# Patient Record
Sex: Female | Born: 1935 | Race: White | Hispanic: No | Smoking: Never smoker
Health system: Southern US, Community
[De-identification: ages and names within clinical notes are randomized; demographics above are authoritative.]

## PROBLEM LIST (undated history)

## (undated) DIAGNOSIS — H269 Unspecified cataract: Secondary | ICD-10-CM

## (undated) DIAGNOSIS — K118 Other diseases of salivary glands: Secondary | ICD-10-CM

## (undated) DIAGNOSIS — F32A Depression, unspecified: Secondary | ICD-10-CM

## (undated) DIAGNOSIS — I82409 Acute embolism and thrombosis of unspecified deep veins of unspecified lower extremity: Secondary | ICD-10-CM

## (undated) DIAGNOSIS — M1990 Unspecified osteoarthritis, unspecified site: Secondary | ICD-10-CM

## (undated) DIAGNOSIS — G4700 Insomnia, unspecified: Secondary | ICD-10-CM

## (undated) DIAGNOSIS — I10 Essential (primary) hypertension: Secondary | ICD-10-CM

## (undated) DIAGNOSIS — K219 Gastro-esophageal reflux disease without esophagitis: Secondary | ICD-10-CM

## (undated) DIAGNOSIS — K259 Gastric ulcer, unspecified as acute or chronic, without hemorrhage or perforation: Secondary | ICD-10-CM

## (undated) DIAGNOSIS — K559 Vascular disorder of intestine, unspecified: Secondary | ICD-10-CM

## (undated) DIAGNOSIS — J449 Chronic obstructive pulmonary disease, unspecified: Secondary | ICD-10-CM

## (undated) DIAGNOSIS — C07 Malignant neoplasm of parotid gland: Secondary | ICD-10-CM

## (undated) DIAGNOSIS — F329 Major depressive disorder, single episode, unspecified: Secondary | ICD-10-CM

## (undated) HISTORY — PX: KNEE ARTHROPLASTY: SHX992

## (undated) HISTORY — DX: Other diseases of salivary glands: K11.8

## (undated) HISTORY — DX: Insomnia, unspecified: G47.00

## (undated) HISTORY — DX: Malignant neoplasm of parotid gland: C07

## (undated) HISTORY — PX: COLONOSCOPY: SHX174

## (undated) HISTORY — PX: TOTAL HIP ARTHROPLASTY: SHX124

## (undated) HISTORY — PX: ABDOMINAL HYSTERECTOMY: SHX81

## (undated) HISTORY — PX: BACK SURGERY: SHX140

## (undated) HISTORY — DX: Unspecified cataract: H26.9

## (undated) HISTORY — PX: OTHER SURGICAL HISTORY: SHX169

## (undated) HISTORY — PX: CHOLECYSTECTOMY: SHX55

---

## 1998-01-05 ENCOUNTER — Other Ambulatory Visit: Admission: RE | Admit: 1998-01-05 | Discharge: 1998-01-05 | Payer: Self-pay | Admitting: *Deleted

## 1998-02-09 ENCOUNTER — Other Ambulatory Visit: Admission: RE | Admit: 1998-02-09 | Discharge: 1998-02-09 | Payer: Self-pay | Admitting: *Deleted

## 1998-03-07 ENCOUNTER — Other Ambulatory Visit: Admission: RE | Admit: 1998-03-07 | Discharge: 1998-03-07 | Payer: Self-pay | Admitting: *Deleted

## 1998-05-03 ENCOUNTER — Ambulatory Visit (HOSPITAL_COMMUNITY): Admission: RE | Admit: 1998-05-03 | Discharge: 1998-05-03 | Payer: Self-pay | Admitting: *Deleted

## 1998-06-23 ENCOUNTER — Ambulatory Visit (HOSPITAL_COMMUNITY): Admission: RE | Admit: 1998-06-23 | Discharge: 1998-06-23 | Payer: Self-pay | Admitting: *Deleted

## 1998-07-19 ENCOUNTER — Emergency Department (HOSPITAL_COMMUNITY): Admission: EM | Admit: 1998-07-19 | Discharge: 1998-07-19 | Payer: Self-pay | Admitting: Emergency Medicine

## 1998-11-28 ENCOUNTER — Ambulatory Visit (HOSPITAL_COMMUNITY): Admission: RE | Admit: 1998-11-28 | Discharge: 1998-11-28 | Payer: Self-pay | Admitting: *Deleted

## 1999-02-28 ENCOUNTER — Encounter: Payer: Self-pay | Admitting: Emergency Medicine

## 1999-02-28 ENCOUNTER — Emergency Department (HOSPITAL_COMMUNITY): Admission: EM | Admit: 1999-02-28 | Discharge: 1999-03-01 | Payer: Self-pay | Admitting: Emergency Medicine

## 1999-03-15 ENCOUNTER — Encounter: Admission: RE | Admit: 1999-03-15 | Discharge: 1999-04-20 | Payer: Self-pay | Admitting: Family Medicine

## 1999-06-16 ENCOUNTER — Ambulatory Visit (HOSPITAL_COMMUNITY): Admission: RE | Admit: 1999-06-16 | Discharge: 1999-06-16 | Payer: Self-pay | Admitting: *Deleted

## 1999-08-10 ENCOUNTER — Other Ambulatory Visit: Admission: RE | Admit: 1999-08-10 | Discharge: 1999-08-10 | Payer: Self-pay | Admitting: *Deleted

## 1999-10-04 ENCOUNTER — Encounter: Payer: Self-pay | Admitting: Internal Medicine

## 1999-10-04 ENCOUNTER — Emergency Department (HOSPITAL_COMMUNITY): Admission: EM | Admit: 1999-10-04 | Discharge: 1999-10-04 | Payer: Self-pay | Admitting: Internal Medicine

## 1999-10-10 ENCOUNTER — Ambulatory Visit (HOSPITAL_COMMUNITY): Admission: RE | Admit: 1999-10-10 | Discharge: 1999-10-10 | Payer: Self-pay | Admitting: *Deleted

## 1999-10-25 ENCOUNTER — Ambulatory Visit (HOSPITAL_COMMUNITY): Admission: RE | Admit: 1999-10-25 | Discharge: 1999-10-25 | Payer: Self-pay | Admitting: Internal Medicine

## 1999-10-25 ENCOUNTER — Encounter: Payer: Self-pay | Admitting: Internal Medicine

## 2000-07-10 ENCOUNTER — Ambulatory Visit (HOSPITAL_COMMUNITY): Admission: RE | Admit: 2000-07-10 | Discharge: 2000-07-10 | Payer: Self-pay | Admitting: *Deleted

## 2000-08-29 ENCOUNTER — Emergency Department (HOSPITAL_COMMUNITY): Admission: EM | Admit: 2000-08-29 | Discharge: 2000-08-29 | Payer: Self-pay | Admitting: Internal Medicine

## 2000-08-29 ENCOUNTER — Encounter: Payer: Self-pay | Admitting: Emergency Medicine

## 2001-03-09 ENCOUNTER — Emergency Department (HOSPITAL_COMMUNITY): Admission: EM | Admit: 2001-03-09 | Discharge: 2001-03-09 | Payer: Self-pay | Admitting: Emergency Medicine

## 2001-07-17 ENCOUNTER — Encounter: Payer: Self-pay | Admitting: Internal Medicine

## 2001-07-17 ENCOUNTER — Ambulatory Visit (HOSPITAL_COMMUNITY): Admission: RE | Admit: 2001-07-17 | Discharge: 2001-07-17 | Payer: Self-pay | Admitting: Internal Medicine

## 2001-07-29 ENCOUNTER — Ambulatory Visit (HOSPITAL_COMMUNITY): Admission: RE | Admit: 2001-07-29 | Discharge: 2001-07-29 | Payer: Self-pay | Admitting: Internal Medicine

## 2001-07-29 ENCOUNTER — Encounter: Payer: Self-pay | Admitting: Internal Medicine

## 2001-08-03 ENCOUNTER — Emergency Department (HOSPITAL_COMMUNITY): Admission: EM | Admit: 2001-08-03 | Discharge: 2001-08-03 | Payer: Self-pay | Admitting: Emergency Medicine

## 2001-08-03 ENCOUNTER — Encounter: Payer: Self-pay | Admitting: Emergency Medicine

## 2001-08-07 ENCOUNTER — Encounter (INDEPENDENT_AMBULATORY_CARE_PROVIDER_SITE_OTHER): Payer: Self-pay | Admitting: Specialist

## 2001-08-07 ENCOUNTER — Ambulatory Visit (HOSPITAL_COMMUNITY): Admission: RE | Admit: 2001-08-07 | Discharge: 2001-08-07 | Payer: Self-pay | Admitting: Gastroenterology

## 2001-08-07 ENCOUNTER — Encounter (INDEPENDENT_AMBULATORY_CARE_PROVIDER_SITE_OTHER): Payer: Self-pay | Admitting: *Deleted

## 2001-12-12 ENCOUNTER — Inpatient Hospital Stay (HOSPITAL_COMMUNITY): Admission: EM | Admit: 2001-12-12 | Discharge: 2001-12-16 | Payer: Self-pay

## 2001-12-14 ENCOUNTER — Encounter: Payer: Self-pay | Admitting: *Deleted

## 2002-03-25 ENCOUNTER — Encounter: Payer: Self-pay | Admitting: Gastroenterology

## 2002-03-25 ENCOUNTER — Inpatient Hospital Stay (HOSPITAL_COMMUNITY): Admission: EM | Admit: 2002-03-25 | Discharge: 2002-03-28 | Payer: Self-pay | Admitting: Emergency Medicine

## 2002-07-06 ENCOUNTER — Encounter: Admission: RE | Admit: 2002-07-06 | Discharge: 2002-07-06 | Payer: Self-pay | Admitting: *Deleted

## 2002-07-06 ENCOUNTER — Encounter: Payer: Self-pay | Admitting: *Deleted

## 2002-08-03 ENCOUNTER — Ambulatory Visit (HOSPITAL_COMMUNITY): Admission: RE | Admit: 2002-08-03 | Discharge: 2002-08-03 | Payer: Self-pay | Admitting: *Deleted

## 2002-08-03 ENCOUNTER — Encounter: Admission: RE | Admit: 2002-08-03 | Discharge: 2002-08-03 | Payer: Self-pay | Admitting: *Deleted

## 2002-08-03 ENCOUNTER — Encounter: Payer: Self-pay | Admitting: *Deleted

## 2002-09-02 ENCOUNTER — Ambulatory Visit (HOSPITAL_COMMUNITY): Admission: RE | Admit: 2002-09-02 | Discharge: 2002-09-02 | Payer: Self-pay | Admitting: *Deleted

## 2002-09-07 ENCOUNTER — Ambulatory Visit (HOSPITAL_COMMUNITY): Admission: RE | Admit: 2002-09-07 | Discharge: 2002-09-07 | Payer: Self-pay | Admitting: *Deleted

## 2002-09-07 ENCOUNTER — Encounter: Admission: RE | Admit: 2002-09-07 | Discharge: 2002-09-07 | Payer: Self-pay | Admitting: *Deleted

## 2002-09-07 ENCOUNTER — Encounter: Payer: Self-pay | Admitting: *Deleted

## 2003-06-03 ENCOUNTER — Encounter: Admission: RE | Admit: 2003-06-03 | Discharge: 2003-06-03 | Payer: Self-pay | Admitting: Orthopaedic Surgery

## 2003-06-15 ENCOUNTER — Encounter: Admission: RE | Admit: 2003-06-15 | Discharge: 2003-08-10 | Payer: Self-pay | Admitting: Orthopaedic Surgery

## 2003-07-09 ENCOUNTER — Emergency Department (HOSPITAL_COMMUNITY): Admission: EM | Admit: 2003-07-09 | Discharge: 2003-07-09 | Payer: Self-pay | Admitting: Emergency Medicine

## 2003-07-20 ENCOUNTER — Ambulatory Visit (HOSPITAL_COMMUNITY): Admission: RE | Admit: 2003-07-20 | Discharge: 2003-07-20 | Payer: Self-pay | Admitting: Internal Medicine

## 2003-08-30 ENCOUNTER — Emergency Department (HOSPITAL_COMMUNITY): Admission: EM | Admit: 2003-08-30 | Discharge: 2003-08-31 | Payer: Self-pay | Admitting: Emergency Medicine

## 2004-05-07 ENCOUNTER — Emergency Department (HOSPITAL_COMMUNITY): Admission: EM | Admit: 2004-05-07 | Discharge: 2004-05-07 | Payer: Self-pay | Admitting: Emergency Medicine

## 2004-07-03 ENCOUNTER — Ambulatory Visit (HOSPITAL_COMMUNITY): Admission: RE | Admit: 2004-07-03 | Discharge: 2004-07-03 | Payer: Self-pay | Admitting: Internal Medicine

## 2004-11-29 ENCOUNTER — Emergency Department (HOSPITAL_COMMUNITY): Admission: EM | Admit: 2004-11-29 | Discharge: 2004-11-29 | Payer: Self-pay | Admitting: Emergency Medicine

## 2004-12-01 ENCOUNTER — Inpatient Hospital Stay (HOSPITAL_COMMUNITY): Admission: EM | Admit: 2004-12-01 | Discharge: 2004-12-06 | Payer: Self-pay | Admitting: Emergency Medicine

## 2004-12-26 ENCOUNTER — Encounter: Admission: RE | Admit: 2004-12-26 | Discharge: 2004-12-26 | Payer: Self-pay | Admitting: Gastroenterology

## 2005-04-22 ENCOUNTER — Emergency Department (HOSPITAL_COMMUNITY): Admission: EM | Admit: 2005-04-22 | Discharge: 2005-04-22 | Payer: Self-pay | Admitting: *Deleted

## 2005-04-26 ENCOUNTER — Emergency Department (HOSPITAL_COMMUNITY): Admission: EM | Admit: 2005-04-26 | Discharge: 2005-04-26 | Payer: Self-pay | Admitting: Emergency Medicine

## 2005-05-02 ENCOUNTER — Emergency Department (HOSPITAL_COMMUNITY): Admission: EM | Admit: 2005-05-02 | Discharge: 2005-05-02 | Payer: Self-pay | Admitting: Emergency Medicine

## 2005-09-17 ENCOUNTER — Emergency Department (HOSPITAL_COMMUNITY): Admission: EM | Admit: 2005-09-17 | Discharge: 2005-09-17 | Payer: Self-pay | Admitting: Emergency Medicine

## 2005-11-27 ENCOUNTER — Encounter: Payer: Self-pay | Admitting: Emergency Medicine

## 2005-11-28 ENCOUNTER — Ambulatory Visit: Payer: Self-pay | Admitting: Cardiology

## 2005-11-28 ENCOUNTER — Inpatient Hospital Stay (HOSPITAL_COMMUNITY): Admission: EM | Admit: 2005-11-28 | Discharge: 2005-12-04 | Payer: Self-pay

## 2005-11-29 ENCOUNTER — Encounter: Payer: Self-pay | Admitting: Cardiology

## 2006-06-04 ENCOUNTER — Ambulatory Visit (HOSPITAL_COMMUNITY): Admission: RE | Admit: 2006-06-04 | Discharge: 2006-06-04 | Payer: Self-pay | Admitting: Internal Medicine

## 2006-06-04 ENCOUNTER — Encounter: Payer: Self-pay | Admitting: Vascular Surgery

## 2006-11-18 ENCOUNTER — Emergency Department (HOSPITAL_COMMUNITY): Admission: EM | Admit: 2006-11-18 | Discharge: 2006-11-18 | Payer: Self-pay | Admitting: Emergency Medicine

## 2007-01-07 ENCOUNTER — Encounter: Admission: RE | Admit: 2007-01-07 | Discharge: 2007-02-03 | Payer: Self-pay | Admitting: Orthopaedic Surgery

## 2007-02-04 ENCOUNTER — Encounter: Admission: RE | Admit: 2007-02-04 | Discharge: 2007-03-10 | Payer: Self-pay | Admitting: Orthopaedic Surgery

## 2007-03-11 ENCOUNTER — Encounter: Admission: RE | Admit: 2007-03-11 | Discharge: 2007-04-17 | Payer: Self-pay | Admitting: Orthopaedic Surgery

## 2007-10-12 ENCOUNTER — Ambulatory Visit: Payer: Self-pay | Admitting: Cardiology

## 2007-10-13 ENCOUNTER — Ambulatory Visit: Payer: Self-pay | Admitting: Cardiology

## 2007-10-13 ENCOUNTER — Inpatient Hospital Stay (HOSPITAL_COMMUNITY): Admission: EM | Admit: 2007-10-13 | Discharge: 2007-10-17 | Payer: Self-pay | Admitting: *Deleted

## 2007-10-16 ENCOUNTER — Encounter (INDEPENDENT_AMBULATORY_CARE_PROVIDER_SITE_OTHER): Payer: Self-pay | Admitting: Internal Medicine

## 2007-10-17 ENCOUNTER — Encounter (INDEPENDENT_AMBULATORY_CARE_PROVIDER_SITE_OTHER): Payer: Self-pay | Admitting: *Deleted

## 2007-10-17 HISTORY — PX: ESOPHAGOGASTRODUODENOSCOPY: SHX1529

## 2007-10-27 ENCOUNTER — Ambulatory Visit: Payer: Self-pay

## 2007-11-06 ENCOUNTER — Ambulatory Visit: Payer: Self-pay | Admitting: Cardiology

## 2007-11-27 ENCOUNTER — Emergency Department (HOSPITAL_COMMUNITY): Admission: EM | Admit: 2007-11-27 | Discharge: 2007-11-28 | Payer: Self-pay | Admitting: Emergency Medicine

## 2008-03-23 ENCOUNTER — Ambulatory Visit: Payer: Self-pay | Admitting: Cardiology

## 2008-03-29 ENCOUNTER — Encounter: Admission: RE | Admit: 2008-03-29 | Discharge: 2008-06-27 | Payer: Self-pay | Admitting: Internal Medicine

## 2008-06-21 ENCOUNTER — Encounter (INDEPENDENT_AMBULATORY_CARE_PROVIDER_SITE_OTHER): Payer: Self-pay | Admitting: Internal Medicine

## 2008-06-21 ENCOUNTER — Observation Stay (HOSPITAL_COMMUNITY): Admission: EM | Admit: 2008-06-21 | Discharge: 2008-06-28 | Payer: Self-pay | Admitting: Emergency Medicine

## 2008-06-22 ENCOUNTER — Ambulatory Visit: Payer: Self-pay | Admitting: Vascular Surgery

## 2008-06-22 ENCOUNTER — Encounter (INDEPENDENT_AMBULATORY_CARE_PROVIDER_SITE_OTHER): Payer: Self-pay | Admitting: Internal Medicine

## 2008-07-14 ENCOUNTER — Emergency Department (HOSPITAL_COMMUNITY): Admission: EM | Admit: 2008-07-14 | Discharge: 2008-07-14 | Payer: Self-pay | Admitting: Emergency Medicine

## 2008-07-14 ENCOUNTER — Encounter (INDEPENDENT_AMBULATORY_CARE_PROVIDER_SITE_OTHER): Payer: Self-pay | Admitting: Emergency Medicine

## 2008-07-14 ENCOUNTER — Ambulatory Visit: Payer: Self-pay | Admitting: Vascular Surgery

## 2008-11-12 ENCOUNTER — Ambulatory Visit: Payer: Self-pay | Admitting: Cardiology

## 2008-11-12 LAB — CONVERTED CEMR LAB
BUN: 10 mg/dL (ref 6–23)
Calcium: 9 mg/dL (ref 8.4–10.5)
Creatinine, Ser: 0.7 mg/dL (ref 0.4–1.2)
GFR calc Af Amer: 103 mL/min

## 2009-01-06 DIAGNOSIS — F339 Major depressive disorder, recurrent, unspecified: Secondary | ICD-10-CM

## 2009-01-06 DIAGNOSIS — K589 Irritable bowel syndrome without diarrhea: Secondary | ICD-10-CM

## 2009-01-06 DIAGNOSIS — M1990 Unspecified osteoarthritis, unspecified site: Secondary | ICD-10-CM

## 2009-01-06 DIAGNOSIS — K219 Gastro-esophageal reflux disease without esophagitis: Secondary | ICD-10-CM | POA: Insufficient documentation

## 2009-01-06 DIAGNOSIS — J449 Chronic obstructive pulmonary disease, unspecified: Secondary | ICD-10-CM

## 2009-01-06 DIAGNOSIS — I10 Essential (primary) hypertension: Secondary | ICD-10-CM | POA: Insufficient documentation

## 2009-01-06 DIAGNOSIS — D649 Anemia, unspecified: Secondary | ICD-10-CM

## 2009-01-10 ENCOUNTER — Ambulatory Visit: Payer: Self-pay | Admitting: Cardiology

## 2009-01-10 ENCOUNTER — Encounter: Payer: Self-pay | Admitting: Cardiology

## 2009-05-31 ENCOUNTER — Encounter (INDEPENDENT_AMBULATORY_CARE_PROVIDER_SITE_OTHER): Payer: Self-pay | Admitting: *Deleted

## 2009-05-31 ENCOUNTER — Ambulatory Visit (HOSPITAL_COMMUNITY): Admission: RE | Admit: 2009-05-31 | Discharge: 2009-05-31 | Payer: Self-pay | Admitting: Otolaryngology

## 2009-07-19 ENCOUNTER — Ambulatory Visit: Payer: Self-pay | Admitting: Gastroenterology

## 2009-10-15 ENCOUNTER — Emergency Department (HOSPITAL_COMMUNITY): Admission: EM | Admit: 2009-10-15 | Discharge: 2009-10-15 | Payer: Self-pay | Admitting: Emergency Medicine

## 2009-10-19 ENCOUNTER — Ambulatory Visit (HOSPITAL_COMMUNITY): Admission: RE | Admit: 2009-10-19 | Discharge: 2009-10-19 | Payer: Self-pay | Admitting: Emergency Medicine

## 2010-04-03 ENCOUNTER — Ambulatory Visit: Payer: Self-pay | Admitting: Cardiology

## 2010-10-14 ENCOUNTER — Encounter: Payer: Self-pay | Admitting: Gastroenterology

## 2010-10-15 ENCOUNTER — Encounter: Payer: Self-pay | Admitting: Internal Medicine

## 2010-10-15 ENCOUNTER — Encounter: Payer: Self-pay | Admitting: Emergency Medicine

## 2010-11-29 ENCOUNTER — Other Ambulatory Visit (HOSPITAL_COMMUNITY): Payer: Self-pay | Admitting: Internal Medicine

## 2010-11-29 ENCOUNTER — Ambulatory Visit (HOSPITAL_COMMUNITY)
Admission: RE | Admit: 2010-11-29 | Discharge: 2010-11-29 | Disposition: A | Discharge status: Home / Self Care | Payer: Medicare Other | Source: Ambulatory Visit | Attending: Internal Medicine | Admitting: Internal Medicine

## 2010-11-29 DIAGNOSIS — R05 Cough: Secondary | ICD-10-CM

## 2010-11-29 DIAGNOSIS — W19XXXA Unspecified fall, initial encounter: Secondary | ICD-10-CM | POA: Insufficient documentation

## 2010-11-29 DIAGNOSIS — R079 Chest pain, unspecified: Secondary | ICD-10-CM | POA: Insufficient documentation

## 2010-11-29 DIAGNOSIS — R059 Cough, unspecified: Secondary | ICD-10-CM | POA: Insufficient documentation

## 2010-12-10 LAB — PROTIME-INR: Prothrombin Time: 20.2 seconds — ABNORMAL HIGH (ref 11.6–15.2)

## 2010-12-20 ENCOUNTER — Emergency Department (HOSPITAL_COMMUNITY): Payer: Medicare Other

## 2010-12-20 ENCOUNTER — Emergency Department (HOSPITAL_COMMUNITY)
Admission: EM | Admit: 2010-12-20 | Discharge: 2010-12-20 | Disposition: A | Discharge status: Home / Self Care | Payer: Medicare Other | Attending: Emergency Medicine | Admitting: Emergency Medicine

## 2010-12-20 DIAGNOSIS — Y92009 Unspecified place in unspecified non-institutional (private) residence as the place of occurrence of the external cause: Secondary | ICD-10-CM | POA: Insufficient documentation

## 2010-12-20 DIAGNOSIS — S0100XA Unspecified open wound of scalp, initial encounter: Secondary | ICD-10-CM | POA: Insufficient documentation

## 2010-12-20 DIAGNOSIS — W010XXA Fall on same level from slipping, tripping and stumbling without subsequent striking against object, initial encounter: Secondary | ICD-10-CM | POA: Insufficient documentation

## 2010-12-20 DIAGNOSIS — S0990XA Unspecified injury of head, initial encounter: Secondary | ICD-10-CM | POA: Insufficient documentation

## 2010-12-20 DIAGNOSIS — T45515A Adverse effect of anticoagulants, initial encounter: Secondary | ICD-10-CM | POA: Insufficient documentation

## 2010-12-20 DIAGNOSIS — M542 Cervicalgia: Secondary | ICD-10-CM | POA: Insufficient documentation

## 2010-12-20 DIAGNOSIS — M549 Dorsalgia, unspecified: Secondary | ICD-10-CM | POA: Insufficient documentation

## 2010-12-20 DIAGNOSIS — R791 Abnormal coagulation profile: Secondary | ICD-10-CM | POA: Insufficient documentation

## 2010-12-20 DIAGNOSIS — T1490XA Injury, unspecified, initial encounter: Secondary | ICD-10-CM | POA: Insufficient documentation

## 2010-12-20 DIAGNOSIS — Z86718 Personal history of other venous thrombosis and embolism: Secondary | ICD-10-CM | POA: Insufficient documentation

## 2010-12-20 DIAGNOSIS — Z7901 Long term (current) use of anticoagulants: Secondary | ICD-10-CM | POA: Insufficient documentation

## 2010-12-20 DIAGNOSIS — M47812 Spondylosis without myelopathy or radiculopathy, cervical region: Secondary | ICD-10-CM | POA: Insufficient documentation

## 2010-12-20 DIAGNOSIS — I10 Essential (primary) hypertension: Secondary | ICD-10-CM | POA: Insufficient documentation

## 2010-12-20 DIAGNOSIS — M79609 Pain in unspecified limb: Secondary | ICD-10-CM | POA: Insufficient documentation

## 2010-12-20 LAB — DIFFERENTIAL
Basophils Relative: 0 % (ref 0–1)
Eosinophils Absolute: 0.1 10*3/uL (ref 0.0–0.7)
Lymphs Abs: 0.9 10*3/uL (ref 0.7–4.0)
Neutro Abs: 4.9 10*3/uL (ref 1.7–7.7)
Neutrophils Relative %: 69 % (ref 43–77)

## 2010-12-20 LAB — CBC
Hemoglobin: 10.6 g/dL — ABNORMAL LOW (ref 12.0–15.0)
MCV: 89.2 fL (ref 78.0–100.0)
Platelets: 315 10*3/uL (ref 150–400)
RBC: 3.7 MIL/uL — ABNORMAL LOW (ref 3.87–5.11)
WBC: 7.1 10*3/uL (ref 4.0–10.5)

## 2010-12-20 LAB — BASIC METABOLIC PANEL
Calcium: 8.7 mg/dL (ref 8.4–10.5)
Creatinine, Ser: 0.93 mg/dL (ref 0.4–1.2)
GFR calc Af Amer: >60 mL/min (ref >60)
Sodium: 139 mEq/L (ref 135–145)

## 2010-12-20 LAB — APTT: aPTT: 96 seconds — ABNORMAL HIGH (ref 24–37)

## 2010-12-20 LAB — PROTIME-INR: Prothrombin Time: 44.8 seconds — ABNORMAL HIGH (ref 11.6–15.2)

## 2011-02-06 NOTE — Assessment & Plan Note (Signed)
[facility]                            CARDIOLOGY OFFICE NOTE   NAME:[name], [name] LAGACE                   MRN:          [id number]  DATE:03/23/2008                            DOB:          09/25/1924    [name] [name] is in for followup visit.  In general, she is stable.  The  patient underwent myocardial perfusion imaging back in February 2009.  This particular study was read as a low-risk nuclear study with a defect  in the anterior wall suggesting some breast attenuation.  Fortunately,  she has not had any recurrent chest pain.  She was originally admitted,  and had ischemic colitis.  She has not had any recurrent symptoms since  I last saw her.  She is not felt to be a Coumadin candidate.   Her current medications include benazepril 20 mg daily, multivitamin,  fish oil, Macular eyes, amitriptyline, omeprazole 20 mg daily,  ropinirole and meloxicam.   On physical; blood pressure is 144/67, pulse 81, lung fields are clear,  and the cardiac exam is, otherwise, unremarkable.   Importantly, the patient does have a history of ischemic colitis and  DVT.  She is on a nonsteroidal and we discussed this briefly.  We will  continue her on current medical regimen and I will see her back in  followup 6 months unless further problems arise.       Arturo Morton. Riley Kill, MD, [facility]  Electronically Signed    TDS/MedQ  DD: 04/09/2008  DT: 04/10/2008  Job #: [id number]

## 2011-02-06 NOTE — Consult Note (Signed)
NAMEADDISYNN, [name] [name] NO.:  [id number]   MEDICAL RECORD NO.:  [id number]          PATIENT TYPE:  INP   LOCATION:  5039                         FACILITY:  [facility]   PHYSICIAN:  Danise Edge, M.D.   DATE OF BIRTH:  06-07-1925   DATE OF CONSULTATION:  10/13/2007  DATE OF DISCHARGE:                                 CONSULTATION   ADMISSION DIAGNOSIS:  Hematochezia with left lower quadrant abdominal  pain suspicious for ischemic colitis.   HISTORY:  Ms. [name] [name] is an 77 year old female born July 24, 1925.  Ms. [name] [name] was admitted to [facility] October 12, 2007  following the acute onset of hematochezia with predominantly left lower  quadrant abdominal discomfort.  Prior to the episode, she had been  feeling well.  She denies severe abdominal pain.   Ms. [name] [name] has an allergy to LATEX and it would be hazardous to  undergo a CT scan of her abdomen and pelvis.   She does have a history of ischemic colitis which healed without  surgery.  She has undergone colonoscopic exams in 1999 and in 2002.   Ms. [name] [name] was placed on antibiotics for suspected ischemic colitis.  Her bleeding has stopped.  There has been no significant fall in her  hemoglobin which was approximately 11.5 grams on admission.   MEDICATION ALLERGIES:  1. PENICILLIN.  2. LATEX.  3. CONTRAST.   PAST MEDICAL/SURGICAL HISTORY:  1. 2007 placement of inferior vena cava filter due to deep venous      thrombosis.  2. Hypertension.  3. Normocytic anemia.  4. Osteoarthritis.  5. Chronic obstructive pulmonary disease.  6. Gastroesophageal reflux disease.  7. Depression.  8. Irritable bowel syndrome.  9. History of ischemic colitis.  10.History of stroke.  11.Cataract surgery.  12.Lumbar laminectomy.  13.Bilateral hip replacement surgeries.  14.Bilateral knee replacement surgeries.  15.Hysterectomy.  16.Cholecystectomy.   HABITS:  Ms. [name] [name] does not smoke cigarettes or  consume alcohol.   RECOMMENDATIONS:  I agree with Ms. [name] [name] current therapy which  includes stopping the aspirin and Plavix and placing her on  ciprofloxacin and Flagyl for suspected ischemic colitis.  I do not  recommend colonoscopy or diagnostic flexible proctosigmoidoscopy due to  the increased risk of perforation.           ______________________________  Danise Edge, M.D.     MJ/MEDQ  D:  10/13/2007  T:  10/13/2007  Job:  [id number]

## 2011-02-06 NOTE — H&P (Signed)
[name] [name], [name] NO.:  [id number]   MEDICAL RECORD NO.:  [id number]          PATIENT TYPE:  INP   LOCATION:  5039                         FACILITY:  [facility]   PHYSICIAN:  [name] [name], M.D.   DATE OF BIRTH:  Mar 18, 1925   DATE OF ADMISSION:  10/12/2007  DATE OF DISCHARGE:                              HISTORY & PHYSICAL   PRIMARY CARE PHYSICIAN:  [name] [name], D.O.  Gastroenterologist, Dr.  Randa [name].   CHIEF COMPLAINT:  Bloody stools.   HISTORY OF PRESENT ILLNESS:  The patient is an 77 year old female with  past medical history of ischemic colitis and multiple bouts of  intermittent hematochezia who presents to the emergency department today  with a 24-hour history of recurrent bloody stools.  The patient states  that she thinks that over the past 24 hours, she has lost two gallons of  blood in her stool.  She also complained of left lower quadrant  abdominal pain and abdominal cramping prior to evacuating her bowels.  She denies any fever or chills.  Appetite has been normal.  She is being  admitted for further evaluation and workup.   PAST MEDICAL HISTORY:  1. Deep venous thrombosis with history of IVC filter placement in      March 2007.  2. Hypertension.  3. Normocytic anemia.  4. Osteoarthritis.  5. Chronic obstructive pulmonary disease.  6. Gastroesophageal reflux disease.  7. Depression.  8. Irritable bowel syndrome.  9. Ischemic colitis.  10.History of stroke.   PAST SURGICAL HISTORY:  1. Cataract surgery.  2. Status post lumbar laminectomy.  3. Bilateral hip replacements.  4. Bilateral knee replacements.  5. Hysterectomy.  6. Cholecystectomy.   FAMILY HISTORY:  The patient's mother and father both died in their 63s  from old age.  Father had pneumonia at his time of death.  She has a  sister who died of colon cancer.  She has three healthy offspring.   SOCIAL HISTORY:  The patient is widowed and lives alone.  She does have  an  active Life Alert system.  She is a lifelong nonsmoker.  She denies  alcohol.  She was a homemaker.   ALLERGIES:  PENICILLIN AND LATEX.   MEDICATIONS:  1. Darvocet-N 100 1 tablet q.4 h. p.r.n.  2. Benazepril 20 mg daily.  3. Aspirin 325 mg daily.  4. Amitriptyline 225 mg nightly.  5. Mobic 7.5 mg b.i.d.  6. Hydroxyzine 25 mg q.6 h. p.r.n.  7. Cyclobenzaprine 10 mg q.6 h. p.r.n.  8. Temazepam 30 mg nightly p.r.n.   REVIEW OF SYSTEMS:  The patient reports occasional chest pain.  She  denies shortness of breath or cough.  She denies dysuria.  Otherwise, as  noted in the elements of the HPI above.  No nausea or vomiting.   PHYSICAL EXAMINATION:  VITAL SIGNS:  Temperature 99.1, pulse 90,  respirations 18, blood pressure 103/54, O2 saturation 95% on 2 L.  GENERAL:  Well-developed, well-nourished, obese female in no acute  distress.  HEENT:  Normocephalic, atraumatic.  PERRL.  EOMI.  Oropharynx is clear  with dry mucous membranes and  dentures to the upper palate.  NECK:  Supple, no thyromegaly, no lymphadenopathy, no jugular venous  distention.  CHEST:  Lungs clear to auscultation bilaterally with good air movement.  HEART:  Regular rate, rhythm.  There is a soft grade 2/6 systolic  ejection murmur at the left upper sternal border.  ABDOMEN:  Soft, tender in the left lower quadrant and flank.  No rebound  or guarding.  RECTAL:  Done by ED physician, reportedly heme-positive stool.  EXTREMITIES:  No clubbing, edema, cyanosis.  She does have TED hose on.  SKIN:  Warm and dry.  No rashes.  NEUROLOGIC:  The patient is alert and oriented x3.  Cranial nerves 2-12  grossly intact with exception of some mild hearing impairment.  Moves  all extremities x4 with equal strength.   LABORATORY DATA AND X-RAY FINDINGS:  Sodium is 136, potassium 4.3,  chloride 100, bicarb 29, BUN 16, creatinine 0.94, glucose 113.  Total  bilirubin 0.8, alkaline phosphatase 115, AST 25, ALT less than 8, total   protein 6, albumin 3.2.  Coagulation studies are normal.  White blood  cell count 14.1, hemoglobin 11.8, hematocrit 34.8, platelets 238 with an  MCV of 94 and absolute neutrophil count of 11.   ASSESSMENT/PLAN:  1. Hematochezia secondary to ischemic colitis flare.  The patient's      lower gastrointestinal bleed is likely from recurrent ischemic      colitis.  Will admit the patient and monitor her hemoglobin and      hematocrit q.8 h. x3.  Will type and cross and give packed red      blood cells as needed for any significant drop in her hemoglobin.      This time, I will hold her aspirin and Mobic.  Given her relative      hypotension, I will hold her ACE inhibitor as well.  Given her      leukocytosis, I will empirically cover her with Cipro and Flagyl.  2. History of deep venous thrombosis.  We will place the patient on      PAS hose.  3. Hypertension:  The patient is actually mildly hypotensive and      therefore, we will hold her antihypertensive medication.  4. Osteoarthritis.  Will administer pain medications as needed.  5. Prophylaxis.  Administer a proton pump inhibitor for      gastrointestinal prophylaxis and use PAS hose for deep venous      thrombosis prophylaxis.      [name] [name], M.D.  Electronically Signed     CR/MEDQ  D:  10/13/2007  T:  10/13/2007  Job:  [id number]   cc:   [name] [name]., M.D.  [name] [name], D.O.

## 2011-02-06 NOTE — H&P (Signed)
NAME:  [name] [name], [name] [name] NO.:  [id number]   MEDICAL RECORD NO.:  [id number]          PATIENT TYPE:  OBV   LOCATION:  1831                         FACILITY:  [facility]   PHYSICIAN:  Ladell Pier, M.D.   DATE OF BIRTH:  [birth year]   DATE OF ADMISSION:  06/21/2008  DATE OF DISCHARGE:                              HISTORY & PHYSICAL   CHIEF COMPLAINT:  She has severe pain in both hips, both knees, and her  calf, mostly on the right to the point that she is not able to walk.  She feels that she hurts all over, her shoulders hurt as well,  especially the right.   HISTORY OF PRESENT ILLNESS:  The patient is an 77 year old white female,  past medical history significant for arthritis, hypertension, normocytic  anemia, depression, GERD, recent admission with ischemic colitis.  The  patient was brought to the emergency room by her family secondary to  weakness.  She states that she has been having weakness in both legs.  She stated that both hips hurt and she can barely walk secondary to  severe pain in her hips and her knees.  She has had both hips and both  knees replaced.  She states she also has back pain but she has had the  back pain for 40 years and it is about the same.  She complains of no  chest pain, no shortness of breath, no headache, no neck pain.  She said  normally she had some pain, but over the years it has gotten  progressively worse to the point that she could barely walk now.  She  lives alone.   PAST MEDICAL HISTORY:  Significant for:  1. Hypertension.  2. Normocytic anemia.  3. Osteoarthritis.  4. Chronic obstructive pulmonary disease.  5. GERD.  6. Depression.  7. Irritable bowel syndrome.  8. Ischemic colitis in January 2009.  9. History of stroke.  10.History of DVT with history of IVC filter on the right in March      2007.   PAST SURGICAL HISTORY:  1. Cataract surgery.  2. Status post lumbar laminectomy.  3. Bilateral hip replacement.  4.  Bilateral knee replacement.  5. Hysterectomy.  6. Cholecystectomy.   FAMILY HISTORY:  Both parents died from old age.   SOCIAL HISTORY:  She is a widow.  She lives alone.  She does not smoke.  No alcohol use.  She was a homemaker.  She has three grown children, she  said, in their 35s.   MEDICATIONS:  The patient states that her family took her medications  home and she is not sure what medications they are.  Per the list from  the documentation of the ER:  1. Aspirin 325 mg daily.  2. Amitriptyline 225 mg at bedtime.  3. Hydroxyzine 25 mg four times daily as needed.  4. Cyclobenzaprine 10 mg every 6 hours as needed.  5. Temazepam 30 mg at bedtime as needed.  6. Requip 1 mg daily.  7. Omeprazole 20 mg daily.  8. Tramadol 50-100 mg as needed.  9. Meloxicam 7.5 mg twice daily.  10.Amlodipine 5 mg daily.   ALLERGIES:  PENICILLIN.   REVIEW OF SYSTEMS:  Negative, otherwise stated in the HPI.   PHYSICAL EXAMINATION:  VITAL SIGNS:  Temperature 97.0, blood pressure  128/65, pulse of 72, respirations 20, pulse ox 94% on room air.  HEENT: Normocephalic, atraumatic.  Pupils equal, round, and reactive to  light, throat without erythema.  CARDIOVASCULAR:  Regular rate and rhythm.  LUNGS:  Clear bilaterally.  ABDOMEN:  Abdomen soft, nontender, nondistended.  Positive bowel sounds.  EXTREMITIES: She has a 1+ to 2+ pitting edema bilaterally.  She is  tender in the right calf. Strength is symmetric 5/5 throughout.  NEURO: Cranial nerves II-XII seem to be intact.  The patient does wear  dentures and she does not have her bottom dentures in so her speech is a  little muffled secondary to that, but no facial asymmetry.  Her finger-  to-nose intact.   LABORATORY DATA:  WBC 9.8, hemoglobin 11.4, MCV 93.3, platelets of 192.  Sodium 134, potassium 4.2, chloride 100, CO2 26, glucose 114, BUN 14,  creatinine 0.79, calcium 9.4, albumin 3.7, AST 27.  UA large leukocyte  with 7-10 WBCs, few  bacteria.   X-ray of the pelvis shows no fracture, and x-ray of the right shoulder  no significant fracture, just arthritis.  Chest x-ray showed chronic  eventration of the right hemidiaphragm, no acute findings.   ASSESSMENT AND PLAN:  1. Generalized weakness.  2. Bilateral hip and knee pain.  3. Right calf pain.  4. Osteoarthritis.  5. Hypertension.  6. Normocytic anemia.  7. Depression.  8. Gastroesophageal reflux disease.  9. Pyuria.   We will admit the patient to the hospital.  We will get Doppler  ultrasound of bilateral lower extremity.  Will also get an ABI.  Will  get an x-ray of the LS spine and will control her pain with medication.  The patient was also consult social work.  She may need placement if  everything turns out to be fine.  Will consult physical  therapy/occupational therapy.  The patient lives alone and maybe the  reason for the pain is just an increase in the severity of her arthritis  with her age, but we will treat her for urinary tract infection with  Cipro.  Will check a urine culture and will get all the studies.  She  does not have any chest pain or shortness of breath, but will cycle her  enzymes and check an EKG secondary to her complaints of weakness.  She  recently saw her cardiologist back in June.      Ladell Pier, M.D.  Electronically Signed     NJ/MEDQ  D:  06/21/2008  T:  06/21/2008  Job:  [id number]   cc:   Lovenia Kim, D.O.

## 2011-02-06 NOTE — Letter (Signed)
November 06, 2007    [name] [name], D.O.  [address]  [place], [place] [address]   RE:  [name], [name]  MRN:  [id number]  /  DOB:  September 16, 1925   Dear [name] [name]:   I had the pleasure seeing [name] [name] in the office today in follow-  up visit.  She was hospitalized recently, as you know.  She was thought  to present with ischemic colitis.  She had had some intermittent  hematochezia, and I think this accounts for why her antiplatelet agents  were held.  She also had gastric erosions noted.  She was treated  medically.  Since discharge from the hospital she has been getting along  reasonably well and she does tell me there are laboratory studies were  checked in your office.  Currently, she is feeling better.  She is  getting around.  She did undergo myocardial perfusion imaging study that  was done with adenosine.  This was felt to be a relatively low risk  stress nuclear study with a mild reversible defect in the anterior wall  suggestive of shifting breast attenuation.  Mild ischemia could not be  excluded.  Ejection fraction was calculated 65%.   Her current medications include benazepril 20 mg daily, amitriptyline 25  mg q.h.s. Protonix 40 mg b.i.d., multivitamin daily and fish oil 1200 mg  a day.   EXAM:  She is well in appearance.  Blood pressure is 138/60 the pulse was 80.  The lung fields are clear.  The cardiac rhythm is regular.   The recent EKG at rest prior to her radionuclide imaging study was  completely within normal limits.   To summarize, she seems to be doing better.  Much of her chest  discomfort has completely resolved.  She denies any ongoing symptoms at  the present time.  Given her age, and low-risk ischemic study, my  inclination would be to continue to treat her medically.  I will defer  the reinstitution of Plavix to your discretion since her laboratory  studies have been obtained in your office and you have closer follow-up  with  her.  We will plan to see her back in follow-up in 3 months.  If  she should have increasing chest pain, please do not hesitate to let us  know.  At some point, consideration of cardiac catheterization might be  warranted, but in the absence of symptoms with a low risk nuclear study,  my inclination would be to leave this alone at the present time.  Finally, I am aware that she has an indwelling IVC filter.  At the time  of her hospitalization she was not deemed to be a Coumadin candidate so  I would certainly leave this to your discretion.    Sincerely,      [name] [name]. [name] Kill, MD, [facility]  Electronically Signed    TDS/MedQ  DD: 11/06/2007  DT: 11/07/2007  Job #: [id number]

## 2011-02-06 NOTE — Discharge Summary (Signed)
NAMEEARLENE, [name] [name] NO.:  [id number]   MEDICAL RECORD NO.:  [id number]          PATIENT TYPE:  INP   LOCATION:  5039                         FACILITY:  [facility]   PHYSICIAN:  Isidor Holts, M.D.  DATE OF BIRTH:  1925-01-13   DATE OF ADMISSION:  10/12/2007  DATE OF DISCHARGE:  10/17/2007                               DISCHARGE SUMMARY   PMD:  Dr. Marisue Brooklyn.   PRIMARY GASTROENTEROLOGIST:  Dr. Randa Evens.   DISCHARGE DIAGNOSES:  1. Ischemic colitis.  2. Atypical chest pain.  3. Gastric erosion/gastroesophageal reflux disease.  4. History of chronic obstructive pulmonary disease.  5. Hypertension.  6. History of depression.  7. Osteoarthritis.  8. History of previous deep venous thromboses status post IVC filter      March 2007.  9. History of irritable bowel syndrome.   DISCHARGE MEDICATIONS:  1. Darvocet-N 100 one p.o. p.r.n. q.4-6 h.  2. Benazepril 10 mg daily.  3. Amitriptyline 225 mg p.o. q.h.s.  4. Protonix 40 mg twice daily.  5. Cyclobenzaprine 10 mg p.o. p.r.n. q.6 h.  6. Temazepam 30 mg p.o. p.r.n. q.h.s.  7. Hydroxyzine 25 mg p.o. p.r.n. q.6 h.   Note:  Aspirin and Mobic have been discontinued, until reevaluated by  primary M.D.   PROCEDURE:  1. Abdominal/chest x-ray dated October 13, 2007.  This was low volume      chest film with vascular crowding and atelectasis.  Findings      suggest small bowel obstruction, no free air.  2. 2D echocardiogram dated October 16, 2007.  This showed overall      moderately decreased left ventricular systolic function.  EF was      50%, severe hypokinesis to akinesis of the inferoposterior wall .      There was mild fibrocalcific change of aortic root, mild mitral      annular calcification, mild mitral regurgitation.  Left atrium was      mildly dilated, estimated peak right ventricle systolic pressure      was slightly increased.  Inferior vena cava was dilated.  3. EGD done October 17, 2007 that showed  isolated erosion at      gastroesophageal junction (40 cm). Stomach normal.  Duodenum      normal.   CONSULTATIONS:  1. Dr. Riley Kill, cardiologist.  2. Dr. Laural Benes, gastroenterologist.   ADMISSION HISTORY:  As H&P notes on October 12, 2007 dictated by Dr.  Hillery Aldo.  However, in brief, this is an 77 year old female, with  known history of DVT status post IVC filter March 2007, hypertension,  normocytic anemia, osteoarthritis, COPD, gastroesophageal reflux  disease, depression, irritable bowel syndrome, prior history of ischemic  colitis, prior history of stroke, status post bilateral hip and knee  replacement, status post lumbar laminectomy, status post hysterectomy  and cholecystectomy as well as cataract surgery who presents with a 24-  hour history of recurrent bloody stools.  She was admitted for  evaluation and investigation and management.   CLINICAL COURSE:  1. Ischemic colitis.  The patient has a known history of ischemic      colitis presenting with  intermittent hematochezia in the past.  It      is felt that this was once again, the culprit on this occasion.      She was commenced on combination of Ciprofloxacin and Flagyl, and      her NSAID's were held.  GI consultation was kindly provided by Dr.      Laural Benes who was very helpful in the management of this case.  The      patient's symptoms ameliorated with above mentioned management      measures including bowel rest and intravenous fluids.  By October 15, 2007 she no longer had any bowel symptoms.  Flagyl and      Ciprofloxacin were discontinued on October 16, 2007, without any      deleterious effects.   1. Gastric erosion.  The patient on October 15, 2007 complained of      chest pain which she states had commenced about two days prior.  In      addition she also complained of heartburn.  It was felt that she      did indeed have risk factors for coronary artery disease.  However,      a GI cause could not  be excluded.  Cardiology consultation was      called.  For further details see below.  Patient was commenced on      twice daily proton pump inhibitor.  EGD was done on October 17, 2007, and this showed an erosion at gastroesophageal junction;      otherwise, negative.  She has been recommended continued proton      pump inhibitor treatment.  NSAID's will be on hold, until reviewed      by primary M.D.   1. Atypical chest pain.  The patient as mentioned above, complained of      chest pain.  A 12-lead EKG was unremarkable for acute ischemic      changes.  Cardiac enzymes remained unelevated.  However, in view of      the fact that she was felt to have risk factors for coronary artery      disease, given her prior history of cerebrovascular disease, age      and hypertension, a cardiology consult was called, and was kindly      provided by Dr. Riley Kill.  She underwent 2D echocardiogram on      October 16, 2007 which showed LVEF of 50% as well as inferior      hypokinesis/akinesis.  Cardiologist has recommended an outpatient      stress Myoview which will be scheduled, for further risk      stratification.   1. Hypertension.  The patient does have a known history of      hypertension.  However, at the time of her initial presentation BP      was borderline at 103/54.  Lotensin was held temporarily, but we      were subsequently were able to reinstate this, without any      deleterious affects.  She remained normotensive throughout the      course of her hospitalization, thereafter.   1. Normocytic anemia.  The patient has a known history of normocytic      anemia.  Her hemoglobin remained stable between 10.3 and 7.8 during      the course of this hospitalization.   1. History of depression.  The patient's mood was stable throughout  the course of her hospitalization, on pre-admission antidepressant      medication.   1. History of COPD.  There were no problems referable to  this, during      course of patient's hospitalization.   DISPOSITION:  The patient was on October 17, 2007, considered  sufficiently clinically recovered and stable for discharge.  She has  therefore been discharged accordingly.   DIET:  Heart healthy.   ACTIVITY:  As tolerated.   FOLLOWUP INSTRUCTIONS:  The patient is recommended to follow up with her  primary M.D., Dr. Marisue Brooklyn, within one week. She is to call for an  appointment.  She is also to follow up with Dr. Carman Ching,  gastroenterologist, in one month.  In addition she is to follow up with  Dr. Riley Kill of [facility] for outpatient stress test, at a date  to be determined.   SPECIAL INSTRUCTIONS:  The patient has been recommended to discontinue  Aspirin and Mobic, until reevaluated by primary M.D. which hopefully  should be within one week.      Isidor Holts, M.D.  Electronically Signed     CO/MEDQ  D:  10/17/2007  T:  10/17/2007  Job:  [id number]   cc:   Lovenia Kim, D.O.  Llana Aliment Malon Kindle., M.D.  Arturo Morton. Riley Kill, MD, [facility]

## 2011-02-06 NOTE — Consult Note (Signed)
[name] [name], [name] [name] NO.:  [id number]   MEDICAL RECORD NO.:  [id number]          PATIENT TYPE:  INP   LOCATION:  5039                         FACILITY:  [facility]   PHYSICIAN:  Arturo Morton. Riley Kill, MD, FACCDATE OF BIRTH:  October 02, 1924   DATE OF CONSULTATION:  DATE OF DISCHARGE:                                 CONSULTATION   CHIEF COMPLAINT:  Chest pain.   HISTORY OF PRESENT ILLNESS:  [name] [name] is an 77 year old who presents  to the emergency department with a 24 hour history of bloody stools.  She thought she lost about 2 gallons of blood.  She also complains of  left lower quadrant abdominal pain.  She apparently has a history of  ischemic colitis in the past, and this was thought to be due to that.  The patient is on aspirin and Plavix which has subsequently been  stopped.  She has a prior history of DVT with subsequent filter  placement.  She was on warfarin prior to that time.  This was stopped  about 3 years ago.  In reviewing her history, she was scheduled to see  Dr. Eden Emms.  She has had an increasing history of some recurrent  discomfort for the past year.  She says it comes on at any time, but  seems to be even worse after eating even to the point where she has to  some extent at times stopped eating.  It lasts less than a minute.  It  is not associated with diaphoresis or significant shortness of breath,  but is rated at about 5/10.  She also describes some burning tenderness  in the mid epigastrium.  She has taken Pepto-Bismol for this in the  past.  She says that she does feel like burping and belching at times  and it does seem to help.   ALLERGIES:  INCLUDE LATEX AND PENICILLIN.   MEDICATIONS:  Prior to admission, she was on Darvocet 100 mg every 4  hours, benazepril 20 mg daily, aspirin 325 mg daily, amitriptyline  nightly, Mobic 75 mg b.i.d., hydroxy 25 every 6 hours, cyclobenzaprine  10 every 6 hours, temazepam 30 mg every 8 hours, Plavix 75  mg.   PAST MEDICAL HISTORY:  Remarkable for a DVT with a history of inferior  vena cava filter placement in March of 2007.  She has hypertension,  history of normocytic anemia, and COPD.  She also has had depression,  IBS, ischemic colitis, history of stroke, rhabdomyolysis with a fall.   PAST SURGICAL HISTORY:  Include cataracts, status post lumbar  laminectomy, bilateral hip replacement, bilateral knee replacement,  hysterectomy, cholecystectomy.   SOCIAL HISTORY:  The patient lives in [place].  She is widowed.  She has  3 children.  She is a retired Futures trader.  She does not smoke.   FAMILY HISTORY:  Mother died in 30s of old age, father in 87s of old  age.  She has a sibling who has colon cancer.   REVIEW OF SYSTEMS:  Remarkable for some sweats at night.  She has had  this intermittent chest pain that radiates to the epigastrium  and rarely  to the jaws.  She has had bright red blood per rectum.  She has a little  bit of cold intolerance.  An 8 point review of systems is otherwise  negative.   PHYSICAL EXAMINATION:  GENERAL APPEARANCE:  She is alert and oriented.  She is in no acute distress and appears somewhat elderly.  She answers  questions easily.  VITAL SIGNS:  Blood pressure is 120/65, respiratory rate 18, pulse 75,  temperature 97.7, 93% on room air.  NECK:  There are no obvious carotid bruits.  There is no obvious jugular  venous distention.  CARDIOVASCULAR:  The PMI is nondisplaced.  There is normal first and  second heart sound and no significant murmurs, rubs, or gallops.  ABDOMEN:  Soft, but there is tenderness in the epigastric area.  There  are no obvious masses noted.  LUNGS:  Fields are clear to auscultation and percussion.  SKIN:  There are multiple seborrheic keratoses.  NEUROLOGIC:  Nonfocal.   LABORATORIES:  The electrocardiogram demonstrates a normal sinus rhythm.  There is nonspecific ST abnormality, but otherwise unremarkable.   BUN is 16, creatinine  0.94.  Liver functions are normal.  Albumin is  normal.  Hemoglobin is depressed to 10.7 with a hematocrit of 31.5,  platelet count is 189,000.   IMPRESSION:  1. History of some recurrent chest pain, worse recently.  2. History of gastroesophageal symptoms with pain with burping,      belching, and also difficulty eating with some dysphagia.  3. Multiple cardiac risk factors.  4. Other problems as outlined above.   RECOMMENDATIONS:  We did an EKG today which was nonischemic.  I would  get a 2-D echocardiogram.  I would also consider doing an upper  endoscopy and some consideration might be given to discontinue her  nonsteroidal anti-inflammatory drugs.  Finally, it would not be  unreasonable to do an adenosine Cardiolite, but currently she is not an  interventional candidate with bleeding.  Therefore, would recommend  doing the upper endoscopy first prior to any further cardiac workup.  We  will follow the patient with you.      Arturo Morton. Riley Kill, MD, [facility]  Electronically Signed     TDS/MEDQ  D:  10/15/2007  T:  10/16/2007  Job:  [id number]

## 2011-02-06 NOTE — Assessment & Plan Note (Signed)
[facility]                            CARDIOLOGY OFFICE NOTE   NAME:[name] [name], [name] [name]                   MRN:          [id number]  DATE:11/12/2008                            DOB:          06/20/1925    [name] [name] is in for followup.  She is out of a nursing home and  amazingly, is driving herself.  She has not had a lot of chest pain.  She just has an occasional discomfort in the midchest.  The patient has  a history of DVT.  She has a filter in and is thought not to be a  Coumadin candidate.  She has been followed by Dr. Marisue Brooklyn.  She  was admitted to the hospital in October.  The patient also has a number  of things including the scoliosis and advanced multilevel degenerative  disk disease.  Of note, when she was ready for discharge from the  hospital, her potassium was down to 3.0.  We did not see her during this  hospitalization.  It is not clear to me that if she has had any followup  on this.  I had last seen her in consultation in February 2009, and she  had a nuclear study done in the office, which was read as a low stress  study.  There was some breast attenuation, but looking at the images,  there was pretty good and uniform uptake in all myocardial segments.  The patient has also had a 2-D echocardiogram demonstrating an ejection  fraction in the range of 50% with some hypokinesis with akinesis of the  inferoposterior wall, but there is not a definite finding of this on the  nuclear scan.   CURRENT MEDICATIONS:  1. Multivitamin daily.  2. Fish oil 1200 mg daily.  3. Gabapentin 300 mg b.i.d.  4. Amlodipine 5 mg daily.  5. Citalopram 20 mEq a day.  6. Hydrochlorothiazide 12.5 daily.   On physical, the blood pressure is 150/70, the pulse is 71.  The lung  fields are clear and the cardiac rhythm is regular.   We will go ahead based on the previous laboratory studies and get a  basic metabolic profile on her.  She does not think  that her symptoms  have advanced much, and based on this and her low-risk nuclear study, we  will not recommend any type of catheterization study at the present  time.  Her current EKG is completely within normal limits.  Should there  be a change in pattern, she knows to contact us.  I have encouraged her  to continue to follow up with Dr. Marisue Brooklyn and we will get a basic  profile today.     Arturo Morton. Riley Kill, MD, [facility]  Electronically Signed    TDS/MedQ  DD: 11/12/2008  DT: 11/13/2008  Job #: [id number]

## 2011-02-06 NOTE — Op Note (Signed)
NAMEELANIE, HAMMITT NO.:  [id number]   MEDICAL RECORD NO.:  [id number]          PATIENT TYPE:  INP   LOCATION:  5039                         FACILITY:  [facility]   PHYSICIAN:  Danise Edge, M.D.   DATE OF BIRTH:  December 08, 1924   DATE OF PROCEDURE:  10/17/2007  DATE OF DISCHARGE:                               OPERATIVE REPORT   PROCEDURE INDICATION:  Ms. Kanani Mowbray is an 77 year old female  born 1925/02/11.  Ms. Quilter has chronic heartburn associated  with a normal esophagogastroduodenoscopy performed in 1998.   She has undergone two colonoscopic exams which documented ischemic  colitis involving her left colon.  Her last colonoscopy was August 07, 2001 performed by Dr. Carman Ching.   Ms. Morissette was actually admitted to the hospital with hematochezia and  left lower quadrant abdominal pain suspicious for ischemic colitis.  She  responded to antibiotic therapy and her bleeding stopped.   During her hospitalization she was experiencing bouts of chest pain and  heartburn.  She has been evaluated by cardiology.  An  esophagogastroduodenoscopy is scheduled to rule out erosive esophagitis.   ENDOSCOPIST:  Danise Edge, M.D.   PREMEDICATION:  Fentanyl 50 mcg, Versed 6 mg.   PROCEDURE:  After obtaining informed consent Ms. Adkison was placed in  the left lateral decubitus position.  I administered intravenous  fentanyl and intravenous Versed to achieve conscious sedation for the  procedure.  The patient's blood pressure, oxygen saturation and cardiac  rhythm were monitored throughout the procedure and documented in the  medical record.   The Pentax gastroscope was passed through the posterior hypopharynx into  the proximal esophagus without difficulty.  The hypopharynx, larynx and  vocal cords appeared normal.   Esophagoscopy:  The proximal and mid segments of the esophagus appeared  normal.  The squamocolumnar junction is noted at  approximately 40 cm  from the incisor teeth.  There is an isolated superficial erosion at the  esophagogastric junction.  Otherwise the esophagus appears completely  normal.  There is no endoscopic evidence for the presence of esophageal  tumors, esophageal scarring or Barrett's esophagus.   Gastroscopy:  Retroflex view of the gastric cardia and fundus was  normal.  I do not detect any significant hiatal hernia.  The gastric  body, antrum and pylorus appear normal.   Duodenoscopy:  The duodenal bulb and descending duodenum appeared  normal.   ASSESSMENT:  Chronic heartburn and atypical chest pain associated with  an essentially normal esophagogastroduodenoscopy today except for a  small superficial isolated erosion at the esophagogastric junction.   RECOMMENDATIONS:  I would place Ms. Dern on proton pump inhibitor  therapy before breakfast and supper daily.  I will ask her to make a  follow-up appointment with Dr. Vilinda Boehringer in approximately 1 month to  assess her response to double dose proton pump inhibitor therapy.           ______________________________  Danise Edge, M.D.     MJ/MEDQ  D:  10/17/2007  T:  10/17/2007  Job:  [id number]   cc:   Fayrene Fearing L. Malon Kindle.,  M.D. 

## 2011-02-06 NOTE — Discharge Summary (Signed)
NAMEJESSAMY, TOROSYAN NO.:  [id number]   MEDICAL RECORD NO.:  [id number]          PATIENT TYPE:  OBV   LOCATION:  3701                         FACILITY:  [facility]   PHYSICIAN:  Lonia Blood, M.D.DATE OF BIRTH:  11-25-1924   DATE OF ADMISSION:  06/21/2008  DATE OF DISCHARGE:  06/25/2008                               DISCHARGE SUMMARY   PRIMARY CARE PHYSICIAN:  Lovenia Kim, D.O.   DISCHARGE DIAGNOSES:  1. Severe multijoint osteoarthritis/degenerative joint disease with      generalized weakness and deconditioning - with placement in skilled      nursing facility with rehab as goal.  2. Hypertension - controlled.  3. Urinary tract infection - empiric Cipro therapy.  4. History of deep venous thrombosis with known right lower extremity      deep venous thrombosis at present time.      a.     Status post IVC filter in the past.      b.     Poor candidate for anticoagulation secondary to multiple       falls.  5. Delirium.      a.     Felt to be secondary to Elavil.      b.     Elavil adjustment ongoing.      c.     Outpatient psych appointment suggested for re-evaluation.      d.     Declared competent to make decisions by psychiatry.  6. Major depression.  7. Chronic normocytic anemia.  8. Chronic obstructive pulmonary disease.  9. Gastroesophageal reflux disease.  10.Irritable bowel syndrome.  11.Ischemic colitis January 2009.  12.History of cerebrovascular accident.  13.Status post cataract surgery.  14.Status post lumbar laminectomy.  15.Status post bilateral hip replacements.  16.Status post bilateral knee replacements.  17.Status post cholecystectomy.  18.Status post hysterectomy.   DISCHARGE MEDICATIONS:  1. Enteric coated aspirin 325 mg p.o. daily.  2. Protonix 40 mg p.o. daily.  3. Senokot 1 tablet p.o. q.h.s.  4. Elavil 50 mg p.o. q.h.s.  5. Requip 1 mg p.o. daily.  6. Norvasc 5 mg p.o. daily.  7. Neurontin 300 mg p.o. b.i.d.  8.  Tylenol 650 mg p.o. t.i.d. on schedule.  9. Oxycodone 5 mg p.o. q.4 hours p.r.n. severe pain.  10.Flexeril 10 mg p.o. q.6 hours p.r.n. severe spasms.  11.Ambien 5 mg p.o. q.h.s. p.r.n. insomnia.   FOLLOWUP:  1. The patient should be scheduled for outpatient psychiatry      evaluation for reevaluation of delirium.  Consideration should be      given to further pharmacologic therapy if the patient's major      depressive disorder recurs significantly with marked decrease in      elevated dose.  2. Ongoing medical care will be provided by the attending of record or      the patient's nursing home of choice.  The patient should simply be      followed to assure that her hypertension remains well-controlled      and that she receives intensive physical and occupational therapy.   CONSULTATIONS:  None.  PROCEDURES:  1. Bilateral lower extremity ABIs - preliminary report reveals greater      than 1.0 bilaterally.  2. Bilateral lower extremity venous Dopplers - DVT in the right to      include right common femoral, femoral profunda and popliteal veins.      Evidence also to suggest superficial thrombus greater saphenous      vein bilaterally.  3. Lumbar spine film - confirmation of IVC filter with no evidence of      acute skeletal findings.   HOSPITAL COURSE:  Ms. Jissell Trafton is an 77 year old female with a  medical history as detailed below.  She presented to the hospital on  June 21, 2008, with complaints of severe pain in both hips, knees  and her right calf which were limiting her ability to walk.  The patient  lived independently prior to the hospital stay.  She was admitted to the  acute unit essentially for failure to thrive and for evaluation of her  inability to walk.  This appeared clinically to be a musculoskeletal  issue and not a neurologic issue.  Multiple x-rays were accomplished to  rule out acute fracture and these were in fact negative.  Given the  patient's  history of DVT Doppler evaluations were carried out of the  lower extremities.  A subacute/acute DVT was appreciated in the right  lower extremity.  It is appreciated further that the patient has an IVC  filter.  This is due to a prior history of DVT formation and the fact  that the patient falls and is felt to be a poor candidate for Coumadin  therapy.  It is our opinion that she continues to be an exceedingly poor  candidate for anticoagulation.  We have not initiated this long-term  during her hospital stay as a result.  The patient also experienced  significant confusion during her hospital stay.  It was noted that she  was on an exceedingly high dose of Elavil at home.  Psychiatry was  consulted to evaluate the patient for competency.  Recommendation was  made to markedly decrease the patient's Elavil to her current dose.  She  tolerated this well and her delirium improved.  Despite her delirium the  patient was deemed to be competent to make her own decisions by our  psychiatry evaluation.  Ultimately, when the patient's acute medical  issues had stabilized, discussion was carried out as to the appropriate  placement of the patient.  Medical team as well as therapist felt that  the most appropriate venue for discharge would be skilled nursing  facility for rehab.  The patient initially was reluctant but with  encouragement from family and the medical staff she has agreed to do so.  At the present time she is afebrile.  Vital signs are otherwise stable.  She is cleared for discharge to the rehab facility of her choice.  Followup issues are as discussed above.      Lonia Blood, M.D.  Electronically Signed    JTM/MEDQ  D:  06/25/2008  T:  06/25/2008  Job:  [id number]   cc:   Lovenia Kim, D.O.

## 2011-02-06 NOTE — Consult Note (Signed)
[name] [name], LABA NO.:  [id number]   MEDICAL RECORD NO.:  [id number]          PATIENT TYPE:  OBV   LOCATION:  3701                         FACILITY:  [facility]   PHYSICIAN:  Antonietta Breach, M.D.  DATE OF BIRTH:  04-16-1925   DATE OF CONSULTATION:  06/23/2008  DATE OF DISCHARGE:                                 CONSULTATION   REASON FOR CONSULTATION:  Mental status changes, hallucinations and  assess capacity.   REQUESTING PHYSICIAN:  [facility].   HISTORY OF THE PRESENT ILLNESS:  The patient is an 77 year old female  admitted to the [facility] on June 21, 2008 for weakness  and knee pain.  The patient developed confusion as well as  hallucinations, and she was brought to the emergency room by her family.  She had been on a elevated to 125 mg at bedtime; this has now been  decreased to Elavil 50 mg at bedtime.  Her mood is within normal limits  although her energy is decreased.  She does have intact interests and  intact hope.  She has no thoughts of harming herself or others.  She has  no delusions or hallucinations.  Her memory function has returned to  most of her baseline function.  She also has intact orientation.  She is  cooperative with bedside care.   PAST PSYCHIATRIC HISTORY:  The patient acknowledges a history of  depression.  She developed severe depression several decades ago and was  treated with Elavil; and that is the only antidepressant that she has  never been on.  She acknowledges that at that time she had a number of  weeks of depressed mood, low energy, difficulty concentrating and  anhedonia.  She was never taken off the Elavil.  On review of the past  medical record in March 2003 the patient was on Elavil a 75 mg at  bedtime along with Restoril 15 mg at bedtime.  In July 2003 depression  was listed in her medical record and she was on Elavil at 225 mg at  bedtime.   FAMILY PSYCHIATRIC HISTORY:  On the family  psychiatric history there is  none known.   SOCIAL HISTORY:  The patient is widowed and she has been living alone  [place].  She does have three children.  She is a retired Futures trader.  She does not use alcohol or illegal drugs.   PAST MEDICAL HISTORY:  1. Hypertension.  2. Anemia.  3. Degenerative joint disease.  4. Chronic obstructive pulmonary disease.  5. Gastroesophageal reflux disease.  6. Irritable bowel syndrome.  7. History of ischemic colitis.  8. History of cerebrovascular accident.  9. History of DVT with an IVC filter implanted.   MEDICATIONS:  The MAR is reviewed;  1. Elavil 50 mg at bedtime.  2. Requip 1 every day.  3. Ambien 5 mg at bedtime.   ALLERGIES:  PENICILLIN, CONTRAST MEDIA and LATEX.   LABORATORY DATA:  Sodium 135, BUN 13, creatinine 0.73 and glucose 116.  WBC 8.7, hemoglobin 10.8 and platelet count 182,000.  SGOT 27 and SGPT  18.  TSH unremarkable.  REVIEW OF SYSTEMS:  CONSTITUTIONAL, HEAD, EYES, EARS, NOSE AND THROAT,  MOUTH, NEUROLOGIC, PSYCHIATRIC CARDIOVASCULAR, RESPIRATORY,  GASTROINTESTINAL, GENITOURINARY, SKIN, MUSCULOSKELETAL,  HEMATOLOGIC/LYMPHATIC, and ENDOCRINE/METABOLIC are all unremarkable.   PHYSICAL EXAMINATION:  VITAL SIGNS:  Temperature 98.9, pulse 69,  respiratory rate 18, blood pressure 133/63, and O2 saturation on room  air 95%.  GENERAL APPEARANCE:  The patient is an elderly female lying in a supine  position in her hospital bed with no abnormal involuntary movements.   MENTAL STATUS EXAMINATION:  The patient's eye contact is good.  Her  attention span is slightly decreased.  Concentration mildly decreased.  Affect is slightly anxious.  Mood is within normal limits.  She is  oriented to all spheres.  Memory is 3/3 and immediate 2/3 on recall.  Her fund of knowledge and intelligence are within normal limits.  Speech  involves normal rate and prosody without dysarthria.  Thought process is  logical, coherent and goal-directed.   No looseness of associations.  Thought content; no thoughts of harming herself, no thoughts of harming  others, no delusions, and no hallucinations.  Insight is intact.  Judgment is intact.   ASSESSMENT:  AXIS I  1. 293.00 Delirium, not otherwise specified, now resolved with the      reduction of the Elavil.  With the patient's age her brain is      naturally going to be relatively deficient in cholinergic tone.  A      medication such as Elavil with its strong anticholinergic      properties will lower her delirium threshold significantly.  2. 296.25 History of major depressive disorder, single episode, in      remission, except for decreased energy.  AXIS II  None.  AXIS III  See the past medical history.  AXIS IV  Primary support group; general medical.  AXIS V  Fifty-five.   DISCUSSION:  The patient is not at risk to harm herself or others.  She  agrees to call emergency services immediately for any thoughts of  harming herself, thoughts of harming others or return of depression.  The undersigned provided ego-supportive psychotherapy and education.  The patient agrees with the reduction of Elavil.  She also agrees with  psychiatric follow up for monitoring her mood and providing alternative  psychotropic medication if needed.   The patient does have the ability to make a consistent choice.  She can  differentiate between her options and their associated risks versus  benefits.  She can appreciate her risks of morbidity and mortality as  they directly apply to her.  She also can reason well.  The patient  explains, that although she would be at increased risk for morbidity and  mortality at home such as falling and potentially causing lethal damage,  she would rather be at home, than in a nursing home for the mental  benefit.   RECOMMENDATIONS:  1. Concur with the reduction of Elavil.  2. I would have the patient follow up with one of the outpatient      psychiatric clinics  at [facility]      or [facility] for monitoring her mood and psychotropic      medication management, including alternatives to Elavil if they      become necessary.   As discussed above the patient does have the capacity to choose her  environment after discharge.      Antonietta Breach, M.D.  Electronically Signed     JW/MEDQ  D:  06/24/2008  T:  06/25/2008  Job:  [id number]

## 2011-02-09 NOTE — Discharge Summary (Signed)
[place]  Patient:    [name] Walker, [name] Walker Visit Number: [id number] MRN: [id number]          Service Type: MED Location: [phone] Attending Physician:  Orland Mustard Dictated by:   Llana Aliment. Randa Evens, M.D. Admit Date:  03/25/2002 Discharge Date: 03/28/2002   CC:         Lovenia Kim, D.O.   Discharge Summary  REASON FOR ADMISSION:  Abdominal pain and rectal bleeding.  FINAL DIAGNOSES: 1. Presumed ischemic colitis. 2. History of previous admissions for ischemic colitis. 3. Status post knee replacement. 4. Chronic obstructive pulmonary disease. 5. Irritable bowel. 6. History of deep venous thrombosis, on chronic anticoagulation. 7. History of depression. 8. Status post appendectomy, hysterectomy, knee replacement, cholecystectomy,    lumbar laminectomy.  HISTORY OF PRESENT ILLNESS:  The patient is a 77 year old whom has had several previous episodes of ischemic colitis, some documented by colonoscopy with biopsy.  She is on chronic Coumadin due to a previous history of deep venous thrombosis.  She came into the hospital with abdominal pain and loose bloody stool.  ADMISSION MEDICATIONS: 1. Restoril. 2. Lotensin. 3. Doxazosin. 4. Coumadin. 5. Amitriptyline.  PHYSICAL EXAMINATION:  VITAL SIGNS:  She was afebrile, vital signs were normal.  HEART:  Normal.  LUNGS:  Normal.  ABDOMEN:  Soft with normal bowel sounds, tenderness across the whole lower abdomen with no rebound.  Bright red blood on examining finger.  For more details, please see dictated admission history and physical.  HOSPITAL COURSE:  The patient was admitted to a medical floor, placed on IV fluids, and given liquids.  Her initial labs were remarkable for a hemoglobin of 10.0, and actually had gone up some to 12, then subsequently waxed and waned down in the 10 to 11 range.  White blood cell count was normal. Prothrombin time was elevated at 26.  The  patients Coumadin was on hold, and her bleeding slowed down tremendously.  She was feeling much better by the following day.  She was started back on clear liquids and supportive therapy, pain improved.  She subsequently had liquid greenish-brown stool with no blood.  She was tolerating a diet.  It was felt that she had another mild episode of ischemic colitis.  DISPOSITION:  The patient is discharged home on her same medications, specifically Restoril, Lotensin, Doxazosin, Coumadin, and amitriptyline.  FOLLOWUP: 1. She is due to have her prothrombin time drawn in Dr. Rock Nephew office    next week, which she will go ahead and keep. 2. She will see Dr. Randa Evens back in the office in two to three weeks. Dictated by:   Llana Aliment. Randa Evens, M.D. Attending Physician:  Orland Mustard DD:  04/06/02 TD:  04/09/02 Job: [id number]

## 2011-02-09 NOTE — H&P (Signed)
[place]  Patient:    [name] Walker, [name] Walker Visit Number: [id number] MRN: [id number]          Service Type: MED Location: [phone] Attending Physician:  Orland Mustard Dictated by:   Roosvelt Harps, M.D. Admit Date:  12/12/2001   CC:         [name] Walker, D.O.  Llana Aliment Randa Evens, M.D.   History and Physical  HISTORY OF PRESENT ILLNESS:  [name] Walker is a 77 year old female who has been followed in our practice for recurrent episodes of ischemic colitis.  Today she began having diarrhea which was frankly bloody, associated with low abdominal pain.  She has had multiple episodes today and this was documented in the ER.  In addition, rectal exam was grossly bloody.  Her last episode of ischemic colitis was in November of 2002 and colonoscopy with biopsies were compatible with this.  She has been on Coumadin originally for a DVT and apparently continued because of her recurrent episode of ischemic colitis. She also has a prior history of reflux, which is not apparently bothering her much at present.  She had a negative endoscopy in 1998.  She denies dysphagia, epigastric pain, or weight loss.  PAST MEDICAL HISTORY:  Pertinent for COPD, hypertension, reflux, irritable bowel syndrome, osteoarthritis, anxiety, and depression.  CURRENT MEDICATIONS: 1. Restoril 15 mg q.h.s. 2. Lotensin 40 mg q.d. 3. Doxazosin 2 mg q.d. 4. Coumadin 5 mg q.d. 5. Premarin. 6. Amitriptyline 75 mg q.h.s.  ALLERGIES:  PENICILLIN.  PAST SURGICAL HISTORY: 1. Hysterectomy. 2. Appendectomy. 3. Right knee replacement. 4. Lumbar laminectomy. 5. Skin cancer removal. 6. Cholecystectomy. 7. Benign breast lesion removal.  FAMILY HISTORY:  Negative for ulcers, gallstones, inflammatory bowel disease, or colorectal neoplasia.  SOCIAL HISTORY:  Lives alone, but has family nearby.  A nonsmoker and nondrinker at present.  REVIEW OF SYSTEMS:  General:  No  weight loss or night sweats.  Endocrine:  No history of diabetes or thyroid problems.  Skin:  No rash or pruritus.  Eyes: No icterus or change in vision.  ENT:  No aphthous ulcers or chronic sore throat.  Respiratory:  Mild exertional dyspnea.  Cardiac:  No chest pain, palpitations, or history of valvular heart disease.  GI:  As above.  GU:  No dysuria or hematuria.  PHYSICAL EXAMINATION:  She is a well-developed, well-nourished, adult female in no acute distress.  VITAL SIGNS:  Afebrile with normal vital signs.  SKIN:  Normal.  HEENT:  Eyes anicteric.  Oropharynx unremarkable.  CHEST:  Distant, but clear breath sounds.  HEART:  Regular rate and rhythm.  ABDOMEN:  Obese and soft with positive bowel sounds.  There is diffuse guarding and lower abdominal tenderness.  There is no rebound.  RECTAL:  Bright red blood.  EXTREMITIES:  Trace edema.  LABORATORY DATA:  Laboratory tests are pending as is abdominal x-ray.  IMPRESSION:  A 77 year old female with multiple prior episodes of ischemic colitis, currently having low abdominal pain and rectal bleeding.  It is my feeling that she should be admitted for observation to make certain this does not progress into a surgical abdomen or that she does not have significant blood loss.  Coumadin must be held.  Serial hemoglobins will be followed. Please see the orders. Dictated by:   Roosvelt Harps, M.D. Attending Physician:  Orland Mustard DD:  12/12/01 TD:  12/13/01 Job: [id number]

## 2011-02-09 NOTE — H&P (Signed)
[place]. [facility]  Patient:    [name] Walker, [name] Walker Visit Number: [id number] MRN: [id number]          Service Type: MED Location: [phone] Attending Physician:  Orland Mustard Dictated by:   Roosvelt Harps, M.D. Admit Date:  03/25/2002 Discharge Date: 03/28/2002   CC:         Lovenia Kim, D.O.  Llana Aliment Randa Evens, M.D.   History and Physical  HISTORY OF PRESENT ILLNESS: [name] Walker is a 77 year old female who has been recurrently seen in our practice and admitted for episodes of probable ischemic colitis.  Her last episode and admission was in March 2003.  Today she began having low abdominal pain following eating grits for breakfast. This was followed by multiple episodes of grossly bloody stool, which were documented in the emergency room.  However, her hemoglobin is 12.  She says she felt light headed but she is not orthostatic at this time.  Her last colonoscopy was in November 2002 when she had a similar episode.  Biopsies confirmed ischemia.  She has been on Coumadin for a DVT, and apparently this was continued because of her current ischemic colitis.  Currently her prothrombin time is 26.4 with an INR of 3.  She has a history of reflux but she is not currently taking any medicines for this, and she had a negative upper endoscopy in 1998.  She does not have any weight loss or upper abdominal pain.  PAST MEDICAL HISTORY:  1. COPD.  2. Status post knee replacement.  3. Reflux.  4. Depression.  5. DVT.  6. Irritable bowel.  7. Hypertension.  8. Osteoarthritis.  CURRENT MEDICATIONS:  1. Restoril 30 mg q.h.s.  2. Lotensin 40 mg q.d.  3. Doxazosin 2 mg q.d.  4. Coumadin 5 mg q.d.  5. Amitriptyline 225 mg q.h.s.  ALLERGIES: PENICILLIN.  PAST SURGICAL HISTORY:  1. Hysterectomy.  2. Appendectomy.  3. Right knee replacement.  4. Lumbar laminectomy.  5. Skin cancer removal.  6. Cholecystectomy..  7. Benign breast  lesion.  FAMILY HISTORY: Negative for ulcers, gallstones, inflammatory bowel disease, or colorectal neoplasia.  SOCIAL HISTORY: Lives alone but has nearby family who help.  Currently nonsmoker, nondrinker.  REVIEW OF SYSTEMS: GENERAL: No weight loss or night sweats.  ENDOCRINE: No history of diabetes or thyroid problems.  SKIN: No rash or pruritus.  EYES: No icterus or change in vision.  ENT: No aphthous ulcers or chronic sore throat. RESPIRATORY: Positive for exertional dyspnea.  CARDIAC: No recent chest pain, palpitations, or history of valvular heart disease.  GI: As above.  GU: No dysuria or hematuria.  PHYSICAL EXAMINATION:  GENERAL: She is a well-developed, well-nourished, obese female in no acute distress.  VITAL SIGNS: Afebrile.  Blood pressure 159/65, pulse 82 and regular.  SKIN: Normal.  HEENT: Eyes anicteric.  Oropharynx unremarkable.  CHEST: Distant clear breath sounds.  CARDIAC: Heart sounds regular rate and rhythm, also distant.  ABDOMEN: Obese and soft, with normoactive bowel sounds.  There is tenderness across the entire lower abdomen but the upper abdomen is normal.  There is no rebound.  RECTAL: Examination per ER physician reveals bright red blood.  EXTREMITIES: Trace edema.  LABORATORY DATA: Hemoglobin 12.  Venous pH 7.4.  WBC 9.4 with a normal Differential.  Prothrombin time 26.4, INR 3.  IMPRESSION: [age]-year-old female with lower gastrointestinal bleeding, which is likely ischemic.  She is not worrisomely overanticoagulated; however, any bleeding lesion could be aggravated by  her Coumadin.  She currently appears very comfortable but does require admission for observation and serial hemoglobins.  I do not believe colonoscopy is necessarily indicated if her symptoms rapidly resolve.  Similarly, I do not believe intravenous antibiotics need to be started just yet since she has weathered so many of these episodes well in the past.  PLAN:   1. She will be hydrated.  2. Serial hemoglobins and vital signs will be checked.  3. Abdominal x-ray series is ordered.  4. Consideration will be given to ordering a CT scan of her abdomen     in the morning.  5. Please see orders. Dictated by:   Roosvelt Harps, M.D. Attending Physician:  Orland Mustard DD:  03/25/02 TD:  03/29/02 Job: [id number]

## 2011-02-09 NOTE — Procedures (Signed)
[place]. [facility]  Patient:    [name] Walker, [name] Walker Visit Number: [id number] MRN: [id number]          Service Type: END Location: ENDO Attending Physician:  Orland Mustard Dictated by:   Llana Aliment. Randa Evens, M.D. Proc. Date: 08/07/01 Admit Date:  08/07/2001   CC:         Marinus Maw, M.D.   Procedure Report  PROCEDURE:  Colonoscopy and biopsy.  MEDICATIONS:  Fentanyl 100 mcg, Versed 2 mg IV.  SCOPE:  Olympus pediatric video colonoscope.  INDICATION:  Abdominal pain and rectal bleeding in a woman who has had two previous bouts of ischemic colitis.  DESCRIPTION OF PROCEDURE:  The procedure had been explained to the patient and consent obtained.  With the patient in the left lateral decubitus position, the Olympus video colonoscope was inserted and advanced under direct visualization.  The colon had been prepped and was clean.  We were able to advance to the cecum.  The ileocecal valve and appendiceal orifice were seen. Scope withdrawn.  Cecum, ascending colon, hepatic flexure, transverse colon, splenic flexure, descending, and sigmoid colon seen well upon removal.  From 50 cm to 30 cm, the area was edematous and inflamed and friable but was not ulcerated.  This was documented photographically, and several biopsies were taken.  The mucosa endoscopically was normal throughout the remainder of the colon as in the rectum.  The scope was withdrawn.  The patient tolerated the procedure well.  ASSESSMENT:  Segmental colitis, probably ischemia.  PLAN:  Will have her remain on clear liquids with slow advancement of the diet.  Will check pathology.  Will restart Coumadin November 18 and will see back in the office on November 29 at 10:45. Dictated by:   Llana Aliment. Randa Evens, M.D. Attending Physician:  Orland Mustard DD:  08/07/01 TD:  08/07/01 Job: [id number]

## 2011-02-09 NOTE — Consult Note (Signed)
NAMEBANEEN, WIESELER NO.:  [id number]   MEDICAL RECORD NO.:  [id number]          PATIENT TYPE:  INP   LOCATION:  0374                         FACILITY:  [facility]   PHYSICIAN:  [name], M.D.   DATE OF BIRTH:  Jan 29, 1925   DATE OF CONSULTATION:  12/03/2004  DATE OF DISCHARGE:                                   CONSULTATION   December 03, 2004   REASON FOR CONSULTATION:  This patient is a 77 year old female with a  history of ischemic colitis.  We were asked to see the patient in regards to  rectal bleeding. The patient states that for years she has been having  passage of bright red blood per rectum on an intermittent basis.  She states  that she is passing this blood again. She states that she had some last week  and again some was noted today by the nurse. She is also having a little bit  of left lower quadrant abdominal discomfort. Her hemoglobin and hematocrit  are 9.9 and 28.8 respectively. These values have remained relatively stable  over the last couple of days. Her last colonoscopy that I can find on E  chart was done by Dr. Randa Evens in November of 2002. This showed a segmental  ischemic colitis between 30 and 50 cm.  She was again seen in the hospital  in 2003 by Dr. Luther Parody at which time she had some further gastrointestinal  bleeding.   PAST MEDICAL HISTORY:  1.  Allergies:  Penicillin.  2.  Medical problems:  3.  Hypertension.  4.  DVT right leg.  5.  Stroke.  6.  Ischemic colitis.  7.  COPD.  8.  GERD.  9.  Depression.  10. Irritable bowel syndrome.   PAST SURGICAL HISTORY:  Hysterectomy, knee surgery, hip replacement, back,  laminectomy.   MEDICATIONS:  Darvocet, Vicodin, doxazosin, cyclobenzaprine, Lotensin,  Requip, Coumadin, amitriptyline, Dilaudid, Restoril.   SOCIAL HISTORY:  Does not smoke or drink alcohol.   REVIEW OF SYMPTOMS:  No complaints of chest pain, shortness of breath, cough  or sputum production.   PHYSICAL  EXAMINATION:  VITAL SIGNS:  Stable. She does not appear in acute  distress. She is nonicteric.  HEART:  Regular rhythm, no murmurs are heard.  LUNGS:  Clear.  ABDOMEN:  Bowel sounds are normal. There is some mild tenderness in the left  lower quadrant but no rebound, guarding or hepatosplenomegaly.   IMPRESSION:  A 77 year old female with a history of chronic intermittent  rectal bleeding and history of ischemic colitis. She does not appear to be  having excessive bleeding at this time as her hemoglobin and hematocrit have  been relatively stable over the last few days. I would recommend  conservative treatment at this time with following of her H&H's.  I suspect  that the bleeding again is secondary to ischemic colitis. She is on Coumadin  which could aggravate bleeding; however, she is not bleeding bad. I would  follow her clinically. At this point, I am not certain that further  intervention with colonoscopy for diagnostic purposes would be warranted but  this can be reassessed on a day to day basis.      SFG/MEDQ  D:  12/03/2004  T:  12/04/2004  Job:  [id number]   cc:   Theone Stanley, MD   Llana Aliment Malon Kindle., M.D.  [address]  [place]  [place] [address]  Fax: [phone]   Lovenia Kim, D.O.  [address]  [place]  [place] [address]  Fax: [phone]

## 2011-02-09 NOTE — H&P (Signed)
NAME:  [name] Walker, [name] Walker            ACCOUNT NO.:  [id number]   MEDICAL RECORD NO.:  [id number]          PATIENT TYPE:  INP   LOCATION:  0374                         FACILITY:  [facility]   PHYSICIAN:  Melissa L. Ladona Ridgel, MD  DATE OF BIRTH:  1924/10/30   DATE OF ADMISSION:  12/01/2004  DATE OF DISCHARGE:                                HISTORY & PHYSICAL   Full H&P is on the chart.   CHIEF COMPLAINT:  Falling and back pain with bright red blood per rectum x1  day.   PRIMARY CARE PHYSICIAN:  Lovenia Kim, D.O.   HISTORY OF PRESENT ILLNESS:  Patient is a 77 year old white female who  states, I fell.  Patient states that two days prior to this admission, she  fell and could not get up.  She called her children, who brought her to the  emergency room for evaluation.  She was seen, and a full series of x-rays  were completed.  There were no obvious fractures or injuries.  She was  therefore sent home.  Patient states that she has been falling chronically  off and on over the year.  She states that sometimes she just loses her  balance.  On the day of this admission, the patient states, I'm sore all  over.  She states, I have chronic pain in my back and my legs, but it is  worse since falling two days ago.  She says that she has never had pain  like this, it is generally a 10/10.  The patient also relates passing bright  red blood per rectum x1 day, a number of episodes.  She states that in the  past, she has had trouble with a stroke to her bowels, and each time, she  said, bright red blood per rectum.  Patient also states that she has had  some diarrhea today.  She denies nausea or vomiting.  She states that she  has had no weight loss or weight gain recently.  She has had a poor appetite  for about a week but denied any symptoms of chest pain, shortness of breath,  or dysuria.   REVIEW OF SYSTEMS:  She states that she has had no headache, changes in  vision, or dizziness.  She knew she  was going to fall when she turned the  other night.  She did not lose consciousness, according to her.  All other  review of systems appear negative, including fever.  No chills, nausea or  vomiting.   PAST MEDICAL HISTORY:  Hypertension.  She has a history of blood clots,  particularly in the right leg.  She has had a stroke in the past.  She has  had locked bowels.  She has had COPD, GERD, depression, irritable bowel  syndrome, for which she is followed by Dr. Randa Evens.  She also has  osteoarthritis.   PAST SURGICAL HISTORY:  She has had right leg surgery.  She has had a  laminectomy in the past.  She has had two hip replacements, two knees  replaced, a hysterectomy, and a skin cancer removed.   ALLERGIES:  PENICILLIN.   MEDICATIONS:  Darvocet-N 100 and Vicodin, both of which have been stopped.  Doxazosin 4 mg q.h.s.  Cyclobenzaprine 10 mg q.h.s.  Lotensin 40 mg q.d.  Requip 1 mg 1/2 tab q.h.s. for the first three days and then has been  chronically on one tablet a day.  She takes Coumadin 5 mg q.h.s.  Amitriptyline is also used, but the dose is unknown.  She has been taking  Dilaudid 2 mg 2 tablets q.4h. and Restoril 30 mg q.h.s.; however, it appears  that she has two prescriptions for Restoril, one is 15 mg and one is a 30,  and it sounds as if she may be taking both.   SOCIAL HISTORY:  She lives alone.  She has Life Alert.  She does not smoke.  She does not drink.   FAMILY HISTORY:  Reviewed and is noncontributory.   PHYSICAL EXAMINATION:  VITAL SIGNS:  Temperature 98.5, blood pressure  105/55, pulse 58, respirations 20, saturations 98%.  GENERAL:  She is awake, alert and oriented x 3.  Well-developed, well-  nourished obese white female in moderate distress secondary to pain when she  moves.  HEENT:  Normocephalic and atraumatic.  Pupils are equal, round and reactive  to light.  Extraocular muscles appear intact.  She did have cataract  surgeries in both eyes.  Mucous  membranes are dry.  NECK:  Supple.  There is no JVD.  No carotid bruits.  LUNGS:  Clear to auscultation.  There are no rales, rhonchi or wheezes.  CARDIOVASCULAR:  Regular rate and rhythm.  Positive S1 and S2.  No S3 or S4.  No murmurs, rubs or gallops.  ABDOMEN:  Soft.  Obese.  Minimally tender in the left lower quadrant but no  guarding or rebound is present.  RECTAL:  No stool in the vault.  She has good tone.  EXTREMITIES:  Her left knee is unable to be bent secondary to pain.  There  is no swelling or erythema.  She does have a well-healed scar on both knees.  Deep tendon reflexes are not elicited in the knees, and the plantar reflexes  are unequivocal.  NEUROLOGIC:  She is awake, alert and oriented x3.  Cranial nerves II-XII are  intact.  Power is 4/5 secondary to pain.   LABS:  White count 10, hemoglobin 10.9, hematocrit 31.1, platelets 331.  Sodium 137, potassium 4.2, chloride 103, CO2 27, BUN 17, creatinine 1.1,  glucose 119.  AST is slightly elevated at 140.  ALT at 33.  Alk phos is 125.   Her EKG is pending.  Her PT/INR is pending.  We have also requested CK, MB,  and troponin, which are pending.   X-rays were reviewed from the visit from November 30, 2004.  Lumbar spine shows  multilevel degeneration with scoliosis.  Hips show bilateral hip replacement  with no fractures.   CT head shows no skull fracture or abnormalities.   ASSESSMENT/PLAN:  This is a 77 year old white female status post fall on  November 29, 2004, who was seen in the emergency room and found to have no acute  fractures.  She was released to home.  Patient returned this evening with  the inability to walk secondary to pain and one day of bright red blood per  rectum.   1.  Gastrointestinal:  She has no evidence of bright red blood per rectum on      her rectal exam; therefore, we will have to heme-check all of  her     stools.  We may want to consider a CT of the abdomen the morning if her      symptoms  recur.  The patient does have a history of an ischemic bowel,      which may be a recurring issue.  Her exam is not consistent at this time      with ischemia.  She has an increased AST and ALT.  I recommend checking      a left upper quadrant ultrasound when able to.  She will have heme      checks on all of her stools and will consider a GI consult if      appropriate.  Will check her H&H's q.6h. if she develops bright red      blood per rectum or we need to hydrate her.   1.  Genitourinary:  Will check a urinalysis and treat if she has an UTI that      is evident.   1.  Pulmonary:  This appears stable.  She has no complaints.   1.  Cardiovascular:  She has hypertension.  We will continue her Cardura,      Benazepril, and hold for decreased blood pressure.  I would like to      check orthostatics to see if this is contributing to some of her      falling.  Also check a CK, troponin, and MB to see if there is a      cardiovascular reason for her falling and also to follow up on muscle      pain, in terms of rhabdomyolysis.   1.  Deep venous thrombosis prophylaxis:  We will check a PT/INR and if      subtherapeutic, will cover her with Lovenox and continue her Coumadin.   1.  Orthopedics:  I would like to have a left knee x-ray because of her      inability to bend, even with passive assistance on the left knee.  Need      to consider an MRI of her hip and/or knee if she continues to have      discomfort.  I will discuss with radiology what the next appropriate      studies would be of her spine, in view of the fact there does not appear      to be any obvious fractures on her films.  We may wish to rule out      occult fracture with MRI; however, she has multiple levels of      degenerative disease, which makes it difficult to decide what level to      do her exam with MRI.      MLT/MEDQ  D:  12/04/2004  T:  12/04/2004  Job:  [id number]   cc:   Lovenia Kim, D.O.  [address]  [place]  [place] [address]  Fax: [phone]

## 2011-02-09 NOTE — Discharge Summary (Signed)
NAME:  [name] Walker, [name] Walker            ACCOUNT NO.:  [id number]   MEDICAL RECORD NO.:  [id number]          PATIENT TYPE:  INP   LOCATION:  0374                         FACILITY:  [facility]   PHYSICIAN:  Kela Millin, M.D.DATE OF BIRTH:  1924/11/02   DATE OF ADMISSION:  12/01/2004  DATE OF DISCHARGE:                                 DISCHARGE SUMMARY   DISCHARGE DIAGNOSES:  1.  Rhabdomyolysis, status post fall.  2.  Ischemic colitis.  3.  Gastroesophageal reflux disease.  4.  Hypertension.  5.  History of right lower extremity deep venous thrombosis - on chronic      Coumadin.  6.  History of cerebrovascular accident.  7.  History of chronic obstructive pulmonary disease.  8.  History of irritable bowel syndrome.  9.  History of depression.   CONSULTATIONS:  Gastroenterology - Dr. Evette Cristal.   STUDIES:  Abdominal ultrasound:  Status post cholecystectomy; spleen,  kidneys, and liver within normal limits. Vena cava and portions of  pancreatic tail might overlie.   HISTORY:  The patient is a 76 year old white female who presented to the ER  status post a fall with complaints of feeling sore all over. She called her  children who brought her to the ER for evaluation. She had been to the ER  previously when this happened initially and a full series of x-rays were  done at that time and these were negative and so the patient was sent home.  She came back at this time stating that she had been falling chronically off  and on over the year. She stated that sometimes she would just lose her  balance and on the day of admission she stated that she had chronic pain in  her back and legs but that this had been worse since she fell 2 days ago  when she first presented to the ER. She stated that she had never had a pain  like that and rated it at a 10/10. The patient also reported that she had  had bright red blood in her rectum x1 day - a number of episodes, and  admitted that she had had previous  problems with her bowels. The patient  also reported that she had some diarrhea on the day of admission. She denied  nausea or vomiting. She also denied weight loss or weight gain recently. The  patient reported that she had had a poor appetite for about a week but  denied any symptoms of chest pain, shortness of breath, or dysuria.   Her physical exam upon admission as per Dr. Derenda Mis revealed a  temperature of 98.5 with a blood pressure of 105/55, pulse of 58,  respiratory rate of 20, O2 saturation of 98%. Pertinent findings on exam  were on HEENT she did have cataract surgery in both eyes and mucous  membranes were dry. Her lungs were clear to auscultation with no crackles or  wheezes and on her abdominal exam it was soft, obese, minimally tender in  the left lower quadrant, but no guarding or rebound present. On her rectal  exam, there was no stool  in the rectal vault. She had good sphincter tone.  On her extremities, it was noted that the range was decreased in her left  knee secondary to pain. DTRs were not elicited in the knee. The rest of her  physical exam was within normal limits.   LABORATORY DATA:  White cell count 20, hemoglobin 10.9, hematocrit 31.1,  platelet count 331. Sodium 137, potassium 4.2, chloride 103, CO2 27, BUN of  17, creatinine 1.1, glucose of 119, and AST slightly elevated at 140, ALT  313, alkaline phosphatase 125.  X-rays were reviewed from the November 30, 2004  presentation and the lumbar spine films showed multilevel degeneration with  scoliosis, hips showed bilateral hip replacements with no fractures, and CT  scan of the head showed no skull fracture or abnormalities. Her total CPK  was 4854 and her CK-MB 27.6, with a troponin of 0.02.   #1 - RHABDOMYOLYSIS. The patient was started on IV fluids for hydration and  her renal function was monitored as well as her CPKs followed. The patient  was also started on analgesics for pain management. The  patient's CK  gradually decreased. Her last CK on March 13 was 958 and her myalgias/pain  resolved. The patient's renal function remained within normal limits  throughout her hospital stay. PT was consulted and evaluated the patient  while she was in the hospital. She was able to ambulate and she will be  discharged home with home health OT and an aide as recommended.   #2 - ABNORMAL LIVER FUNCTION TESTS. It was noted that the patient's AST and  alkaline phosphatase were elevated upon admission. The patient had an  abdominal ultrasound done and the results are as stated above - liver within  normal limits. The patient had subsequent LFTs on follow-up and her last AST  was 61 with an ALT of 30, alkaline phosphatase of 119, and a total bili of  0.6 - improved from admission. The impression was that the patient's LFT  elevation was likely secondary to increased Tylenol she was getting in the  pain medications she was receiving prior to admission.   #3 - ISCHEMIC COLITIS. As already discussed, the patient was complaining of  bright red blood per rectum upon admission and gastroenterology was  consulted. Dr. Evette Cristal saw the patient and his impression was that the patient  did not appear to have excessive bleeding as her H&H had remained relatively  stable over the few days in the hospital. He recommended conservative  treatment with monitoring her H&H and his impression was that the bleeding  was secondary to ischemic colitis. He noted that the patient being on  Coumadin could aggravate the bleeding but that since her H&H had remained  stable that the bleeding was not bad and that the anticoagulation could be  continued and the patient followed clinically. Dr. Evette Cristal stated that he was  not certain that further intervention with colonoscopy for diagnostic  purposes would be warranted but that this could be reassessed on a day-to-  day basis. GI followed the patient while she was in the hospital  and the patient did not have any further episodes of bright red blood per rectum.  The patient had her H&H monitored throughout her hospital stay. Her last  hemoglobin was 10.1 with a hematocrit of 28.7. The patient is to follow up  with her gastroenterologist as scheduled on December 21, 2004.   #4 - HISTORY OF DEEP VENOUS THROMBOSIS. The patient's INR was subtherapeutic  and so following GI evaluation she was restarted on Lovenox as well as  Coumadin and her PT/INR was monitored until the INR became therapeutic -  2.0, and her Lovenox was discontinued. The patient will be discharged on  Coumadin 5 mg p.o. daily and is to follow up with her primary care  physician, Dr. Marisue Brooklyn, for monitoring of her PT/INR and the Coumadin  dose adjusted as appropriate.   #5 - HYPERTENSION. The patient's blood pressure was controlled on her  outpatient medications during her hospital stay.   DISCHARGE MEDICATIONS:  1.  Coumadin 5 mg p.o. q.p.m.  2.  Iron sulfate 325 mg p.o. b.i.d.  3.  Senokot two p.o. q.h.s., hold for diarrhea.  4.  The patient to continue other preadmission medications except Vicodin,      Darvocet, or Tylenol products.   FOLLOW-UP CARE:  1.  Dr. Marisue Brooklyn in 3 days for PT/INR recheck and a CBC.  2.  Dr. Randa Evens on December 21, 2004 as scheduled.  3.  Home health team - R.N. and an aide.   DISCHARGE CONDITION:  Improved/stable.      ACV/MEDQ  D:  12/06/2004  T:  12/06/2004  Job:  [id number]   cc:   Lovenia Kim, D.O.  [address]  [place]  [place] [address]  Fax: [phone]   Llana Aliment. Malon Kindle., M.D.  [address]  [place]  [place] [address]  Fax: [phone]

## 2011-02-09 NOTE — H&P (Signed)
NAMERIONNA, FELTES NO.:  [id number]   MEDICAL RECORD NO.:  [id number]          PATIENT TYPE:  INP   LOCATION:  5735                         FACILITY:  [facility]   PHYSICIAN:  Lonia Blood, M.D.DATE OF BIRTH:  [birth year]   DATE OF ADMISSION:  11/28/2005  DATE OF DISCHARGE:                                HISTORY & PHYSICAL   PRIMARY CARE PHYSICIAN:  Lovenia Kim, D.O.   PHYSICIAN REQUESTING CONSULT:  Trauma Service M.D.   REASON FOR CONSULTATION:  Syncope.   HISTORY OF PRESENT ILLNESS:  Ms. [name] Walker is a very pleasant 77-year-  old female who lives alone.  She was admitted to the trauma service for 24-  hour observation after suffering a fall at home in the early morning hours  of November 27, 2005.  She has been observed overnight and cleared for discharge  from the trauma standpoint.  She did, however, report to the trauma service  on rounds this morning that she had a spell of syncope a few days prior to  her admission.  They have, therefore, called me to evaluate the patient and  decide if further inpatient workup is warranted or if the patient is cleared  medically for discharge home.   I met with Ms. [name] Walker in her hospital room and discussed her situation  with her at length.  She reports that approximately 5 to 6 days ago she was  sitting quietly at the bar in her home painting her fingernails.  She began  to feel funny, and the next thing she knew she was waking up on the floor.  She cannot further describe the sensation that she had.  She has not had  this sensation previously.  She does have a history of falling on many  occasions, but clearly states that previous falls have all been related to  simply catching her feet on things or her legs giving way because of her  severe osteoarthritis.  This episode was different in that the patient  definitely felt unusual, and then the next thing she knew she was waking  up on the floor.  She did  not remember the actual fall at all.  She was not  suffering from significant pain afterwards.  On further detail, she further  reports that she is not sure if she passed out or not on November 27, 2005,  prior to her admission to the hospital.  She reports that she did not get  the feeling before she fell this time, and she cannot specifically remember  why she fell on her most recent fall.  She specifically denies chest pain,  headache, fever, chills, shortness of breath, nausea or vomiting.  There has  been no diarrhea. There has been no upset stomach.  There has been no  unexpected weight loss.  There has been no acute visual change or acute  neurologic change.  The patient has been sleeping well and has been eating  regularly.   REVIEW OF SYSTEMS:  The patient does report significant bilateral lower  extremity edema which she says has worsened over the last  2 weeks. She has a  chronic history of swelling at least in the right lower extremity due to a  previous history of DVT.  She also reports generalized body aches that do  tend to focus on the low back and knees and hips, but they are felt all over  at times.  These she blames on her osteoarthritis.  Comprehensive Review of  Systems is otherwise unremarkable with the exception as noted in History of  Present Illness above.   PAST MEDICAL HISTORY:  1.  Hypertension.  2.  COPD per report but no history of tobacco abuse.  3.  Gastroesophageal reflux disease.  4.  Depression.  5.  Irritable bowel syndrome followed by Dr. Vilinda Boehringer.  6.  Osteoarthritis.  7.  Status post laminectomy in the lumbar region.  8.  Status post bilateral total hip replacement.  9.  Status post bilateral total knee replacement.  10. Status post hysterectomy.  11. History of ischemic colitis.  12. History of right lower extremity DVT on Coumadin.  Exact date of DVT not      clear.  13. Status post cholecystectomy.  14. Right forehead laceration secondary to  fall November 27, 2005.   OUTPATIENT MEDICATIONS:  The patient is not clear on these.  1.  Coumadin 5 mg daily.  2.  Elavil 37.5 mg nightly.  3.  Restoril 30 mg nightly.  4.  Iron sulfate twice daily.  5.  Aricept, unknown dose.  6.  Possibly Atarax p.r.n.   ALLERGIES:  PENICILLIN.   FAMILY HISTORY:  Noncontributory secondary to age.   SOCIAL HISTORY:  The patient lives alone but does have family near that  checks in on her.  She does not smoke  nor has she ever.  She does not drink  alcohol.  She does have a life alert system in her home.   DATA REVIEW:  INR is 1.2, and no further have been obtained during her  observation to date.   PHYSICAL EXAMINATION:  VITAL SIGNS:  Temperature 98.3, blood pressure  138/66, heart rate 72, respiratory rate 20, O2 saturation 90% on room air.  GENERAL: Obese female resting comfortably in hospital stretcher in no acute  respiratory distress.  HEENT:  Normocephalic with approximately 2 inch sutured laceration in the  right eyebrow region. Pupils equal, round, and reactive to light and  accommodation.  Extraocular movements intact bilaterally.  Oral canal and  oropharynx clear.  NECK: Obese, but I am not able to appreciate JVD.  LUNGS: Clear to auscultation bilaterally without wheezes or rhonchi.  CARDIOVASCULAR: Regular rate and rhythm with faint 2/6 holosystolic murmur,  though I am not able to appreciate if it radiates into the subclavian or  carotid region.  ABDOMEN: Obese, soft, bowel sounds positive.  No hepatosplenomegaly, no  rebound, no ascites.  EXTREMITIES: 1+ bilateral lower extremity edema to the knees without  erythema or cyanosis.  NEUROLOGIC: 5/5 strength bilateral upper and lower extremities.  Intact  sensation to touch throughout.  Alert and oriented x4.  Cranial nerves II-  XII intact bilaterally.  Nonfocal neurologic exam.   IMPRESSION AND RECOMMENDATIONS: 1.  Syncope. The patient does in fact describe true syncopal spell       approximately 5 days ago. The events surrounding her fall just last      night are also suspicious for an episode of true syncope.  There are      many potential causes for syncope, and this is not different  in this      particular patient.  She did have some elements of her history that are      concerning, however, with a known prior history of deep vein thrombosis      as well as possible perhaps increased bilateral lower extremity      swelling.  The patient also has a faint systolic murmur which is      concerning.  Given these factors, I feel that inpatient evaluation is      most appropriate. I will initiate workup with an echocardiogram to      evaluate left ventricular systolic function and valvular function.  I      will also obtain bilateral lower extremity Dopplers to rule out deep      vein thrombosis.  Laboratories will be obtained to rule out any      significant electrolyte abnormalities.  B12, folate, and TSH will also      be obtained for metabolic workup.  The patient will be moved to a      telemetry bed so that we can monitor her for the possibility of      arrhythmias, and we will check cardiac panel x1 and also evaluate the      patient with EKGs this afternoon and in the morning.  If this      information is unremarkable, and the patient remains stable on her feet      with physical therapy, she will then be cleared for discharge.  If acute      issues are encountered during this evaluation, they will be addressed as      appropriate.  2.  Hypertension: The patient has a history of hypertension but is not able      to name any true antihypertensive.  Blood pressure is reasonably      controlled in the hospital without medications. We will follow the      patient's blood pressure trend.  We will attempt in the morning to      clarify the patient's medical regimen with her primary care physician.  3.  History of deep vein thrombosis: The patient has a history of  right      lower extremity deep vein thrombosis.  The exact situation in which this      DVT developed are not clear.  The exact timing of this  DVT is not clear      either.  This patient is certainly at exceedingly high risk for ongoing      Coumadin therapy given her age, the fact that she lives alone, and the      fact that she has had multiple falls unrelated to syncope and related to      simple joint immobility.  If we can confirm that she does not have deep      vein thromboses at the present time, and if we can confirm that her deep      vein thromboses were greater than a year ago, it certainly would be      reasonable to consider discontinuation of Coumadin altogether. My      suspicion is that she likely suffered DVT surrounding one of her      multiple lower extremity orthopedic surgeries. If she continues to have      indication for ongoing Coumadin therapy, we will consider discussing the     possibility of at least independent living situation with her so that  she will have greater supervision to decrease the risk of life-      threatening bleeding while on Coumadin.  4.  Right forehead laceration: The patient's forehead laceration is sutured      and appears to be in good shape.  There is no significant erythema or      discharge. Sutures will need to be removed approximately 6 days from      this dictation.  5.  Relative hypoxia: O2 saturation since hospital have been reported at      90%.  This is not normal for a patient who otherwise does not require      any oxygen supplementation whatsoever.  Physical exam does not reveal      any evidence of significant crackles to suggest an infiltrate.  There is      a high possibility of simple atelectasis causing the patient's somewhat      decreased O2 saturation. We must also be concerned about other more      serious factors to include pneumonia or pulmonary embolism.  Laboratory      evaluations are underway now to  assess patient's respiratory function.      Chest x-ray is being obtained.  If the patient's hypoxia persists and      chest x-ray does not reveal any evidence of pneumonia, we will consider      the possibility of spiral CT to rule out pulmonary embolism.   Thank you very much for this consultation on this very pleasant lady.  Ms.  Blayney symptoms are in fact consistent with true syncope, and I do feel  that this warrants further workup.  In that the issues requiring her acute  admission to the trauma service have now be resolved, I am happy to transfer  the care to my service and to convert her to a full admission status.  We  will evaluate her syncope in the inpatient setting.      Lonia Blood, M.D.  Electronically Signed     JTM/MEDQ  D:  11/28/2005  T:  11/28/2005  Job:  [id number]   cc:   Lovenia Kim, D.O.  Fax: [phone]

## 2011-02-09 NOTE — Discharge Summary (Signed)
[place]  Patient:    [name] Walker, [name] Walker Visit Number: [id number] MRN: [id number]          Service Type: MED Location: [phone] Attending Physician:  Orland Mustard Dictated by:   Llana Aliment. Randa Evens, M.D. Admit Date:  12/12/2001 Discharge Date: 12/16/2001                             Discharge Summary  ADMISSION DIAGNOSES: Abdominal pain, rectal bleeding and diarrhea.  DISCHARGE DIAGNOSIS:  Ischemic colitis.  CLINICAL DIAGNOSIS: 1. Ischemic colitis 2. Status post hysterectomy, appendectomy, knee replacement, lumbar    laminectomy, cholecystectomy. 3. History of deep venous thrombosis. 4. Chronic obstructive pulmonary disease, hypertension, reflux, irritable    bowel, anxiety and depression.  PERTINENT HISTORY:  The patient is a 77 year old woman who has had recurrent episodes of ischemic colitis that have occurred despite the fact that she is on chronic Coumadin therapy for previous deep venous thrombosis.  Her last attack was in November 2002.  The patient had a colonoscopy at that time with biopsies confirming ischemic colitis.  Because of her continued episodes of ischemic colitis she has been maintained on Coumadin at low dose which was originally started for previous deep venous thrombosis.  On the day of admission the patient had diarrhea that became bloody with lower abdominal pain.  Physical examination was remarkable for normal vital signs, normal heart, lungs.  Abdomen was obese, soft with positive bowel sounds with guarding and tenderness in the lower abdomen.  Bright red blood on the examining finger.  For more details please see the dictated admission history and physical report.  HOSPITAL COURSE:  The patient was admitted to the hospital and the initial laboratories showed a normal amylase and lipase.  Her INR was 3.0.  White blood cell count was 15,000 and a presumed diagnosis of ischemic colitis was made.  The  patient was started on Tequin and Flagyl, intravenous fluids and slowly improved.  Since she just had a colonoscopy performed we elected not to repeat that at this time.  Abdominal films showed no evidence of free air or bowel obstruction.  She was started on clear liquids which she tolerated well. She was started empirically on Tequin and Flagyl.  Her vital signs remained normal.  Her white blood cell count returned to normal.  On the day prior to her discharge she was tolerating a full liquid diet, we stopped her antibiotics and observed her off antibiotics for 24 hours.  She had no further temperature spikes and her pain had resolved.  It was felt that should could be satisfactorily discharged.  DISPOSITION:  Patient is discharged home.  DISCHARGE MEDICATIONS: 1. Restoril 15 mg q.h.s. 2. Lotensin 40 mg q.d. 3. Doxazosin 2 mg q.d. 4. Coumadin 5 mg q.d. 5. Premarin. 7. Amitriptyline. 8. She will also be given  MiraLax to take one glass daily for two weeks.  FOLLOW UP:  Patient will call to make an appointment for follow up for Dr. Carman Ching in two weeks and will call for further problems. Dictated by:   Llana Aliment. Randa Evens, M.D. Attending Physician:  Orland Mustard DD:  12/16/01 TD:  12/16/01 Job: [id number]

## 2011-02-09 NOTE — Discharge Summary (Signed)
NAMESONITA, MICHIELS NO.:  [id number]   MEDICAL RECORD NO.:  [id number]          PATIENT TYPE:  INP   LOCATION:  4782                         FACILITY:  [facility]   PHYSICIAN:  Elliot Cousin, M.D.    DATE OF BIRTH:  1925-04-17   DATE OF ADMISSION:  11/28/2005  DATE OF DISCHARGE:  12/04/2005                                 DISCHARGE SUMMARY   PRIMARY CARE PHYSICIAN:  Dr. Marisue Brooklyn.   DISCHARGE DIAGNOSES:  1.  Status post fall at home, resulting in right forehead laceration and      hemorrhagic contusion.  2.  History of syncope.  3.  Hypotension, question anaphylactic shock secondary to intravenous      contrast, during the hospital course.  4.  Right lower deep vein thrombosis with a history of right lower extremity      deep vein thrombosis in the past. Status post placement of an inferior      vena cava filter. Coumadin discontinued secondary to fall risk.  5.  Hypertension.  6.  Normocytic anemia.   For secondary discharge diagnoses and past medical history, please see the  dictated history and physical.   DISCHARGE MEDICATIONS:  1.  Do not take amitriptyline and Cardura.  2.  Aspirin 325 milligrams daily.  3.  Restoril 30 milligrams q.h.s.  4.  Ferrous sulfate 325 milligrams b.i.d.  5.  Skelaxin 800 milligrams b.i.d. p.r.n.  6.  Protonix 40 milligrams daily.  7.  Centrum silver vitamin once daily.  8.  Aricept 5 milligrams daily.  9.  Lotensin 10 milligrams daily (new dose, previous dose was 40 milligrams      daily per patient history).   DISCHARGE DISPOSITION:  The patient was discharged to home in improved and  stable condition. She was advised to follow up with Dr. Elisabeth Most in 5-7  days.   CONSULTATIONS:  Interventional radiology.   PROCEDURE PERFORMED:  1.  MRI of the brain on March9,2007: The results revealed atrophy with small      vessel disease type changes. No evidence of acute infarction. Altered      signal intensity in the  medial superior right frontal lobe may represent      a mild hemorrhagic contusion versus the presence of minimal blood      breakdown products from prior ischemia.  2.  Bilateral lower extremity venous Doppler study on March8,2007: The      results revealed evidence of DVT in the right posterior tibial vein to      the popliteal vein. On the left,  no evidence of DVT.  3.  Bilateral carotid Doppler study on March8,2007: No evidence of      significant plaque or ICA stenosis bilaterally.  4.  Status post IVC filter on March10,2007 per interventional radiologist      Dr. Deanne Coffer.   HISTORY OF PRESENT ILLNESS:  The patient is a 77 year old lady, with a past  medical history significant for hypertension, degenerative joint disease,  and right lower extremity DVT, who was admitted by the trauma service after  the patient fell at home resulting in  a right forehead laceration and  hemorrhagic contusion. The patient was monitored by the trauma physicians  overnight. However, prior to her anticipated discharge, the patient gave a  history of a syncopal episode approximately four to five days prior. The  trauma physicians consulted the hospitalist service for further evaluation  and management.   For additional details, please see the excellent history and physical  dictated by Dr. Reather Littler.   HOSPITAL COURSE:  Problem 1: STATUS POST FALL RESULTING IN RIGHT FOREHEAD  LACERATION/HEMORRHAGIC CONTUSION: As stated above, the patient was treated  and monitored by the trauma service. The laceration was sutured. The sutures  were taken out on March12,2007. The scar is healing without evidence of  infection. The patient has no current complaints of headache.   Problem 2:  HISTORY OF SYNCOPE : The patient was alert and oriented at the  time of Dr. Vassie Moment evaluation. She was neurologically intact. However,  given the patient's history of syncope, further evaluation was warranted. A  number of  investigational studies were ordered including a MRI of the brain,  2-D echocardiogram, urinalysis, TSH, vitamin B12, and folate. The MRI of the  brain revealed no evidence of acute infarction; however, there was a small  area on the right frontal lobe which may have represented a mild hemorrhagic  contusion versus presence of minimal blood breakdown products from possible  prior ischemia. Given this finding, Coumadin was discontinued. The results  of the 2-D echocardiogram were significant for a well preserved left  ventricular systolic function with an ejection fraction, ranging from 60-  65%. The echocardiogram revealed no significant valvular abnormalities. The  patient's urinalysis and urine culture were essentially negative. The  vitamin B12 level was within normal limits at 916 and her RBC folate was  elevated at 1461.  The TSH was within normal limits at 2.29. Cardiac enzymes  were also ordered and were negative.   For further evaluation, a bilateral lower extremity venous Doppler study and  a carotid Doppler study were ordered. The carotid Doppler study was negative  for bilateral ICA stenosis. The venous Doppler study was positive for a  right lower extremity DVT. There was a concern regarding ongoing  anticoagulation with Coumadin and the patient's fall risk. Placement of an  IVC filter was recommended. Dr. Brien Few discussed discontinuing Coumadin therapy  with the patient's primary care physician Dr. Elisabeth Most who agreed to a  Coumadin holiday. The patient subsequently underwent  IVC filter placement  on March10,2007. She was also  started on aspirin therapy.   Occupational therapy and physical therapy were both ordered during the  hospital course. With the recommendations of the therapists, the patient  received a rolling walker for home at the time of hospital discharge. The  therapists also recommended home physical therapy and occupational therapy which were ordered. In  addition, a shower bench and a bedside commode were  ordered for home use as well. At the time of hospital discharge, the patient  was ambulating with her rolling walker without any evidence of dizziness,  syncope, or lightheadedness. The exact cause of the patient's syncope  several days prior to hospital admission is unknown at this time. However,  given the propensity of Cardura and amitriptyline to call dizziness, these  medications were discontinued at the time of hospital discharge.   Problem 3: HYPOTENSION- QUESTION ANAPHYLACTIC SHOCK SECONDARY TO IV  CONTRAST: A code blue was called immediately following the placement of the  IVC filter. The patient became  unresponsive and her oxygen saturations fell  into the 80s. Her blood pressure systolically fell into the 80s as well. The  patient  was treated with epinephrine, IV fluids, Solu-Medrol and dopamine.  Following these measures, the patient became much more alert and her blood  pressure responded appropriately. After 48 hours of steroid therapy and IV  fluids, the patient's blood pressures improved to within normal limits. The  patient was also given intravenous fentanyl and Versed during the placement  of the IVC filter. These medications also could have  been the potential  cause of the patient's symptomatology. This is uncertain; however, would  favor a probable anaphylactic reaction to the contrast. The patient was  advised to remember that she is potentially allergic to intravenous  contrast.   Problem 4: RIGHT LOWER EXTREMITY DVT: As stated above, Coumadin was  discontinued secondary to the patient being a high fall risk. The patient is  now status post IVC filter placement. Antiplatelet therapy was started with  aspirin daily. Prophylactic Protonix was prescribed as well. Further  management per Dr. Elisabeth Most.   Problem 5: HYPERTENSION: The patient's blood pressures became mildly  elevated prior to hospital discharge.  However, for the majority of the  hospitalization, the patient's blood pressures were well within normal  limits (with the exception of the hypotensive episode as previously stated).  The patient gives a history of being treated with Lotensin 40 milligrams  daily and Cardura either 2 or 4 milligrams daily. At the time of hospital  discharge, however, she was advised to discontinue the Cardura because of  its propensity to cause dizziness. The dose of Lotensin was decreased to 10  milligrams daily. The patient was given a new prescription . She was advised  to follow up with Dr. Elisabeth Most for further evaluation and management.   Problem 6: NORMOCYTIC ANEMIA: Over the course of the hospitalization, the  patient's hemoglobin fell approximately 1-1/2 to 2 grams. At the time of  hospital admission, her hemoglobin was 12.1, however, it decreased to 10.0  prior to hospital discharge. The patient was started on a multivitamin with iron as well as ferrous sulfate 325 milligrams b.i.d. The patient may need  further evaluation as an outpatient; will defer to Dr. Sarajane Marek, M.D.  Electronically Signed     DF/MEDQ  D:  12/04/2005  T:  12/05/2005  Job:  [id number]

## 2011-06-15 LAB — HEMOGLOBIN AND HEMATOCRIT, BLOOD
HCT: 33.3 — ABNORMAL LOW
HCT: 34.5 — ABNORMAL LOW
Hemoglobin: 11.2 — ABNORMAL LOW

## 2011-06-15 LAB — CBC
HCT: 31.5 — ABNORMAL LOW
Hemoglobin: 10.3 — ABNORMAL LOW
MCHC: 33.7
MCHC: 34.1
MCV: 94.7
MCV: 94.8
Platelets: 189
RBC: 3.7 — ABNORMAL LOW
RDW: 13.3
RDW: 13.5
WBC: 14.1 — ABNORMAL HIGH

## 2011-06-15 LAB — DIFFERENTIAL
Eosinophils Absolute: 0
Eosinophils Relative: 0
Lymphs Abs: 1.1

## 2011-06-15 LAB — CROSSMATCH: Antibody Screen: NEGATIVE

## 2011-06-15 LAB — LIPID PANEL
Cholesterol: 126
HDL: 73
Total CHOL/HDL Ratio: 1.7
VLDL: 10

## 2011-06-15 LAB — COMPREHENSIVE METABOLIC PANEL
ALT: <8
AST: 25
Alkaline Phosphatase: 115
CO2: 29
Chloride: 100
GFR calc Af Amer: >60
GFR calc non Af Amer: 57 — ABNORMAL LOW
Potassium: 4.3
Sodium: 136
Total Bilirubin: 0.8

## 2011-06-15 LAB — PROTIME-INR: Prothrombin Time: 15.7 — ABNORMAL HIGH

## 2011-06-15 LAB — BASIC METABOLIC PANEL
CO2: 29
Calcium: 8.3 — ABNORMAL LOW
Chloride: 107
Creatinine, Ser: 0.66
Glucose, Bld: 96
Sodium: 141

## 2011-06-15 LAB — CARDIAC PANEL(CRET KIN+CKTOT+MB+TROPI)
Relative Index: INVALID
Total CK: 57
Troponin I: 0.02

## 2011-06-15 LAB — ABO/RH: ABO/RH(D): A POS

## 2011-06-18 LAB — CBC
MCV: 93.2
Platelets: 267
RBC: 3.75 — ABNORMAL LOW
WBC: 13.7 — ABNORMAL HIGH

## 2011-06-18 LAB — POCT CARDIAC MARKERS
Myoglobin, poc: 55.4
Operator id: 295131
Troponin i, poc: <0.05

## 2011-06-18 LAB — DIFFERENTIAL
Eosinophils Absolute: 0
Lymphs Abs: 1.1
Monocytes Relative: 5
Neutro Abs: 11.9 — ABNORMAL HIGH
Neutrophils Relative %: 86 — ABNORMAL HIGH

## 2011-06-18 LAB — BASIC METABOLIC PANEL
BUN: 9
CO2: 26
Calcium: 8.9
Creatinine, Ser: 0.87
GFR calc non Af Amer: >60
Glucose, Bld: 153 — ABNORMAL HIGH
Sodium: 140

## 2011-06-18 LAB — URINALYSIS, ROUTINE W REFLEX MICROSCOPIC
Glucose, UA: NEGATIVE
Hgb urine dipstick: NEGATIVE
Protein, ur: NEGATIVE
Specific Gravity, Urine: 1.011
Urobilinogen, UA: 0.2

## 2011-06-18 LAB — HEPATIC FUNCTION PANEL
Bilirubin, Direct: <0.1
Total Bilirubin: 0.4

## 2011-06-18 LAB — LIPASE, BLOOD: Lipase: 19

## 2011-06-25 LAB — CBC
HCT: 33.4 — ABNORMAL LOW
Hemoglobin: 10.8 — ABNORMAL LOW
MCV: 93.3
Platelets: 182
Platelets: 192
RDW: 13.3
RDW: 13.8

## 2011-06-25 LAB — DIFFERENTIAL
Basophils Absolute: 0
Eosinophils Relative: 1
Lymphocytes Relative: 9 — ABNORMAL LOW
Lymphs Abs: 0.9
Monocytes Absolute: 1.2 — ABNORMAL HIGH
Monocytes Relative: 12
Neutro Abs: 7.5

## 2011-06-25 LAB — COMPREHENSIVE METABOLIC PANEL
AST: 27
Albumin: 3.7
BUN: 14
Creatinine, Ser: 0.79
GFR calc Af Amer: >60
Total Protein: 6.9

## 2011-06-25 LAB — URINALYSIS, ROUTINE W REFLEX MICROSCOPIC
Bilirubin Urine: NEGATIVE
Glucose, UA: NEGATIVE
Nitrite: NEGATIVE
Specific Gravity, Urine: 1.013
pH: 6

## 2011-06-25 LAB — BASIC METABOLIC PANEL
Chloride: 100
GFR calc non Af Amer: >60
Glucose, Bld: 116 — ABNORMAL HIGH
Potassium: 4
Sodium: 135

## 2011-06-25 LAB — CARDIAC PANEL(CRET KIN+CKTOT+MB+TROPI)
CK, MB: 2.3
CK, MB: 2.3
Relative Index: 2.3
Relative Index: 2.7 — ABNORMAL HIGH
Relative Index: INVALID
Total CK: 94
Troponin I: 0.01

## 2011-06-25 LAB — URINE MICROSCOPIC-ADD ON

## 2011-06-25 LAB — GLUCOSE, CAPILLARY
Glucose-Capillary: 101 — ABNORMAL HIGH
Glucose-Capillary: 122 — ABNORMAL HIGH
Glucose-Capillary: 129 — ABNORMAL HIGH
Glucose-Capillary: 44 — ABNORMAL LOW

## 2011-06-26 LAB — COMPREHENSIVE METABOLIC PANEL
ALT: 24
AST: 29
Calcium: 8.8
GFR calc Af Amer: >60
Glucose, Bld: 121 — ABNORMAL HIGH
Sodium: 134 — ABNORMAL LOW
Total Protein: 6.5

## 2011-06-26 LAB — CBC
HCT: 28 — ABNORMAL LOW
Hemoglobin: 9.6 — ABNORMAL LOW
MCHC: 33.2
MCV: 90.8
Platelets: 406 — ABNORMAL HIGH
RDW: 13.3
WBC: 5.8

## 2011-06-26 LAB — BASIC METABOLIC PANEL
BUN: 5 — ABNORMAL LOW
Chloride: 106
Potassium: 3 — ABNORMAL LOW

## 2011-06-26 LAB — SEDIMENTATION RATE: Sed Rate: 48 — ABNORMAL HIGH

## 2012-03-02 ENCOUNTER — Encounter (HOSPITAL_COMMUNITY): Payer: Self-pay | Admitting: Emergency Medicine

## 2012-03-02 ENCOUNTER — Inpatient Hospital Stay (HOSPITAL_COMMUNITY)
Admission: EM | Admit: 2012-03-02 | Discharge: 2012-03-06 | DRG: 603 | Disposition: A | Discharge status: Home Health | Payer: Medicare Other | Attending: Internal Medicine | Admitting: Internal Medicine

## 2012-03-02 DIAGNOSIS — R5381 Other malaise: Secondary | ICD-10-CM | POA: Diagnosis present

## 2012-03-02 DIAGNOSIS — L02619 Cutaneous abscess of unspecified foot: Secondary | ICD-10-CM

## 2012-03-02 DIAGNOSIS — M79605 Pain in left leg: Secondary | ICD-10-CM

## 2012-03-02 DIAGNOSIS — J449 Chronic obstructive pulmonary disease, unspecified: Secondary | ICD-10-CM | POA: Diagnosis present

## 2012-03-02 DIAGNOSIS — R748 Abnormal levels of other serum enzymes: Secondary | ICD-10-CM | POA: Diagnosis present

## 2012-03-02 DIAGNOSIS — J4489 Other specified chronic obstructive pulmonary disease: Secondary | ICD-10-CM | POA: Diagnosis present

## 2012-03-02 DIAGNOSIS — L03039 Cellulitis of unspecified toe: Secondary | ICD-10-CM

## 2012-03-02 DIAGNOSIS — L02419 Cutaneous abscess of limb, unspecified: Principal | ICD-10-CM | POA: Diagnosis present

## 2012-03-02 DIAGNOSIS — E669 Obesity, unspecified: Secondary | ICD-10-CM | POA: Diagnosis present

## 2012-03-02 DIAGNOSIS — Z6841 Body Mass Index (BMI) 40.0 and over, adult: Secondary | ICD-10-CM

## 2012-03-02 DIAGNOSIS — L03115 Cellulitis of right lower limb: Secondary | ICD-10-CM | POA: Diagnosis present

## 2012-03-02 DIAGNOSIS — I824Z9 Acute embolism and thrombosis of unspecified deep veins of unspecified distal lower extremity: Secondary | ICD-10-CM | POA: Diagnosis present

## 2012-03-02 DIAGNOSIS — I82403 Acute embolism and thrombosis of unspecified deep veins of lower extremity, bilateral: Secondary | ICD-10-CM | POA: Diagnosis present

## 2012-03-02 DIAGNOSIS — M7989 Other specified soft tissue disorders: Secondary | ICD-10-CM

## 2012-03-02 DIAGNOSIS — E440 Moderate protein-calorie malnutrition: Secondary | ICD-10-CM | POA: Diagnosis present

## 2012-03-02 HISTORY — DX: Gastric ulcer, unspecified as acute or chronic, without hemorrhage or perforation: K25.9

## 2012-03-02 HISTORY — DX: Depression, unspecified: F32.A

## 2012-03-02 HISTORY — DX: Acute embolism and thrombosis of unspecified deep veins of unspecified lower extremity: I82.409

## 2012-03-02 HISTORY — DX: Vascular disorder of intestine, unspecified: K55.9

## 2012-03-02 HISTORY — DX: Major depressive disorder, single episode, unspecified: F32.9

## 2012-03-02 HISTORY — DX: Chronic obstructive pulmonary disease, unspecified: J44.9

## 2012-03-02 HISTORY — DX: Unspecified osteoarthritis, unspecified site: M19.90

## 2012-03-02 HISTORY — DX: Gastro-esophageal reflux disease without esophagitis: K21.9

## 2012-03-02 HISTORY — DX: Essential (primary) hypertension: I10

## 2012-03-02 LAB — BASIC METABOLIC PANEL
BUN: 11 mg/dL (ref 6–23)
CO2: 27 mEq/L (ref 19–32)
Calcium: 8.6 mg/dL (ref 8.4–10.5)
Creatinine, Ser: 0.96 mg/dL (ref 0.50–1.10)
Glucose, Bld: 86 mg/dL (ref 70–99)

## 2012-03-02 LAB — DIFFERENTIAL
Basophils Absolute: 0 10*3/uL (ref 0.0–0.1)
Lymphocytes Relative: 9 % — ABNORMAL LOW (ref 12–46)
Lymphs Abs: 1 10*3/uL (ref 0.7–4.0)
Neutro Abs: 8 10*3/uL — ABNORMAL HIGH (ref 1.7–7.7)
Neutrophils Relative %: 70 % (ref 43–77)

## 2012-03-02 LAB — CBC
HCT: 32.1 % — ABNORMAL LOW (ref 36.0–46.0)
Hemoglobin: 10.9 g/dL — ABNORMAL LOW (ref 12.0–15.0)
MCH: 30.7 pg (ref 26.0–34.0)
MCV: 90.4 fL (ref 78.0–100.0)
Platelets: 205 10*3/uL (ref 150–400)
RBC: 3.55 MIL/uL — ABNORMAL LOW (ref 3.87–5.11)

## 2012-03-02 MED ORDER — ENOXAPARIN SODIUM 100 MG/ML ~~LOC~~ SOLN
1.0000 mg/kg | Freq: Two times a day (BID) | SUBCUTANEOUS | Status: DC
  Filled 2012-03-02 (×2): qty 1

## 2012-03-02 MED ORDER — VITAMIN D 1000 UNITS PO TABS
5000.0000 [IU] | ORAL_TABLET | Freq: Every day | ORAL | Status: DC
  Administered 2012-03-02 – 2012-03-06 (×5): 5000 [IU] via ORAL
  Filled 2012-03-02 (×5): qty 5

## 2012-03-02 MED ORDER — VANCOMYCIN HCL IN DEXTROSE 1-5 GM/200ML-% IV SOLN: 1000.0000 mg | Freq: Two times a day (BID) | INTRAVENOUS | Status: DC

## 2012-03-02 MED ORDER — WARFARIN - PHARMACIST DOSING INPATIENT: Freq: Every day | Status: DC

## 2012-03-02 MED ORDER — TRAMADOL HCL 50 MG PO TABS
50.0000 mg | ORAL_TABLET | Freq: Two times a day (BID) | ORAL | Status: DC | PRN
  Administered 2012-03-03: 50 mg via ORAL
  Filled 2012-03-02: qty 1

## 2012-03-02 MED ORDER — ROPINIROLE HCL 1 MG PO TABS
1.0000 mg | ORAL_TABLET | Freq: Every evening | ORAL | Status: DC | PRN
  Administered 2012-03-04: 2 mg via ORAL
  Filled 2012-03-02: qty 2

## 2012-03-02 MED ORDER — LEVOFLOXACIN IN D5W 500 MG/100ML IV SOLN
500.0000 mg | INTRAVENOUS | Status: DC
  Administered 2012-03-03 – 2012-03-05 (×4): 500 mg via INTRAVENOUS
  Filled 2012-03-02 (×4): qty 100

## 2012-03-02 MED ORDER — WARFARIN SODIUM 7.5 MG PO TABS
7.5000 mg | ORAL_TABLET | Freq: Once | ORAL | Status: AC
  Administered 2012-03-02: 7.5 mg via ORAL
  Filled 2012-03-02: qty 1

## 2012-03-02 MED ORDER — FUROSEMIDE 10 MG/ML IJ SOLN
20.0000 mg | Freq: Every day | INTRAMUSCULAR | Status: DC
  Administered 2012-03-03 – 2012-03-05 (×4): 20 mg via INTRAVENOUS
  Filled 2012-03-02 (×4): qty 2

## 2012-03-02 MED ORDER — POTASSIUM CHLORIDE CRYS ER 20 MEQ PO TBCR
40.0000 meq | EXTENDED_RELEASE_TABLET | Freq: Two times a day (BID) | ORAL | Status: DC
  Administered 2012-03-02: 40 meq via ORAL
  Filled 2012-03-02: qty 2

## 2012-03-02 MED ORDER — DEXTROSE 5 % IV SOLN
1.0000 g | Freq: Once | INTRAVENOUS | Status: AC
  Administered 2012-03-02: 1 g via INTRAVENOUS
  Filled 2012-03-02: qty 10

## 2012-03-02 MED ORDER — POTASSIUM CHLORIDE CRYS ER 20 MEQ PO TBCR
40.0000 meq | EXTENDED_RELEASE_TABLET | Freq: Two times a day (BID) | ORAL | Status: DC
  Administered 2012-03-03 – 2012-03-05 (×6): 40 meq via ORAL
  Filled 2012-03-02 (×6): qty 2

## 2012-03-02 MED ORDER — HYDROXYZINE HCL 25 MG PO TABS
25.0000 mg | ORAL_TABLET | Freq: Three times a day (TID) | ORAL | Status: DC
  Administered 2012-03-02 – 2012-03-06 (×10): 25 mg via ORAL
  Filled 2012-03-02 (×13): qty 1

## 2012-03-02 MED ORDER — ENOXAPARIN SODIUM 60 MG/0.6ML ~~LOC~~ SOLN
1.0000 mg/kg | Freq: Two times a day (BID) | SUBCUTANEOUS | Status: DC
  Administered 2012-03-02: 90 mg via SUBCUTANEOUS
  Filled 2012-03-02 (×4): qty 0.3

## 2012-03-02 MED ORDER — ACETAMINOPHEN 325 MG PO TABS
650.0000 mg | ORAL_TABLET | Freq: Four times a day (QID) | ORAL | Status: DC | PRN
  Administered 2012-03-03: 650 mg via ORAL
  Filled 2012-03-02: qty 2

## 2012-03-02 MED ORDER — AMITRIPTYLINE HCL 75 MG PO TABS
225.0000 mg | ORAL_TABLET | Freq: Every day | ORAL | Status: DC
  Administered 2012-03-02 – 2012-03-05 (×4): 225 mg via ORAL
  Filled 2012-03-02 (×5): qty 3

## 2012-03-02 MED ORDER — ACETAMINOPHEN 650 MG RE SUPP: 650.0000 mg | Freq: Four times a day (QID) | RECTAL | Status: DC | PRN

## 2012-03-02 MED ORDER — VANCOMYCIN HCL 1000 MG IV SOLR
750.0000 mg | Freq: Two times a day (BID) | INTRAVENOUS | Status: DC
  Administered 2012-03-02 – 2012-03-05 (×7): 750 mg via INTRAVENOUS
  Filled 2012-03-02 (×8): qty 750

## 2012-03-02 NOTE — ED Notes (Signed)
MD bedside

## 2012-03-02 NOTE — Progress Notes (Signed)
ANTIBIOTIC CONSULT NOTE - INITIAL  Pharmacy Consult for Vancomycin Indication: Cellulitis  Allergies  Allergen Reactions  . Iohexol      Desc: THROAT SWELLING-NOTED IN CHART AFTER IVC PLACEMENT-ARS 12-02-05   . Latex   . Penicillins    Patient Measurements: Weight: 200 lb (90.719 kg)  Vital Signs: BP: 119/52 mmHg (06/09 2030) Pulse Rate: 92  (06/09 2030) Intake/Output from previous day:   Intake/Output from this shift:    Labs:  Basename 03/02/12 1815  WBC 11.4*  HGB 10.9*  PLT 205  LABCREA --  CREATININE 0.96   The CrCl is unknown because both a height and weight (above a minimum accepted value) are required for this calculation. No results found for this basename: VANCOTROUGH:2,VANCOPEAK:2,VANCORANDOM:2,GENTTROUGH:2,GENTPEAK:2,GENTRANDOM:2,TOBRATROUGH:2,TOBRAPEAK:2,TOBRARND:2,AMIKACINPEAK:2,AMIKACINTROU:2,AMIKACIN:2, in the last 72 hours   Microbiology: No results found for this or any previous visit (from the past 720 hour(s)).  Medical History: Past Medical History  Diagnosis Date  . DVT (deep venous thrombosis)   . Hypertension   . COPD (chronic obstructive pulmonary disease)   . GERD (gastroesophageal reflux disease)   . Osteoarthritis   . Ischemic colitis   . Depression   . Gastric erosion    Medications:   (Not in a hospital admission) Anti-infectives     Start     Dose/Rate Route Frequency Ordered Stop   03/02/12 2100   vancomycin (VANCOCIN) IVPB 1000 mg/200 mL premix  Status:  Discontinued        1,000 mg 200 mL/hr over 60 Minutes Intravenous Every 12 hours 03/02/12 2049 03/02/12 2054   03/02/12 2100   levofloxacin (LEVAQUIN) IVPB 500 mg        500 mg 100 mL/hr over 60 Minutes Intravenous Every 24 hours 03/02/12 2049     03/02/12 1930   cefTRIAXone (ROCEPHIN) 1 g in dextrose 5 % 50 mL IVPB        1 g 100 mL/hr over 30 Minutes Intravenous  Once 03/02/12 1922 03/02/12 2012         Assessment:  78yo F with L leg swelling, pain, possible  cellulitis. On Levaquin PTA.  Continuing Levaquin IV.  Adding Vancomycin.  SCr 1, CrCl ~49N.  Goal of Therapy:  Vancomycin trough level 10-15 mcg/ml  Plan:   Vanc 750mg  IV q12h. Measure Vanc trough at steady state. Follow up renal fxn and culture results.  Charolotte Eke, PharmD, pager [phone]. 03/02/2012,8:59 PM.

## 2012-03-02 NOTE — Progress Notes (Addendum)
ANTICOAGULATION CONSULT NOTE - Initial Consult  Pharmacy Consult for Lovenox, Coumadin Indication: DVT  Allergies  Allergen Reactions  . Iohexol      Desc: THROAT SWELLING-NOTED IN CHART AFTER IVC PLACEMENT-ARS 12-02-05   . Latex   . Penicillins    Patient Measurements: Weight: 200 lb (90.719 kg)  Vital Signs: BP: 119/52 mmHg (06/09 2030) Pulse Rate: 92  (06/09 2030)  Labs:  Basename 03/02/12 1815  HGB 10.9*  HCT 32.1*  PLT 205  APTT --  LABPROT 18.6*  INR 1.52*  HEPARINUNFRC --  CREATININE 0.96  CKTOTAL --  CKMB --  TROPONINI --    The CrCl is unknown because both a height and weight (above a minimum accepted value) are required for this calculation.  Medical History: Past Medical History  Diagnosis Date  . DVT (deep venous thrombosis)   . Hypertension   . COPD (chronic obstructive pulmonary disease)   . GERD (gastroesophageal reflux disease)   . Osteoarthritis   . Ischemic colitis   . Depression   . Gastric erosion    Medications:   (Not in a hospital admission)  Assessment:  78yo F on chronic Coumadin for DVT, presents with worsening swelling and pain of the left leg.  Coumadin regimen PTA, 2.5mg  and 5mg  on alternating days. INR is subtherapeutic.  Starting Lovenox.   CrCl >30.  Goal of Therapy:  INR 2-3 Monitor platelets by anticoagulation protocol: Yes   Plan:   Lovenox 1mg /kg SQ q12h.  Coumadin 7.5mg  tonight.  F/u daily.  Charolotte Eke, PharmD, pager [phone]. 03/02/2012,8:49 PM.

## 2012-03-02 NOTE — ED Notes (Signed)
Per EMS: Pt w/ hx of bilateral DVTs.  Goes to a wound center (Dr. Tanda Rockers) for seeping edematous legs.  Both legs are purple and discolored.  Lt leg is more edematous, tight, and warm to the touch.

## 2012-03-02 NOTE — ED Notes (Signed)
Pt coming from home (lives by herself).  Hx of DVTs in both legs.  Was seeing Dr. Tanda Rockers for wound care due to the bilateral seeping/edema in her legs.  Had her lt leg wrapped last Wednesday and when she took the wrap off today, her lower leg was tight and edematous.  C/o pain 10/10 in her upper lt leg (between groin and knee)

## 2012-03-02 NOTE — ED Notes (Signed)
Pt complaining of pain in lt leg between groin and knee.  Swelling to lower leg.

## 2012-03-02 NOTE — H&P (Signed)
[name] Walker is an 78 y.o. female.   Chief Complaint: left leg pain HPI: 78 yo female with hx of copd, dvt, apparently c/o left leg pain with walking x 3-4 days.  Pt has edema.  Pt has cellulitis presently on levaquin for several days ?Marland Kitchen  Pt is not sure who prescribed it, and can't recall ,  She is a very poor historian.  ER requests that we admit her for cellulitis failing outpatient levaquin, and also edema, and leg pain.  Pt denies cp, palp, sob, n/v, diarrhea, brbpr, black stool.    Past Medical History  Diagnosis Date  . DVT (deep venous thrombosis)   . Hypertension   . COPD (chronic obstructive pulmonary disease)   . GERD (gastroesophageal reflux disease)   . Osteoarthritis   . Ischemic colitis   . Depression   . Gastric erosion     Past Surgical History  Procedure Date  . Total hip arthroplasty     bilateral  . Knee arthroplasty     bilateral  . Esophagogastroduodenoscopy 10/17/2007    erosion   . Colonoscopy   . Back surgery   . Abdominal hysterectomy   . Cholecystectomy   . Cataracts     Family History  Problem Relation Age of Onset  . Rheum arthritis Mother   . Ulcers Father    Social History:  reports that she has never smoked. She has never used smokeless tobacco. She reports that she does not drink alcohol or use illicit drugs.  Allergies:  Allergies  Allergen Reactions  . Iohexol      Desc: THROAT SWELLING-NOTED IN CHART AFTER IVC PLACEMENT-ARS 12-02-05   . Latex   . Penicillins      (Not in a hospital admission)  Results for orders placed during the hospital encounter of 03/02/12 (from the past 48 hour(s))  PROTIME-INR     Status: Abnormal   Collection Time   03/02/12  6:15 PM      Component Value Range Comment   Prothrombin Time 18.6 (*) 11.6 - 15.2 (seconds)    INR 1.52 (*) 0.00 - 1.49    CBC     Status: Abnormal   Collection Time   03/02/12  6:15 PM      Component Value Range Comment   WBC 11.4 (*) 4.0 - 10.5 (K/uL)    RBC 3.55 (*) 3.87  - 5.11 (MIL/uL)    Hemoglobin 10.9 (*) 12.0 - 15.0 (g/dL)    HCT 37.6 (*) 28.3 - 46.0 (%)    MCV 90.4  78.0 - 100.0 (fL)    MCH 30.7  26.0 - 34.0 (pg)    MCHC 34.0  30.0 - 36.0 (g/dL)    RDW 15.1 (*) 76.1 - 15.5 (%)    Platelets 205  150 - 400 (K/uL)   BASIC METABOLIC PANEL     Status: Abnormal   Collection Time   03/02/12  6:15 PM      Component Value Range Comment   Sodium 134 (*) 135 - 145 (mEq/L)    Potassium 3.1 (*) 3.5 - 5.1 (mEq/L)    Chloride 95 (*) 96 - 112 (mEq/L)    CO2 27  19 - 32 (mEq/L)    Glucose, Bld 86  70 - 99 (mg/dL)    BUN 11  6 - 23 (mg/dL)    Creatinine, Ser 6.07  0.50 - 1.10 (mg/dL)    Calcium 8.6  8.4 - 10.5 (mg/dL)    GFR calc non  Af Amer 53 (*) >90 (mL/min)    GFR calc Af Amer 61 (*) >90 (mL/min)    No results found.  Review of Systems  Constitutional: Negative for fever, chills, weight loss, malaise/fatigue and diaphoresis.  HENT: Negative for hearing loss, ear pain, nosebleeds, congestion, neck pain, tinnitus and ear discharge.   Eyes: Negative for blurred vision, double vision, photophobia, pain, discharge and redness.  Respiratory: Negative for cough, hemoptysis, sputum production, shortness of breath and wheezing.   Cardiovascular: Negative for chest pain, palpitations, orthopnea, claudication, leg swelling and PND.  Gastrointestinal: Negative for heartburn, nausea, vomiting, abdominal pain, diarrhea, constipation, blood in stool and melena.  Genitourinary: Negative for dysuria, urgency, frequency and hematuria.  Musculoskeletal: Positive for myalgias, joint pain and falls. Negative for back pain.  Skin: Negative for itching and rash.  Neurological: Negative for dizziness, tingling, tremors, sensory change, speech change, focal weakness, seizures, loss of consciousness, weakness and headaches.  Endo/Heme/Allergies: Negative for environmental allergies and polydipsia. Does not bruise/bleed easily.  Psychiatric/Behavioral: Negative for depression,  suicidal ideas, hallucinations and substance abuse.    Blood pressure 127/42, pulse 93, resp. rate 19, weight 90.719 kg (200 lb), SpO2 95.00%. Physical Exam  Constitutional: She is oriented to person, place, and time. She appears well-developed and well-nourished. No distress.  HENT:  Head: Normocephalic and atraumatic.  Mouth/Throat: No oropharyngeal exudate.  Eyes: Conjunctivae are normal. Pupils are equal, round, and reactive to light. Right eye exhibits no discharge. Left eye exhibits no discharge. No scleral icterus.  Neck: Normal range of motion. Neck supple. No JVD present. No tracheal deviation present. No thyromegaly present.  Cardiovascular: Normal rate, regular rhythm, normal heart sounds and intact distal pulses.  Exam reveals no gallop and no friction rub.   No murmur heard. Respiratory: Effort normal and breath sounds normal. No stridor. No respiratory distress. She has no wheezes. She has no rales. She exhibits no tenderness.  GI: Soft. Bowel sounds are normal. She exhibits no distension and no mass. There is no tenderness. There is no rebound and no guarding.  Musculoskeletal: Normal range of motion. She exhibits edema. She exhibits no tenderness.  Lymphadenopathy:    She has no cervical adenopathy.  Neurological: She is alert and oriented to person, place, and time. She displays normal reflexes. No cranial nerve deficit. She exhibits normal muscle tone. Coordination normal.  Skin: Skin is warm and dry. No rash noted. She is not diaphoretic. There is erythema. No pallor.  Psychiatric: She has a normal mood and affect. Her behavior is normal. Judgment and thought content normal.     Assessment/Plan L leg pain: u/s in am r/o DVT due to swelling left >right  Edema: Start on lasix 20mg  iv qday Ted hose Elevate legs  DVT: lovenox til coumadin therapeutic Pharmacy to dose  Cellulitis: failed outpatient levaquin Start on vanco, levaquin iv  Hypokalemia:  Potassium given  in ED cmp in am  Gerd: ppi  DVT prophylaxis: lovenox, coumadin Anemia: repeat cbc in am   Pearson Grippe 03/02/2012, 8:37 PM

## 2012-03-02 NOTE — ED Provider Notes (Signed)
History     CSN: [id number]  Arrival date & time 03/02/12  1637   First MD Initiated Contact with Patient 03/02/12 1718      Chief Complaint  Patient presents with  . Leg Pain  . Leg Swelling    (Consider location/radiation/quality/duration/timing/severity/associated sxs/prior treatment) HPI Complains of worsening swelling and pain at left lower extremity for the past 3-4 days pain is worse with weightbearing mproved with remaining still. No treatment prior to coming here. No fever no treatment prior to coming here. By EMS pain is mild at present Past Medical History  Diagnosis Date  . DVT (deep venous thrombosis)   . Hypertension     History reviewed. No pertinent past surgical history.  History reviewed. No pertinent family history.  History  Substance Use Topics  . Smoking status: Never Smoker   . Smokeless tobacco: Never Used  . Alcohol Use: No    OB History    Grav Para Term Preterm Abortions TAB SAB Ect Mult Living                  Review of Systems  Musculoskeletal: Positive for myalgias.  All other systems reviewed and are negative.    Allergies  Iohexol; Latex; and Penicillins  Home Medications   Current Outpatient Rx  Name Route Sig Dispense Refill  . AMITRIPTYLINE HCL 75 MG PO TABS Oral Take 225 mg by mouth at bedtime.    Marland Kitchen VITAMIN D 1000 UNITS PO CAPS Oral Take 5,000 Units by mouth daily.    Marland Kitchen GABAPENTIN 300 MG PO CAPS Oral Take 300 mg by mouth 2 (two) times daily.    Marland Kitchen HYDROXYZINE HCL 25 MG PO TABS Oral Take 25 mg by mouth 3 (three) times daily.    Marland Kitchen LEVOFLOXACIN 500 MG PO TABS Oral Take 500 mg by mouth daily. For 7 days.    Marland Kitchen POTASSIUM CHLORIDE CRYS ER 20 MEQ PO TBCR Oral Take 20 mEq by mouth 2 (two) times daily.    . TRAMADOL HCL 50 MG PO TABS Oral Take 50 mg by mouth 2 (two) times daily as needed. Pain.    . WARFARIN SODIUM 5 MG PO TABS Oral Take 2.5-5 mg by mouth See admin instructions. Take 2.5mg  every other day. Take 5mg  on alternate  days.    Marland Kitchen ROPINIROLE HCL 1 MG PO TABS Oral Take 1-2 mg by mouth See admin instructions. Take 1 to 2 tablets by mouth at bedtime for restless legs.      BP 145/72  Pulse 87  Resp 18  SpO2 97%  Physical Exam  Nursing note and vitals reviewed. Constitutional: She appears well-developed and well-nourished.  HENT:  Head: Normocephalic and atraumatic.  Eyes: Conjunctivae are normal. Pupils are equal, round, and reactive to light.  Neck: Neck supple. No tracheal deviation present. No thyromegaly present.  Cardiovascular: Normal rate and regular rhythm.   No murmur heard. Pulmonary/Chest: Effort normal and breath sounds normal.  Abdominal: Soft. Bowel sounds are normal. She exhibits no distension. There is no tenderness.       Obese  Musculoskeletal: Normal range of motion. She exhibits no edema and no tenderness.       Right lower extremity with brawny skin changes, chronic appearing below the knee. In stocking fashion at calfnd lower leg. DP pulse 2+ Left lower extremity with brawny skin changes at the calf and shin below the knee in stocking glove fashion with a red streak at posterior lateral thigh which is warm  and tender DP pulse 2+ Left lower extremity is markedly more swollen than right  Neurological: She is alert. Coordination normal.  Skin: Skin is warm and dry. No rash noted.  Psychiatric: She has a normal mood and affect.    ED Course  Procedures (including critical care time)  Labs Reviewed - No data to display No results found.   No diagnosis found. Declined pain medicine presently. Unable to obtain Doppler studies of legs due to time of day   MDM  Spoke with Dr. Selena Batten plan 23 observation, intravenous antibiotics, Lovenox therapy Diagnosis #1 left lower extremity pain Differential diagnosis includes phlebitis versus cellulitis with a ascending lymphangitis #2 subtherapeutic warfarin therapy #3 hypokalemia        Doug Sou, MD 03/02/12 1956

## 2012-03-03 DIAGNOSIS — I749 Embolism and thrombosis of unspecified artery: Secondary | ICD-10-CM

## 2012-03-03 DIAGNOSIS — I82403 Acute embolism and thrombosis of unspecified deep veins of lower extremity, bilateral: Secondary | ICD-10-CM | POA: Diagnosis present

## 2012-03-03 DIAGNOSIS — L03039 Cellulitis of unspecified toe: Secondary | ICD-10-CM

## 2012-03-03 DIAGNOSIS — M79609 Pain in unspecified limb: Secondary | ICD-10-CM

## 2012-03-03 DIAGNOSIS — L02619 Cutaneous abscess of unspecified foot: Secondary | ICD-10-CM

## 2012-03-03 DIAGNOSIS — M7989 Other specified soft tissue disorders: Secondary | ICD-10-CM

## 2012-03-03 LAB — COMPREHENSIVE METABOLIC PANEL
AST: 36 U/L (ref 0–37)
Albumin: 2.5 g/dL — ABNORMAL LOW (ref 3.5–5.2)
CO2: 28 mEq/L (ref 19–32)
Calcium: 8.7 mg/dL (ref 8.4–10.5)
Creatinine, Ser: 0.92 mg/dL (ref 0.50–1.10)
GFR calc non Af Amer: 56 mL/min — ABNORMAL LOW (ref >90)

## 2012-03-03 LAB — CBC
Hemoglobin: 10.6 g/dL — ABNORMAL LOW (ref 12.0–15.0)
MCH: 30.1 pg (ref 26.0–34.0)
RBC: 3.52 MIL/uL — ABNORMAL LOW (ref 3.87–5.11)

## 2012-03-03 LAB — GAMMA GT: GGT: 271 U/L — ABNORMAL HIGH (ref 7–51)

## 2012-03-03 LAB — PROTIME-INR: Prothrombin Time: 18.9 seconds — ABNORMAL HIGH (ref 11.6–15.2)

## 2012-03-03 MED ORDER — WARFARIN SODIUM 7.5 MG PO TABS
7.5000 mg | ORAL_TABLET | Freq: Once | ORAL | Status: AC
  Administered 2012-03-03: 7.5 mg via ORAL
  Filled 2012-03-03: qty 1

## 2012-03-03 MED ORDER — ENOXAPARIN SODIUM 100 MG/ML ~~LOC~~ SOLN
90.0000 mg | Freq: Two times a day (BID) | SUBCUTANEOUS | Status: DC
  Administered 2012-03-03 – 2012-03-06 (×7): 90 mg via SUBCUTANEOUS
  Filled 2012-03-03 (×9): qty 1

## 2012-03-03 NOTE — Progress Notes (Addendum)
Patient ID: [name] Walker  female  BJY:[id number]    DOB: 1924-10-02    DOA: 03/02/2012  PCP: Elisabeth Most, AMY, DO, DO  Subjective: Poor historian, bilateral leg pain and cellulitis  Objective: Weight change:   Intake/Output Summary (Last 24 hours) at 03/03/12 1514 Last data filed at 03/03/12 0051  Gross per 24 hour  Intake    250 ml  Output      0 ml  Net    250 ml   Blood pressure 109/44, pulse 84, temperature 98.2 F (36.8 C), temperature source Oral, resp. rate 18, height 4\' 11"  (1.499 m), weight 95.573 kg (210 lb 11.2 oz), SpO2 98.00%.  Physical Exam: General: Alert and awake, oriented x3, not in any acute distress. HEENT: anicteric sclera, pupils reactive to light and accommodation, EOMI CVS: S1-S2 clear, no murmur rubs or gallops Chest: clear to auscultation bilaterally, no wheezing, rales or rhonchi Abdomen: Obese, soft nontender, nondistended, normal bowel sounds, no organomegaly Extremities: Erythema bilaterally with chronic venous status changes, edema, Skin: Intertriginous erythema and moist with skin damage beneath the breasts and abdominal skin folds, stage I sacral decub  Lab Results: Basic Metabolic Panel:  Lab 03/03/12 0865 03/02/12 1815  NA 137 134*  K 3.3* 3.1*  CL 98 95*  CO2 28 27  GLUCOSE 91 86  BUN 10 11  CREATININE 0.92 0.96  CALCIUM 8.7 8.6  MG -- --  PHOS -- --   Liver Function Tests:  Lab 03/03/12 0406  AST 36  ALT 52*  ALKPHOS 280*  BILITOT 0.8  PROT 6.2  ALBUMIN 2.5*  CBC:  Lab 03/03/12 0406 03/02/12 1815  WBC 10.2 11.4*  NEUTROABS -- 8.0*  HGB 10.6* 10.9*  HCT 32.3* 32.1*  MCV 91.8 90.4  PLT 223 205    Studies/Results: No results found.  Medications: Scheduled Meds:   . amitriptyline  225 mg Oral QHS  . cefTRIAXone (ROCEPHIN)  IV  1 g Intravenous Once  . cholecalciferol  5,000 Units Oral Daily  . enoxaparin (LOVENOX) injection  90 mg Subcutaneous Q12H  . furosemide  20 mg Intravenous Daily  . hydrOXYzine  25 mg  Oral TID  . levofloxacin (LEVAQUIN) IV  500 mg Intravenous Q24H  . potassium chloride  40 mEq Oral BID  . vancomycin  750 mg Intravenous Q12H  . warfarin  7.5 mg Oral Once  . warfarin  7.5 mg Oral ONCE-1800  . Warfarin - Pharmacist Dosing Inpatient   Does not apply q1800  . DISCONTD: enoxaparin (LOVENOX) injection  1 mg/kg Subcutaneous Q12H  . DISCONTD: enoxaparin  1 mg/kg Subcutaneous Q12H  . DISCONTD: potassium chloride  40 mEq Oral BID  . DISCONTD: vancomycin  1,000 mg Intravenous Q12H   Continuous Infusions:    Assessment/Plan: Principal Problem:  *Cellulitis of lower extremities, intertriginous areas: -Continue IV vancomycin and Levaquin - Wound care consult  Active Problems:  DVT of lower extremity, bilateral - Placed on therapeutic Lovenox and Coumadin per pharmacy  Elevated alkaline phosphatase: - Obtain GGT, 5'NT, no acute abdominal issues - Abd [place] in AM  Moderate malnutrition with albumin of 2.5: - Obtain nutrition consult  COPD: Stable for now  Generalized debility: - Will obtain PT and OT eval, may need higher level of care, lives alone, appears to be somewhat resistant to skilled nursing facility  DVT Prophylaxis: On therapeutic Lovenox and Coumadin  Code Status:  Disposition: Not medically ready   LOS: 1 day   Satonya Lux M.D. [facility] 03/03/2012, 3:14 PM  Pager: [phone]

## 2012-03-03 NOTE — Consult Note (Signed)
WOC consult Note Reason for Consult: Intertriginous (moisture associated skin damage) ITD between and beneath breasts and between abdominal skin fold Wound type:MASD Pressure Ulcer POA: No Wound ZOX:WRUEA, pink, moist Drainage (amount, consistency, odor) none Periwound:intact Dressing procedure/placement/frequency:InterDry textile impregnated with silver to absorb, wick and treat the area of breakdown.  Instructions for use provided with order. I will not follow.  Please re-consult if needed. Thanks, Ladona Mow, MSN, RN, [facility], CWOCN [phone])

## 2012-03-03 NOTE — Progress Notes (Signed)
Pt has large reddened areas under bil breasts, rt>lt, folds of abdomen red with some weeping especially the rt side, coccyx area red, lle discolored and 4+ edema, pulses present, rle discolored with 1+ edema, bil heels red

## 2012-03-03 NOTE — Progress Notes (Signed)
ANTICOAGULATION CONSULT NOTE - Initial Consult  Pharmacy Consult for Lovenox, Coumadin Indication: DVT  Allergies  Allergen Reactions  . Iohexol      Desc: THROAT SWELLING-NOTED IN CHART AFTER IVC PLACEMENT-ARS 12-02-05   . Latex   . Penicillins    Patient Measurements: Height: 4\' 11"  (149.9 cm) Weight: 210 lb 11.2 oz (95.573 kg) IBW/kg (Calculated) : 43.2   Vital Signs: Temp: 98.2 F (36.8 C) (06/10 0650) Temp src: Oral (06/10 0650) BP: 109/44 mmHg (06/10 0650) Pulse Rate: 84  (06/10 0650)  Labs:  Basename 03/03/12 0406 03/02/12 1815  HGB 10.6* 10.9*  HCT 32.3* 32.1*  PLT 223 205  APTT -- --  LABPROT 18.9* 18.6*  INR 1.55* 1.52*  HEPARINUNFRC -- --  CREATININE 0.92 0.96  CKTOTAL -- --  CKMB -- --  TROPONINI -- --    Estimated Creatinine Clearance: 46.1 ml/min (by C-G formula based on Cr of 0.92).  Medical History: Past Medical History  Diagnosis Date  . DVT (deep venous thrombosis)   . Hypertension   . COPD (chronic obstructive pulmonary disease)   . GERD (gastroesophageal reflux disease)   . Osteoarthritis   . Ischemic colitis   . Depression   . Gastric erosion    Medications:  Prescriptions prior to admission  Medication Sig Dispense Refill  . amitriptyline (ELAVIL) 75 MG tablet Take 225 mg by mouth at bedtime.      . Cholecalciferol (VITAMIN D) 1000 UNITS capsule Take 5,000 Units by mouth daily.      Marland Kitchen gabapentin (NEURONTIN) 300 MG capsule Take 300 mg by mouth 2 (two) times daily.      . hydrOXYzine (ATARAX/VISTARIL) 25 MG tablet Take 25 mg by mouth 3 (three) times daily.      Marland Kitchen levofloxacin (LEVAQUIN) 500 MG tablet Take 500 mg by mouth daily. For 7 days.      . potassium chloride SA (K-DUR,KLOR-CON) 20 MEQ tablet Take 20 mEq by mouth 2 (two) times daily.      . traMADol (ULTRAM) 50 MG tablet Take 50 mg by mouth 2 (two) times daily as needed. Pain.      . warfarin (COUMADIN) 5 MG tablet Take 2.5-5 mg by mouth See admin instructions. Take 2.5mg  every  other day. Take 5mg  on alternate days.      Marland Kitchen rOPINIRole (REQUIP) 1 MG tablet Take 1-2 mg by mouth See admin instructions. Take 1 to 2 tablets by mouth at bedtime for restless legs.        Assessment:  78yo F on chronic Coumadin for DVT, admitted with worsening swelling and pain of the left leg.  Coumadin regimen PTA reported as 2.5mg  and 5mg  on alternating days. INR subtherapeutic on admission.  Full-dose Lovenox was started and warfarin was continued, pharmacy to assist with dosing.  INR subtherapeutic today.  Warfarin 7.5mg  booster dose was administered last night.  Concomitant levofloxacin noted - can increase INR response on warfarin.  No bleeding reported in chart notes.  Goal of Therapy:  INR 2-3 Monitor platelets by anticoagulation protocol: Yes   Plan:   Repeat warfarin 7.5mg  PO booster dose x1 tonight.  Continue full-dose Lovenox (1mg /kg SQ q12h) until INR therapeutic.  Hope Budds, PharmD, BCPS Pager: [phone] 03/03/2012  10:30 AM

## 2012-03-03 NOTE — Progress Notes (Signed)
Bilateral lower extremity venous duplex completed.  Preliminary report is positive for DVT in the right distal femoral vein and left common femoral vein.  Superficial thrombus noted in the left greater saphenous vein.  ABI not performed due to positive DVT.  Bilateral posterior tibial arteries demonstrate triphasic waveforms.

## 2012-03-04 ENCOUNTER — Inpatient Hospital Stay (HOSPITAL_COMMUNITY): Payer: Medicare Other

## 2012-03-04 DIAGNOSIS — I749 Embolism and thrombosis of unspecified artery: Secondary | ICD-10-CM

## 2012-03-04 DIAGNOSIS — L02619 Cutaneous abscess of unspecified foot: Secondary | ICD-10-CM

## 2012-03-04 DIAGNOSIS — M7989 Other specified soft tissue disorders: Secondary | ICD-10-CM

## 2012-03-04 DIAGNOSIS — L03039 Cellulitis of unspecified toe: Secondary | ICD-10-CM

## 2012-03-04 LAB — CBC
MCV: 91.6 fL (ref 78.0–100.0)
Platelets: 233 10*3/uL (ref 150–400)
RBC: 3.56 MIL/uL — ABNORMAL LOW (ref 3.87–5.11)
RDW: 15.7 % — ABNORMAL HIGH (ref 11.5–15.5)
WBC: 8.5 10*3/uL (ref 4.0–10.5)

## 2012-03-04 LAB — BASIC METABOLIC PANEL
CO2: 29 mEq/L (ref 19–32)
Chloride: 100 mEq/L (ref 96–112)
Creatinine, Ser: 0.78 mg/dL (ref 0.50–1.10)
GFR calc Af Amer: 87 mL/min — ABNORMAL LOW (ref >90)
Sodium: 138 mEq/L (ref 135–145)

## 2012-03-04 LAB — PROTIME-INR
INR: 2.13 — ABNORMAL HIGH (ref 0.00–1.49)
Prothrombin Time: 24.2 seconds — ABNORMAL HIGH (ref 11.6–15.2)

## 2012-03-04 LAB — VANCOMYCIN, TROUGH: Vancomycin Tr: 12.2 ug/mL (ref 10.0–20.0)

## 2012-03-04 MED ORDER — WARFARIN SODIUM 5 MG PO TABS
5.0000 mg | ORAL_TABLET | Freq: Once | ORAL | Status: AC
  Administered 2012-03-04: 5 mg via ORAL
  Filled 2012-03-04: qty 1

## 2012-03-04 MED ORDER — ENSURE COMPLETE PO LIQD
237.0000 mL | Freq: Two times a day (BID) | ORAL | Status: DC
  Administered 2012-03-05 – 2012-03-06 (×3): 237 mL via ORAL

## 2012-03-04 MED ORDER — COUMADIN BOOK
Freq: Once | Status: AC
  Administered 2012-03-04: 12:00:00
  Filled 2012-03-04: qty 1

## 2012-03-04 MED ORDER — WARFARIN VIDEO
Freq: Once | Status: AC
  Administered 2012-03-04: 12:00:00

## 2012-03-04 NOTE — Progress Notes (Signed)
Patient ID: [name] Walker  female  AVW:[id number]    DOB: [birth year]    DOA: 03/02/2012  PCP: Elisabeth Most, AMY, DO, DO  Subjective: Poor historian, no acute issues  Objective: Weight change:   Intake/Output Summary (Last 24 hours) at 03/04/12 1134 Last data filed at 03/04/12 0858  Gross per 24 hour  Intake    480 ml  Output      0 ml  Net    480 ml   Blood pressure 106/61, pulse 84, temperature 98.6 F (37 C), temperature source Oral, resp. rate 22, height 4\' 11"  (1.499 m), weight 95.573 kg (210 lb 11.2 oz), SpO2 96.00%.  Physical Exam: General: Alert and awake, oriented x3, not in any acute distress. HEENT: anicteric sclera, pupils reactive to light and accommodation, EOMI CVS: S1-S2 clear, no murmur rubs or gallops Chest: clear to auscultation bilaterally, no wheezing, rales or rhonchi Abdomen: Obese, soft nontender, nondistended, normal bowel sounds, no organomegaly Extremities: Erythema bilaterally with chronic venous status changes,  Skin: Intertriginous erythema and moist with skin damage beneath the breasts and abdominal skin folds, stage I sacral decub  Lab Results: Basic Metabolic Panel:  Lab 03/04/12 8295 03/03/12 0406  NA 138 137  K 3.9 3.3*  CL 100 98  CO2 29 28  GLUCOSE 112* 91  BUN 9 10  CREATININE 0.78 0.92  CALCIUM 8.7 8.7  MG -- --  PHOS -- --   Liver Function Tests:  Lab 03/03/12 0406  AST 36  ALT 52*  ALKPHOS 280*  BILITOT 0.8  PROT 6.2  ALBUMIN 2.5*  CBC:  Lab 03/04/12 0410 03/03/12 0406 03/02/12 1815  WBC 8.5 10.2 --  NEUTROABS -- -- 8.0*  HGB 10.7* 10.6* --  HCT 32.6* 32.3* --  MCV 91.6 91.8 --  PLT 233 223 --    Studies/Results: No results found.  Medications: Scheduled Meds:    . amitriptyline  225 mg Oral QHS  . cholecalciferol  5,000 Units Oral Daily  . coumadin book   Does not apply Once  . enoxaparin (LOVENOX) injection  90 mg Subcutaneous Q12H  . furosemide  20 mg Intravenous Daily  . hydrOXYzine  25 mg Oral TID    . levofloxacin (LEVAQUIN) IV  500 mg Intravenous Q24H  . potassium chloride  40 mEq Oral BID  . vancomycin  750 mg Intravenous Q12H  . warfarin  5 mg Oral ONCE-1800  . warfarin  7.5 mg Oral ONCE-1800  . warfarin   Does not apply Once  . Warfarin - Pharmacist Dosing Inpatient   Does not apply q1800   Continuous Infusions:    Assessment/Plan: Principal Problem:   *Cellulitis of lower extremities, intertriginous areas: -Continue IV vancomycin and Levaquin - Wound care recommendations appreciated  Active Problems:  DVT of lower extremity, bilateral - Placed on therapeutic Lovenox and Coumadin per pharmacy, INR 2.1  Elevated alkaline phosphatase: -  GGT elevated, 5'NT pending,  Abd [place] ordered  Moderate malnutrition with albumin of 2.5: - Obtain nutrition consult  COPD: Stable for now  Generalized debility: - obtain PT and OT eval, may need higher level of care, lives alone, appears to be somewhat resistant to skilled nursing facility  DVT Prophylaxis: On therapeutic Lovenox and Coumadin  Code Status:  Disposition: Not medically ready   LOS: 2 days   Cami Delawder M.D. [facility] 03/04/2012, 11:34 AM Pager: [phone]

## 2012-03-04 NOTE — Progress Notes (Signed)
INITIAL ADULT NUTRITION ASSESSMENT Date: 03/04/2012   Time: 1:39 PM Reason for Assessment: Consult  ASSESSMENT: Female 78 y.o.  Dx: Cellulitis  Food/Nutrition Related Hx: Pt reports 10 pound unintended weight gain in the past 2-3 weeks from cellulitis. Pt reports her intake has been poor since she started taking antibiotics because it makes her not as hungry. Pt reports typical intake includes mostly ice cream, and some oatmeal, potato, and yogurt. Pt reports she has to have soft foods otherwise she will choke. Pt stated she has to monitor her swallowing and eat slowly and states she has had this swallowing problem for years. Pt has dentures, however lost her lower set and thinks her top set needs to be fixed. Pt reports not wanting anything for breakfast because she is not hungry. Pt reports she has been on Ensure in the past and would like some - will order.   Hx:  Past Medical History  Diagnosis Date  . DVT (deep venous thrombosis)   . Hypertension   . COPD (chronic obstructive pulmonary disease)   . GERD (gastroesophageal reflux disease)   . Osteoarthritis   . Ischemic colitis   . Depression   . Gastric erosion    Related Meds:  Scheduled Meds:   . amitriptyline  225 mg Oral QHS  . cholecalciferol  5,000 Units Oral Daily  . coumadin book   Does not apply Once  . enoxaparin (LOVENOX) injection  90 mg Subcutaneous Q12H  . furosemide  20 mg Intravenous Daily  . hydrOXYzine  25 mg Oral TID  . levofloxacin (LEVAQUIN) IV  500 mg Intravenous Q24H  . potassium chloride  40 mEq Oral BID  . vancomycin  750 mg Intravenous Q12H  . warfarin  5 mg Oral ONCE-1800  . warfarin  7.5 mg Oral ONCE-1800  . warfarin   Does not apply Once  . Warfarin - Pharmacist Dosing Inpatient   Does not apply q1800   Continuous Infusions:  PRN Meds:.acetaminophen, acetaminophen, rOPINIRole, traMADol  Ht: 4\' 11"  (149.9 cm)  Wt: 210 lb 11.2 oz (95.573 kg) with non-pitting LLE edema  Ideal Wt: 98 lb %  Ideal Wt: 214  Usual Wt: 200 lb % Usual Wt: 105  Body mass index is 42.56 kg/(m^2). Extremity Obesity   Labs:  CMP     Component Value Date/Time   NA 138 03/04/2012 0410   K 3.9 03/04/2012 0410   CL 100 03/04/2012 0410   CO2 29 03/04/2012 0410   GLUCOSE 112* 03/04/2012 0410   BUN 9 03/04/2012 0410   CREATININE 0.78 03/04/2012 0410   CALCIUM 8.7 03/04/2012 0410   PROT 6.2 03/03/2012 0406   ALBUMIN 2.5* 03/03/2012 0406   AST 36 03/03/2012 0406   ALT 52* 03/03/2012 0406   ALKPHOS 280* 03/03/2012 0406   BILITOT 0.8 03/03/2012 0406   GFRNONAA 75* 03/04/2012 0410   GFRAA 87* 03/04/2012 0410    Intake/Output Summary (Last 24 hours) at 03/04/12 1353 Last data filed at 03/04/12 1610  Gross per 24 hour  Intake    480 ml  Output      0 ml  Net    480 ml   Last BM - 03/02/12  Diet Order: General   IVF:    Estimated Nutritional Needs:   Kcal:1450-1750 Protein:45-55g Fluid:1.4-1.7L  NUTRITION DIAGNOSIS: -Inadequate oral intake (NI-2.1).  Status: Ongoing -Pt meets criteria for moderate PCM of chronic illness AEB <75% energy intake for the past month in addition to recent 10 pound reported fluid  weight gain in the past 2-3 weeks   RELATED TO: poor appetite  AS EVIDENCE BY: <25% meal intake  MONITORING/EVALUATION(Goals): Pt to consume >75% of meals/supplements  EDUCATION NEEDS: -No education needs identified at this time  INTERVENTION: Ensure Complete BID. Encouraged increased intake. Recommend SLP evaluation for pt's reported dysphagia. Will monitor.   Dietitian #: [phone]  DOCUMENTATION CODES Per approved criteria  -Non-severe (moderate) malnutrition in the context of chronic illness -Morbid Obesity    Marshall Cork 03/04/2012, 1:39 PM

## 2012-03-04 NOTE — Evaluation (Signed)
Physical Therapy Evaluation Patient Details Name: [name] Walker MRN: [id number] DOB: Mar 23, 1925 Today's Date: 03/04/2012 Time: 4098-1191 PT Time Calculation (min): 23 min  PT Assessment / Plan / Recommendation Clinical Impression  pt admitted w/ worsening of Bil. LE cellulitis, Bil DVT's. pt reports Independent PTA and wishes to return home. pt will benefit from PT to improve strength and functional mobility to DC to home. rec. HHPT    PT Assessment  Patient needs continued PT services    Follow Up Recommendations  Home health PT;Supervision/Assistance - 24 hour    Barriers to Discharge        lEquipment Recommendations  None recommended by PT    Recommendations for Other Services OT consult   Frequency Min 3X/week    Precautions / Restrictions     Pertinent Vitals/Pain Tissue around skin folds tender.      Mobility  Bed Mobility Bed Mobility: Supine to Sit;Sit to Supine Supine to Sit: 3: Mod assist;HOB elevated;With rails Sit to Supine: 3: Mod assist;HOB flat Details for Bed Mobility Assistance: takes extra time to position self to move to edge, pt does not sleep ina bed. Transfers Transfers: Sit to Stand;Stand to Sit Sit to Stand: 1: +2 Total assist Sit to Stand: Patient Percentage: 80% Stand to Sit: 4: Min assist;To bed Details for Transfer Assistance: Pt is able to stand and remain standing for pericare  with min assistance. pt declined to get to recliner due to lasix Ambulation/Gait Ambulation/Gait Assistance: 1: +2 Total assist Ambulation Distance (Feet): 3 Feet (side steps toward [facility]) Assistive device: Rolling walker Gait Pattern: Step-to pattern    Exercises     PT Diagnosis: Difficulty walking  PT Problem List: Decreased strength;Decreased activity tolerance;Decreased mobility;Decreased knowledge of use of DME;Decreased skin integrity PT Treatment Interventions: DME instruction;Gait training;Functional mobility training;Therapeutic  activities;Patient/family education   PT Goals Acute Rehab PT Goals PT Goal Formulation: With patient Time For Goal Achievement: 03/18/12 Potential to Achieve Goals: Good Pt will go Sit to Stand: with supervision;from elevated surface PT Goal: Sit to Stand - Progress: Goal set today Pt will go Stand to Sit: without upper extremity assist;with supervision PT Goal: Stand to Sit - Progress: Goal set today Pt will Transfer Bed to Chair/Chair to Bed: with supervision PT Transfer Goal: Bed to Chair/Chair to Bed - Progress: Goal set today Pt will Ambulate: 16 - 50 feet;with supervision;with rolling walker PT Goal: Ambulate - Progress: Goal set today  Visit Information  Last PT Received On: 03/04/12 Assistance Needed: +2    Subjective Data  Subjective: oh I can't get up. they gave me lasix. they said you were coming Patient Stated Goal: to return home   Prior Functioning  Home Living Lives With: Alone Available Help at Discharge: Family;Available PRN/intermittently Type of Home: House Home Access: Ramped entrance Home Layout: One level Bathroom Shower/Tub: Walk-in shower Home Adaptive Equipment: Environmental consultant - four wheeled;Walker - rolling;Bedside commode/3-in-1;Electric Scooter (2 lift chairs- sleeps in recliner) Additional Comments: pt reports she is Independent. Prior Function Level of Independence: Independent Driving: Yes Communication Communication: No difficulties (speech is a little difficult w/ absence of dentures)    Cognition  Overall Cognitive Status: Appears within functional limits for tasks assessed/performed Arousal/Alertness: Awake/alert Orientation Level: Appears intact for tasks assessed Behavior During Session: [facility] for tasks performed    Extremity/Trunk Assessment Right Upper Extremity Assessment RUE ROM/Strength/Tone: [facility] for tasks assessed Left Upper Extremity Assessment LUE ROM/Strength/Tone: [facility] for tasks assessed Right Lower Extremity Assessment RLE  ROM/Strength/Tone: [facility] for  tasks assessed Left Lower Extremity Assessment LLE ROM/Strength/Tone: [facility] for tasks assessed Trunk Assessment Trunk Assessment: Normal   Balance Balance Balance Assessed:  (sits at EOB w no problems)  End of Session PT - End of Session Activity Tolerance: Patient tolerated treatment well Patient left: in bed;with call bell/phone within reach Nurse Communication: Mobility status   Rada Hay 03/04/2012, 3:35 PM  [phone]

## 2012-03-04 NOTE — Progress Notes (Signed)
ANTIBIOTIC CONSULT NOTE - Follow Up  Pharmacy Consult for Vancomycin Indication: Cellulitis  Allergies  Allergen Reactions  . Iohexol      Desc: THROAT SWELLING-NOTED IN CHART AFTER IVC PLACEMENT-ARS 12-02-05   . Latex   . Penicillins    Patient Measurements: Height: 4\' 11"  (149.9 cm) Weight: 210 lb 11.2 oz (95.573 kg) IBW/kg (Calculated) : 43.2   Vital Signs: Temp: 98.6 F (37 C) (06/11 0550) Temp src: Oral (06/11 0550) BP: 106/61 mmHg (06/11 0550) Pulse Rate: 84  (06/11 0550) Intake/Output from previous day: 06/10 0701 - 06/11 0700 In: 520 [P.O.:520] Out: -  Intake/Output from this shift: Total I/O In: 120 [P.O.:120] Out: -   Labs:  Basename 03/04/12 0410 03/03/12 0406 03/02/12 1815  WBC 8.5 10.2 11.4*  HGB 10.7* 10.6* 10.9*  PLT 233 223 205  LABCREA -- -- --  CREATININE 0.78 0.92 0.96   Estimated Creatinine Clearance: 53.1 ml/min (by C-G formula based on Cr of 0.78).  Basename 03/04/12 0857  VANCOTROUGH 12.2  VANCOPEAK --  VANCORANDOM --  GENTTROUGH --  GENTPEAK --  GENTRANDOM --  TOBRATROUGH --  TOBRAPEAK --  TOBRARND --  AMIKACINPEAK --  AMIKACINTROU --  AMIKACIN --     Microbiology: No results found for this or any previous visit (from the past 720 hour(s)).  Medical History: Past Medical History  Diagnosis Date  . DVT (deep venous thrombosis)   . Hypertension   . COPD (chronic obstructive pulmonary disease)   . GERD (gastroesophageal reflux disease)   . Osteoarthritis   . Ischemic colitis   . Depression   . Gastric erosion    Medications:  Prescriptions prior to admission  Medication Sig Dispense Refill  . amitriptyline (ELAVIL) 75 MG tablet Take 225 mg by mouth at bedtime.      . Cholecalciferol (VITAMIN D) 1000 UNITS capsule Take 5,000 Units by mouth daily.      Marland Kitchen gabapentin (NEURONTIN) 300 MG capsule Take 300 mg by mouth 2 (two) times daily.      . hydrOXYzine (ATARAX/VISTARIL) 25 MG tablet Take 25 mg by mouth 3 (three) times  daily.      Marland Kitchen levofloxacin (LEVAQUIN) 500 MG tablet Take 500 mg by mouth daily. For 7 days.      . potassium chloride SA (K-DUR,KLOR-CON) 20 MEQ tablet Take 20 mEq by mouth 2 (two) times daily.      . traMADol (ULTRAM) 50 MG tablet Take 50 mg by mouth 2 (two) times daily as needed. Pain.      . warfarin (COUMADIN) 5 MG tablet Take 2.5-5 mg by mouth See admin instructions. Take 2.5mg  every other day. Take 5mg  on alternate days.      Marland Kitchen rOPINIRole (REQUIP) 1 MG tablet Take 1-2 mg by mouth See admin instructions. Take 1 to 2 tablets by mouth at bedtime for restless legs.       Anti-infectives     Start     Dose/Rate Route Frequency Ordered Stop   03/02/12 2200   vancomycin (VANCOCIN) 750 mg in sodium chloride 0.9 % 150 mL IVPB        750 mg 150 mL/hr over 60 Minutes Intravenous Every 12 hours 03/02/12 2103     03/02/12 2100   vancomycin (VANCOCIN) IVPB 1000 mg/200 mL premix  Status:  Discontinued        1,000 mg 200 mL/hr over 60 Minutes Intravenous Every 12 hours 03/02/12 2049 03/02/12 2054   03/02/12 2100   levofloxacin (LEVAQUIN) IVPB  500 mg        500 mg 100 mL/hr over 60 Minutes Intravenous Every 24 hours 03/02/12 2049     03/02/12 1930   cefTRIAXone (ROCEPHIN) 1 g in dextrose 5 % 50 mL IVPB        1 g 100 mL/hr over 30 Minutes Intravenous  Once 03/02/12 1922 03/02/12 2012         Assessment:  78yo F with L leg swelling, pain, possible cellulitis. On Levaquin PTA.  D#3 Vancomycin 750mg  IV q12h, Levaquin 500mg  IV q24h.  Afebrile, WBC improved.  Vancomycin trough therapeutic.  Serum creatinine stable.  Goal of Therapy:  Vancomycin trough level 10-15 mcg/ml  Plan:   Continue Vancomycin 750mg  IV q12h   Continue Levaquin as ordered by MD Follow serum creatinine  Jeanett Antonopoulos, Ky Barban, PharmD, BCPS Pager: [phone] 03/04/2012,10:10 AM.

## 2012-03-04 NOTE — Progress Notes (Signed)
Clinical Social Work Department BRIEF PSYCHOSOCIAL ASSESSMENT 03/04/2012  Patient:  [name] Walker, [name] Walker     Account Number:  [id number]     Admit date:  03/02/2012  Clinical Social Worker:  Candie Chroman  Date/Time:  03/04/2012 04:28 PM  Referred by:  RN  Date Referred:  03/04/2012 Referred for  SNF Placement   Other Referral:   Interview type:  Patient Other interview type:    PSYCHOSOCIAL DATA Living Status:  ALONE Admitted from facility:   Level of care:   Primary support name:  [name] Walker Primary support relationship to patient:  Adult Child Degree of support available:  limited  CURRENT CONCERNS Current Concerns  Post-Acute Placement   Other Concerns:    SOCIAL WORK ASSESSMENT / PLAN Pt is an 78 yrold female living alone prior to hospitalization. Met with pt to assist with d/c planning. PT recommends 24/ 7 supervision at home. Pt stateshe has some support but not 24/7. She is also declining SNF placement at this time.    Assessment/plan status:  No Further Intervention Required Other assessment/ plan:   Information/referral to community resources:   Pt declined SNF list.    PATIENT'S/FAMILY'S RESPONSE TO PLAN OF CARE: Pt plans to return home with [facility]. She has declined SNF placement. CSW is available to assist with d/c planning if pt changes her mind and will reconsider placement.    Cori Razor LCSW [phone]

## 2012-03-04 NOTE — Progress Notes (Signed)
ANTICOAGULATION CONSULT NOTE - Follow-Up Consult  Pharmacy Consult for Lovenox, Coumadin Indication: DVT  Allergies  Allergen Reactions  . Iohexol      Desc: THROAT SWELLING-NOTED IN CHART AFTER IVC PLACEMENT-ARS 12-02-05   . Latex   . Penicillins    Patient Measurements: Height: 4\' 11"  (149.9 cm) Weight: 210 lb 11.2 oz (95.573 kg) IBW/kg (Calculated) : 43.2   Vital Signs: Temp: 98.6 F (37 C) (06/11 0550) Temp src: Oral (06/11 0550) BP: 106/61 mmHg (06/11 0550) Pulse Rate: 84  (06/11 0550)  Labs:  Basename 03/04/12 0410 03/03/12 0406 03/02/12 1815  HGB 10.7* 10.6* --  HCT 32.6* 32.3* 32.1*  PLT 233 223 205  APTT -- -- --  LABPROT 24.2* 18.9* 18.6*  INR 2.13* 1.55* 1.52*  HEPARINUNFRC -- -- --  CREATININE 0.78 0.92 0.96  CKTOTAL -- -- --  CKMB -- -- --  TROPONINI -- -- --    Estimated Creatinine Clearance: 53.1 ml/min (by C-G formula based on Cr of 0.78).  Medical History: Past Medical History  Diagnosis Date  . DVT (deep venous thrombosis)   . Hypertension   . COPD (chronic obstructive pulmonary disease)   . GERD (gastroesophageal reflux disease)   . Osteoarthritis   . Ischemic colitis   . Depression   . Gastric erosion    Medications:  Prescriptions prior to admission  Medication Sig Dispense Refill  . amitriptyline (ELAVIL) 75 MG tablet Take 225 mg by mouth at bedtime.      . Cholecalciferol (VITAMIN D) 1000 UNITS capsule Take 5,000 Units by mouth daily.      Marland Kitchen gabapentin (NEURONTIN) 300 MG capsule Take 300 mg by mouth 2 (two) times daily.      . hydrOXYzine (ATARAX/VISTARIL) 25 MG tablet Take 25 mg by mouth 3 (three) times daily.      Marland Kitchen levofloxacin (LEVAQUIN) 500 MG tablet Take 500 mg by mouth daily. For 7 days.      . potassium chloride SA (K-DUR,KLOR-CON) 20 MEQ tablet Take 20 mEq by mouth 2 (two) times daily.      . traMADol (ULTRAM) 50 MG tablet Take 50 mg by mouth 2 (two) times daily as needed. Pain.      . warfarin (COUMADIN) 5 MG tablet Take  2.5-5 mg by mouth See admin instructions. Take 2.5mg  every other day. Take 5mg  on alternate days.      Marland Kitchen rOPINIRole (REQUIP) 1 MG tablet Take 1-2 mg by mouth See admin instructions. Take 1 to 2 tablets by mouth at bedtime for restless legs.          Assessment:  78yo F on chronic warfarin for DVT, admitted with worsening swelling and pain of the left leg.Venous duplex 6/10 positive for bilateral LE DVT   Now beginning D#3/5 of full-dose Lovenox (90mg  SQ q12h).  Warfarin regimen PTA reported as 2.5mg  and 5mg  on alternating days. INR was subtherapeutic on admission.   Warfarin booster doses of 7.5mg  were given on 6/9, 6/10.  INR is therapeutic today.  Concomitant levofloxacin noted - can increase INR response to warfarin.  Goal of Therapy:  INR 2-3 Monitor platelets by anticoagulation protocol: Yes   Plan:   Warfarin 5mg  PO today x 1.  Continue full-dose Lovenox to complete 5 days of therapy.  Review warfarin teaching points with patient  Hope Budds, PharmD, BCPS Pager: [phone] 03/04/2012  10:22 AM

## 2012-03-05 DIAGNOSIS — L02619 Cutaneous abscess of unspecified foot: Secondary | ICD-10-CM

## 2012-03-05 DIAGNOSIS — I749 Embolism and thrombosis of unspecified artery: Secondary | ICD-10-CM

## 2012-03-05 DIAGNOSIS — L03039 Cellulitis of unspecified toe: Secondary | ICD-10-CM

## 2012-03-05 DIAGNOSIS — M7989 Other specified soft tissue disorders: Secondary | ICD-10-CM

## 2012-03-05 LAB — CBC
HCT: 32.1 % — ABNORMAL LOW (ref 36.0–46.0)
MCHC: 32.7 g/dL (ref 30.0–36.0)
MCV: 91.5 fL (ref 78.0–100.0)
Platelets: 252 10*3/uL (ref 150–400)
RDW: 15.8 % — ABNORMAL HIGH (ref 11.5–15.5)

## 2012-03-05 LAB — BASIC METABOLIC PANEL
BUN: 7 mg/dL (ref 6–23)
Creatinine, Ser: 0.82 mg/dL (ref 0.50–1.10)
GFR calc Af Amer: 74 mL/min — ABNORMAL LOW (ref >90)
GFR calc non Af Amer: 64 mL/min — ABNORMAL LOW (ref >90)
Potassium: 4.2 mEq/L (ref 3.5–5.1)

## 2012-03-05 LAB — PROTIME-INR: INR: 2.71 — ABNORMAL HIGH (ref 0.00–1.49)

## 2012-03-05 MED ORDER — POTASSIUM CHLORIDE CRYS ER 20 MEQ PO TBCR
40.0000 meq | EXTENDED_RELEASE_TABLET | Freq: Every day | ORAL | Status: DC
  Administered 2012-03-06: 40 meq via ORAL
  Filled 2012-03-05: qty 2

## 2012-03-05 MED ORDER — WARFARIN SODIUM 2.5 MG PO TABS
2.5000 mg | ORAL_TABLET | Freq: Once | ORAL | Status: AC
  Administered 2012-03-05: 2.5 mg via ORAL
  Filled 2012-03-05: qty 1

## 2012-03-05 MED ORDER — LEVOFLOXACIN 500 MG PO TABS
500.0000 mg | ORAL_TABLET | Freq: Every day | ORAL | Status: DC
  Administered 2012-03-06: 500 mg via ORAL
  Filled 2012-03-05: qty 1

## 2012-03-05 NOTE — Care Management Note (Signed)
    Page 1 of 2   03/05/2012     2:15:09 PM   CARE MANAGEMENT NOTE 03/05/2012  Patient:  [name] Walker, [name] Walker   Account Number:  [id number]  Date Initiated:  03/05/2012  Documentation initiated by:  Lorenda Ishihara  Subjective/Objective Assessment:   78 yo female admitted with LE cellulitis. PTA lived at home alone. PCP is Dr. Marisue Brooklyn.     Action/Plan:   Resume HH services arrange previously, continue wound care at [facility] wound clinic   Anticipated DC Date:  03/07/2012   Anticipated DC Plan:  HOME W HOME HEALTH SERVICES      DC Planning Services  CM consult      [facility] Choice  HOME HEALTH  Resumption Of Svcs/PTA Provider   Choice offered to / List presented to:  C-1 Patient        HH arranged  HH-2 PT  HH-1 RN  HH-10 DISEASE MANAGEMENT      HH agency  [facility]   Status of service:  In process, will continue to follow Medicare Important Message given?   (If response is "NO", the following Medicare IM given date fields will be blank) Date Medicare IM given:   Date Additional Medicare IM given:    Discharge Disposition:    Per UR Regulation:  Reviewed for med. necessity/level of care/duration of stay  If discussed at Long Length of Stay Meetings, dates discussed:    Comments:  03-05-12 Lorenda Ishihara RN CM 1000 Spoke with patient at bedside. Is adamant that she will be safe at home. She feels she will be able to manage with equipment she has in place, walker, lift chair, etc. She states she lives next door to her family and they assist with transportation when needed. She also states she was active with [facility] pta and being seen by Dr Tanda Rockers at [facility] weekly. Contacted [facility] for resumption of care at d/c.

## 2012-03-05 NOTE — Progress Notes (Addendum)
Patient ID: [name] Walker  female  NWG:[id number]    DOB: 1924-12-26    DOA: 03/02/2012  PCP: Elisabeth Most, AMY, DO  Subjective: Pt states leg swelling, pain and redness less. She is adamant that she does not want to go to snf. She is alert and lucid and states that she has all the equipment she needs at home   Objective: Weight change:   Intake/Output Summary (Last 24 hours) at 03/05/12 1924 Last data filed at 03/05/12 1800  Gross per 24 hour  Intake   1200 ml  Output    100 ml  Net   1100 ml   Blood pressure 113/43, pulse 77, temperature 97.5 F (36.4 C), temperature source Oral, resp. rate 18, height 4\' 11"  (1.499 m), weight 95.6 kg (210 lb 12.2 oz), SpO2 94.00%.  Physical Exam: General: Alert and awake, oriented x3, not in any acute distress. HEENT: anicteric sclera, pupils reactive to light and accommodation, EOMI CVS: S1-S2 clear, no murmur rubs or gallops Chest: clear to auscultation bilaterally, no wheezing, rales or rhonchi Abdomen: Obese, soft nontender, nondistended, normal bowel sounds, no organomegaly Extremities: Erythema bilaterally with chronic venous status changes, mild tenderness present. Skin: Intertriginous erythema and moist with skin damage beneath the breasts and abdominal skin folds, stage I sacral decub  Lab Results: Basic Metabolic Panel:  Lab 03/05/12 5784 03/04/12 0410  NA 135 138  K 4.2 3.9  CL 99 100  CO2 29 29  GLUCOSE 97 112*  BUN 7 9  CREATININE 0.82 0.78  CALCIUM 8.8 8.7  MG -- --  PHOS -- --   Liver Function Tests:  Lab 03/03/12 0406  AST 36  ALT 52*  ALKPHOS 280*  BILITOT 0.8  PROT 6.2  ALBUMIN 2.5*  CBC:  Lab 03/05/12 0435 03/04/12 0410 03/02/12 1815  WBC 8.3 8.5 --  NEUTROABS -- -- 8.0*  HGB 10.5* 10.7* --  HCT 32.1* 32.6* --  MCV 91.5 91.6 --  PLT 252 233 --    Studies/Results: No results found.  Medications: Scheduled Meds:    . amitriptyline  225 mg Oral QHS  . cholecalciferol  5,000 Units Oral Daily  .  enoxaparin (LOVENOX) injection  90 mg Subcutaneous Q12H  . feeding supplement  237 mL Oral BID BM  . furosemide  20 mg Intravenous Daily  . hydrOXYzine  25 mg Oral TID  . levofloxacin (LEVAQUIN) IV  500 mg Intravenous Q24H  . potassium chloride  40 mEq Oral BID  . vancomycin  750 mg Intravenous Q12H  . warfarin  2.5 mg Oral ONCE-1800  . warfarin  5 mg Oral ONCE-1800  . Warfarin - Pharmacist Dosing Inpatient   Does not apply q1800   Continuous Infusions:    Assessment/Plan: Principal Problem:   *Cellulitis of lower extremities, intertriginous areas: -No cultures/ no evidence of resistant organisms, will d/c vanc and monitor for fevers, change levaquin to PO - Wound care recommendations appreciated  Active Problems:  DVT of lower extremity, bilateral - Placed on therapeutic Lovenox and Coumadin per pharmacy, INR 2.7 today but today is just day 4/5 of full dose lovenox, will plan to d/c the lovenox when she completes tomorrow's dose    Elevated alkaline phosphatase: -[place] with hepatic steatosis, sp cholecystectomy -  GGT elevated, 5'NT still pending - follow  Moderate malnutrition with albumin of 2.5: - Obtain nutrition consult  COPD: Stable for now  Generalized debility: - PT  Recommends HH PT. She lives alone, and as above she declines  SNF, will resume HH sev\rvices when she is ready for d/c  DVT Prophylaxis: On therapeutic Lovenox and Coumadin  Disposition: if continues to improve plan d/c soon   LOS: 3 days   Kela Millin M.D. [facility] 03/05/2012, 7:24 PM Pager: [phone]

## 2012-03-05 NOTE — Progress Notes (Signed)
ANTICOAGULATION CONSULT NOTE - Follow-Up Consult  Pharmacy Consult for Lovenox, Coumadin Indication: DVT  Allergies  Allergen Reactions  . Iohexol      Desc: THROAT SWELLING-NOTED IN CHART AFTER IVC PLACEMENT-ARS 12-02-05   . Latex   . Penicillins    Patient Measurements: Height: 4\' 11"  (149.9 cm) Weight: 210 lb 12.2 oz (95.6 kg) IBW/kg (Calculated) : 43.2   Vital Signs: Temp: 98.1 F (36.7 C) (06/12 0625) Temp src: Oral (06/12 0625) BP: 125/62 mmHg (06/12 0625) Pulse Rate: 76  (06/12 0625)  Labs:  Basename 03/05/12 0435 03/04/12 0410 03/03/12 0406  HGB 10.5* 10.7* --  HCT 32.1* 32.6* 32.3*  PLT 252 233 223  APTT -- -- --  LABPROT 29.2* 24.2* 18.9*  INR 2.71* 2.13* 1.55*  HEPARINUNFRC -- -- --  CREATININE 0.82 0.78 0.92  CKTOTAL -- -- --  CKMB -- -- --  TROPONINI -- -- --    Estimated Creatinine Clearance: 51.8 ml/min (by C-G formula based on Cr of 0.82).  Medical History: Past Medical History  Diagnosis Date  . DVT (deep venous thrombosis)   . Hypertension   . COPD (chronic obstructive pulmonary disease)   . GERD (gastroesophageal reflux disease)   . Osteoarthritis   . Ischemic colitis   . Depression   . Gastric erosion    Medications:  Prescriptions prior to admission  Medication Sig Dispense Refill  . amitriptyline (ELAVIL) 75 MG tablet Take 225 mg by mouth at bedtime.      . Cholecalciferol (VITAMIN D) 1000 UNITS capsule Take 5,000 Units by mouth daily.      Marland Kitchen gabapentin (NEURONTIN) 300 MG capsule Take 300 mg by mouth 2 (two) times daily.      . hydrOXYzine (ATARAX/VISTARIL) 25 MG tablet Take 25 mg by mouth 3 (three) times daily.      Marland Kitchen levofloxacin (LEVAQUIN) 500 MG tablet Take 500 mg by mouth daily. For 7 days.      . potassium chloride SA (K-DUR,KLOR-CON) 20 MEQ tablet Take 20 mEq by mouth 2 (two) times daily.      . traMADol (ULTRAM) 50 MG tablet Take 50 mg by mouth 2 (two) times daily as needed. Pain.      . warfarin (COUMADIN) 5 MG tablet Take  2.5-5 mg by mouth See admin instructions. Take 2.5mg  every other day. Take 5mg  on alternate days.      Marland Kitchen rOPINIRole (REQUIP) 1 MG tablet Take 1-2 mg by mouth See admin instructions. Take 1 to 2 tablets by mouth at bedtime for restless legs.          Assessment:  78yo F on chronic warfarin for DVT, admitted with worsening swelling and pain of the left leg.Venous duplex 6/10 positive for bilateral LE DVT   Now beginning day #4/5 of full-dose Lovenox (90mg  SQ q12h).  Warfarin regimen PTA reported as 2.5mg  and 5mg  on alternating days. INR was subtherapeutic on admission.   Inpatient warfarin doses since admission (6/9 - 6/11): 7.5, 7.5, 5 mg.  INR therapeutic today.  Concomitant levofloxacin noted - can increase INR response to warfarin.  Goal of Therapy:  INR 2-3 Monitor platelets by anticoagulation protocol: Yes   Plan:   Warfarin 2.5 mg PO today x 1.  Continue full-dose Lovenox to complete 5 days of therapy.  Hope Budds, PharmD, BCPS Pager: [phone] 03/05/2012  7:15 AM

## 2012-03-06 DIAGNOSIS — M7989 Other specified soft tissue disorders: Secondary | ICD-10-CM

## 2012-03-06 DIAGNOSIS — L03039 Cellulitis of unspecified toe: Secondary | ICD-10-CM

## 2012-03-06 DIAGNOSIS — I749 Embolism and thrombosis of unspecified artery: Secondary | ICD-10-CM

## 2012-03-06 DIAGNOSIS — L02619 Cutaneous abscess of unspecified foot: Secondary | ICD-10-CM

## 2012-03-06 LAB — CBC
MCH: 29.8 pg (ref 26.0–34.0)
MCV: 90.3 fL (ref 78.0–100.0)
Platelets: 265 10*3/uL (ref 150–400)
RDW: 15.6 % — ABNORMAL HIGH (ref 11.5–15.5)

## 2012-03-06 LAB — PROTIME-INR: Prothrombin Time: 30.3 seconds — ABNORMAL HIGH (ref 11.6–15.2)

## 2012-03-06 LAB — BASIC METABOLIC PANEL
Calcium: 9.1 mg/dL (ref 8.4–10.5)
Creatinine, Ser: 0.8 mg/dL (ref 0.50–1.10)
GFR calc Af Amer: 76 mL/min — ABNORMAL LOW (ref >90)
GFR calc non Af Amer: 66 mL/min — ABNORMAL LOW (ref >90)

## 2012-03-06 MED ORDER — FUROSEMIDE 20 MG PO TABS
20.0000 mg | ORAL_TABLET | Freq: Every day | ORAL | Status: DC
  Administered 2012-03-06: 20 mg via ORAL
  Filled 2012-03-06: qty 1

## 2012-03-06 MED ORDER — POTASSIUM CHLORIDE CRYS ER 20 MEQ PO TBCR: 20.0000 meq | EXTENDED_RELEASE_TABLET | Freq: Every day | ORAL | Status: DC

## 2012-03-06 MED ORDER — FUROSEMIDE 20 MG PO TABS: 20.0000 mg | ORAL_TABLET | ORAL | Status: DC

## 2012-03-06 MED ORDER — WARFARIN SODIUM 2.5 MG PO TABS
2.5000 mg | ORAL_TABLET | Freq: Once | ORAL | Status: DC
  Filled 2012-03-06: qty 1

## 2012-03-06 MED ORDER — LEVOFLOXACIN 500 MG PO TABS: 500.0000 mg | ORAL_TABLET | Freq: Every day | ORAL | Status: DC

## 2012-03-06 MED ORDER — ENOXAPARIN SODIUM 40 MG/0.4ML ~~LOC~~ SOLN
40.0000 mg | Freq: Once | SUBCUTANEOUS | Status: DC
  Filled 2012-03-06: qty 0.4

## 2012-03-06 NOTE — Progress Notes (Signed)
PT Cancellation Note  Treatment cancelled today due to patient's refusal to participate.  Pt states she may be going home today and is unable to have PT at this time.  Donnetta Hail 03/06/2012, 2:42 PM

## 2012-03-06 NOTE — Discharge Summary (Signed)
Discharge Note  Name: [name] Walker MRN: [id number] DOB: 1925/04/20 78 y.o.  Date of Admission: 03/02/2012  5:00 PM Date of Discharge: 03/06/2012 Attending Physician: Kela Millin, MD  Discharge Diagnosis: Principal Problem:  *Cellulitis Active Problems:  DVT of lower extremity, bilateral  COPD Elevated alkaline phosphatase  Pending labs 5 'NT level Discharge Medications: Medication List  As of 03/06/2012  2:25 PM   TAKE these medications         amitriptyline 75 MG tablet   Commonly known as: ELAVIL   Take 225 mg by mouth at bedtime.      furosemide 20 MG tablet   Commonly known as: LASIX   Take 1 tablet (20 mg total) by mouth every other day.      gabapentin 300 MG capsule   Commonly known as: NEURONTIN   Take 300 mg by mouth 2 (two) times daily.      hydrOXYzine 25 MG tablet   Commonly known as: ATARAX/VISTARIL   Take 25 mg by mouth 3 (three) times daily.      levofloxacin 500 MG tablet   Commonly known as: LEVAQUIN   Take 1 tablet (500 mg total) by mouth daily. For 7 days.      potassium chloride SA 20 MEQ tablet   Commonly known as: K-DUR,KLOR-CON   Take 1 tablet (20 mEq total) by mouth daily.      rOPINIRole 1 MG tablet   Commonly known as: REQUIP   Take 1-2 mg by mouth See admin instructions. Take 1 to 2 tablets by mouth at bedtime for restless legs.      traMADol 50 MG tablet   Commonly known as: ULTRAM   Take 50 mg by mouth 2 (two) times daily as needed. Pain.      Vitamin D 1000 UNITS capsule   Take 5,000 Units by mouth daily.      warfarin 5 MG tablet   Commonly known as: COUMADIN   Take 2.5-5 mg by mouth See admin instructions. Take 2.5mg  every other day. Take 5mg  on alternate days.            Disposition and follow-up:   Ms.Meilah W Ditommaso was discharged from [facility] in improved/stable condition.    Follow-up Appointments: Discharge Orders    Future Orders Please Complete By Expires   Diet - low sodium  heart healthy      Increase activity slowly         Consultations:    Procedures Performed:  US Abdomen Complete  03/04/2012  *RADIOLOGY REPORT*  Clinical Data:  Elevated alkaline phosphatase.  COMPLETE ABDOMINAL ULTRASOUND  Comparison:  No priors.  Findings:  Gallbladder:  Status post cholecystectomy.  Common bile duct:  Measures 5.1 mm in diameter within the porta hepatis.  Liver:  Diffusely echogenic, suggestive of hepatic steatosis. Normal size without focal parenchymal abnormality.  Patent portal vein with hepatopetal flow.  IVC:  Patent throughout its visualized course in the abdomen.  Pancreas:  Cannot be visualized secondary to overlying bowel gas.  Spleen:  Poorly visualized secondary to bowel gas.  Measures approximately 7.5 cm in length.  Right Kidney:  No hydronephrosis.  Well-preserved cortex.  Normal size and parenchymal echotexture without focal abnormalities. 10.5 cm in length.  Left Kidney:  No hydronephrosis.  Well-preserved cortex.  Normal size and parenchymal echotexture without focal abnormalities. 11.2 cm in length.  Abdominal aorta:  Cannot be visualized secondary to overlying bowel gas.  IMPRESSION: 1.  Examination  was severely limited by patient's body habitus and large amount of bowel gas.  Several structures, including the pancreas, could not be adequately visualized.  No extrahepatic biliary ductal dilatation noted. 2.  Status post cholecystectomy. 3.  Hepatic steatosis.  Original Report Authenticated By: Florencia Reasons, M.D.     Summary:  - Findings consistent with non obstructive acute deep vein thrombosis involving the left common femoral vein with superficial thrombus in the left greater saphenous vein. - Findings consistent with chronic deep vein thrombosis involving the right distal femoral vein. - No evidence of Baker's cyst on the right or left. Other specific details can be found in the table(s) above. Prepared and Electronically Authenticated by  Gretta Began, MD     Admission HPI Pt is an 78 yo female with hx of copd, dvt, apparently c/o left leg pain with walking x 3-4 days. Pt has edema. Pt has cellulitis presently on levaquin for several days ?Marland Kitchen Pt is not sure who prescribed it, and can't recall , She is a very poor historian. ER requests that we admit her for cellulitis failing outpatient levaquin, and also edema, and leg pain. Pt denies cp, palp, sob, n/v, diarrhea, brbpr, black stool. She was admitted for further evaluation and management.   Physical Exam:  General: Alert and awake, oriented x3, not in any acute distress.  HEENT: anicteric sclera, pupils reactive to light and accommodation, EOMI  CVS: S1-S2 clear, no murmur rubs or gallops  Chest: clear to auscultation bilaterally, no wheezing, rales or rhonchi  Abdomen: Obese, soft nontender, nondistended, normal bowel sounds, no organomegaly  Extremities: Decreased Erythema bilaterally with chronic venous status changes, decreased tenderness, no warmth.  Skin: Intertriginous erythema decreased and the skin damage beneath her breasts healing well, right groin area still a little moist but much improved.   Hospital Course by problem list: Principal Problem:  *Cellulitis Active Problems:  DVT of lower extremity, bilateral  *Cellulitis of lower extremities, and intertriginous areas:  As discussed above, upon admission the patient was started on empiric antibiotics with vancomycin and IV Levaquin. Wound Care nurse was consulted for local wound care the cellulitis in her intertriginous areas, and she received local wound care while in the hospital. She responded well to the antibiotics and local on care and improved clinically-less erythema less edema and she has remained afebrile and hemodynamically stable her last white cell count 8.0. She was diureses with lasix and the edema in her legs felt to be likely due to a component of venous insufficiency this resolved. She continued to  improve her antibiotics were changed to by mouth and no cultures have been done on admission and there was no evidence of resistant of organisms and so the vancomycin was DC'd and she remained afebrile with no leukocytosis. She'll be discharged on Levaquin to complete the treatment course and is to followup with her primary care physician. Active Problems:  DVT of lower extremity, bilateral  The patient had Doppler ultrasounds of her lower extremities and was positive for bilateral DVTs. Her INR was subtherapeutic on admission and her Coumadin was continued and she was placed on  bridging Lovenox. Her INR is therapeutic at this time and she is to continue the Coumadin upon discharge. It was noted that she is on Levaquin and so a INR will need to be closely monitored and her Coumadin dosing adjusted as appropriate. Her INR today prior to discharge is 2.84 and home health RN is to check PT/INR in a.m.  and call results to PCP for further dose adjustment as appropriate. Elevated alkaline phosphatase:  -Patient's alkaline phosphatase was noted to be elevated and an ultrasound was done and showed hepatic steatosis, s/p cholecystectomy.GGT was also done on elevated at 271, 5'NT still pending at the time of discharge - patient is to follow for the results with her PCP and further management as appropriate. Moderate malnutrition with albumin of 2.5:  Nutrition was consulted the a.m. patient was placed ensure in the hospital COPD: Stable for now -Remained stable in the hospital and she is to followup with her prior care physician the  Generalized debility:  - PT was consulted and Recommended HH PT. Pt lives alone, it was initially thought that she might benefit from a skilled nursing but she declined, will resume HH services upon discharge.  Discharge Vitals:  BP 136/73  Pulse 83  Temp 97.9 F (36.6 C) (Oral)  Resp 18  Ht 4\' 11"  (1.499 m)  Wt 95.6 kg (210 lb 12.2 oz)  BMI 42.57 kg/m2  SpO2  93%  Discharge Labs:  Results for orders placed during the hospital encounter of 03/02/12 (from the past 24 hour(s))  PROTIME-INR     Status: Abnormal   Collection Time   03/06/12  4:00 AM      Component Value Range   Prothrombin Time 30.3 (*) 11.6 - 15.2 seconds   INR 2.84 (*) 0.00 - 1.49  CBC     Status: Abnormal   Collection Time   03/06/12  4:00 AM      Component Value Range   WBC 8.0  4.0 - 10.5 K/uL   RBC 3.72 (*) 3.87 - 5.11 MIL/uL   Hemoglobin 11.1 (*) 12.0 - 15.0 g/dL   HCT 40.9 (*) 81.1 - 91.4 %   MCV 90.3  78.0 - 100.0 fL   MCH 29.8  26.0 - 34.0 pg   MCHC 33.0  30.0 - 36.0 g/dL   RDW 78.2 (*) 95.6 - 21.3 %   Platelets 265  150 - 400 K/uL  BASIC METABOLIC PANEL     Status: Abnormal   Collection Time   03/06/12  4:00 AM      Component Value Range   Sodium 134 (*) 135 - 145 mEq/L   Potassium 4.4  3.5 - 5.1 mEq/L   Chloride 97  96 - 112 mEq/L   CO2 29  19 - 32 mEq/L   Glucose, Bld 103 (*) 70 - 99 mg/dL   BUN 7  6 - 23 mg/dL   Creatinine, Ser 0.86  0.50 - 1.10 mg/dL   Calcium 9.1  8.4 - 57.8 mg/dL   GFR calc non Af Amer 66 (*) >90 mL/min   GFR calc Af Amer 76 (*) >90 mL/min   Time spent coordinating discharge:72mins Signed: Kela Millin 03/06/2012, 2:25 PM

## 2012-03-06 NOTE — Progress Notes (Signed)
Pharmacy Brief Note - Lovenox  Dr. Suanne Marker called and asked for our help with adjusting the timing of the final Lovenox dose to allow discharging patient at a reasonable hour today rather than waiting until 9pm.  After discussion we decided to change the final Lovenox dose from 90mg  to 40mg  but administer at 4pm.    This approach is similar to using a 1.5mg /kg SQ single dose, which is an FDA-approved dosage option for DVT treatment.  Elie Goody, PharmD, BCPS Pager: [phone] 03/06/2012  2:13 PM

## 2012-03-06 NOTE — Progress Notes (Signed)
ANTICOAGULATION CONSULT NOTE - Follow-Up Consult  Pharmacy Consult for Lovenox, Coumadin Indication: DVT  Allergies  Allergen Reactions  . Iohexol      Desc: THROAT SWELLING-NOTED IN CHART AFTER IVC PLACEMENT-ARS 12-02-05   . Latex   . Penicillins    Patient Measurements: Height: 4\' 11"  (149.9 cm) Weight: 210 lb 12.2 oz (95.6 kg) IBW/kg (Calculated) : 43.2   Vital Signs: Temp: 97.9 F (36.6 C) (06/13 0611) Temp src: Oral (06/13 0611) BP: 136/73 mmHg (06/13 0611) Pulse Rate: 83  (06/13 0611)  Labs:  Basename 03/06/12 0400 03/05/12 0435 03/04/12 0410  HGB 11.1* 10.5* --  HCT 33.6* 32.1* 32.6*  PLT 265 252 233  APTT -- -- --  LABPROT 30.3* 29.2* 24.2*  INR 2.84* 2.71* 2.13*  HEPARINUNFRC -- -- --  CREATININE 0.80 0.82 0.78  CKTOTAL -- -- --  CKMB -- -- --  TROPONINI -- -- --    Estimated Creatinine Clearance: 53.1 ml/min (by C-G formula based on Cr of 0.8).  Medical History: Past Medical History  Diagnosis Date  . DVT (deep venous thrombosis)   . Hypertension   . COPD (chronic obstructive pulmonary disease)   . GERD (gastroesophageal reflux disease)   . Osteoarthritis   . Ischemic colitis   . Depression   . Gastric erosion    Medications:  Prescriptions prior to admission  Medication Sig Dispense Refill  . amitriptyline (ELAVIL) 75 MG tablet Take 225 mg by mouth at bedtime.      . Cholecalciferol (VITAMIN D) 1000 UNITS capsule Take 5,000 Units by mouth daily.      Marland Kitchen gabapentin (NEURONTIN) 300 MG capsule Take 300 mg by mouth 2 (two) times daily.      . hydrOXYzine (ATARAX/VISTARIL) 25 MG tablet Take 25 mg by mouth 3 (three) times daily.      Marland Kitchen levofloxacin (LEVAQUIN) 500 MG tablet Take 500 mg by mouth daily. For 7 days.      . potassium chloride SA (K-DUR,KLOR-CON) 20 MEQ tablet Take 20 mEq by mouth 2 (two) times daily.      . traMADol (ULTRAM) 50 MG tablet Take 50 mg by mouth 2 (two) times daily as needed. Pain.      . warfarin (COUMADIN) 5 MG tablet Take  2.5-5 mg by mouth See admin instructions. Take 2.5mg  every other day. Take 5mg  on alternate days.      Marland Kitchen rOPINIRole (REQUIP) 1 MG tablet Take 1-2 mg by mouth See admin instructions. Take 1 to 2 tablets by mouth at bedtime for restless legs.       Inpatient warfarin doses (6/9 - 6/12): 7.5, 7.5, 5, 2.5mg    Assessment:  78yo F on chronic warfarin for DVT, admitted with worsening swelling and pain of the left leg.Venous duplex 6/10 positive for bilateral LE DVT.   Day #5/5 of full-dose Lovenox (90mg  SQ q12h).  Warfarin regimen PTA reported as 2.5mg  and 5mg  on alternating days. INR was subtherapeutic on admission. INR now therapeutic.  Concomitant levofloxacin noted - can increase INR response to warfarin.  Goal of Therapy:  INR 2-3 Monitor platelets by anticoagulation protocol: Yes   Plan:   Repeat warfarin 2.5 mg PO today x 1.  Continue full-dose Lovenox to complete 5 days of therapy.  At discharge recommend resuming patient's previous warfarin regimen, but she will need close INR F/U for further dosage adjustments.  Hope Budds, PharmD, BCPS Pager: [phone] 03/06/2012  8:54 AM

## 2012-03-07 LAB — NUCLEOTIDASE, 5', BLOOD: 5-Nucleotidase: 14 U/L — ABNORMAL HIGH (ref <11)

## 2012-05-07 ENCOUNTER — Emergency Department (HOSPITAL_COMMUNITY): Payer: Medicare Other

## 2012-05-07 ENCOUNTER — Encounter (HOSPITAL_COMMUNITY): Payer: Self-pay | Admitting: *Deleted

## 2012-05-07 ENCOUNTER — Emergency Department (HOSPITAL_COMMUNITY)
Admission: EM | Admit: 2012-05-07 | Discharge: 2012-05-07 | Disposition: A | Discharge status: Home / Self Care | Payer: Medicare Other | Attending: Emergency Medicine | Admitting: Emergency Medicine

## 2012-05-07 DIAGNOSIS — Z79899 Other long term (current) drug therapy: Secondary | ICD-10-CM | POA: Insufficient documentation

## 2012-05-07 DIAGNOSIS — F3289 Other specified depressive episodes: Secondary | ICD-10-CM | POA: Insufficient documentation

## 2012-05-07 DIAGNOSIS — Z86718 Personal history of other venous thrombosis and embolism: Secondary | ICD-10-CM | POA: Insufficient documentation

## 2012-05-07 DIAGNOSIS — J449 Chronic obstructive pulmonary disease, unspecified: Secondary | ICD-10-CM | POA: Insufficient documentation

## 2012-05-07 DIAGNOSIS — Z96659 Presence of unspecified artificial knee joint: Secondary | ICD-10-CM | POA: Insufficient documentation

## 2012-05-07 DIAGNOSIS — S8001XA Contusion of right knee, initial encounter: Secondary | ICD-10-CM

## 2012-05-07 DIAGNOSIS — F329 Major depressive disorder, single episode, unspecified: Secondary | ICD-10-CM | POA: Insufficient documentation

## 2012-05-07 DIAGNOSIS — I10 Essential (primary) hypertension: Secondary | ICD-10-CM | POA: Insufficient documentation

## 2012-05-07 DIAGNOSIS — S8002XA Contusion of left knee, initial encounter: Secondary | ICD-10-CM

## 2012-05-07 DIAGNOSIS — S81011A Laceration without foreign body, right knee, initial encounter: Secondary | ICD-10-CM

## 2012-05-07 DIAGNOSIS — W19XXXA Unspecified fall, initial encounter: Secondary | ICD-10-CM | POA: Insufficient documentation

## 2012-05-07 DIAGNOSIS — S81009A Unspecified open wound, unspecified knee, initial encounter: Secondary | ICD-10-CM | POA: Insufficient documentation

## 2012-05-07 DIAGNOSIS — G8929 Other chronic pain: Secondary | ICD-10-CM | POA: Insufficient documentation

## 2012-05-07 DIAGNOSIS — J4489 Other specified chronic obstructive pulmonary disease: Secondary | ICD-10-CM | POA: Insufficient documentation

## 2012-05-07 DIAGNOSIS — Z202 Contact with and (suspected) exposure to infections with a predominantly sexual mode of transmission: Secondary | ICD-10-CM | POA: Insufficient documentation

## 2012-05-07 DIAGNOSIS — S91009A Unspecified open wound, unspecified ankle, initial encounter: Secondary | ICD-10-CM | POA: Insufficient documentation

## 2012-05-07 DIAGNOSIS — Z7901 Long term (current) use of anticoagulants: Secondary | ICD-10-CM | POA: Insufficient documentation

## 2012-05-07 DIAGNOSIS — Z9089 Acquired absence of other organs: Secondary | ICD-10-CM | POA: Insufficient documentation

## 2012-05-07 LAB — CBC WITH DIFFERENTIAL/PLATELET
Basophils Relative: 0 % (ref 0–1)
Eosinophils Absolute: 0.4 10*3/uL (ref 0.0–0.7)
Eosinophils Relative: 5 % (ref 0–5)
Lymphs Abs: 1.4 10*3/uL (ref 0.7–4.0)
MCH: 30.1 pg (ref 26.0–34.0)
MCHC: 33.1 g/dL (ref 30.0–36.0)
MCV: 90.9 fL (ref 78.0–100.0)
Neutrophils Relative %: 66 % (ref 43–77)
Platelets: 271 10*3/uL (ref 150–400)
RDW: 15.5 % (ref 11.5–15.5)

## 2012-05-07 LAB — BASIC METABOLIC PANEL
BUN: 13 mg/dL (ref 6–23)
CO2: 32 mEq/L (ref 19–32)
Calcium: 9.2 mg/dL (ref 8.4–10.5)
Creatinine, Ser: 0.7 mg/dL (ref 0.50–1.10)
GFR calc non Af Amer: 77 mL/min — ABNORMAL LOW (ref >90)
Glucose, Bld: 101 mg/dL — ABNORMAL HIGH (ref 70–99)
Sodium: 144 mEq/L (ref 135–145)

## 2012-05-07 MED ORDER — OXYCODONE-ACETAMINOPHEN 5-325 MG PO TABS
1.0000 | ORAL_TABLET | Freq: Once | ORAL | Status: AC
  Administered 2012-05-07: 1 via ORAL
  Filled 2012-05-07: qty 1

## 2012-05-07 MED ORDER — TETANUS-DIPHTH-ACELL PERTUSSIS 5-2.5-18.5 LF-MCG/0.5 IM SUSP
0.5000 mL | Freq: Once | INTRAMUSCULAR | Status: AC
  Administered 2012-05-07: 0.5 mL via INTRAMUSCULAR
  Filled 2012-05-07: qty 0.5

## 2012-05-07 MED ORDER — LIDOCAINE HCL 2 % IJ SOLN
20.0000 mL | Freq: Once | INTRAMUSCULAR | Status: AC
  Administered 2012-05-07: 400 mg

## 2012-05-07 NOTE — ED Notes (Signed)
Patient discharged home with daughter.  Patient is to follow up with family doctor and wound care center as instructed

## 2012-05-07 NOTE — ED Notes (Signed)
EMS-pt with fall this am. Pt with laceration to right knee. Pt with significant amount of bilateral lower extremity. Weeping noted from laceration. Pt is on blood thinners. Pt did not hit head or pass out.

## 2012-05-07 NOTE — ED Provider Notes (Signed)
History     CSN: [id number]  Arrival date & time 05/07/12  4098   First MD Initiated Contact with Patient 05/07/12 [phone]      Chief Complaint  Patient presents with  . Fall  . Extremity Laceration    (Consider location/radiation/quality/duration/timing/severity/associated sxs/prior treatment) Patient is a 78 y.o. female presenting with fall. The history is provided by the patient.  Fall  She says her legs gave out on her and she fell appears complaining of pain in both knees and suffered a laceration to her right knee. She denies other injury. Denies hitting her head denies neck and back pain. She has chronic abdominal pain and back pain which is unchanged.  Past Medical History  Diagnosis Date  . DVT (deep venous thrombosis)   . Hypertension   . COPD (chronic obstructive pulmonary disease)   . GERD (gastroesophageal reflux disease)   . Osteoarthritis   . Ischemic colitis   . Depression   . Gastric erosion     Past Surgical History  Procedure Date  . Total hip arthroplasty     bilateral  . Knee arthroplasty     bilateral  . Esophagogastroduodenoscopy 10/17/2007    erosion   . Colonoscopy   . Back surgery   . Abdominal hysterectomy   . Cholecystectomy   . Cataracts     Family History  Problem Relation Age of Onset  . Rheum arthritis Mother   . Ulcers Father     History  Substance Use Topics  . Smoking status: Never Smoker   . Smokeless tobacco: Never Used  . Alcohol Use: No    OB History    Grav Para Term Preterm Abortions TAB SAB Ect Mult Living                  Review of Systems  All other systems reviewed and are negative.    Allergies  Iohexol; Latex; and Penicillins  Home Medications   Current Outpatient Rx  Name Route Sig Dispense Refill  . AMITRIPTYLINE HCL 75 MG PO TABS Oral Take 225 mg by mouth at bedtime.    Marland Kitchen AMLODIPINE BESYLATE 2.5 MG PO TABS Oral Take 2.5 mg by mouth daily.    Marland Kitchen VITAMIN D 1000 UNITS PO CAPS Oral Take 5,000  Units by mouth daily.    . FUROSEMIDE 20 MG PO TABS Oral Take 1 tablet (20 mg total) by mouth every other day. 30 tablet 0  . GABAPENTIN 300 MG PO CAPS Oral Take 300 mg by mouth 2 (two) times daily as needed. For pain    . HYDROCODONE-ACETAMINOPHEN 10-325 MG PO TABS Oral Take 1 tablet by mouth every 4 (four) hours as needed. For pain    . OMEPRAZOLE 20 MG PO CPDR Oral Take 20 mg by mouth daily as needed. Acid reflux    . POTASSIUM CHLORIDE CRYS ER 20 MEQ PO TBCR Oral Take 1 tablet (20 mEq total) by mouth daily.    Marland Kitchen ROPINIROLE HCL 1 MG PO TABS Oral Take 1-2 mg by mouth See admin instructions. Take 1 to 2 tablets by mouth at bedtime for restless legs.    . WARFARIN SODIUM 5 MG PO TABS Oral Take 2.5-5 mg by mouth daily. Mon, wed, fri 2.5 mg tues, thur, sat, sun 5 mg      BP 141/60  Pulse 73  Temp 97.9 F (36.6 C)  Resp 20  SpO2 95%  Physical Exam  Nursing note and vitals reviewed. 78year old female,  resting comfortably and in no acute distress. Vital signs are significant for borderline hypertension with blood pressure 141/60. Oxygen saturation is 95%, which is normal. Head is normocephalic and atraumatic. PERRLA, EOMI. Oropharynx is clear. Neck is nontender and supple without adenopathy or JVD. Back is nontender and there is no CVA tenderness. Lungs are clear without rales, wheezes, or rhonchi. Chest is nontender. Heart has regular rate and rhythm without murmur. Abdomen is soft, flat, nontender without masses or hepatosplenomegaly and peristalsis is normoactive. Extremities have 3+ edema. UNA boot is present bilaterally and not removed. Both knees have moderate tenderness without point tenderness. There are 2 separate lacerations across the anterior aspect of the right knee. . Skin is warm and dry without rash. Neurologic: Mental status is normal, cranial nerves are intact, there are no motor or sensory deficits.   ED Course  Procedures (including critical care time)  Results for  orders placed during the hospital encounter of 05/07/12  PROTIME-INR      Component Value Range   Prothrombin Time 28.1 (*) 11.6 - 15.2 seconds   INR 2.58 (*) 0.00 - 1.49  BASIC METABOLIC PANEL      Component Value Range   Sodium 144  135 - 145 mEq/L   Potassium 4.0  3.5 - 5.1 mEq/L   Chloride 106  96 - 112 mEq/L   CO2 32  19 - 32 mEq/L   Glucose, Bld 101 (*) 70 - 99 mg/dL   BUN 13  6 - 23 mg/dL   Creatinine, Ser 0.45  0.50 - 1.10 mg/dL   Calcium 9.2  8.4 - 40.9 mg/dL   GFR calc non Af Amer 77 (*) >90 mL/min   GFR calc Af Amer 90 (*) >90 mL/min  CBC WITH DIFFERENTIAL      Component Value Range   WBC 9.1  4.0 - 10.5 K/uL   RBC 3.62 (*) 3.87 - 5.11 MIL/uL   Hemoglobin 10.9 (*) 12.0 - 15.0 g/dL   HCT 81.1 (*) 91.4 - 78.2 %   MCV 90.9  78.0 - 100.0 fL   MCH 30.1  26.0 - 34.0 pg   MCHC 33.1  30.0 - 36.0 g/dL   RDW 95.6  21.3 - 08.6 %   Platelets 271  150 - 400 K/uL   Neutrophils Relative 66  43 - 77 %   Neutro Abs 6.0  1.7 - 7.7 K/uL   Lymphocytes Relative 15  12 - 46 %   Lymphs Abs 1.4  0.7 - 4.0 K/uL   Monocytes Relative 13 (*) 3 - 12 %   Monocytes Absolute 1.2 (*) 0.1 - 1.0 K/uL   Eosinophils Relative 5  0 - 5 %   Eosinophils Absolute 0.4  0.0 - 0.7 K/uL   Basophils Relative 0  0 - 1 %   Basophils Absolute 0.0  0.0 - 0.1 K/uL   Dg Knee Complete 4 Views Left  05/07/2012  *RADIOLOGY REPORT*  Clinical Data: Fall, pain, laceration.  LEFT KNEE - COMPLETE 4+ VIEW  Comparison: 02/05/2012  Findings: Prior left knee replacement.  No complicating feature. No acute bony abnormality.  No joint effusion.  IMPRESSION: Left knee replacement.  No acute findings.  Original Report Authenticated By: Cyndie Chime, M.D.      1. Fall   2. Laceration of knee, right   3. Contusion of knee, left   4. Contusion of knee, right       MDM  Fall with laceration of right knee. Skin  appears strong enough to accept suturing. X-rays are ordered.  X-rays are unremarkable. Laceration repair is  done by Pixie Casino PA-C.        Dione Booze, MD 05/09/12 [phone]

## 2012-05-07 NOTE — ED Notes (Signed)
Family requesting to speak with Social Worker for possible placement for a few days.  Patient lives alone.

## 2012-05-07 NOTE — ED Notes (Signed)
Ortho to apply una boots and patient is to be discharged.

## 2012-05-07 NOTE — Progress Notes (Signed)
Orthopedic Tech Progress Note Patient Details:  [name] Walker 11/29/1924 [id number]  Ortho Devices Type of Ortho Device: Roland Rack boot Ortho Device/Splint Location: bilateral Ortho Device/Splint Interventions: Application   Kaidance Pantoja T 05/07/2012, 5:06 PM

## 2012-05-07 NOTE — ED Provider Notes (Signed)
LACERATION REPAIR Performed by: Pixie Casino Authorized by: Pixie Casino Consent: Verbal consent obtained. Risks and benefits: risks, benefits and alternatives were discussed Consent given by: patient Patient identity confirmed: provided demographic data Prepped and Draped in normal sterile fashion Wound explored  Laceration Location: right knee  Laceration Length: laceration # 1: 1.5cm laceration # 2 6cm  No Foreign Bodies seen or palpated  Anesthesia: local infiltration  Local anesthetic:  Lidocaine 1%  Anesthetic total: 10 ml  Irrigation method: syringe Amount of cleaning: standard  Skin closure: well approximated  Number of sutures: 23  Technique: simple interrupted  Patient tolerance: Patient tolerated the procedure well with no immediate complications.   Pixie Casino, PA-C 05/07/12 1621

## 2012-05-07 NOTE — ED Notes (Signed)
Spoke with [facility] concerning wound care for legs.  Patient is to be placed in una boots until she can follow up with Home Health or Wound Care.

## 2012-05-07 NOTE — ED Notes (Signed)
Patient remains in radiology

## 2012-05-08 NOTE — ED Provider Notes (Signed)
Medical screening examination/treatment/procedure(s) were conducted as a shared visit with non-physician practitioner(s) and myself.  I personally evaluated the patient during the encounter   Dione Booze, MD 05/08/12 [phone]

## 2012-05-21 ENCOUNTER — Emergency Department (HOSPITAL_COMMUNITY)
Admission: EM | Admit: 2012-05-21 | Discharge: 2012-05-21 | Disposition: A | Discharge status: Home / Self Care | Payer: Medicare Other | Attending: Emergency Medicine | Admitting: Emergency Medicine

## 2012-05-21 ENCOUNTER — Emergency Department (HOSPITAL_COMMUNITY): Payer: Medicare Other

## 2012-05-21 ENCOUNTER — Encounter (HOSPITAL_COMMUNITY): Payer: Self-pay | Admitting: Emergency Medicine

## 2012-05-21 DIAGNOSIS — M79609 Pain in unspecified limb: Secondary | ICD-10-CM | POA: Insufficient documentation

## 2012-05-21 DIAGNOSIS — Z4802 Encounter for removal of sutures: Secondary | ICD-10-CM | POA: Insufficient documentation

## 2012-05-21 DIAGNOSIS — Z86718 Personal history of other venous thrombosis and embolism: Secondary | ICD-10-CM | POA: Insufficient documentation

## 2012-05-21 DIAGNOSIS — R5383 Other fatigue: Secondary | ICD-10-CM | POA: Insufficient documentation

## 2012-05-21 DIAGNOSIS — Z7901 Long term (current) use of anticoagulants: Secondary | ICD-10-CM | POA: Insufficient documentation

## 2012-05-21 DIAGNOSIS — L0390 Cellulitis, unspecified: Secondary | ICD-10-CM

## 2012-05-21 DIAGNOSIS — R609 Edema, unspecified: Secondary | ICD-10-CM | POA: Insufficient documentation

## 2012-05-21 DIAGNOSIS — R0602 Shortness of breath: Secondary | ICD-10-CM | POA: Insufficient documentation

## 2012-05-21 DIAGNOSIS — R5381 Other malaise: Secondary | ICD-10-CM | POA: Insufficient documentation

## 2012-05-21 DIAGNOSIS — L0291 Cutaneous abscess, unspecified: Secondary | ICD-10-CM | POA: Insufficient documentation

## 2012-05-21 DIAGNOSIS — I10 Essential (primary) hypertension: Secondary | ICD-10-CM | POA: Insufficient documentation

## 2012-05-21 LAB — COMPREHENSIVE METABOLIC PANEL
ALT: 23 U/L (ref 0–35)
Albumin: 3.2 g/dL — ABNORMAL LOW (ref 3.5–5.2)
Alkaline Phosphatase: 160 U/L — ABNORMAL HIGH (ref 39–117)
Calcium: 9 mg/dL (ref 8.4–10.5)
GFR calc Af Amer: 89 mL/min — ABNORMAL LOW (ref >90)
Glucose, Bld: 112 mg/dL — ABNORMAL HIGH (ref 70–99)
Potassium: 3.6 mEq/L (ref 3.5–5.1)
Sodium: 140 mEq/L (ref 135–145)
Total Protein: 6.6 g/dL (ref 6.0–8.3)

## 2012-05-21 LAB — URINALYSIS, ROUTINE W REFLEX MICROSCOPIC
Bilirubin Urine: NEGATIVE
Hgb urine dipstick: NEGATIVE
Ketones, ur: NEGATIVE mg/dL
Protein, ur: NEGATIVE mg/dL
Urobilinogen, UA: 1 mg/dL (ref 0.0–1.0)

## 2012-05-21 LAB — CBC WITH DIFFERENTIAL/PLATELET
Basophils Absolute: 0 10*3/uL (ref 0.0–0.1)
Basophils Relative: 0 % (ref 0–1)
Eosinophils Absolute: 0.3 10*3/uL (ref 0.0–0.7)
Lymphs Abs: 1.2 10*3/uL (ref 0.7–4.0)
MCH: 30.2 pg (ref 26.0–34.0)
MCHC: 33.2 g/dL (ref 30.0–36.0)
Neutrophils Relative %: 67 % (ref 43–77)
Platelets: 352 10*3/uL (ref 150–400)
RBC: 3.25 MIL/uL — ABNORMAL LOW (ref 3.87–5.11)

## 2012-05-21 LAB — PROTIME-INR
INR: 3.42 — ABNORMAL HIGH (ref 0.00–1.49)
Prothrombin Time: 35 seconds — ABNORMAL HIGH (ref 11.6–15.2)

## 2012-05-21 MED ORDER — FENTANYL CITRATE 0.05 MG/ML IJ SOLN
50.0000 ug | INTRAMUSCULAR | Status: AC
  Administered 2012-05-21: 50 ug via INTRAVENOUS
  Filled 2012-05-21: qty 2

## 2012-05-21 MED ORDER — CLINDAMYCIN HCL 300 MG PO CAPS: 300.0000 mg | ORAL_CAPSULE | Freq: Four times a day (QID) | ORAL | Status: DC

## 2012-05-21 MED ORDER — CLINDAMYCIN PHOSPHATE 600 MG/50ML IV SOLN
600.0000 mg | Freq: Once | INTRAVENOUS | Status: AC
  Administered 2012-05-21: 600 mg via INTRAVENOUS
  Filled 2012-05-21: qty 50

## 2012-05-21 NOTE — ED Notes (Signed)
RN spoke with pt dtr Bonita Quin who states she is on her way to get pt. Dtr lives in [place] and states it will take a while.

## 2012-05-21 NOTE — ED Provider Notes (Signed)
History     CSN: [id number]  Arrival date & time 05/21/12  1454   First MD Initiated Contact with Patient 05/21/12 1524      Chief Complaint  Patient presents with  . Leg Pain    (Consider location/radiation/quality/duration/timing/severity/associated sxs/prior treatment) The history is provided by the patient.  [name] Walker is a 78 y.o. female hx of DVT, HTN, arthritis here with R knee pain and redness. She was here 2 weeks ago after falling onto the knee. She had stiches placed and xrays showed no fracture. She saw her wound specialist a week ago, who didn't remove the stitches because the wound was not healing well. For the last several days, she noticed more redness around R knee. No falls. She also had increasing pain in that knee. No fevers at home but she has difficulty ambulating at baseline.    Past Medical History  Diagnosis Date  . DVT (deep venous thrombosis)   . Hypertension   . COPD (chronic obstructive pulmonary disease)   . GERD (gastroesophageal reflux disease)   . Osteoarthritis   . Ischemic colitis   . Depression   . Gastric erosion     Past Surgical History  Procedure Date  . Total hip arthroplasty     bilateral  . Knee arthroplasty     bilateral  . Esophagogastroduodenoscopy 10/17/2007    erosion   . Colonoscopy   . Back surgery   . Abdominal hysterectomy   . Cholecystectomy   . Cataracts     Family History  Problem Relation Age of Onset  . Rheum arthritis Mother   . Ulcers Father     History  Substance Use Topics  . Smoking status: Never Smoker   . Smokeless tobacco: Never Used  . Alcohol Use: No    OB History    Grav Para Term Preterm Abortions TAB SAB Ect Mult Living                  Review of Systems  Musculoskeletal: Positive for joint swelling.  Skin: Positive for color change.  All other systems reviewed and are negative.    Allergies  Iohexol; Latex; and Penicillins  Home Medications   Current Outpatient Rx   Name Route Sig Dispense Refill  . AMITRIPTYLINE HCL 75 MG PO TABS Oral Take 225 mg by mouth at bedtime.    Marland Kitchen AMLODIPINE BESYLATE 2.5 MG PO TABS Oral Take 2.5 mg by mouth daily.    Marland Kitchen VITAMIN D 1000 UNITS PO CAPS Oral Take 2,000 Units by mouth daily.     . FUROSEMIDE 20 MG PO TABS Oral Take 1 tablet (20 mg total) by mouth every other day. 30 tablet 0  . GABAPENTIN 300 MG PO CAPS Oral Take 300 mg by mouth 2 (two) times daily as needed. For pain    . HYDROCODONE-ACETAMINOPHEN 10-325 MG PO TABS Oral Take 1 tablet by mouth every 4 (four) hours as needed. For pain    . OMEPRAZOLE 20 MG PO CPDR Oral Take 20 mg by mouth daily as needed. Acid reflux    . POTASSIUM CHLORIDE CRYS ER 20 MEQ PO TBCR Oral Take 1 tablet (20 mEq total) by mouth daily.    Marland Kitchen ROPINIROLE HCL 1 MG PO TABS Oral Take 1-2 mg by mouth at bedtime. Take 1 to 2 tablets by mouth at bedtime for restless legs.    . WARFARIN SODIUM 5 MG PO TABS Oral Take 5-7.5 mg by mouth daily. MWF 1  tab, 1.5 tab on all other days.      BP 146/64  Pulse 79  Temp 98.7 F (37.1 C) (Oral)  Resp 15  SpO2 98%  Physical Exam  Nursing note and vitals reviewed. Constitutional: She is oriented to person, place, and time.       Uncomfortable, chronically ill  HENT:  Head: Normocephalic.  Mouth/Throat: Oropharynx is clear and moist.  Eyes: Conjunctivae are normal. Pupils are equal, round, and reactive to light.  Neck: Normal range of motion. Neck supple.  Pulmonary/Chest: Effort normal and breath sounds normal.  Abdominal: Soft. Bowel sounds are normal.  Musculoskeletal:       Able to range R knee. At the proximal tib/fib there is a 3 inch laceration with sutures in place. The wound is healing with no purulent discharge Also there is minimal surrounding erythema that encompass part of the knee.The knee ROM is nl.   There is pitting edema bil legs. Pulses palpable both legs. Mild bilateral calf tenderness  Neurological: She is alert and oriented to person,  place, and time.  Skin: There is erythema.  Psychiatric: She has a normal mood and affect. Her behavior is normal. Judgment and thought content normal.    ED Course  SUTURE REMOVAL Date/Time: 05/21/2012 5:39 PM Performed by: Richardean Canal Authorized by: Richardean Canal Consent: Verbal consent obtained. Risks and benefits: risks, benefits and alternatives were discussed Consent given by: patient Patient understanding: patient states understanding of the procedure being performed Patient consent: the patient's understanding of the procedure matches consent given Procedure consent: procedure consent matches procedure scheduled Relevant documents: relevant documents present and verified Test results: test results available and properly labeled Site marked: the operative site was marked Patient identity confirmed: verbally with patient and arm band Body area: lower extremity Location details: right knee Wound Appearance: clean and red Sutures Removed: 15 Post-removal: dressing applied Patient tolerance: Patient tolerated the procedure well with no immediate complications.   (including critical care time)   Labs Reviewed  COMPREHENSIVE METABOLIC PANEL - Abnormal; Notable for the following:    CO2 33 (*)     Glucose, Bld 112 (*)     Albumin 3.2 (*)     Alkaline Phosphatase 160 (*)     Total Bilirubin 0.2 (*)     GFR calc non Af Amer 77 (*)     GFR calc Af Amer 89 (*)     All other components within normal limits  CBC WITH DIFFERENTIAL - Abnormal; Notable for the following:    RBC 3.25 (*)     Hemoglobin 9.8 (*)     HCT 29.5 (*)     Monocytes Relative 16 (*)     Monocytes Absolute 1.5 (*)     All other components within normal limits  PROTIME-INR - Abnormal; Notable for the following:    Prothrombin Time 35.0 (*)     INR 3.42 (*)     All other components within normal limits  D-DIMER, QUANTITATIVE - Abnormal; Notable for the following:    D-Dimer, Quant 1.03 (*)     All other  components within normal limits  URINALYSIS, ROUTINE W REFLEX MICROSCOPIC - Abnormal; Notable for the following:    APPearance CLOUDY (*)     All other components within normal limits  CULTURE, BLOOD (ROUTINE X 2)  CULTURE, BLOOD (ROUTINE X 2)   Dg Chest 1 View  05/21/2012  *RADIOLOGY REPORT*  Clinical Data: Weakness and shortness of breath  CHEST -  1 VIEW  Comparison: 02/10/2012  Findings: Cardiac leads overlie the chest.  Heart size is normal. Lung volumes are low but clear.  No pleural effusion.  No acute osseous finding.  Mild cardiomegaly noted with central vascular congestion but no overt edema.  No pleural effusion.  Bilateral AC joint degenerative change noted.  IMPRESSION: Cardiomegaly but no focal finding or acute cardiopulmonary process otherwise.   Original Report Authenticated By: Harrel Lemon, M.D.    Dg Knee Complete 4 Views Right  05/21/2012  *RADIOLOGY REPORT*  Clinical Data: Weakness.  Right knee pain.  Erythema.  RIGHT KNEE - COMPLETE 4+ VIEW  Comparison: 05/07/2012  Findings: Stable appearance of knee prosthesis noted, without specific complicating feature.  Chronic proximal tibiofibular fusion is present.  Chronic deformity of the proximal tibia likely related to prior fractures.  Spurring along the medial margin of the medial femoral condyle.  IMPRESSION:  1.  Stable appearance of the prosthesis and proximal tibiofibular deformity.  No acute findings.   Original Report Authenticated By: Dellia Cloud, M.D.      1. Cellulitis       MDM  [name] Walker is a 78 y.o. female hx of DVT on coumadin, recent knee injury here with knee pain and swelling and redness. Given that her laceration is at proximal tibia and her knee ROM is normal she is unlikely to have septic knee. She has mild cellulitis so will try outpatient abx first. Will check INR, if elevated, will need to do bilateral dopplers to r/o DVT.   5:44 PM Patient reassessed. CBC showed nl WBC, CMP at  baseline. INR supratherapuetic at 3.4. D-dimer ordered by the nurse and is elevated (she will have an elevated D-dimer from recent fall and hx of DVT). Given INR high, will not need dopplers. Knee xray showed no fracture. She felt better after IV clinda. Will d/c home on clinda. Dressing applied to bilateral legs.          Richardean Canal, MD 05/21/12 [phone]

## 2012-05-21 NOTE — ED Notes (Signed)
[id number]<FA> Expected date:<BR> Expected time:<BR> Means of arrival:<BR> Comments:<BR> RCC- 78y/o upper thigh pain, ?dvt

## 2012-05-21 NOTE — ED Notes (Signed)
Pt. Returned from xray 

## 2012-05-21 NOTE — ED Notes (Signed)
Pt states pain to leg is 10/10 today. States she fell 2 weeks ago and had stitches to R knee. States RLE has increased redness, heat and pain past couple days. Pt states she has hx of DVT to RLE 2012.

## 2012-05-21 NOTE — ED Notes (Signed)
Pt to xray

## 2012-05-26 DIAGNOSIS — R609 Edema, unspecified: Secondary | ICD-10-CM

## 2012-05-27 LAB — CULTURE, BLOOD (ROUTINE X 2): Culture: NO GROWTH

## 2012-05-29 ENCOUNTER — Emergency Department (HOSPITAL_COMMUNITY)
Admission: EM | Admit: 2012-05-29 | Discharge: 2012-05-29 | Disposition: A | Discharge status: Home / Self Care | Payer: Medicare Other | Attending: Emergency Medicine | Admitting: Emergency Medicine

## 2012-05-29 ENCOUNTER — Encounter (HOSPITAL_COMMUNITY): Payer: Self-pay | Admitting: *Deleted

## 2012-05-29 DIAGNOSIS — Z8261 Family history of arthritis: Secondary | ICD-10-CM | POA: Insufficient documentation

## 2012-05-29 DIAGNOSIS — R791 Abnormal coagulation profile: Secondary | ICD-10-CM | POA: Insufficient documentation

## 2012-05-29 DIAGNOSIS — Z8489 Family history of other specified conditions: Secondary | ICD-10-CM | POA: Insufficient documentation

## 2012-05-29 DIAGNOSIS — Z91040 Latex allergy status: Secondary | ICD-10-CM | POA: Insufficient documentation

## 2012-05-29 DIAGNOSIS — I10 Essential (primary) hypertension: Secondary | ICD-10-CM | POA: Insufficient documentation

## 2012-05-29 DIAGNOSIS — Z880 Allergy status to penicillin: Secondary | ICD-10-CM | POA: Insufficient documentation

## 2012-05-29 DIAGNOSIS — R609 Edema, unspecified: Secondary | ICD-10-CM

## 2012-05-29 DIAGNOSIS — F329 Major depressive disorder, single episode, unspecified: Secondary | ICD-10-CM | POA: Insufficient documentation

## 2012-05-29 DIAGNOSIS — Z888 Allergy status to other drugs, medicaments and biological substances status: Secondary | ICD-10-CM | POA: Insufficient documentation

## 2012-05-29 DIAGNOSIS — F3289 Other specified depressive episodes: Secondary | ICD-10-CM | POA: Insufficient documentation

## 2012-05-29 LAB — BASIC METABOLIC PANEL
BUN: 9 mg/dL (ref 6–23)
Calcium: 9.1 mg/dL (ref 8.4–10.5)
GFR calc Af Amer: >90 mL/min (ref >90)
GFR calc non Af Amer: 79 mL/min — ABNORMAL LOW (ref >90)
Potassium: 3.5 mEq/L (ref 3.5–5.1)

## 2012-05-29 LAB — CBC WITH DIFFERENTIAL/PLATELET
Basophils Relative: 0 % (ref 0–1)
Eosinophils Absolute: 0.4 10*3/uL (ref 0.0–0.7)
Eosinophils Relative: 5 % (ref 0–5)
MCH: 30.5 pg (ref 26.0–34.0)
MCHC: 33.7 g/dL (ref 30.0–36.0)
MCV: 90.5 fL (ref 78.0–100.0)
Monocytes Relative: 16 % — ABNORMAL HIGH (ref 3–12)
Neutrophils Relative %: 60 % (ref 43–77)
Platelets: 337 10*3/uL (ref 150–400)

## 2012-05-29 MED ORDER — HYDROCODONE-ACETAMINOPHEN 5-325 MG PO TABS
2.0000 | ORAL_TABLET | Freq: Once | ORAL | Status: AC
  Administered 2012-05-29: 2 via ORAL
  Filled 2012-05-29: qty 2

## 2012-05-29 NOTE — ED Notes (Signed)
[id number]<VH> Expected date:<BR> Expected time:<BR> Means of arrival:<BR> Comments:<BR> Edema and pain in BLE

## 2012-05-29 NOTE — ED Provider Notes (Signed)
History     CSN: [id number]  Arrival date & time 05/29/12  1436   First MD Initiated Contact with Patient 05/29/12 1539      Chief Complaint  Patient presents with  . Leg Swelling    (Consider location/radiation/quality/duration/timing/severity/associated sxs/prior treatment) HPI Pt with history of DVT, peripheral edema and chronic leg wounds reports increased swelling for the last several days. She has chronic pain which is unchanged. She was seen by her wound care doctor in [place] earlier this week, started on Clinda for a wound to anterior R knee, and had Doppler Tuesday which reportedly showed chronic clot. She is taking Coumadin and has a known IVC filter in place. Denies any CP or SOB.   Past Medical History  Diagnosis Date  . DVT (deep venous thrombosis)   . Hypertension   . COPD (chronic obstructive pulmonary disease)   . GERD (gastroesophageal reflux disease)   . Osteoarthritis   . Ischemic colitis   . Depression   . Gastric erosion     Past Surgical History  Procedure Date  . Total hip arthroplasty     bilateral  . Knee arthroplasty     bilateral  . Esophagogastroduodenoscopy 10/17/2007    erosion   . Colonoscopy   . Back surgery   . Abdominal hysterectomy   . Cholecystectomy   . Cataracts     Family History  Problem Relation Age of Onset  . Rheum arthritis Mother   . Ulcers Father     History  Substance Use Topics  . Smoking status: Never Smoker   . Smokeless tobacco: Never Used  . Alcohol Use: No    OB History    Grav Para Term Preterm Abortions TAB SAB Ect Mult Living                  Review of Systems All other systems reviewed and are negative except as noted in HPI.   Allergies  Iohexol; Latex; and Penicillins  Home Medications   Current Outpatient Rx  Name Route Sig Dispense Refill  . AMITRIPTYLINE HCL 75 MG PO TABS Oral Take 225 mg by mouth at bedtime.    Marland Kitchen AMLODIPINE BESYLATE 2.5 MG PO TABS Oral Take 2.5 mg by mouth daily.      Marland Kitchen VITAMIN D 1000 UNITS PO CAPS Oral Take 2,000 Units by mouth daily.     Marland Kitchen CLINDAMYCIN HCL 300 MG PO CAPS Oral Take 300 mg by mouth 4 (four) times daily. X 7 days    . FUROSEMIDE 20 MG PO TABS Oral Take 1 tablet (20 mg total) by mouth every other day. 30 tablet 0  . GABAPENTIN 300 MG PO CAPS Oral Take 300 mg by mouth 2 (two) times daily as needed. For pain    . HYDROCODONE-ACETAMINOPHEN 10-325 MG PO TABS Oral Take 2 tablets by mouth every 4 (four) hours as needed. For pain    . OMEPRAZOLE 20 MG PO CPDR Oral Take 20 mg by mouth daily as needed. Acid reflux    . POTASSIUM CHLORIDE CRYS ER 20 MEQ PO TBCR Oral Take 1 tablet (20 mEq total) by mouth daily.    Marland Kitchen ROPINIROLE HCL 1 MG PO TABS Oral Take 1-2 mg by mouth at bedtime. Take 1 to 2 tablets by mouth at bedtime for restless legs.    . WARFARIN SODIUM 5 MG PO TABS Oral Take 5-7.5 mg by mouth daily. MWF 1 tab, 1.5 tab on all other days.  BP 145/65  Pulse 74  Temp 97.9 F (36.6 C) (Oral)  Resp 17  SpO2 96%  Physical Exam  Nursing note and vitals reviewed. Constitutional: She is oriented to person, place, and time. She appears well-developed and well-nourished.  HENT:  Head: Normocephalic and atraumatic.  Eyes: EOM are normal. Pupils are equal, round, and reactive to light.  Neck: Normal range of motion. Neck supple.  Cardiovascular: Normal rate, normal heart sounds and intact distal pulses.   Pulmonary/Chest: Effort normal and breath sounds normal.  Abdominal: Bowel sounds are normal. She exhibits no distension. There is no tenderness.  Musculoskeletal:       3+ pitting edema of bilateral LE with chronic skin changes. There is a shallow abrasion/laceration to R anterior knee with granulation tissue, no drainage; diffusely tender  Neurological: She is alert and oriented to person, place, and time. She has normal strength. No cranial nerve deficit or sensory deficit.  Skin: Skin is warm and dry. No rash noted.  Psychiatric: She has a  normal mood and affect.    ED Course  Procedures (including critical care time)  Labs Reviewed  CBC WITH DIFFERENTIAL - Abnormal; Notable for the following:    RBC 3.38 (*)     Hemoglobin 10.3 (*)     HCT 30.6 (*)     Monocytes Relative 16 (*)     Monocytes Absolute 1.2 (*)     All other components within normal limits  PROTIME-INR - Abnormal; Notable for the following:    Prothrombin Time 41.5 (*)     INR 4.25 (*)     All other components within normal limits  BASIC METABOLIC PANEL   No results found.   No diagnosis found.    MDM  Pt appropriately treated for both DVT and cellulitis by her doctors in [place]. Will check for therapeutic INR although given history of IVC filter doubt she would need admission for anticoagulation if she is subtherapeutic. Will obtain Doppler report from [facility] to confirm those findings.    4:58 PM Doppler from [facility] confirms non-obstructive chronic DVT INR here is supratherapeutic. She was actually started on Clinda in the ED a week ago for her leg wound infection (not at her wound doctor). As above it appears to be healing without current signs of infection.     Perri Lamagna B. Bernette Mayers, MD 05/29/12 1715

## 2012-05-29 NOTE — ED Notes (Signed)
Pt in from home, c/o BLE edema and pain x1 wk. Hx DVT. Hx of being seen at wound center, denies current wounds. No sob, resp distress noted.

## 2012-05-29 NOTE — ED Notes (Signed)
Patient given discharge instructions, information, prescriptions, and diet order. Patient states that they adequately understand discharge information given and to return to ED if symptoms return or worsen.     

## 2012-11-28 ENCOUNTER — Encounter (HOSPITAL_COMMUNITY): Payer: Self-pay | Admitting: Emergency Medicine

## 2012-11-28 ENCOUNTER — Emergency Department (HOSPITAL_COMMUNITY): Payer: Medicare Other

## 2012-11-28 ENCOUNTER — Emergency Department (HOSPITAL_COMMUNITY)
Admission: EM | Admit: 2012-11-28 | Discharge: 2012-11-29 | Disposition: A | Discharge status: Home / Self Care | Payer: Medicare Other | Attending: Emergency Medicine | Admitting: Emergency Medicine

## 2012-11-28 DIAGNOSIS — Z96659 Presence of unspecified artificial knee joint: Secondary | ICD-10-CM | POA: Insufficient documentation

## 2012-11-28 DIAGNOSIS — Z86718 Personal history of other venous thrombosis and embolism: Secondary | ICD-10-CM | POA: Insufficient documentation

## 2012-11-28 DIAGNOSIS — M7989 Other specified soft tissue disorders: Secondary | ICD-10-CM | POA: Insufficient documentation

## 2012-11-28 DIAGNOSIS — L02419 Cutaneous abscess of limb, unspecified: Secondary | ICD-10-CM | POA: Insufficient documentation

## 2012-11-28 DIAGNOSIS — Z8719 Personal history of other diseases of the digestive system: Secondary | ICD-10-CM | POA: Insufficient documentation

## 2012-11-28 DIAGNOSIS — Z79899 Other long term (current) drug therapy: Secondary | ICD-10-CM | POA: Insufficient documentation

## 2012-11-28 DIAGNOSIS — Z8739 Personal history of other diseases of the musculoskeletal system and connective tissue: Secondary | ICD-10-CM | POA: Insufficient documentation

## 2012-11-28 DIAGNOSIS — F329 Major depressive disorder, single episode, unspecified: Secondary | ICD-10-CM | POA: Insufficient documentation

## 2012-11-28 DIAGNOSIS — I10 Essential (primary) hypertension: Secondary | ICD-10-CM | POA: Insufficient documentation

## 2012-11-28 DIAGNOSIS — L0390 Cellulitis, unspecified: Secondary | ICD-10-CM

## 2012-11-28 DIAGNOSIS — K219 Gastro-esophageal reflux disease without esophagitis: Secondary | ICD-10-CM | POA: Insufficient documentation

## 2012-11-28 DIAGNOSIS — J449 Chronic obstructive pulmonary disease, unspecified: Secondary | ICD-10-CM | POA: Insufficient documentation

## 2012-11-28 DIAGNOSIS — F3289 Other specified depressive episodes: Secondary | ICD-10-CM | POA: Insufficient documentation

## 2012-11-28 DIAGNOSIS — Z96649 Presence of unspecified artificial hip joint: Secondary | ICD-10-CM | POA: Insufficient documentation

## 2012-11-28 DIAGNOSIS — Z7901 Long term (current) use of anticoagulants: Secondary | ICD-10-CM | POA: Insufficient documentation

## 2012-11-28 DIAGNOSIS — J4489 Other specified chronic obstructive pulmonary disease: Secondary | ICD-10-CM | POA: Insufficient documentation

## 2012-11-28 LAB — CBC WITH DIFFERENTIAL/PLATELET
Basophils Absolute: 0 10*3/uL (ref 0.0–0.1)
Basophils Relative: 0 % (ref 0–1)
Eosinophils Absolute: 0.3 10*3/uL (ref 0.0–0.7)
Eosinophils Relative: 4 % (ref 0–5)
HCT: 35.3 % — ABNORMAL LOW (ref 36.0–46.0)
Lymphocytes Relative: 22 % (ref 12–46)
MCH: 30.3 pg (ref 26.0–34.0)
MCHC: 33.4 g/dL (ref 30.0–36.0)
MCV: 90.5 fL (ref 78.0–100.0)
Monocytes Absolute: 1.1 10*3/uL — ABNORMAL HIGH (ref 0.1–1.0)
Platelets: 267 10*3/uL (ref 150–400)
RDW: 14.6 % (ref 11.5–15.5)
WBC: 6.9 10*3/uL (ref 4.0–10.5)

## 2012-11-28 LAB — COMPREHENSIVE METABOLIC PANEL
ALT: 15 U/L (ref 0–35)
AST: 19 U/L (ref 0–37)
Albumin: 3.6 g/dL (ref 3.5–5.2)
Calcium: 9 mg/dL (ref 8.4–10.5)
Creatinine, Ser: 1.14 mg/dL — ABNORMAL HIGH (ref 0.50–1.10)
GFR calc non Af Amer: 43 mL/min — ABNORMAL LOW (ref >90)
Sodium: 136 mEq/L (ref 135–145)
Total Protein: 7.4 g/dL (ref 6.0–8.3)

## 2012-11-28 LAB — PROTIME-INR: INR: 2.94 — ABNORMAL HIGH (ref 0.00–1.49)

## 2012-11-28 MED ORDER — CLINDAMYCIN HCL 300 MG PO CAPS: 300.0000 mg | ORAL_CAPSULE | Freq: Four times a day (QID) | ORAL | Status: DC

## 2012-11-28 MED ORDER — CLINDAMYCIN PHOSPHATE 600 MG/50ML IV SOLN
600.0000 mg | Freq: Once | INTRAVENOUS | Status: AC
  Administered 2012-11-28: 600 mg via INTRAVENOUS
  Filled 2012-11-28: qty 50

## 2012-11-28 MED ORDER — SODIUM CHLORIDE 0.9 % IV SOLN
Freq: Once | INTRAVENOUS | Status: AC
  Administered 2012-11-28: 22:00:00 via INTRAVENOUS

## 2012-11-28 MED ORDER — FUROSEMIDE 40 MG PO TABS
40.0000 mg | ORAL_TABLET | Freq: Once | ORAL | Status: AC
  Administered 2012-11-28: 40 mg via ORAL
  Filled 2012-11-28: qty 1

## 2012-11-28 NOTE — ED Notes (Signed)
AVW:[id number]<XB> Expected date:<BR> Expected time:<BR> Means of arrival:<BR> Comments:<BR> CLOSED

## 2012-11-28 NOTE — ED Provider Notes (Signed)
History     CSN: [id number]  Arrival date & time 11/28/12  2021   First MD Initiated Contact with Patient 11/28/12 2113      Chief Complaint  Patient presents with  . Leg Swelling    (Consider location/radiation/quality/duration/timing/severity/associated sxs/prior treatment) The history is provided by the patient.  [name] Walker is a 79 y.o. female history of DVT on Coumadin, hypertension, COPD here presenting with bilateral leg swelling. Was at wound clinic and she says she has worse swelling for both legs for the last 2 days. Her leg was wrapped 2 days ago and now she realizes very swollen. The leg is also more red than usual. She has a history of DVT and she is on Coumadin. Denies any shortness of breath. She also has an IVC filter. No fevers or chills. She was seen several months ago and was sent home on antibiotics. No hx of CHF and last echo was several years ago and was normal.     Past Medical History  Diagnosis Date  . DVT (deep venous thrombosis)   . Hypertension   . COPD (chronic obstructive pulmonary disease)   . GERD (gastroesophageal reflux disease)   . Osteoarthritis   . Ischemic colitis   . Depression   . Gastric erosion     Past Surgical History  Procedure Laterality Date  . Total hip arthroplasty      bilateral  . Knee arthroplasty      bilateral  . Esophagogastroduodenoscopy  10/17/2007    erosion   . Colonoscopy    . Back surgery    . Abdominal hysterectomy    . Cholecystectomy    . Cataracts      Family History  Problem Relation Age of Onset  . Rheum arthritis Mother   . Ulcers Father     History  Substance Use Topics  . Smoking status: Never Smoker   . Smokeless tobacco: Never Used  . Alcohol Use: No    OB History   Grav Para Term Preterm Abortions TAB SAB Ect Mult Living                  Review of Systems  Musculoskeletal:       Leg swelling   All other systems reviewed and are negative.    Allergies  Iohexol; Latex;  Penicillins; and Shellfish allergy  Home Medications   Current Outpatient Rx  Name  Route  Sig  Dispense  Refill  . amitriptyline (ELAVIL) 75 MG tablet   Oral   Take 225 mg by mouth at bedtime.         Marland Kitchen amLODipine (NORVASC) 2.5 MG tablet   Oral   Take 2.5 mg by mouth daily.         . Cholecalciferol (VITAMIN D) 1000 UNITS capsule   Oral   Take 2,000 Units by mouth daily.          . furosemide (LASIX) 20 MG tablet   Oral   Take 1 tablet (20 mg total) by mouth every other day.   30 tablet   0   . gabapentin (NEURONTIN) 300 MG capsule   Oral   Take 300 mg by mouth 2 (two) times daily as needed. For pain         . HYDROcodone-acetaminophen (NORCO) 10-325 MG per tablet   Oral   Take 2 tablets by mouth every 4 (four) hours as needed. For pain         .  omeprazole (PRILOSEC) 20 MG capsule   Oral   Take 20 mg by mouth daily as needed. Acid reflux         . potassium chloride SA (K-DUR,KLOR-CON) 20 MEQ tablet   Oral   Take 1 tablet (20 mEq total) by mouth daily.         Marland Kitchen rOPINIRole (REQUIP) 1 MG tablet   Oral   Take 1-2 mg by mouth at bedtime. Take 1 to 2 tablets by mouth at bedtime for restless legs.         . warfarin (COUMADIN) 5 MG tablet   Oral   Take 5-7.5 mg by mouth daily. MWF 1 tab, 1.5 tab on all other days.           BP 143/113  Pulse 82  Temp(Src) 98.4 F (36.9 C) (Oral)  Resp 16  SpO2 96%  Physical Exam  Nursing note and vitals reviewed. Constitutional: She is oriented to person, place, and time. She appears well-developed and well-nourished.  HENT:  Head: Normocephalic.  Mouth/Throat: Oropharynx is clear and moist.  Eyes: Conjunctivae are normal. Pupils are equal, round, and reactive to light.  Neck: Normal range of motion. Neck supple.  Cardiovascular: Normal rate, regular rhythm and normal heart sounds.   Pulmonary/Chest: Effort normal and breath sounds normal. No respiratory distress. She has no wheezes. She has no rales.   Abdominal: Soft. She exhibits no distension. There is no tenderness. There is no rebound.  Musculoskeletal:  2+ pitting edema. + diffuse redness and warm. ? Bilateral calf tenderness.   Neurological: She is alert and oriented to person, place, and time.  Skin: Skin is warm and dry.  Psychiatric: She has a normal mood and affect. Her behavior is normal. Judgment and thought content normal.    ED Course  Procedures (including critical care time)  Labs Reviewed  CBC WITH DIFFERENTIAL - Abnormal; Notable for the following:    Hemoglobin 11.8 (*)    HCT 35.3 (*)    Monocytes Relative 16 (*)    Monocytes Absolute 1.1 (*)    All other components within normal limits  COMPREHENSIVE METABOLIC PANEL - Abnormal; Notable for the following:    Creatinine, Ser 1.14 (*)    Alkaline Phosphatase 143 (*)    GFR calc non Af Amer 43 (*)    GFR calc Af Amer 49 (*)    All other components within normal limits  PROTIME-INR - Abnormal; Notable for the following:    Prothrombin Time 29.1 (*)    INR 2.94 (*)    All other components within normal limits  PRO B NATRIURETIC PEPTIDE  TROPONIN I   Dg Chest 2 View  11/28/2012  *RADIOLOGY REPORT*  Clinical Data: Leg swelling.  CHEST - 2 VIEW  Comparison: Chest radiograph performed 11/10/2012  Findings: The lungs are hypoexpanded; mild bibasilar opacities may reflect atelectasis or possibly mild pneumonia.  There is no evidence of pleural effusion or pneumothorax.  The heart is borderline normal in size.  No acute osseous abnormalities are seen.  IMPRESSION: Lungs hypoexpanded; mild bibasilar opacities may reflect atelectasis or possibly mild pneumonia.   Original Report Authenticated By: Tonia Ghent, M.D.      No diagnosis found.   Date: 11/28/2012  Rate: 78  Rhythm: normal sinus rhythm  QRS Axis: normal  Intervals: normal  ST/T Wave abnormalities: normal  Conduction Disutrbances:nonspecific intraventricular conduction delay  Narrative Interpretation:    Old EKG Reviewed: unchanged    MDM  Felipa Eth  Zech is a 79 y.o. female here with bilateral leg swelling and erythema, likely cellulitis. Will check INR, if therapeutic, will likely not need imaging. She doesn't have hx of chronic DVT and she has a filter already. Will give IV clinda for cellulitis.   10:54 PM CXR showed possible atelectasis. I added on BNP since she has some crackles bilateral lung bases and bilateral swollen legs to r/o CHF. BNP normal so I don't think she has CHF. INR therapeutic. She told me she hasn't been taking her lasix 80mg  as prescribed because she urinates too much. I prescribed clinda for her. I also told her to take lasix as prescribed. Return precautions given.         Richardean Canal, MD 11/28/12 2257

## 2012-11-28 NOTE — ED Notes (Signed)
Per EMS: Pt is from home. Pt c/o bilateral leg pitting edema. Pt is a patient at a Wound Clinic and was given constrictive bands to reduce swelling. Pt was instructed to remove bands and go to an ED if she had any leg swelling that made wearing the constriction bands uncomfortable.

## 2012-12-20 ENCOUNTER — Other Ambulatory Visit: Payer: Self-pay | Admitting: *Deleted

## 2012-12-20 MED ORDER — ROPINIROLE HCL 1 MG PO TABS: 1.0000 mg | ORAL_TABLET | Freq: Every day | ORAL | Status: DC

## 2012-12-22 ENCOUNTER — Other Ambulatory Visit: Payer: Self-pay | Admitting: *Deleted

## 2012-12-22 MED ORDER — HYDROXYZINE HCL 25 MG PO TABS: 25.0000 mg | ORAL_TABLET | Freq: Three times a day (TID) | ORAL | Status: DC | PRN

## 2012-12-31 ENCOUNTER — Other Ambulatory Visit (INDEPENDENT_AMBULATORY_CARE_PROVIDER_SITE_OTHER): Payer: Medicare Other

## 2012-12-31 DIAGNOSIS — I4891 Unspecified atrial fibrillation: Secondary | ICD-10-CM

## 2012-12-31 LAB — PROTIME-INR
INR: 3.73 — ABNORMAL HIGH (ref <1.50)
Prothrombin Time: 35 seconds — ABNORMAL HIGH (ref 11.6–15.2)

## 2012-12-31 LAB — POCT INR
INR: 3.73
INR: 3.73

## 2012-12-31 NOTE — Progress Notes (Signed)
Home health nurse dropped off the blood work for send out only

## 2013-01-01 ENCOUNTER — Telehealth: Payer: Self-pay | Admitting: Pharmacist

## 2013-01-01 ENCOUNTER — Ambulatory Visit (INDEPENDENT_AMBULATORY_CARE_PROVIDER_SITE_OTHER): Payer: Medicare Other | Admitting: Pharmacist

## 2013-01-01 NOTE — Telephone Encounter (Signed)
Dickey Gave- nurse with [facility]- who drew INR.  [employer] results and dose change for Mrs. [name] Walker.   Hold warfarin for 1 day, then decrease to 5mg  qd. Recheck protime in 7 to 10 days.  Pt notifies as well

## 2013-01-06 LAB — POCT INR: INR: 2.3

## 2013-01-07 ENCOUNTER — Ambulatory Visit (INDEPENDENT_AMBULATORY_CARE_PROVIDER_SITE_OTHER): Payer: Medicare Other | Admitting: Pharmacist

## 2013-01-07 DIAGNOSIS — Z7901 Long term (current) use of anticoagulants: Secondary | ICD-10-CM

## 2013-01-14 ENCOUNTER — Ambulatory Visit (INDEPENDENT_AMBULATORY_CARE_PROVIDER_SITE_OTHER): Payer: Medicare Other | Admitting: Pharmacist

## 2013-01-14 DIAGNOSIS — I4891 Unspecified atrial fibrillation: Secondary | ICD-10-CM

## 2013-01-16 ENCOUNTER — Other Ambulatory Visit: Payer: Self-pay | Admitting: Nurse Practitioner

## 2013-01-16 ENCOUNTER — Encounter: Payer: Self-pay | Admitting: Nurse Practitioner

## 2013-01-16 ENCOUNTER — Ambulatory Visit (INDEPENDENT_AMBULATORY_CARE_PROVIDER_SITE_OTHER): Payer: Medicare Other | Admitting: Nurse Practitioner

## 2013-01-16 VITALS — BP 130/65 | HR 77 | Temp 96.9°F | Ht <= 58 in | Wt 210.0 lb

## 2013-01-16 DIAGNOSIS — I872 Venous insufficiency (chronic) (peripheral): Secondary | ICD-10-CM

## 2013-01-16 DIAGNOSIS — I8310 Varicose veins of unspecified lower extremity with inflammation: Secondary | ICD-10-CM

## 2013-01-16 MED ORDER — FUROSEMIDE 40 MG PO TABS: 40.0000 mg | ORAL_TABLET | Freq: Every morning | ORAL | Status: DC

## 2013-01-16 MED ORDER — FUROSEMIDE 40 MG PO TABS: 40.0000 mg | ORAL_TABLET | Freq: Two times a day (BID) | ORAL | Status: DC

## 2013-01-16 MED ORDER — CIPROFLOXACIN HCL 500 MG PO TABS: 500.0000 mg | ORAL_TABLET | Freq: Two times a day (BID) | ORAL | Status: DC

## 2013-01-16 NOTE — Progress Notes (Signed)
  Subjective:    Patient ID: [name] Walker DOBIS, female    DOB: Sep 10, 1925, 79 y.o.   MRN: [id number]  HPI  Patient in with peripheral edema and reddness. Home health came out 2 days ago and wrapped her legs with unna boots. Now swollen above where unna boots were.    Review of Systems  Constitutional: Negative.   HENT: Negative.   Eyes: Negative.   Respiratory: Negative.   Cardiovascular: Positive for leg swelling.       Objective:   Physical Exam  Constitutional: She appears well-developed and well-nourished.  Cardiovascular: Normal rate, regular rhythm and normal heart sounds.   Pulses:      Posterior tibial pulses are 2+ on the right side, and 2+ on the left side.  Pulmonary/Chest: Effort normal and breath sounds normal.  Musculoskeletal: She exhibits edema (bil lower extremities but mainly above where unna boots werw. Erythemaous and tender totouch).  Skin: Skin is warm. There is erythema (bil lower ext).   BP 130/65  Pulse 77  Temp(Src) 96.9 F (36.1 C) (Oral)  Ht 4\' 9"  (1.448 m)  Wt 210 lb (95.255 kg)  BMI 45.43 kg/m2      Assessment & Plan:  1. Venous stasis dermatitis, unspecified laterality *Elevate legs when sitting - furosemide (LASIX) 40 MG tablet; Take 1 tablet (40 mg total) by mouth BID.  Dispense: 60 tablet; Refill: 3 - ciprofloxacin (CIPRO) 500 MG tablet; Take 1 tablet (500 mg total) by mouth 2 (two) times daily.  Dispense: 20 tablet; Refill: 0 - COMPLETE METABOLIC PANEL WITH GFR  Mary-Margaret Daphine Deutscher, FNP

## 2013-01-16 NOTE — Patient Instructions (Signed)
Venous Stasis and Chronic Venous Insufficiency As people age, the veins located in their legs may weaken and stretch. When veins weaken and lose the ability to pump blood effectively, the condition is called chronic venous insufficiency (CVI) or venous stasis. Almost all veins return blood back to the heart. This happens by:  The force of the heart pumping fresh blood pushes blood back to the heart.  Blood flowing to the heart from the force of gravity. In the deep veins of the legs, blood has to fight gravity and flow upstream back to the heart. Here, the leg muscles contract to pump blood back toward the heart. Vein walls are elastic, and many veins have small valves that only allow blood to flow in one direction. When leg muscles contract, they push inward against the elastic vein walls. This squeezes blood upward, opens the valves, and moves blood toward the heart. When leg muscles relax, the vein wall also relaxes and the valves inside the vein close to prevent blood from flowing backward. This method of pumping blood out of the legs is called the venous pump. CAUSES  The venous pump works best while walking and leg muscles are contracting. But when a person sits or stands, blood pressure in leg veins can build. Deep veins are usually able to withstand short periods of inactivity, but long periods of inactivity (and increased pressure) can stretch, weaken, and damage vein walls. High blood pressure can also stretch and damage vein walls. The veins may no longer be able to pump blood back to the heart. Venous hypertension (high blood pressure inside veins) that lasts over time is a primary cause of CVI. CVI can also be caused by:   Deep vein thrombosis, a condition where a thrombus (blood clot) blocks blood flow in a vein.  Phlebitis, an inflammation of a superficial vein that causes a blood clot to form. Other risk factors for CVI may include:   Heredity.  Obesity.  Pregnancy.  Sedentary  lifestyle.  Smoking.  Jobs requiring long periods of standing or sitting in one place.  Age and gender:  Women in their 40's and 50's and men in their 70's are more prone to developing CVI. SYMPTOMS  Symptoms of CVI may include:   Varicose veins.  Ulceration or skin breakdown.  Lipodermatosclerosis, a condition that affects the skin just above the ankle, usually on the inside surface. Over time the skin becomes brown, smooth, tight and often painful. Those with this condition have a high risk of developing skin ulcers.  Reddened or discolored skin on the leg.  Swelling. DIAGNOSIS  Your caregiver can diagnose CVI after performing a careful medical history and physical examination. To confirm the diagnosis, the following tests may also be ordered:   Duplex ultrasound.  Plethysmography (tests blood flow).  Venograms (x-ray using a special dye). TREATMENT The goals of treatment for CVI are to restore a person to an active life and to minimize pain or disability. Typically, CVI does not pose a serious threat to life or limb, and with proper treatment most people with this condition can continue to lead active lives. In most cases, mild CVI can be treated on an outpatient basis with simple procedures. Treatment methods include:   Elastic compression socks.  Sclerotherapy, a procedure involving an injection of a material that "dissolves" the damaged veins. Other veins in the network of blood vessels take over the function of the damaged veins.  Vein stripping (an older procedure less commonly used).    Laser Ablation surgery.  Valve repair. HOME CARE INSTRUCTIONS   Elastic compression socks must be worn every day. They can help with symptoms and lower the chances of the problem getting worse, but they do not cure the problem.  Only take over-the-counter or prescription medicines for pain, discomfort, or fever as directed by your caregiver.  Your caregiver will review your other  medications with you. SEEK MEDICAL CARE IF:   You are confused about how to take your medications.  There is redness, swelling, or increasing pain in the affected area.  There is a red streak or line that extends up or down from the affected area.  There is a breakdown or loss of skin in the affected area, even if the breakdown is small.  You develop an unexplained oral temperature above 102 F (38.9 C).  There is an injury to the affected area. SEEK IMMEDIATE MEDICAL CARE IF:   There is an injury and open wound to the affected area.  Pain is not adequately relieved with pain medication prescribed or becomes severe.  An oral temperature above 102 F (38.9 C) develops.  The foot/ankle below the affected area becomes suddenly numb or the area feels weak and hard to move. MAKE SURE YOU:   Understand these instructions.  Will watch your condition.  Will get help right away if you are not doing well or get worse. Document Released: 01/14/2007 Document Revised: 12/03/2011 Document Reviewed: 03/24/2007 [employer] Patient Information 2013 [employer].  

## 2013-01-17 LAB — COMPLETE METABOLIC PANEL WITH GFR
AST: 22 U/L (ref 0–37)
Albumin: 4 g/dL (ref 3.5–5.2)
Alkaline Phosphatase: 129 U/L — ABNORMAL HIGH (ref 39–117)
BUN: 11 mg/dL (ref 6–23)
GFR, Est Non African American: 58 mL/min — ABNORMAL LOW
Glucose, Bld: 82 mg/dL (ref 70–99)
Potassium: 4.3 mEq/L (ref 3.5–5.3)
Total Bilirubin: 0.4 mg/dL (ref 0.3–1.2)

## 2013-01-18 ENCOUNTER — Emergency Department (HOSPITAL_COMMUNITY)
Admission: EM | Admit: 2013-01-18 | Discharge: 2013-01-19 | Disposition: A | Discharge status: Home / Self Care | Payer: Medicare Other | Attending: Emergency Medicine | Admitting: Emergency Medicine

## 2013-01-18 ENCOUNTER — Encounter (HOSPITAL_COMMUNITY): Payer: Self-pay | Admitting: *Deleted

## 2013-01-18 DIAGNOSIS — F3289 Other specified depressive episodes: Secondary | ICD-10-CM | POA: Insufficient documentation

## 2013-01-18 DIAGNOSIS — Z86718 Personal history of other venous thrombosis and embolism: Secondary | ICD-10-CM | POA: Insufficient documentation

## 2013-01-18 DIAGNOSIS — Z8719 Personal history of other diseases of the digestive system: Secondary | ICD-10-CM | POA: Insufficient documentation

## 2013-01-18 DIAGNOSIS — M1990 Unspecified osteoarthritis, unspecified site: Secondary | ICD-10-CM | POA: Insufficient documentation

## 2013-01-18 DIAGNOSIS — J449 Chronic obstructive pulmonary disease, unspecified: Secondary | ICD-10-CM | POA: Insufficient documentation

## 2013-01-18 DIAGNOSIS — Z7901 Long term (current) use of anticoagulants: Secondary | ICD-10-CM | POA: Insufficient documentation

## 2013-01-18 DIAGNOSIS — K219 Gastro-esophageal reflux disease without esophagitis: Secondary | ICD-10-CM | POA: Insufficient documentation

## 2013-01-18 DIAGNOSIS — Z79899 Other long term (current) drug therapy: Secondary | ICD-10-CM | POA: Insufficient documentation

## 2013-01-18 DIAGNOSIS — R600 Localized edema: Secondary | ICD-10-CM

## 2013-01-18 DIAGNOSIS — J4489 Other specified chronic obstructive pulmonary disease: Secondary | ICD-10-CM | POA: Insufficient documentation

## 2013-01-18 DIAGNOSIS — F329 Major depressive disorder, single episode, unspecified: Secondary | ICD-10-CM | POA: Insufficient documentation

## 2013-01-18 DIAGNOSIS — R609 Edema, unspecified: Secondary | ICD-10-CM | POA: Insufficient documentation

## 2013-01-18 LAB — BASIC METABOLIC PANEL
BUN: 11 mg/dL (ref 6–23)
CO2: 29 mEq/L (ref 19–32)
Calcium: 8.8 mg/dL (ref 8.4–10.5)
Chloride: 101 mEq/L (ref 96–112)
Creatinine, Ser: 1.06 mg/dL (ref 0.50–1.10)
Glucose, Bld: 99 mg/dL (ref 70–99)

## 2013-01-18 LAB — CBC WITH DIFFERENTIAL/PLATELET
Basophils Absolute: 0 10*3/uL (ref 0.0–0.1)
Eosinophils Relative: 4 % (ref 0–5)
HCT: 33.2 % — ABNORMAL LOW (ref 36.0–46.0)
Hemoglobin: 11.2 g/dL — ABNORMAL LOW (ref 12.0–15.0)
Lymphocytes Relative: 19 % (ref 12–46)
Lymphs Abs: 1.5 10*3/uL (ref 0.7–4.0)
MCV: 89.7 fL (ref 78.0–100.0)
Monocytes Absolute: 1.3 10*3/uL — ABNORMAL HIGH (ref 0.1–1.0)
Monocytes Relative: 17 % — ABNORMAL HIGH (ref 3–12)
Neutro Abs: 4.8 10*3/uL (ref 1.7–7.7)
RBC: 3.7 MIL/uL — ABNORMAL LOW (ref 3.87–5.11)
WBC: 7.9 10*3/uL (ref 4.0–10.5)

## 2013-01-18 LAB — PROTIME-INR: Prothrombin Time: 27.8 seconds — ABNORMAL HIGH (ref 11.6–15.2)

## 2013-01-18 MED ORDER — POTASSIUM CHLORIDE CRYS ER 20 MEQ PO TBCR
40.0000 meq | EXTENDED_RELEASE_TABLET | Freq: Once | ORAL | Status: AC
  Administered 2013-01-18: 40 meq via ORAL
  Filled 2013-01-18: qty 2

## 2013-01-18 MED ORDER — OXYCODONE-ACETAMINOPHEN 5-325 MG PO TABS
2.0000 | ORAL_TABLET | Freq: Once | ORAL | Status: AC
  Administered 2013-01-18: 2 via ORAL
  Filled 2013-01-18: qty 2

## 2013-01-18 NOTE — ED Notes (Signed)
Pt able to bear weight on feet.

## 2013-01-18 NOTE — ED Notes (Signed)
ZOX:[id number]<AV> Expected date:01/18/13<BR> Expected time: 7:44 PM<BR> Means of arrival:Ambulance<BR> Comments:<BR> 79 yo F  Lower extremity swelling

## 2013-01-18 NOTE — ED Notes (Signed)
Per EMS report: pt from home: Pt c/o of pain and bilateral lew swelling x 1 week.  Pt hx of clots in her legs.  Pt rating pain a 9/10.  Pt is hard to understand d/t no teeth. Legs dusky color.  Pt has no complaint.  Pt reports currently taking lasix. EMS VS: BP: 136/80, HR: 78, RR: 20, 94%RA.  Pt a/o x 4.

## 2013-01-18 NOTE — ED Provider Notes (Signed)
History     CSN: [id number]  Arrival date & time 01/18/13  1958   First MD Initiated Contact with Patient 01/18/13 2041      Chief Complaint  Patient presents with  . Leg Swelling   HPI  History provided by the patient and previous medical charts. Patient is 79 year old female with history of hypertension, COPD, previous DVTs on Coumadin who presents with complaints of increased bilateral lower extremity swelling and pain. Patient has history of lower extremity swelling and pain. She is followed by this by her PCP as well as a wound clinic. Patient states swelling has increased gradually over the past one week. She was seen 2 weeks ago and had her Lasix increased to 80 mg a day. She reports taking this as prescribed. At that time patient was also given a prescription for Cipro for concerns of possible infection. Patient states she was unable to get this at the pharmacy yet. Denies any other changes in her medications. Patient also denies any changes in her diet. She denies having any shortness of breath or chest discomforts. Patient also states color of skin is at baseline which is normally dark red and sometimes black in appearance. She denies having any weakness or numbness to the foot or toes. Denies having any fever, chills or sweats. No other aggravating or alleviating factors. No other associated symptoms.    Past Medical History  Diagnosis Date  . DVT (deep venous thrombosis)   . Hypertension   . COPD (chronic obstructive pulmonary disease)   . GERD (gastroesophageal reflux disease)   . Osteoarthritis   . Ischemic colitis   . Depression   . Gastric erosion     Past Surgical History  Procedure Laterality Date  . Total hip arthroplasty      bilateral  . Knee arthroplasty      bilateral  . Esophagogastroduodenoscopy  10/17/2007    erosion   . Colonoscopy    . Back surgery    . Abdominal hysterectomy    . Cholecystectomy    . Cataracts      Family History  Problem  Relation Age of Onset  . Rheum arthritis Mother   . Ulcers Father     History  Substance Use Topics  . Smoking status: Never Smoker   . Smokeless tobacco: Never Used  . Alcohol Use: No    OB History   Grav Para Term Preterm Abortions TAB SAB Ect Mult Living                  Review of Systems  Constitutional: Negative for fever, chills and diaphoresis.  Respiratory: Negative for cough and shortness of breath.   Cardiovascular: Positive for leg swelling. Negative for chest pain.  All other systems reviewed and are negative.    Allergies  Iohexol; Latex; Penicillins; and Shellfish allergy  Home Medications   Current Outpatient Rx  Name  Route  Sig  Dispense  Refill  . amitriptyline (ELAVIL) 75 MG tablet   Oral   Take 225 mg by mouth at bedtime.         Marland Kitchen amLODipine (NORVASC) 2.5 MG tablet   Oral   Take 2.5 mg by mouth every morning.          . Cholecalciferol (VITAMIN D3) 5000 UNITS CHEW   Oral   Chew 2 tablets by mouth every morning.         . ciprofloxacin (CIPRO) 500 MG tablet   Oral   Take  1 tablet (500 mg total) by mouth 2 (two) times daily.   20 tablet   0   . citalopram (CELEXA) 20 MG tablet   Oral   Take 20 mg by mouth every morning.         . donepezil (ARICEPT) 10 MG tablet   Oral   Take 10 mg by mouth at bedtime.         . furosemide (LASIX) 40 MG tablet   Oral   Take 1 tablet (40 mg total) by mouth 2 (two) times daily.   60 tablet   3   . gabapentin (NEURONTIN) 300 MG capsule   Oral   Take 300 mg by mouth 2 (two) times daily. For pain         . HYDROcodone-acetaminophen (NORCO) 10-325 MG per tablet   Oral   Take 2 tablets by mouth every 4 (four) hours as needed. For pain         . hydrOXYzine (ATARAX/VISTARIL) 25 MG tablet   Oral   Take 1 tablet (25 mg total) by mouth 3 (three) times daily as needed for itching or anxiety.   30 tablet   0     NTBS   . omeprazole (PRILOSEC) 20 MG capsule   Oral   Take 20 mg by  mouth daily as needed (for indigestion and acid reflux). Acid reflux         . rOPINIRole (REQUIP) 1 MG tablet   Oral   Take 1-2 tablets (1-2 mg total) by mouth at bedtime. Take 1 to 2 tablets by mouth at bedtime for restless legs.Take 1-2 mg by mouth at bedtime as needed (for restless legs   60 tablet   0     ntbs before next refill   . warfarin (COUMADIN) 5 MG tablet   Oral   Take 5 mg by mouth daily.           BP 138/59  Pulse 83  Temp(Src) 98.2 F (36.8 C) (Oral)  Resp 20  Ht 4\' 11"  (1.499 m)  Wt 205 lb 8 oz (93.214 kg)  BMI 41.48 kg/m2  SpO2 93%  Physical Exam  Nursing note and vitals reviewed. Constitutional: She is oriented to person, place, and time. She appears well-developed and well-nourished. No distress.  HENT:  Head: Normocephalic.  Neck: Normal range of motion. No JVD present.  Cardiovascular: Normal rate and regular rhythm.   Pulmonary/Chest: Effort normal and breath sounds normal. No respiratory distress. She has no wheezes. She has no rales.  Abdominal: Soft.  Musculoskeletal: She exhibits edema.  Significant pitting edema to bilateral lower extremities. There is hyperpigmentation with erythema this and bronze appearance beginning at the ankles up to the lower legs. Temperature is cool and equal bilaterally. They're normal dorsal pedal pulses bilaterally. Normal and equal sensations and normal cap refill less than 2 seconds.  Neurological: She is alert and oriented to person, place, and time.  Skin: Skin is warm and dry. No rash noted.  Psychiatric: She has a normal mood and affect. Her behavior is normal.    ED Course  Procedures   Results for orders placed during the hospital encounter of 01/18/13  CBC WITH DIFFERENTIAL      Result Value Range   WBC 7.9  4.0 - 10.5 K/uL   RBC 3.70 (*) 3.87 - 5.11 MIL/uL   Hemoglobin 11.2 (*) 12.0 - 15.0 g/dL   HCT 16.1 (*) 09.6 - 04.5 %   MCV 89.7  78.0 - 100.0 fL   MCH 30.3  26.0 - 34.0 pg   MCHC 33.7   30.0 - 36.0 g/dL   RDW 16.1  09.6 - 04.5 %   Platelets 239  150 - 400 K/uL   Neutrophils Relative 61  43 - 77 %   Neutro Abs 4.8  1.7 - 7.7 K/uL   Lymphocytes Relative 19  12 - 46 %   Lymphs Abs 1.5  0.7 - 4.0 K/uL   Monocytes Relative 17 (*) 3 - 12 %   Monocytes Absolute 1.3 (*) 0.1 - 1.0 K/uL   Eosinophils Relative 4  0 - 5 %   Eosinophils Absolute 0.3  0.0 - 0.7 K/uL   Basophils Relative 0  0 - 1 %   Basophils Absolute 0.0  0.0 - 0.1 K/uL  BASIC METABOLIC PANEL      Result Value Range   Sodium 139  135 - 145 mEq/L   Potassium 3.1 (*) 3.5 - 5.1 mEq/L   Chloride 101  96 - 112 mEq/L   CO2 29  19 - 32 mEq/L   Glucose, Bld 99  70 - 99 mg/dL   BUN 11  6 - 23 mg/dL   Creatinine, Ser 4.09  0.50 - 1.10 mg/dL   Calcium 8.8  8.4 - 81.1 mg/dL   GFR calc non Af Amer 47 (*) >90 mL/min   GFR calc Af Amer 54 (*) >90 mL/min  PROTIME-INR      Result Value Range   Prothrombin Time 27.8 (*) 11.6 - 15.2 seconds   INR 2.76 (*) 0.00 - 1.49  URINALYSIS, ROUTINE W REFLEX MICROSCOPIC      Result Value Range   Color, Urine YELLOW  YELLOW   APPearance CLEAR  CLEAR   Specific Gravity, Urine 1.012  1.005 - 1.030   pH 6.5  5.0 - 8.0   Glucose, UA NEGATIVE  NEGATIVE mg/dL   Hgb urine dipstick SMALL (*) NEGATIVE   Bilirubin Urine NEGATIVE  NEGATIVE   Ketones, ur NEGATIVE  NEGATIVE mg/dL   Protein, ur NEGATIVE  NEGATIVE mg/dL   Urobilinogen, UA 0.2  0.0 - 1.0 mg/dL   Nitrite NEGATIVE  NEGATIVE   Leukocytes, UA TRACE (*) NEGATIVE  URINE MICROSCOPIC-ADD ON      Result Value Range   Squamous Epithelial / LPF RARE  RARE   WBC, UA 0-2  <3 WBC/hpf   RBC / HPF 0-2  <3 RBC/hpf   Bacteria, UA FEW (*) RARE   Casts HYALINE CASTS (*) NEGATIVE       1. Bilateral lower extremity edema       MDM  Patient seen and evaluated. Patient resting comfortably in no acute distress. Patient does have significant bilateral  Labs unremarkable. Patient was also seen and evaluated with attending physician. At  this time it is felt patient may return home and followup with her specialists and primary care provider.      Angus Seller, PA-C 01/19/13 [phone]

## 2013-01-19 LAB — URINE MICROSCOPIC-ADD ON

## 2013-01-19 LAB — URINALYSIS, ROUTINE W REFLEX MICROSCOPIC
Specific Gravity, Urine: 1.012 (ref 1.005–1.030)
Urobilinogen, UA: 0.2 mg/dL (ref 0.0–1.0)
pH: 6.5 (ref 5.0–8.0)

## 2013-01-19 NOTE — Telephone Encounter (Signed)
LAST OV 11/13 WITH DFS

## 2013-01-19 NOTE — ED Provider Notes (Signed)
[name] Walker is a 79 y.o. female who has ongoing lower extremity edema and redness. She is here tonight to get it checked out. She was prescribed Cipro but did not start taking it.  She sees wound care weekly for wraps. Currently, she does not have any wraps on the leg.  Exam: Alert, calm, cooperative. Her surgeries are edematous with mild erythema, but no significant fluctuance, discharge or petechiae   Assessment: Chronic lower extremity edema with secondary inflammation. Doubt DVT, cellulitis, assess disease. She is stable for discharge    Medical screening examination/treatment/procedure(s) were conducted as a shared visit with non-physician practitioner(s) and myself.  I personally evaluated the patient during the encounter  Flint Melter, MD 01/21/13 [phone]

## 2013-01-19 NOTE — Telephone Encounter (Signed)
LAST OV 11/13. 

## 2013-01-20 ENCOUNTER — Telehealth: Payer: Self-pay | Admitting: *Deleted

## 2013-01-20 NOTE — Telephone Encounter (Signed)
Called Patient and notified her that INR was normal at 2.5 and to continue taking 5mg  qd, she has not had any changes in her medications or problems with bleeding.  Re-check INR with home health in 1 week.  [name] Walker

## 2013-01-20 NOTE — Telephone Encounter (Signed)
PT is 29.5 and INR is 2.5. Patient takes 5mg  qd.

## 2013-01-26 ENCOUNTER — Other Ambulatory Visit: Payer: Self-pay

## 2013-01-26 MED ORDER — TRAMADOL HCL 50 MG PO TABS: 50.0000 mg | ORAL_TABLET | Freq: Two times a day (BID) | ORAL | Status: DC

## 2013-01-26 MED ORDER — VITAMIN D3 125 MCG (5000 UT) PO CHEW: 2.0000 | CHEWABLE_TABLET | Freq: Every morning | ORAL | Status: DC

## 2013-01-26 MED ORDER — GABAPENTIN 300 MG PO CAPS: 300.0000 mg | ORAL_CAPSULE | Freq: Two times a day (BID) | ORAL | Status: DC

## 2013-01-26 NOTE — Telephone Encounter (Signed)
Last seen 01/16/13

## 2013-01-28 ENCOUNTER — Other Ambulatory Visit (INDEPENDENT_AMBULATORY_CARE_PROVIDER_SITE_OTHER): Payer: Medicare Other

## 2013-01-28 ENCOUNTER — Telehealth: Payer: Self-pay | Admitting: *Deleted

## 2013-01-28 ENCOUNTER — Ambulatory Visit (INDEPENDENT_AMBULATORY_CARE_PROVIDER_SITE_OTHER): Payer: Medicare Other | Admitting: Pharmacist

## 2013-01-28 DIAGNOSIS — I10 Essential (primary) hypertension: Secondary | ICD-10-CM

## 2013-01-28 LAB — BASIC METABOLIC PANEL WITH GFR
BUN: 15 mg/dL (ref 6–23)
Creat: 0.85 mg/dL (ref 0.50–1.10)
GFR, Est African American: 72 mL/min
GFR, Est Non African American: 63 mL/min
Glucose, Bld: 103 mg/dL — ABNORMAL HIGH (ref 70–99)

## 2013-01-28 NOTE — Telephone Encounter (Signed)
PT 37.1, INR 3.1 today, on 5mg  daily

## 2013-01-28 NOTE — Telephone Encounter (Signed)
Hold warfarin for 1 day, then restart warfarin 5mg  daily Recheck protime in 2 weeks.  Tina with [facility] notified as well.

## 2013-01-29 ENCOUNTER — Telehealth: Payer: Self-pay | Admitting: *Deleted

## 2013-01-29 NOTE — Telephone Encounter (Signed)
[facility], called and said pt needs her lasix, she lost her bottle and has edema.  As I was looking at her chart on epic, I saw where the patient was in ER on 01/18/13, and received a new order for potassium, cause it was low. Pt says she never got potassium filled. I dont know what to do, I guess she needs lasix and potassium filled?

## 2013-01-30 ENCOUNTER — Other Ambulatory Visit: Payer: Self-pay | Admitting: Nurse Practitioner

## 2013-02-02 NOTE — Telephone Encounter (Signed)
For what?

## 2013-02-02 NOTE — Telephone Encounter (Signed)
Patient requesting refill for cipro. Please advise

## 2013-02-02 NOTE — Telephone Encounter (Signed)
For what - do not normally do refills on antibiotics

## 2013-02-03 NOTE — Telephone Encounter (Signed)
Found lasix, still need potassium called in?

## 2013-02-04 MED ORDER — POTASSIUM CHLORIDE ER 10 MEQ PO TBCR: 10.0000 meq | EXTENDED_RELEASE_TABLET | Freq: Two times a day (BID) | ORAL | Status: DC

## 2013-02-04 NOTE — Telephone Encounter (Signed)
3.6 k+ was just drawn on 01/28/13 By home health- tina

## 2013-02-04 NOTE — Telephone Encounter (Signed)
TINA AWARE THAT RX k+ WAS SENT TO [facility]

## 2013-02-04 NOTE — Telephone Encounter (Signed)
Needs labs done first

## 2013-02-04 NOTE — Telephone Encounter (Signed)
Potassium not on his list-has patient taken before

## 2013-02-04 NOTE — Telephone Encounter (Signed)
K rx sent to hospital

## 2013-02-04 NOTE — Telephone Encounter (Signed)
Never been on potassium in past, last checked was 3.6 , time before 3.1. And now on lasix 80 qd. Please address

## 2013-02-09 ENCOUNTER — Other Ambulatory Visit: Payer: Self-pay | Admitting: Nurse Practitioner

## 2013-02-10 NOTE — Telephone Encounter (Signed)
For what we do not ususally do refills on cipro

## 2013-02-10 NOTE — Telephone Encounter (Signed)
Last seen 01/16/13

## 2013-02-12 ENCOUNTER — Telehealth: Payer: Self-pay | Admitting: *Deleted

## 2013-02-12 NOTE — Telephone Encounter (Signed)
Hold coumadin for 1 days then Half of tablet tomorrow then  continue current dose. Recheck in 1 week

## 2013-02-12 NOTE — Telephone Encounter (Signed)
PT 49.2 INR 4.0 TAKING 5MG  OF COUMADIN DAILY

## 2013-02-12 NOTE — Telephone Encounter (Signed)
[facility] at [facility] aware

## 2013-02-17 ENCOUNTER — Telehealth: Payer: Self-pay | Admitting: *Deleted

## 2013-02-17 ENCOUNTER — Ambulatory Visit: Payer: Self-pay | Admitting: Pharmacist

## 2013-02-17 NOTE — Telephone Encounter (Signed)
PT 25.1, INR 2.1  Takes 5 mg qd

## 2013-02-17 NOTE — Progress Notes (Signed)
Patient instructed to continue current warfarin dose of 5mg  daily.  Today was [facility] last visit to patient's home.  Appt set up for 03/05/13 for recheck protime in our office.  Patient aware of appt.

## 2013-02-17 NOTE — Telephone Encounter (Signed)
INR reviewed and Anticoag encounter created - see encounters Patient and [facility] notified.

## 2013-02-20 ENCOUNTER — Other Ambulatory Visit: Payer: Self-pay

## 2013-02-20 MED ORDER — AMITRIPTYLINE HCL 75 MG PO TABS: 225.0000 mg | ORAL_TABLET | Freq: Every day | ORAL | Status: DC

## 2013-02-21 ENCOUNTER — Other Ambulatory Visit: Payer: Self-pay | Admitting: Nurse Practitioner

## 2013-03-04 ENCOUNTER — Ambulatory Visit: Payer: Self-pay | Admitting: Nurse Practitioner

## 2013-03-18 ENCOUNTER — Other Ambulatory Visit: Payer: Self-pay | Admitting: Nurse Practitioner

## 2013-03-23 ENCOUNTER — Telehealth: Payer: Self-pay | Admitting: Nurse Practitioner

## 2013-03-23 NOTE — Telephone Encounter (Signed)
Need to know what kind of wheel chair needed

## 2013-03-23 NOTE — Telephone Encounter (Signed)
Advise

## 2013-03-24 ENCOUNTER — Other Ambulatory Visit: Payer: Self-pay | Admitting: Nurse Practitioner

## 2013-03-24 NOTE — Telephone Encounter (Signed)
Last seen 01/16/13    MMM

## 2013-03-26 ENCOUNTER — Other Ambulatory Visit: Payer: Self-pay | Admitting: *Deleted

## 2013-03-26 MED ORDER — WARFARIN SODIUM 5 MG PO TABS: 5.0000 mg | ORAL_TABLET | Freq: Every day | ORAL | Status: DC

## 2013-04-02 NOTE — Telephone Encounter (Signed)
Patient states she was contacted by [facility] and they are wanting to get her a new motorized chair. She has one that is 79 years old from the scooter store but says they went out of business. Can you please call [employer] at [phone] and see what exactly they need

## 2013-04-03 NOTE — Telephone Encounter (Signed)
Called pt, she said she had appt here next tues for [employer] forms, she does not have appt, I called [employer], they said the appt was with [facility] and not this office. They will contact her.

## 2013-04-08 ENCOUNTER — Other Ambulatory Visit: Payer: Self-pay | Admitting: Family Medicine

## 2013-04-08 ENCOUNTER — Other Ambulatory Visit: Payer: Self-pay | Admitting: Nurse Practitioner

## 2013-04-16 ENCOUNTER — Other Ambulatory Visit: Payer: Self-pay | Admitting: Nurse Practitioner

## 2013-04-24 ENCOUNTER — Other Ambulatory Visit: Payer: Self-pay | Admitting: Family Medicine

## 2013-04-24 NOTE — Telephone Encounter (Signed)
Seen 01/16/13  MMM

## 2013-04-27 ENCOUNTER — Other Ambulatory Visit: Payer: Self-pay | Admitting: Family Medicine

## 2013-04-30 ENCOUNTER — Other Ambulatory Visit: Payer: Self-pay | Admitting: Nurse Practitioner

## 2013-05-01 NOTE — Telephone Encounter (Signed)
Last filled 01/26/13, last seen 01/16/13, call into [facility]

## 2013-05-06 ENCOUNTER — Other Ambulatory Visit: Payer: Self-pay | Admitting: Nurse Practitioner

## 2013-05-07 ENCOUNTER — Other Ambulatory Visit: Payer: Self-pay | Admitting: Nurse Practitioner

## 2013-05-07 NOTE — Telephone Encounter (Signed)
Rx called into [facility] 

## 2013-05-27 ENCOUNTER — Telehealth: Payer: Self-pay | Admitting: Nurse Practitioner

## 2013-05-27 ENCOUNTER — Ambulatory Visit (INDEPENDENT_AMBULATORY_CARE_PROVIDER_SITE_OTHER): Payer: Medicare Other | Admitting: General Practice

## 2013-05-27 VITALS — BP 100/48 | HR 75 | Temp 98.0°F | Ht 59.0 in | Wt 198.0 lb

## 2013-05-27 DIAGNOSIS — K1379 Other lesions of oral mucosa: Secondary | ICD-10-CM

## 2013-05-27 DIAGNOSIS — J029 Acute pharyngitis, unspecified: Secondary | ICD-10-CM

## 2013-05-27 DIAGNOSIS — K1370 Unspecified lesions of oral mucosa: Secondary | ICD-10-CM

## 2013-05-27 LAB — POCT RAPID STREP A (OFFICE): Rapid Strep A Screen: NEGATIVE

## 2013-05-27 MED ORDER — AZITHROMYCIN 250 MG PO TABS: ORAL_TABLET | ORAL | Status: DC

## 2013-05-27 MED ORDER — FIRST-DUKES MOUTHWASH MT SUSP: 5.0000 mL | Freq: Three times a day (TID) | OROMUCOSAL | Status: DC

## 2013-05-27 NOTE — Telephone Encounter (Signed)
Appt given for today 

## 2013-05-27 NOTE — Patient Instructions (Addendum)
Sore Throat A sore throat is pain, burning, irritation, or scratchiness of the throat. There is often pain or tenderness when swallowing or talking. A sore throat may be accompanied by other symptoms, such as coughing, sneezing, fever, and swollen neck glands. A sore throat is often the first sign of another sickness, such as a cold, flu, strep throat, or mononucleosis (commonly known as mono). Most sore throats go away without medical treatment. CAUSES  The most common causes of a sore throat include:  A viral infection, such as a cold, flu, or mono.  A bacterial infection, such as strep throat, tonsillitis, or whooping cough.  Seasonal allergies.  Dryness in the air.  Irritants, such as smoke or pollution.  Gastroesophageal reflux disease (GERD). HOME CARE INSTRUCTIONS   Only take over-the-counter medicines as directed by your caregiver.  Drink enough fluids to keep your urine clear or pale yellow.  Rest as needed.  Try using throat sprays, lozenges, or sucking on hard candy to ease any pain (if older than 4 years or as directed).  Sip warm liquids, such as broth, herbal tea, or warm water with honey to relieve pain temporarily. You may also eat or drink cold or frozen liquids such as frozen ice pops.  Gargle with salt water (mix 1 tsp salt with 8 oz of water).  Do not smoke and avoid secondhand smoke.  Put a cool-mist humidifier in your bedroom at night to moisten the air. You can also turn on a hot shower and sit in the bathroom with the door closed for 5 10 minutes. SEEK IMMEDIATE MEDICAL CARE IF:  You have difficulty breathing.  You are unable to swallow fluids, soft foods, or your saliva.  You have increased swelling in the throat.  Your sore throat does not get better in 7 days.  You have nausea and vomiting.  You have a fever or persistent symptoms for more than 2 3 days.  You have a fever and your symptoms suddenly get worse. MAKE SURE YOU:   Understand  these instructions.  Will watch your condition.  Will get help right away if you are not doing well or get worse. Document Released: 10/18/2004 Document Revised: 08/27/2012 Document Reviewed: 05/18/2012 [employer] Patient Information 2014 [employer].  

## 2013-05-27 NOTE — Progress Notes (Signed)
  Subjective:    Patient ID: [name] Walker, female    DOB: Jan 18, 1925, 79 y.o.   MRN: [id number]  Sore Throat  This is a new problem. The current episode started in the past 7 days. The problem has been gradually worsening. Neither side of throat is experiencing more pain than the other. There has been no fever. The pain is at a severity of 7/10. Pertinent negatives include no congestion, coughing, headaches, plugged ear sensation, neck pain or shortness of breath. She has had no exposure to strep. She has tried nothing for the symptoms.  Oral Pain  This is a new problem. The current episode started in the past 7 days. The problem occurs constantly. The problem has been gradually worsening. The pain is at a severity of 7/10. The pain is moderate. Pertinent negatives include no facial pain, fever, oral bleeding or sinus pressure. She has tried nothing for the symptoms.      Review of Systems  Constitutional: Negative for fever and chills.  HENT: Positive for sore throat and sneezing. Negative for congestion, rhinorrhea, neck pain, neck stiffness and sinus pressure.   Respiratory: Negative for cough, chest tightness and shortness of breath.   Cardiovascular: Negative for chest pain.  Neurological: Negative for dizziness, weakness and headaches.       Objective:   Physical Exam  Constitutional: She is oriented to person, place, and time. She appears well-developed and well-nourished.  HENT:  Head: Normocephalic and atraumatic.  Right Ear: External ear normal.  Left Ear: External ear normal.  Mouth/Throat: Posterior oropharyngeal erythema present.  Erythema noted to bilateral inner cheeks  Cardiovascular: Normal rate, regular rhythm and normal heart sounds.   Pulmonary/Chest: Effort normal. No respiratory distress. She has decreased breath sounds in the right lower field and the left lower field. She has no wheezes. She exhibits no tenderness.  Neurological: She is alert and oriented  to person, place, and time.  Skin: Skin is warm and dry. No rash noted.  Psychiatric: She has a normal mood and affect.          Assessment & Plan:  1. Sore throat - POCT rapid strep A - azithromycin (ZITHROMAX) 250 MG tablet; Take as directed  Dispense: 6 tablet; Refill: 0  2. Oral pain - Diphenhyd-Hydrocort-Nystatin (FIRST-DUKES MOUTHWASH) SUSP; Use as directed 5 mLs in the mouth or throat 3 (three) times daily.  Dispense: 1 Bottle; Refill: 0 -RTO if symptoms worsen or unresolved and on Monday Sept 8 for INR -Patient verbalized understanding -Coralie Keens, FNP-C

## 2013-05-29 ENCOUNTER — Encounter (HOSPITAL_COMMUNITY): Payer: Self-pay | Admitting: *Deleted

## 2013-05-29 ENCOUNTER — Emergency Department (HOSPITAL_COMMUNITY)
Admission: EM | Admit: 2013-05-29 | Discharge: 2013-05-29 | Disposition: A | Discharge status: Home / Self Care | Payer: Medicare Other | Attending: Emergency Medicine | Admitting: Emergency Medicine

## 2013-05-29 DIAGNOSIS — S139XXA Sprain of joints and ligaments of unspecified parts of neck, initial encounter: Secondary | ICD-10-CM

## 2013-05-29 DIAGNOSIS — Y939 Activity, unspecified: Secondary | ICD-10-CM | POA: Insufficient documentation

## 2013-05-29 DIAGNOSIS — M1990 Unspecified osteoarthritis, unspecified site: Secondary | ICD-10-CM | POA: Insufficient documentation

## 2013-05-29 DIAGNOSIS — Z7901 Long term (current) use of anticoagulants: Secondary | ICD-10-CM | POA: Insufficient documentation

## 2013-05-29 DIAGNOSIS — Z91040 Latex allergy status: Secondary | ICD-10-CM | POA: Insufficient documentation

## 2013-05-29 DIAGNOSIS — I10 Essential (primary) hypertension: Secondary | ICD-10-CM | POA: Insufficient documentation

## 2013-05-29 DIAGNOSIS — F329 Major depressive disorder, single episode, unspecified: Secondary | ICD-10-CM | POA: Insufficient documentation

## 2013-05-29 DIAGNOSIS — Z79899 Other long term (current) drug therapy: Secondary | ICD-10-CM | POA: Insufficient documentation

## 2013-05-29 DIAGNOSIS — J449 Chronic obstructive pulmonary disease, unspecified: Secondary | ICD-10-CM | POA: Insufficient documentation

## 2013-05-29 DIAGNOSIS — Y92009 Unspecified place in unspecified non-institutional (private) residence as the place of occurrence of the external cause: Secondary | ICD-10-CM | POA: Insufficient documentation

## 2013-05-29 DIAGNOSIS — Z86718 Personal history of other venous thrombosis and embolism: Secondary | ICD-10-CM | POA: Insufficient documentation

## 2013-05-29 DIAGNOSIS — Z880 Allergy status to penicillin: Secondary | ICD-10-CM | POA: Insufficient documentation

## 2013-05-29 DIAGNOSIS — K219 Gastro-esophageal reflux disease without esophagitis: Secondary | ICD-10-CM | POA: Insufficient documentation

## 2013-05-29 DIAGNOSIS — J4489 Other specified chronic obstructive pulmonary disease: Secondary | ICD-10-CM | POA: Insufficient documentation

## 2013-05-29 DIAGNOSIS — R296 Repeated falls: Secondary | ICD-10-CM | POA: Insufficient documentation

## 2013-05-29 DIAGNOSIS — Z792 Long term (current) use of antibiotics: Secondary | ICD-10-CM | POA: Insufficient documentation

## 2013-05-29 DIAGNOSIS — F3289 Other specified depressive episodes: Secondary | ICD-10-CM | POA: Insufficient documentation

## 2013-05-29 MED ORDER — DIAZEPAM 5 MG PO TABS
5.0000 mg | ORAL_TABLET | Freq: Once | ORAL | Status: AC
  Administered 2013-05-29: 5 mg via ORAL
  Filled 2013-05-29: qty 1

## 2013-05-29 MED ORDER — OXYCODONE-ACETAMINOPHEN 5-325 MG PO TABS: 1.0000 | ORAL_TABLET | ORAL | Status: DC | PRN

## 2013-05-29 MED ORDER — MORPHINE SULFATE 4 MG/ML IJ SOLN
4.0000 mg | Freq: Once | INTRAMUSCULAR | Status: AC
  Administered 2013-05-29: 4 mg via INTRAMUSCULAR
  Filled 2013-05-29: qty 1

## 2013-05-29 MED ORDER — OXYCODONE-ACETAMINOPHEN 5-325 MG PO TABS
1.0000 | ORAL_TABLET | Freq: Once | ORAL | Status: AC
  Administered 2013-05-29: 1 via ORAL
  Filled 2013-05-29: qty 1

## 2013-05-29 NOTE — ED Notes (Signed)
Bed: RESA Expected date:  Expected time:  Means of arrival:  Comments: 79yo-fall-neck/shoulder bruising

## 2013-05-29 NOTE — ED Notes (Addendum)
Per ems pt is from home. Pt fell last night, backing up to her chair to sit down in it. Got help to get up into the chair. Denies LOC. Stayed in recliner all night.neice gave in and changed her. C/o neck pain, slept with her chin to chest, that is how pt wants to stay in that position. Reports pain with neck movement. Pt has hx of arthritis.   Upon rn assessment pt has no bruising noted to back or neck. Reports pain 10/10. Alert and oriented. Denies hitting head during fall or LOC.

## 2013-05-29 NOTE — ED Provider Notes (Signed)
CSN: [id number]     Arrival date & time 05/29/13  4098 History   First MD Initiated Contact with Patient 05/29/13 1030     Chief Complaint  Patient presents with  . Fall  . Neck Pain   (Consider location/radiation/quality/duration/timing/severity/associated sxs/prior Treatment) Patient is a 79 y.o. female presenting with fall and neck pain. The history is provided by the patient.  Fall  Neck Pain  patient here complaining of bilateral neck pain that began this morning after she struck the chair all night. Patient states that she slid out of bed yesterday onto her back. Denies any current back pain. No lower extremity numbness or tingling. States that she then slept in a chair all night when she awoke she had sharp bilateral neck pain worse with movement. Took her home meds without relief. Denies any upper extremity numbness or tingling. Called EMS and was transported here  Past Medical History  Diagnosis Date  . DVT (deep venous thrombosis)   . Hypertension   . COPD (chronic obstructive pulmonary disease)   . GERD (gastroesophageal reflux disease)   . Osteoarthritis   . Ischemic colitis   . Depression   . Gastric erosion    Past Surgical History  Procedure Laterality Date  . Total hip arthroplasty      bilateral  . Knee arthroplasty      bilateral  . Esophagogastroduodenoscopy  10/17/2007    erosion   . Colonoscopy    . Back surgery    . Abdominal hysterectomy    . Cholecystectomy    . Cataracts     Family History  Problem Relation Age of Onset  . Rheum arthritis Mother   . Ulcers Father    History  Substance Use Topics  . Smoking status: Never Smoker   . Smokeless tobacco: Never Used  . Alcohol Use: No   OB History   Grav Para Term Preterm Abortions TAB SAB Ect Mult Living                 Review of Systems  HENT: Positive for neck pain.   All other systems reviewed and are negative.    Allergies  Iohexol; Latex; Penicillins; and Shellfish allergy  Home  Medications   Current Outpatient Rx  Name  Route  Sig  Dispense  Refill  . amitriptyline (ELAVIL) 75 MG tablet      TAKE 3 TABLETS AT BEDTIME   90 tablet   0   . amLODipine (NORVASC) 2.5 MG tablet   Oral   Take 2.5 mg by mouth every morning.          Marland Kitchen azithromycin (ZITHROMAX) 250 MG tablet   Oral   Take 250 mg by mouth daily. Take 2 tablets on 05-27-2013 then 1 tablet daily for 4 days         . Cholecalciferol (VITAMIN D3) 5000 UNITS CHEW   Oral   Chew 2 tablets by mouth every morning.   60 tablet   3   . citalopram (CELEXA) 20 MG tablet      TAKE 1 TABLET DAILY   30 tablet   0   . Diphenhyd-Hydrocort-Nystatin (FIRST-DUKES MOUTHWASH) SUSP   Mouth/Throat   Use as directed 5 mLs in the mouth or throat 3 (three) times daily.   1 Bottle   0   . donepezil (ARICEPT) 10 MG tablet   Oral   Take 10 mg by mouth at bedtime.         Marland Kitchen  furosemide (LASIX) 40 MG tablet   Oral   Take 1 tablet (40 mg total) by mouth 2 (two) times daily.   60 tablet   3   . gabapentin (NEURONTIN) 300 MG capsule   Oral   Take 1 capsule (300 mg total) by mouth 2 (two) times daily. For pain   60 capsule   3   . HYDROcodone-acetaminophen (NORCO) 10-325 MG per tablet   Oral   Take 2 tablets by mouth every 4 (four) hours as needed. For pain         . hydrOXYzine (ATARAX/VISTARIL) 25 MG tablet      TAKE 1 TABLET THREE TIMES DAILY AS NEEDED FOR ITCHING OR ANXIETY   30 tablet   0   . omeprazole (PRILOSEC) 20 MG capsule   Oral   Take 20 mg by mouth daily as needed (for indigestion and acid reflux). Acid reflux         . potassium chloride (KLOR-CON 10) 10 MEQ tablet   Oral   Take 1 tablet (10 mEq total) by mouth 2 (two) times daily.   30 tablet   3   . rOPINIRole (REQUIP) 1 MG tablet      TAKE 1 OR 2 TABLETS AT BEDTIME   60 tablet   3   . traMADol (ULTRAM) 50 MG tablet      TAKE  (1)  TABLET TWICE A DAY.   60 tablet   0   . warfarin (COUMADIN) 5 MG tablet   Oral    Take 5 mg by mouth at bedtime.          BP 176/75  Pulse 73  Temp(Src) 98.2 F (36.8 C) (Oral)  Resp 20  SpO2 90% Physical Exam  Nursing note and vitals reviewed. Constitutional: She is oriented to person, place, and time. She appears well-developed and well-nourished.  Non-toxic appearance. No distress.  HENT:  Head: Normocephalic and atraumatic.  Eyes: Conjunctivae, EOM and lids are normal. Pupils are equal, round, and reactive to light.  Neck: Normal range of motion. Neck supple. No tracheal deviation present. No mass present.    Cardiovascular: Normal rate, regular rhythm and normal heart sounds.  Exam reveals no gallop.   No murmur heard. Pulmonary/Chest: Effort normal and breath sounds normal. No stridor. No respiratory distress. She has no decreased breath sounds. She has no wheezes. She has no rhonchi. She has no rales.  Abdominal: Soft. Normal appearance and bowel sounds are normal. She exhibits no distension. There is no tenderness. There is no rebound and no CVA tenderness.  Musculoskeletal: Normal range of motion. She exhibits no edema and no tenderness.  Neurological: She is alert and oriented to person, place, and time. She has normal strength. No cranial nerve deficit or sensory deficit. GCS eye subscore is 4. GCS verbal subscore is 5. GCS motor subscore is 6.  Skin: Skin is warm and dry. No abrasion and no rash noted.  Psychiatric: She has a normal mood and affect. Her speech is normal and behavior is normal.    ED Course  Procedures (including critical care time) Labs Review Labs Reviewed - No data to display Imaging Review No results found.  MDM  No diagnosis found. Patient given medication for her suspected muscle strain. Will be given a soft collar. Neurological exam stable. Return precautions given    Toy Baker, MD 05/29/13 1329

## 2013-06-04 ENCOUNTER — Other Ambulatory Visit: Payer: Self-pay | Admitting: *Deleted

## 2013-06-04 MED ORDER — HYDROCODONE-ACETAMINOPHEN 10-325 MG PO TABS: ORAL_TABLET | ORAL | Status: DC

## 2013-06-04 MED ORDER — TRAMADOL HCL 50 MG PO TABS: ORAL_TABLET | ORAL | Status: DC

## 2013-06-05 ENCOUNTER — Ambulatory Visit: Payer: Self-pay | Admitting: Nurse Practitioner

## 2013-06-08 ENCOUNTER — Non-Acute Institutional Stay (SKILLED_NURSING_FACILITY): Payer: Medicare Other | Admitting: Internal Medicine

## 2013-06-08 DIAGNOSIS — R404 Transient alteration of awareness: Secondary | ICD-10-CM

## 2013-06-08 DIAGNOSIS — R269 Unspecified abnormalities of gait and mobility: Secondary | ICD-10-CM

## 2013-06-08 DIAGNOSIS — N10 Acute tubulo-interstitial nephritis: Secondary | ICD-10-CM

## 2013-06-08 NOTE — Progress Notes (Signed)
Patient ID: [name] Walker, female   DOB: March 04, 1925, 79 y.o.   MRN: [id number]  Facility; [facility] Chief complaint; admission to SNF post admit to [facility] from September 6 to September 10  History;  this is an 79 year old woman who lives in her own home and [place] apparently with extensive support from her family. She had recently been in to ER after a fall and was sent home. She returned to [facility] ER with increasing weakness and confusion. He was found to be moderately dehydrated and had evidence of UTI with urine culture ultimately grown Escherichia coli. 2 blood cultures showed no growth. CT scan of the head showed atrophy and small vessel ischemic change with an old right basal ganglion and questionable right thalamic lacunar infarct. She was given IV fluid and antibiotics. When the culture came back she was put on Rocephin for 7 days. She was also felt to have some dysphagia which was chronic care. A barium swallow did not show any evidence of aspiration however raise the possibility of esophageal problems. His was felt to require an outpatient workup.  Past Medical History  Diagnosis Date  . DVT (deep venous thrombosis)   . Hypertension   . COPD (chronic obstructive pulmonary disease)   . GERD (gastroesophageal reflux disease)   . Osteoarthritis   . Ischemic colitis   . Depression   . Gastric erosion    Past Surgical History  Procedure Laterality Date  . Total hip arthroplasty      bilateral  . Knee arthroplasty      bilateral  . Esophagogastroduodenoscopy  10/17/2007    erosion   . Colonoscopy    . Back surgery    . Abdominal hysterectomy    . Cholecystectomy    . Cataracts     Medications Lasix 40 mg by mouth daily, vitamin D 2000 and by mouth daily, Rocephin 1 g daily finished on 911, Celexa 20 mg by mouth daily, amlodipine 2.5 by mouth daily, and Metrazol 20 mg twice a day with meals, gabapentin 300 mg by mouth twice a day, ropinirole 1 mg by mouth  each bedtime, hydroxyzine 10 mg by mouth daily, Aricept 10 mg by mouth daily, Coumadin 5 mg by mouth each bedtime KCl 20 mEq by mouth daily  Social history; patient lives in [place] in her own home. She uses a walker apparently fairly reliably. She has a history of a multitude of falls for which she presses a life alert as she cannot get up on her own. With regards to IADLs it sounds as though family is in and out constantly  Review of systems Respiratory no cough no shortness of breath Cardiac no chest pain Abdomen no abdominal pain Extremities; she has chronic venous stasis and history of fever stasis-related wounds has been seen by the wound care center in [place] GU complains of urinary frequency which she thinks is due to the Lasix she takes in the morning  Physical examination Gen.; patient is in no distress. Pulse 78 respirations 20 HEENT no oral lesions essentially nodulous Respiratory clear entry bilaterally Cardiac heart sounds are normal there is no murmurs no carotid bruits Abdomen no masses no tenderness are noted no liver no spleen GU no evidence of urinary retention Extremities bilateral total knee replacement; she has significant venous stasis but currently with no edema or wounds. Neurologic; cranial nerves no overt abnormalities. Her strength is intact in the upper extremities. 4+ out of 5 hip flexor otherwise in the legs  this is normal. Her reflexes are diffusely absent toes are downgoing Gait; her gait is wide-based and very unsteady. Balance is very poor Mental status; there is significant pressure of speech. Certainly no depression. I wonder about the severity of her cognition decline although I did not really delve into this.  Impression/plan #1 pyelonephritis secondary to Escherichia coli with secondary delirium this appears to of result #2 severe gait ataxia with a history of multiple falls for. This has the appearance of a peripheral neuropathy. At this point I really  don't have a good sense of the exact history here. She denies syncope or presyncope. 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 There is obviously a question that should be considered about the risks and benefits of continued anticoagulation in the setting of an elderly person who falls. Right now I don't have a great sense of the risk-benefit issue here. I would like to have this discussion before she is discharged #4 cognitive impairment on Aricept I think this is probably mild although I be interested in the [place] #5 question restless leg syndrome #6 history of depression on Celexa I would wonder about an unrecognized bipolar is an interesting thought  The major issue here is whether this lady's fall list can be reduced by physical therapy. Have spoken to her granddaughter about clot or in the home and this could be a good chance to try and make the home a little safer in terms of her ambulate. Further I wonder whether her Lasix dose can be reduced. She does not have any edema peripherally.

## 2013-06-20 ENCOUNTER — Other Ambulatory Visit: Payer: Self-pay | Admitting: Nurse Practitioner

## 2013-06-29 ENCOUNTER — Non-Acute Institutional Stay (SKILLED_NURSING_FACILITY): Payer: Medicare Other | Admitting: Internal Medicine

## 2013-06-29 DIAGNOSIS — G609 Hereditary and idiopathic neuropathy, unspecified: Secondary | ICD-10-CM

## 2013-06-29 DIAGNOSIS — R269 Unspecified abnormalities of gait and mobility: Secondary | ICD-10-CM

## 2013-07-13 NOTE — Progress Notes (Signed)
Patient ID: [name] Walker, female   DOB: 03/05/1925, 79 y.o.   MRN: [id number]           PROGRESS NOTE  DATE:  06/29/2013  FACILITY: [facility]   LEVEL OF CARE:   SNF   Acute Visit   CHIEF COMPLAINT:  Follow-up of admission two weeks ago.    HISTORY OF PRESENT ILLNESS:  This is a 79 year-old woman who was admitted to [facility], having an E.coli UTI and falls.    She was also dehydrated.    She is on chronic Coumadin for a history of recurrent DVTs.    The patient is asking for consideration of discharge, although I have not really had any information from therapy or social work.    Her lab work from September 18th showed a normal CBC and differential and an essentially normal BMP.     REVIEW OF SYSTEMS:   CHEST/RESPIRATORY:  The patient is not complaining of shortness of breath.   CARDIAC:   No chest pain.   GI:  No nausea, vomiting, or diarrhea.    PHYSICAL EXAMINATION:   GENERAL APPEARANCE:  The patient is not in any distress.   CHEST/RESPIRATORY:  Clear air entry bilaterally.   CARDIOVASCULAR:  CARDIAC:   Heart sounds are normal.  There are no murmurs.   GASTROINTESTINAL:  ABDOMEN:   Obese, but no tenderness.   NEUROLOGICAL:    BALANCE/GAIT:  She is able to get herself out of a chair.  She has a wide-based, unsteady gait.  Nevertheless, her gait is functional and her balance really did not seem that bad.  She has a [employer] fast streamer.    ASSESSMENT/PLAN:  Gait ataxia with a suggestion of peripheral neuropathy.    She seems to be ambulating fairly well.  It is not abundantly clear to me right now that she could not go home.    History of recurrent DVTs.  On chronic Coumadin.  I feel better after seeing her walk today than I did on admission.    I will try to speak to the Social Services about discharge.    The patient has made quite an improvement in the last few weeks here.    CPT CODE: 40981

## 2013-07-15 ENCOUNTER — Non-Acute Institutional Stay (SKILLED_NURSING_FACILITY): Payer: Medicare Other | Admitting: Internal Medicine

## 2013-07-15 DIAGNOSIS — N390 Urinary tract infection, site not specified: Secondary | ICD-10-CM

## 2013-07-15 DIAGNOSIS — R531 Weakness: Secondary | ICD-10-CM

## 2013-07-15 DIAGNOSIS — I82403 Acute embolism and thrombosis of unspecified deep veins of lower extremity, bilateral: Secondary | ICD-10-CM

## 2013-07-15 DIAGNOSIS — M1990 Unspecified osteoarthritis, unspecified site: Secondary | ICD-10-CM

## 2013-07-15 DIAGNOSIS — I10 Essential (primary) hypertension: Secondary | ICD-10-CM

## 2013-07-15 DIAGNOSIS — R609 Edema, unspecified: Secondary | ICD-10-CM

## 2013-07-15 DIAGNOSIS — R5381 Other malaise: Secondary | ICD-10-CM

## 2013-07-15 DIAGNOSIS — D649 Anemia, unspecified: Secondary | ICD-10-CM

## 2013-07-15 DIAGNOSIS — I82409 Acute embolism and thrombosis of unspecified deep veins of unspecified lower extremity: Secondary | ICD-10-CM

## 2013-07-15 DIAGNOSIS — F0390 Unspecified dementia without behavioral disturbance: Secondary | ICD-10-CM

## 2013-07-15 DIAGNOSIS — G2581 Restless legs syndrome: Secondary | ICD-10-CM

## 2013-07-15 DIAGNOSIS — F329 Major depressive disorder, single episode, unspecified: Secondary | ICD-10-CM

## 2013-07-15 NOTE — Progress Notes (Signed)
Patient ID: [name] Walker, female   DOB: 02-08-1925, 79 y.o.   MRN: [id number] Facility; [facility] This is a discharge note.  Chief complaint;--discharge note   History;  this is an 79 year old woman who lives in her own home and apparently with extensive support from her family. She had recently been in to ER after a fall and was sent home. She returned to [facility] ER with increasing weakness and confusion. He was found to be moderately dehydrated and had evidence of UTI with urine culture ultimately grown Escherichia coli. 2 blood cultures showed no growth. CT scan of the head showed atrophy and small vessel ischemic change with an old right basal ganglion and questionable right thalamic lacunar infarct. She was given IV fluid and antibiotics. When the culture came back she was put on Rocephin for 7 days. She was also felt to have some dysphagia which was chronic care. A barium swallow did not show any evidence of aspiration however raise the possibility of esophageal problems. His was felt to require an outpatient workup Her stay here as been relatively unremarkable.  She has gained significant strength and very motivated to go home tomorrow.  She is ambulating with a walker.  In regards to her other issues he does not complaining of any dysuria she has been afebrile her weight has been stable.  She does have a history of DVT she is on chronic Coumadin INR on the 20th was 2.0 another INR scheduled for tomorrow this will bear followup certainly as an outpatient by her primary care provider home health She does not complaining of any dysuria shortness of breath or chest pain as she states she somewhat irritated she had to come here  in the first place.--Although it appears she did have significant weakness and this has improved with therapy here She does live by herself but she has numerous family members nearby and apparently  receives fairly extensive support from them    Past  Medical History   Diagnosis  Date   .  DVT (deep venous thrombosis)     .  Hypertension     .  COPD (chronic obstructive pulmonary disease)     .  GERD (gastroesophageal reflux disease)     .  Osteoarthritis     .  Ischemic colitis     .  Depression     .  Gastric erosion         Past Surgical History   Procedure  Laterality  Date   .  Total hip arthroplasty           bilateral   .  Knee arthroplasty           bilateral   .  Esophagogastroduodenoscopy    10/17/2007       erosion    .  Colonoscopy       .  Back surgery       .  Abdominal hysterectomy       .  Cholecystectomy       .  Cataracts          Medications--reviewed per [facility]     Social history; patient lives in [place] in her own home. She uses a walker apparently fairly reliably. She has a history of a multitude of falls for which she presses a life alert as she cannot get up on her own. With regards to IADLs it sounds as though family is in and out constantly   Review  of systems Gen. does not complaining of fever chills.  Skin-does have a history of venous stasis followed by the wound care center in [place] Respiratory no cough no shortness of breath Cardiac no chest pain Abdomen no abdominal pain nausea or vomiting Extremities; she has chronic venous stasis and history of fever stasis-related wounds has been seen by the wound care center in [place] GU does not complain of urinary frequency or dysuria this evening Muscle skeletal-does not complaining of joint pain however appears to take Vicodin one-3 times a day Neurologic history of neuropathy but does not complaining of numbness this evening Psych-some history of depression as well as dementia although dementia appears to be quite mild    Physical examination  Temperature 97.3 pulse 81 respirations 18 blood per stool and 32/80 her weight is stable at 192 3  In general this is a pleasant elderly female in no distress.  Her skin is warm and dry she does have  venous stasis changes lower legs bilaterally she also has numerous solar induced changes upper extremities as well as her upper chest and back HEENT no oral lesions essentially edentulous Respiratory clear entry bilaterally-- Cardiac heart sounds are normal there is no murmurs no carotid bruits Abdomen no masses no tenderness are noted no liver no spleen--positive bowel sounds-- Extremities bilateral total knee replacement; she has significant venous stasis but currently with no edema or wounds. Neurologic; cranial nerves no overt abnormalities. Her strength is intact in the upper extremities--she is able to ambulate it appears relatively well with her rolling walker  Mental status; there is significant pressure of speech. Her mental status appears grossly intact and she is oriented to self place date day of week month-she appears to give an accurate history  Labs.  06/30/2013.  WBC 5.7 hemoglobin 11.9 platelets 253.  Sodium 139 potassium 4.1 BUN 13 creatinine 0.72.  06/26/2013.  DVB C4 0.9 hemoglobin 10.5 platelets 91,000 although apparently this was a somewhat clumped sample.  Sodium 140 potassium 4.8 BUN 12 creatinine 0.76.  06/04/2013.  Liver function tests within normal limits except albumin of 3.4 alkaline phosphatase 222   Impression/plan #1 severe gait ataxia--weakness- with a history of multiple falls--she appears to be doing significantly better -- is very motivated to go home where apparently she has strong family support-will need followup by primary care provider-per Dr. 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.--At this point continues on Coumadin-challenging situation with a history of falls-this will bear followup by her primary care provider risk versus benefit  She will need INR drawn tomorrow -- also follow up  by home health-- primary care provider for subsequent Coumadin management as long as PCP decides to continue the Coumadin  #3 cognitive impairment on Aricept I think this is probably mild  #4 question restless leg syndrome--she is on Requip #5-history of edema she is on Lasix with potassium-Will update metabolic panel before discharge and notify primary care provider of results #6-UTI-this appears to be resolved asymptomatic completed antibiotic therapy several weeks ago-  #7 history of depression on Celexa -she does not appear overtly depressed this evening.  #8s question history of esophageal issues--esophageal spasms--per discharge summary from hospital they recommended an esophago gram-will write order for nursing staff to see if this is been ordered or for primary care followup of this  #9-anemia --  this appears to be relatively mild we'll order a CBC before discharge.  #1o- elevated alkaline phosphatase-Will update metabolic panel before discharge-notify primary care provider of results  #11-hypertension-this appears to be stable on low dose Norvasc.  #12-osteoarthritis-this appears to be stable she does take Vicodin and Ultram when necessary  Also  have written order for PCP followup arranged before discharge [facility] [place]  [phone] note greater than 30 minutes spent preparing this discharge summary   The major issue here is whether this lady's fall list can be reduced by physical therapy. Have spoken to her granddaughter about clot or in the home and this could be a good chance to try and make the home a little safer in terms of her ambulate. Further I wonder whether her Lasix dose can be reduced. She does not have any edema peripherally.

## 2013-07-20 ENCOUNTER — Other Ambulatory Visit: Payer: Self-pay

## 2013-07-20 DIAGNOSIS — S60919A Unspecified superficial injury of unspecified wrist, initial encounter: Secondary | ICD-10-CM

## 2013-07-20 DIAGNOSIS — R1310 Dysphagia, unspecified: Secondary | ICD-10-CM

## 2013-07-20 DIAGNOSIS — S50919A Unspecified superficial injury of unspecified forearm, initial encounter: Secondary | ICD-10-CM

## 2013-07-20 DIAGNOSIS — I10 Essential (primary) hypertension: Secondary | ICD-10-CM

## 2013-07-20 DIAGNOSIS — G3184 Mild cognitive impairment, so stated: Secondary | ICD-10-CM

## 2013-07-20 DIAGNOSIS — S50909A Unspecified superficial injury of unspecified elbow, initial encounter: Secondary | ICD-10-CM

## 2013-07-22 ENCOUNTER — Other Ambulatory Visit: Payer: Self-pay | Admitting: Nurse Practitioner

## 2013-07-22 MED ORDER — WARFARIN SODIUM 6 MG PO TABS: 6.0000 mg | ORAL_TABLET | Freq: Every day | ORAL | Status: DC

## 2013-07-23 ENCOUNTER — Telehealth: Payer: Self-pay

## 2013-07-23 NOTE — Telephone Encounter (Signed)
FYI: Had a fall yesterday went to to the ER  Nothing broken  Some bruising

## 2013-07-28 ENCOUNTER — Other Ambulatory Visit: Payer: Self-pay | Admitting: Nurse Practitioner

## 2013-07-28 ENCOUNTER — Encounter: Payer: Self-pay | Admitting: General Practice

## 2013-07-28 ENCOUNTER — Ambulatory Visit (INDEPENDENT_AMBULATORY_CARE_PROVIDER_SITE_OTHER): Payer: Medicare Other | Admitting: General Practice

## 2013-07-28 VITALS — BP 144/64 | HR 79 | Temp 97.3°F | Ht 59.0 in | Wt 192.0 lb

## 2013-07-28 DIAGNOSIS — Z86718 Personal history of other venous thrombosis and embolism: Secondary | ICD-10-CM

## 2013-07-28 DIAGNOSIS — L0291 Cutaneous abscess, unspecified: Secondary | ICD-10-CM

## 2013-07-28 DIAGNOSIS — L0390 Cellulitis, unspecified: Secondary | ICD-10-CM

## 2013-07-28 LAB — POCT INR: INR: 2.7

## 2013-07-28 MED ORDER — DOXYCYCLINE HYCLATE 100 MG PO TABS: 100.0000 mg | ORAL_TABLET | Freq: Two times a day (BID) | ORAL | Status: DC

## 2013-07-28 NOTE — Telephone Encounter (Signed)
Last seen 05/27/13  [name] Walker  This med not on [employer]

## 2013-07-28 NOTE — Patient Instructions (Signed)
Cellulitis Cellulitis is an infection of the skin and the tissue beneath it. The infected area is usually red and tender. Cellulitis occurs most often in the arms and lower legs.  CAUSES  Cellulitis is caused by bacteria that enter the skin through cracks or cuts in the skin. The most common types of bacteria that cause cellulitis are Staphylococcus and Streptococcus. SYMPTOMS   Redness and warmth.  Swelling.  Tenderness or pain.  Fever. DIAGNOSIS  Your caregiver can usually determine what is wrong based on a physical exam. Blood tests may also be done. TREATMENT  Treatment usually involves taking an antibiotic medicine. HOME CARE INSTRUCTIONS   Take your antibiotics as directed. Finish them even if you start to feel better.  Keep the infected arm or leg elevated to reduce swelling.  Apply a warm cloth to the affected area up to 4 times per day to relieve pain.  Only take over-the-counter or prescription medicines for pain, discomfort, or fever as directed by your caregiver.  Keep all follow-up appointments as directed by your caregiver. SEEK MEDICAL CARE IF:   You notice red streaks coming from the infected area.  Your red area gets larger or turns dark in color.  Your bone or joint underneath the infected area becomes painful after the skin has healed.  Your infection returns in the same area or another area.  You notice a swollen bump in the infected area.  You develop new symptoms. SEEK IMMEDIATE MEDICAL CARE IF:   You have a fever.  You feel very sleepy.  You develop vomiting or diarrhea.  You have a general ill feeling (malaise) with muscle aches and pains. MAKE SURE YOU:   Understand these instructions.  Will watch your condition.  Will get help right away if you are not doing well or get worse. Document Released: 06/20/2005 Document Revised: 03/11/2012 Document Reviewed: 11/26/2011 [employer] Patient Information 2014 [employer].  

## 2013-07-29 ENCOUNTER — Other Ambulatory Visit: Payer: Self-pay

## 2013-07-29 NOTE — Progress Notes (Signed)
  Subjective:    Patient ID: [name] Walker, female    DOB: [birth year], 79 y.o.   MRN: [id number]  HPI Patient presents today for follow up of fall, one week ago. Patient reports falling in her bathroom, upon getting out of the shower. Reports her foot "got caught on something." [name] Walker report breaking the skin on several toes of both feet and her elbows. Reports her elbows have healed, but not her toes.  Also reports needing an INR drawn today, she was being managed by a rehabilitation facility for past few months.    Review of Systems  Constitutional: Negative for fever and chills.  Respiratory: Positive for shortness of breath. Negative for chest tightness and wheezing.        History of shortness of breath on exertion  Cardiovascular: Negative for chest pain, palpitations and leg swelling.  Genitourinary: Negative for dysuria and difficulty urinating.  Skin:       Redness to toes and scabs  Neurological: Negative for dizziness, weakness and headaches.       Objective:   Physical Exam  Constitutional: She is oriented to person, place, and time. She appears well-developed and well-nourished.  Cardiovascular: Normal rate, regular rhythm and normal heart sounds.   Pulmonary/Chest: Effort normal and breath sounds normal. No respiratory distress. She exhibits no tenderness.  Neurological: She is alert and oriented to person, place, and time.  Skin: Skin is warm and dry. There is erythema.  Erythema, 1+ non-pitting edema noted to bilateral feet. abrasions and healing scabbed areas noted to toes on both feet. Negative drainage noted.   Psychiatric: She has a normal mood and affect.     Results for orders placed in visit on 07/28/13  POCT INR      Result Value Range   INR 2.7          Assessment & Plan:  1. Cellulitis  - doxycycline (VIBRA-TABS) 100 MG tablet; Take 1 tablet (100 mg total) by mouth 2 (two) times daily.  Dispense: 20 tablet; Refill: 0 -keep affected area clean  and dry -elevate lower extremities while sitting -RTO if symptoms worsen or unresolved  2. History of DVT (deep vein thrombosis)  - POCT INR -continue current coumadin regimne -RTO in one week for recheck -Patient verbalized understanding Coralie Keens, FNP-C

## 2013-07-29 NOTE — Telephone Encounter (Signed)
i could have approved these meds except the Kenolog, i dont see that in [employer]

## 2013-07-29 NOTE — Telephone Encounter (Signed)
Last seen 07/28/13  Pharmacy said this was increased while in facility

## 2013-07-30 MED ORDER — TRIAMCINOLONE ACETONIDE 0.1 % EX CREA: 1.0000 "application " | TOPICAL_CREAM | Freq: Every day | CUTANEOUS | Status: DC

## 2013-07-30 MED ORDER — OMEPRAZOLE 20 MG PO CPDR: DELAYED_RELEASE_CAPSULE | ORAL | Status: DC

## 2013-08-03 ENCOUNTER — Other Ambulatory Visit: Payer: Medicare Other

## 2013-08-05 ENCOUNTER — Other Ambulatory Visit: Payer: Self-pay | Admitting: General Practice

## 2013-08-05 ENCOUNTER — Other Ambulatory Visit (INDEPENDENT_AMBULATORY_CARE_PROVIDER_SITE_OTHER): Payer: Medicare Other | Admitting: General Practice

## 2013-08-05 DIAGNOSIS — I2699 Other pulmonary embolism without acute cor pulmonale: Secondary | ICD-10-CM

## 2013-08-05 DIAGNOSIS — Z5181 Encounter for therapeutic drug level monitoring: Secondary | ICD-10-CM

## 2013-08-05 LAB — POCT INR: INR: 1.9

## 2013-08-05 NOTE — Progress Notes (Signed)
Patient came in for labs only.

## 2013-08-05 NOTE — Addendum Note (Signed)
Addended by: Philomena Doheny E on: 08/05/2013 05:21 PM   Modules accepted: Level of Service

## 2013-08-05 NOTE — Progress Notes (Signed)
Patient ID: [name] Walker, female   DOB: [birth year], 79 y.o.   MRN: [id number]  Patient presents today for INR check. Result is 1.9 and goal is 2-3. She denies changes in diet. Patient to continue taking 6mg  daily and follow up with Tammy in one week. Patient verbalized understanding and in agreement.

## 2013-08-06 ENCOUNTER — Telehealth: Payer: Self-pay | Admitting: *Deleted

## 2013-08-06 ENCOUNTER — Ambulatory Visit (INDEPENDENT_AMBULATORY_CARE_PROVIDER_SITE_OTHER): Payer: Medicare Other | Admitting: Pharmacist

## 2013-08-06 DIAGNOSIS — I82403 Acute embolism and thrombosis of unspecified deep veins of lower extremity, bilateral: Secondary | ICD-10-CM

## 2013-08-06 DIAGNOSIS — Z7901 Long term (current) use of anticoagulants: Secondary | ICD-10-CM | POA: Insufficient documentation

## 2013-08-06 DIAGNOSIS — Z5181 Encounter for therapeutic drug level monitoring: Secondary | ICD-10-CM

## 2013-08-06 DIAGNOSIS — I82409 Acute embolism and thrombosis of unspecified deep veins of unspecified lower extremity: Secondary | ICD-10-CM

## 2013-08-06 NOTE — Telephone Encounter (Signed)
Please call [name] Walker with coumadin dose

## 2013-08-06 NOTE — Telephone Encounter (Signed)
Tina with [facility] called with warfarin instructions and appt for follow up.  See anticoagulation note for more information.

## 2013-08-22 ENCOUNTER — Other Ambulatory Visit: Payer: Self-pay | Admitting: Nurse Practitioner

## 2013-08-25 NOTE — Telephone Encounter (Signed)
Last filled 06/04/13, last seen 07/28/13, route to pool B, call into [facility]

## 2013-08-26 ENCOUNTER — Other Ambulatory Visit: Payer: Self-pay | Admitting: General Practice

## 2013-08-27 ENCOUNTER — Other Ambulatory Visit: Payer: Self-pay | Admitting: Nurse Practitioner

## 2013-08-27 ENCOUNTER — Other Ambulatory Visit: Payer: Self-pay | Admitting: *Deleted

## 2013-08-27 ENCOUNTER — Ambulatory Visit (INDEPENDENT_AMBULATORY_CARE_PROVIDER_SITE_OTHER): Payer: Medicare Other | Admitting: Pharmacist

## 2013-08-27 DIAGNOSIS — I82403 Acute embolism and thrombosis of unspecified deep veins of lower extremity, bilateral: Secondary | ICD-10-CM

## 2013-08-27 DIAGNOSIS — Z7901 Long term (current) use of anticoagulants: Secondary | ICD-10-CM

## 2013-08-27 DIAGNOSIS — I82409 Acute embolism and thrombosis of unspecified deep veins of unspecified lower extremity: Secondary | ICD-10-CM

## 2013-08-27 DIAGNOSIS — Z5181 Encounter for therapeutic drug level monitoring: Secondary | ICD-10-CM

## 2013-08-27 LAB — POCT INR: INR: 2.2

## 2013-08-27 MED ORDER — TRAMADOL HCL 50 MG PO TABS: ORAL_TABLET | ORAL | Status: DC

## 2013-08-27 NOTE — Telephone Encounter (Signed)
rx ready for pickup 

## 2013-08-27 NOTE — Patient Instructions (Signed)
Anticoagulation Dose Instructions as of 08/27/2013     Glynis Smiles Tue Wed Thu Fri Sat   New Dose 6 mg 6 mg 6 mg 6 mg 6 mg 6 mg 6 mg    Description       Continue 6mg  or 1 tablet daily       INR was 2.2 today

## 2013-08-27 NOTE — Telephone Encounter (Signed)
Patient last seen in office on 07-28-13 by [name]. Rx last dispensed on 05-07-13. Patient was at [facility] since that time and has been taking TID. Please advise. If approved please print and route to Pool B so nurse can call patient to pick up

## 2013-08-28 ENCOUNTER — Other Ambulatory Visit: Payer: Self-pay | Admitting: General Practice

## 2013-08-28 NOTE — Telephone Encounter (Signed)
Patient picked up rx.

## 2013-08-31 ENCOUNTER — Other Ambulatory Visit: Payer: Self-pay | Admitting: *Deleted

## 2013-08-31 MED ORDER — AMLODIPINE BESYLATE 2.5 MG PO TABS: 2.5000 mg | ORAL_TABLET | Freq: Every morning | ORAL | Status: DC

## 2013-08-31 NOTE — Telephone Encounter (Signed)
No potassium level since 05/14

## 2013-09-23 ENCOUNTER — Other Ambulatory Visit: Payer: Self-pay | Admitting: General Practice

## 2013-09-23 ENCOUNTER — Other Ambulatory Visit: Payer: Self-pay | Admitting: Nurse Practitioner

## 2013-09-23 ENCOUNTER — Other Ambulatory Visit: Payer: Self-pay | Admitting: *Deleted

## 2013-09-23 DIAGNOSIS — G8929 Other chronic pain: Secondary | ICD-10-CM

## 2013-09-23 MED ORDER — DONEPEZIL HCL 10 MG PO TABS: 10.0000 mg | ORAL_TABLET | Freq: Every day | ORAL | Status: DC

## 2013-09-23 MED ORDER — TRAMADOL HCL 50 MG PO TABS: ORAL_TABLET | ORAL | Status: DC

## 2013-09-23 NOTE — Telephone Encounter (Signed)
[facility] fills her pill box for her and they need this rx today. Call in

## 2013-10-08 ENCOUNTER — Ambulatory Visit (INDEPENDENT_AMBULATORY_CARE_PROVIDER_SITE_OTHER): Payer: Medicare Other | Admitting: Pharmacist

## 2013-10-08 DIAGNOSIS — I82409 Acute embolism and thrombosis of unspecified deep veins of unspecified lower extremity: Secondary | ICD-10-CM

## 2013-10-08 DIAGNOSIS — Z5181 Encounter for therapeutic drug level monitoring: Secondary | ICD-10-CM

## 2013-10-08 DIAGNOSIS — I82403 Acute embolism and thrombosis of unspecified deep veins of lower extremity, bilateral: Secondary | ICD-10-CM

## 2013-10-08 DIAGNOSIS — Z7901 Long term (current) use of anticoagulants: Secondary | ICD-10-CM

## 2013-10-08 LAB — POCT INR: INR: 2.3

## 2013-10-08 NOTE — Patient Instructions (Signed)
Anticoagulation Dose Instructions as of 10/08/2013     [name] Walker Tue Wed Thu Fri Sat   New Dose 6 mg 6 mg 6 mg 6 mg 6 mg 6 mg 6 mg    Description       Continue 6mg  or 1 tablet daily       INR was 2.3 today

## 2013-10-21 ENCOUNTER — Other Ambulatory Visit: Payer: Self-pay | Admitting: Nurse Practitioner

## 2013-10-21 ENCOUNTER — Other Ambulatory Visit: Payer: Self-pay | Admitting: General Practice

## 2013-10-22 ENCOUNTER — Other Ambulatory Visit: Payer: Self-pay | Admitting: *Deleted

## 2013-10-22 MED ORDER — GABAPENTIN 300 MG PO CAPS: 300.0000 mg | ORAL_CAPSULE | Freq: Two times a day (BID) | ORAL | Status: DC

## 2013-10-22 MED ORDER — CITALOPRAM HYDROBROMIDE 20 MG PO TABS: 20.0000 mg | ORAL_TABLET | Freq: Every day | ORAL | Status: DC

## 2013-10-22 NOTE — Telephone Encounter (Signed)
rx has to be picked up because it is controlled

## 2013-10-22 NOTE — Telephone Encounter (Signed)
Please call in today to [facility], they deliver her pill box tonight

## 2013-10-23 ENCOUNTER — Ambulatory Visit (INDEPENDENT_AMBULATORY_CARE_PROVIDER_SITE_OTHER): Payer: Medicare Other | Admitting: General Practice

## 2013-10-23 ENCOUNTER — Encounter: Payer: Self-pay | Admitting: General Practice

## 2013-10-23 VITALS — BP 122/68 | HR 84 | Temp 96.8°F | Ht 59.0 in | Wt 193.5 lb

## 2013-10-23 DIAGNOSIS — Z0289 Encounter for other administrative examinations: Secondary | ICD-10-CM

## 2013-10-23 DIAGNOSIS — Z024 Encounter for examination for driving license: Secondary | ICD-10-CM

## 2013-10-23 NOTE — Telephone Encounter (Signed)
Called into [facility] talked to tammy the

## 2013-10-27 ENCOUNTER — Other Ambulatory Visit: Payer: Self-pay | Admitting: Nurse Practitioner

## 2013-10-29 NOTE — Progress Notes (Signed)
   Subjective:    Patient ID: [name] Walker, female    DOB: 1925/01/28, 80 y.o.   MRN: [id number]  HPI Patient presents to have DMV forms completed.     Review of Systems  Constitutional: Negative for fever.  Eyes: Negative for photophobia, pain and visual disturbance.  Respiratory: Negative for cough, chest tightness and shortness of breath.   Cardiovascular: Negative for chest pain and palpitations.  Gastrointestinal: Negative for nausea, vomiting, abdominal pain and blood in stool.  Genitourinary: Negative for dysuria and difficulty urinating.  Neurological: Negative for dizziness, weakness and headaches.       Objective:   Physical Exam  Constitutional: She is oriented to person, place, and time. She appears well-developed and well-nourished.  HENT:  Head: Normocephalic and atraumatic.  Right Ear: External ear normal.  Left Ear: External ear normal.  Eyes: EOM are normal.  Neck: Normal range of motion. Neck supple. No thyromegaly present.  Cardiovascular: Normal rate, regular rhythm and normal heart sounds.   Pulmonary/Chest: Effort normal. No respiratory distress. She has decreased breath sounds in the right lower field and the left lower field. She exhibits no tenderness.  Lymphadenopathy:    She has no cervical adenopathy.  Neurological: She is alert and oriented to person, place, and time.  Skin: Skin is warm and dry.  Psychiatric: She has a normal mood and affect.          Assessment & Plan:  1. Driver's permit PE (physical examination) -DMV forms completed and faxed. -Patient verbalized understanding Erby Pian, FNP-C

## 2013-10-29 NOTE — Progress Notes (Signed)
Patient ID: [name] Walker, female   DOB: 10-12-1924, 80 y.o.   MRN: [id number]  Patient presents today to have DMV forms completed.

## 2013-11-09 ENCOUNTER — Other Ambulatory Visit: Payer: Self-pay | Admitting: Nurse Practitioner

## 2013-11-09 ENCOUNTER — Other Ambulatory Visit: Payer: Self-pay | Admitting: General Practice

## 2013-11-11 ENCOUNTER — Telehealth: Payer: Self-pay | Admitting: Nurse Practitioner

## 2013-11-12 ENCOUNTER — Other Ambulatory Visit: Payer: Self-pay | Admitting: Nurse Practitioner

## 2013-11-12 MED ORDER — TRAMADOL HCL 50 MG PO TABS: ORAL_TABLET | ORAL | Status: DC

## 2013-11-12 NOTE — Telephone Encounter (Signed)
Was filled on 10/21/13- to early

## 2013-11-12 NOTE — Telephone Encounter (Signed)
rx ready for pickup 

## 2013-11-12 NOTE — Telephone Encounter (Signed)
Stew  Called and said he knows that it looks like Marzano is early on her tramadol but she is not its the way they fix her box please refill and if you have any questions you can call him at the pharmacy

## 2013-11-12 NOTE — Telephone Encounter (Signed)
Patient daughter aware to pick up

## 2013-11-12 NOTE — Telephone Encounter (Signed)
Patient aware.

## 2013-11-24 ENCOUNTER — Other Ambulatory Visit: Payer: Self-pay | Admitting: Nurse Practitioner

## 2013-12-08 ENCOUNTER — Other Ambulatory Visit: Payer: Self-pay | Admitting: Nurse Practitioner

## 2013-12-09 ENCOUNTER — Other Ambulatory Visit: Payer: Self-pay | Admitting: General Practice

## 2013-12-10 NOTE — Telephone Encounter (Signed)
Patient aware, rx for tramadol ready.

## 2013-12-10 NOTE — Telephone Encounter (Signed)
Last refill 11/17/13. Last ov 10/23/13 with [name]. If approved please print.

## 2013-12-11 ENCOUNTER — Telehealth: Payer: Self-pay | Admitting: Family Medicine

## 2013-12-11 NOTE — Telephone Encounter (Signed)
Stew called please call in all Rx's so they can fix her bubble pack it needs to go out

## 2013-12-15 ENCOUNTER — Encounter: Payer: Self-pay | Admitting: General Practice

## 2013-12-15 ENCOUNTER — Ambulatory Visit: Payer: Medicare Other | Admitting: General Practice

## 2013-12-15 ENCOUNTER — Ambulatory Visit (INDEPENDENT_AMBULATORY_CARE_PROVIDER_SITE_OTHER): Payer: Medicare Other | Admitting: General Practice

## 2013-12-15 VITALS — BP 132/63 | HR 79 | Temp 97.2°F | Ht 59.0 in | Wt 200.4 lb

## 2013-12-15 DIAGNOSIS — Z5181 Encounter for therapeutic drug level monitoring: Secondary | ICD-10-CM

## 2013-12-15 DIAGNOSIS — I82403 Acute embolism and thrombosis of unspecified deep veins of lower extremity, bilateral: Secondary | ICD-10-CM

## 2013-12-15 DIAGNOSIS — I82409 Acute embolism and thrombosis of unspecified deep veins of unspecified lower extremity: Secondary | ICD-10-CM

## 2013-12-15 DIAGNOSIS — Z7901 Long term (current) use of anticoagulants: Secondary | ICD-10-CM

## 2013-12-15 DIAGNOSIS — Z872 Personal history of diseases of the skin and subcutaneous tissue: Secondary | ICD-10-CM

## 2013-12-15 LAB — POCT INR: INR: 2.4

## 2013-12-17 NOTE — Progress Notes (Signed)
   Subjective:    Patient ID: [name] Walker, female    DOB: [birth year], 80 y.o.   MRN: [id number]  HPI Patient presents today with complaints of bumps and moles over generalized body. Noticing some changes in moles and would like a dermatologist to evaluate. History of skin cancer.     Review of Systems  Constitutional: Negative for fever and chills.  Respiratory: Negative for chest tightness and shortness of breath.   Cardiovascular: Negative for chest pain.  Skin:       Moles over generalized body       Objective:   Physical Exam  Constitutional: She is oriented to person, place, and time. She appears well-developed and well-nourished.  Cardiovascular: Normal rate, regular rhythm and normal heart sounds.   Pulmonary/Chest: Effort normal and breath sounds normal. No respiratory distress. She exhibits no tenderness.  Neurological: She is alert and oriented to person, place, and time.  Skin: Skin is warm and dry.  Moles noted to generalized body.           Assessment & Plan:  1. Hx of skin disorder  - Ambulatory referral to Dermatology  2. DVT of lower extremity, bilateral, 3. Anticoagulation goal of INR 2 to 3  - POCT INR -RTO prn Patient verbalized understanding Erby Pian, FNP-C

## 2013-12-24 ENCOUNTER — Telehealth: Payer: Self-pay | Admitting: Pharmacist

## 2013-12-24 NOTE — Telephone Encounter (Signed)
appt to recheck INR in 1 month made.

## 2014-01-06 ENCOUNTER — Other Ambulatory Visit: Payer: Self-pay | Admitting: Nurse Practitioner

## 2014-01-07 NOTE — Telephone Encounter (Signed)
Patient last seen in office on 12-15-13. Rx last filled on 12-12-13. Please advise. If approved please print and route to Pool B so nurse can call patient to pick up

## 2014-01-08 ENCOUNTER — Other Ambulatory Visit: Payer: Self-pay | Admitting: General Practice

## 2014-01-12 ENCOUNTER — Telehealth: Payer: Self-pay | Admitting: General Practice

## 2014-01-12 NOTE — Telephone Encounter (Signed)
appt given for thurs 

## 2014-01-13 ENCOUNTER — Other Ambulatory Visit: Payer: Self-pay | Admitting: General Practice

## 2014-01-13 DIAGNOSIS — G8929 Other chronic pain: Secondary | ICD-10-CM

## 2014-01-13 DIAGNOSIS — M549 Dorsalgia, unspecified: Principal | ICD-10-CM

## 2014-01-13 MED ORDER — TRAMADOL HCL 50 MG PO TABS: 50.0000 mg | ORAL_TABLET | Freq: Every day | ORAL | Status: DC | PRN

## 2014-01-13 NOTE — Telephone Encounter (Signed)
Script ready for pick up 

## 2014-01-14 ENCOUNTER — Ambulatory Visit: Payer: Medicare Other | Admitting: Pharmacist

## 2014-01-14 ENCOUNTER — Ambulatory Visit (INDEPENDENT_AMBULATORY_CARE_PROVIDER_SITE_OTHER): Payer: Medicare Other | Admitting: General Practice

## 2014-01-14 VITALS — BP 128/67 | HR 82 | Temp 97.5°F

## 2014-01-14 DIAGNOSIS — Z5181 Encounter for therapeutic drug level monitoring: Secondary | ICD-10-CM

## 2014-01-14 DIAGNOSIS — Z79899 Other long term (current) drug therapy: Secondary | ICD-10-CM

## 2014-01-14 DIAGNOSIS — R252 Cramp and spasm: Secondary | ICD-10-CM

## 2014-01-14 DIAGNOSIS — I82409 Acute embolism and thrombosis of unspecified deep veins of unspecified lower extremity: Secondary | ICD-10-CM

## 2014-01-14 DIAGNOSIS — I82403 Acute embolism and thrombosis of unspecified deep veins of lower extremity, bilateral: Secondary | ICD-10-CM

## 2014-01-14 DIAGNOSIS — Z7901 Long term (current) use of anticoagulants: Secondary | ICD-10-CM

## 2014-01-14 LAB — POCT CBC
Granulocyte percent: 66.2 %G (ref 37–80)
HCT, POC: 40.6 % (ref 37.7–47.9)
Hemoglobin: 13 g/dL (ref 12.2–16.2)
LYMPH, POC: 1.8 (ref 0.6–3.4)
MCH, POC: 30.8 pg (ref 27–31.2)
MCHC: 32 g/dL (ref 31.8–35.4)
MCV: 96.3 fL (ref 80–97)
MPV: 6.4 fL (ref 0–99.8)
POC Granulocyte: 4.8 (ref 2–6.9)
POC LYMPH %: 25 % (ref 10–50)
Platelet Count, POC: 318 10*3/uL (ref 142–424)
RBC: 4.2 M/uL (ref 4.04–5.48)
RDW, POC: 14.2 %
WBC: 7.3 10*3/uL (ref 4.6–10.2)

## 2014-01-14 LAB — POCT INR: INR: 1.7

## 2014-01-14 NOTE — Patient Instructions (Signed)
Anticoagulation Dose Instructions as of 01/14/2014     Dorene Grebe Tue Wed Thu Fri Sat   New Dose 6 mg 6 mg 6 mg 6 mg 9 mg 6 mg 6 mg    Description       Take 2 tablets today, then increase to 1 tablet daily except 1 and 1/2 tablets on thursdays.        INR was 1.7 today (too low)

## 2014-01-14 NOTE — Progress Notes (Signed)
Patient ID: [name] Walker, female   DOB: 1925-08-09, 80 y.o.   MRN: [id number]  Patient presents today for medication review. She is currently receiving medications from [place]. Patient had concern about tramadol and amitriptyline frequency being decreased. Patient's medications were adjusted prior to discharge from physical rehab. She has a history of falls with regimen prior to physical rehab and no falls since decrease in medication. This was explained to patient. She verbalized understanding.

## 2014-01-15 LAB — BMP8+EGFR
BUN / CREAT RATIO: 22 (ref 11–26)
BUN: 20 mg/dL (ref 8–27)
CO2: 28 mmol/L (ref 18–29)
CREATININE: 0.89 mg/dL (ref 0.57–1.00)
Calcium: 9.2 mg/dL (ref 8.7–10.3)
Chloride: 98 mmol/L (ref 97–108)
GFR, EST AFRICAN AMERICAN: 68 mL/min/{1.73_m2} (ref >59)
GFR, EST NON AFRICAN AMERICAN: 59 mL/min/{1.73_m2} — AB (ref >59)
Glucose: 83 mg/dL (ref 65–99)
Potassium: 4.5 mmol/L (ref 3.5–5.2)
SODIUM: 142 mmol/L (ref 134–144)

## 2014-01-15 LAB — MAGNESIUM: Magnesium: 2.1 mg/dL (ref 1.6–2.6)

## 2014-01-17 ENCOUNTER — Encounter: Payer: Self-pay | Admitting: General Practice

## 2014-01-19 ENCOUNTER — Other Ambulatory Visit: Payer: Self-pay | Admitting: General Practice

## 2014-01-20 ENCOUNTER — Other Ambulatory Visit: Payer: Self-pay | Admitting: General Practice

## 2014-01-20 NOTE — Telephone Encounter (Signed)
Patient notified of lab results from last week and rx sent [facility] for refill omeprazole

## 2014-01-22 ENCOUNTER — Telehealth: Payer: Self-pay | Admitting: General Practice

## 2014-01-22 NOTE — Telephone Encounter (Signed)
[name] - pt thinks

## 2014-01-22 NOTE — Telephone Encounter (Signed)
Pt thinks that her tramadol should be tid- and this was to [facility]- it looks like #30 to me- please address pt is concerned

## 2014-01-26 ENCOUNTER — Telehealth: Payer: Self-pay | Admitting: General Practice

## 2014-01-27 NOTE — Telephone Encounter (Signed)
I think it would be best to change to zytrec or allegra- the atrax may make her fall and I think it would be safer to be on something else. Talk with family and let me know what they say.

## 2014-01-27 NOTE — Telephone Encounter (Signed)
There was a misunderstanding. They still carry Atarax. She hasn't been prescribed this since 02/2013 and has 1 remaining refill. Pharmacist is concerned though because it is written for TID and patient has had some recent falls. He isn't certain her family wants her taking this that often. She hasn't been taking it since she was admitted to a nursing home. She has since been discharged and noticed that it wasn't listed on her med list.  She would like to restart it for allergy symptoms. Do you want her to restart this and if so what directions should she have?

## 2014-01-28 NOTE — Telephone Encounter (Signed)
Insurance will pay for generic zyrtec

## 2014-01-28 NOTE — Telephone Encounter (Signed)
Patient doesn't want to pay for OTC antihistamine. She wants her insurance to pay for it. She argued that she doesn't get dizzy or lightheaded. I had a difficult time explaining the potential problem to her.

## 2014-02-01 ENCOUNTER — Ambulatory Visit (INDEPENDENT_AMBULATORY_CARE_PROVIDER_SITE_OTHER): Payer: Medicare Other | Admitting: Pharmacist

## 2014-02-01 DIAGNOSIS — Z7901 Long term (current) use of anticoagulants: Secondary | ICD-10-CM

## 2014-02-01 DIAGNOSIS — I82409 Acute embolism and thrombosis of unspecified deep veins of unspecified lower extremity: Secondary | ICD-10-CM

## 2014-02-01 DIAGNOSIS — I82403 Acute embolism and thrombosis of unspecified deep veins of lower extremity, bilateral: Secondary | ICD-10-CM

## 2014-02-01 DIAGNOSIS — Z5181 Encounter for therapeutic drug level monitoring: Secondary | ICD-10-CM

## 2014-02-01 LAB — POCT INR: INR: 5.3

## 2014-02-01 NOTE — Patient Instructions (Addendum)
  Anticoagulation Dose Instructions as of 02/01/2014     [name] Walker Tue Wed Thu Fri Sat   New Dose 6 mg 6 mg 6 mg 6 mg 6 mg 6 mg 6 mg    Description       NO warfarin tomorrow or Wednesdays, then decrease to 1 tablet daily       INR was 5.3 today (too thin)

## 2014-02-03 ENCOUNTER — Other Ambulatory Visit: Payer: Self-pay | Admitting: Family Medicine

## 2014-02-03 NOTE — Telephone Encounter (Signed)
Look at last office note please advise

## 2014-02-04 MED ORDER — AMITRIPTYLINE HCL 75 MG PO TABS: ORAL_TABLET | ORAL | Status: DC

## 2014-02-05 ENCOUNTER — Other Ambulatory Visit: Payer: Self-pay | Admitting: General Practice

## 2014-02-08 ENCOUNTER — Ambulatory Visit (INDEPENDENT_AMBULATORY_CARE_PROVIDER_SITE_OTHER): Payer: Medicare Other | Admitting: Pharmacist

## 2014-02-08 DIAGNOSIS — Z5181 Encounter for therapeutic drug level monitoring: Secondary | ICD-10-CM

## 2014-02-08 DIAGNOSIS — Z7901 Long term (current) use of anticoagulants: Secondary | ICD-10-CM

## 2014-02-08 DIAGNOSIS — I82409 Acute embolism and thrombosis of unspecified deep veins of unspecified lower extremity: Secondary | ICD-10-CM

## 2014-02-08 DIAGNOSIS — I82403 Acute embolism and thrombosis of unspecified deep veins of lower extremity, bilateral: Secondary | ICD-10-CM

## 2014-02-08 LAB — POCT INR: INR: 3.6

## 2014-02-08 NOTE — Telephone Encounter (Signed)
Last filled 01/13/14, will print

## 2014-02-08 NOTE — Patient Instructions (Signed)
Anticoagulation Dose Instructions as of 02/08/2014     Dorene Grebe Tue Wed Thu Fri Sat   New Dose 6 mg 6 mg 6 mg 6 mg 6 mg 3 mg 6 mg    Description       No warfarin tomorrow, then decrease to 1/2 tablet on fridays and 1 tablet all other day.       INR was 3.6 today

## 2014-02-11 ENCOUNTER — Telehealth: Payer: Self-pay | Admitting: General Practice

## 2014-02-17 ENCOUNTER — Other Ambulatory Visit: Payer: Self-pay | Admitting: General Practice

## 2014-02-17 ENCOUNTER — Other Ambulatory Visit: Payer: Self-pay | Admitting: Family Medicine

## 2014-02-17 ENCOUNTER — Ambulatory Visit (INDEPENDENT_AMBULATORY_CARE_PROVIDER_SITE_OTHER): Payer: Medicare Other | Admitting: Pharmacist

## 2014-02-17 DIAGNOSIS — Z5181 Encounter for therapeutic drug level monitoring: Secondary | ICD-10-CM

## 2014-02-17 DIAGNOSIS — I82409 Acute embolism and thrombosis of unspecified deep veins of unspecified lower extremity: Secondary | ICD-10-CM

## 2014-02-17 DIAGNOSIS — Z7901 Long term (current) use of anticoagulants: Secondary | ICD-10-CM

## 2014-02-17 DIAGNOSIS — I82403 Acute embolism and thrombosis of unspecified deep veins of lower extremity, bilateral: Secondary | ICD-10-CM

## 2014-02-17 LAB — POCT INR: INR: 2.8

## 2014-02-17 MED ORDER — LEVOCETIRIZINE DIHYDROCHLORIDE 5 MG PO TABS: 5.0000 mg | ORAL_TABLET | Freq: Every evening | ORAL | Status: DC

## 2014-02-17 NOTE — Patient Instructions (Signed)
Anticoagulation Dose Instructions as of 02/17/2014     [name] Walker Tue Wed Thu Fri Sat   New Dose 6 mg 6 mg 6 mg 6 mg 6 mg 3 mg 6 mg    Description       Continue warfarin 1/2 tablet on fridays and 1 tablet all other day.       INR was 2.8 today

## 2014-02-17 NOTE — Progress Notes (Signed)
Patient requested refill on atarax / hydroxyzine.  I am concern about her taking tid and increased risk of dry mouth and falls.  Changed to XYzal 5mg  1 tablet daily for allergies.  Change called to her pharmacy to put into medication containers. Cherre Robins, PharmD, CPP

## 2014-02-18 NOTE — Telephone Encounter (Signed)
Several attempts have been made to contact patient.  

## 2014-03-05 ENCOUNTER — Ambulatory Visit: Payer: Self-pay

## 2014-03-08 ENCOUNTER — Ambulatory Visit: Payer: Self-pay

## 2014-03-09 ENCOUNTER — Other Ambulatory Visit: Payer: Self-pay | Admitting: General Practice

## 2014-03-12 NOTE — Telephone Encounter (Signed)
Patient NTBS for follow up and lab work No diagnosis on chart to warrant pain meds

## 2014-03-12 NOTE — Telephone Encounter (Signed)
[name] needs to fill her box, could this be called in instead of picking up

## 2014-03-13 NOTE — Telephone Encounter (Signed)
Has an appointment in july

## 2014-03-13 NOTE — Telephone Encounter (Signed)
Will s\have to wait on appointment for pain meds

## 2014-03-15 NOTE — Telephone Encounter (Signed)
Tried calling daughter with no success. So called pharmacy and told them why the /Rx was refused

## 2014-03-15 NOTE — Telephone Encounter (Signed)
Tried to call patient no answer. 

## 2014-03-16 ENCOUNTER — Telehealth: Payer: Self-pay | Admitting: General Practice

## 2014-03-16 MED ORDER — TRAMADOL HCL 50 MG PO TABS: ORAL_TABLET | ORAL | Status: DC

## 2014-03-16 NOTE — Telephone Encounter (Signed)
rx ready for pickup 

## 2014-03-17 NOTE — Telephone Encounter (Signed)
Called in.

## 2014-03-26 ENCOUNTER — Other Ambulatory Visit: Payer: Self-pay | Admitting: General Practice

## 2014-03-29 ENCOUNTER — Telehealth: Payer: Self-pay | Admitting: Family Medicine

## 2014-03-29 ENCOUNTER — Other Ambulatory Visit: Payer: Self-pay | Admitting: Nurse Practitioner

## 2014-04-07 ENCOUNTER — Other Ambulatory Visit: Payer: Self-pay | Admitting: Nurse Practitioner

## 2014-04-07 NOTE — Telephone Encounter (Signed)
Patient last seen in office on 01-14-14. Patient of [name]. Rx last filled on 03-17-14 for #30. Please advise. If approved please print and route to pool B so nurse can call pt to pick up

## 2014-04-07 NOTE — Telephone Encounter (Signed)
rx ready for pickup 

## 2014-04-07 NOTE — Telephone Encounter (Signed)
Patient notified

## 2014-04-09 ENCOUNTER — Ambulatory Visit: Payer: Self-pay

## 2014-04-12 ENCOUNTER — Other Ambulatory Visit: Payer: Self-pay | Admitting: General Practice

## 2014-04-12 ENCOUNTER — Telehealth: Payer: Self-pay | Admitting: Pharmacist

## 2014-04-12 ENCOUNTER — Other Ambulatory Visit: Payer: Self-pay | Admitting: Pharmacist

## 2014-04-12 NOTE — Telephone Encounter (Signed)
Patient has missed several appts and need Protime checked prior to appt set up for August.  [facility] called and appt made for 3pm 04/15/14.

## 2014-04-15 ENCOUNTER — Ambulatory Visit (INDEPENDENT_AMBULATORY_CARE_PROVIDER_SITE_OTHER): Payer: Medicare Other | Admitting: Pharmacist

## 2014-04-15 DIAGNOSIS — I82409 Acute embolism and thrombosis of unspecified deep veins of unspecified lower extremity: Secondary | ICD-10-CM

## 2014-04-15 DIAGNOSIS — Z5181 Encounter for therapeutic drug level monitoring: Secondary | ICD-10-CM

## 2014-04-15 DIAGNOSIS — Z7901 Long term (current) use of anticoagulants: Secondary | ICD-10-CM

## 2014-04-15 DIAGNOSIS — I82403 Acute embolism and thrombosis of unspecified deep veins of lower extremity, bilateral: Secondary | ICD-10-CM

## 2014-04-15 LAB — POCT INR: INR: 3.6

## 2014-04-15 NOTE — Patient Instructions (Addendum)
Anticoagulation Dose Instructions as of 04/15/2014     Dorene Grebe Tue Wed Thu Fri Sat   New Dose 6 mg 3 mg 6 mg 6 mg 6 mg 6 mg 6 mg    Description       No warfarin tomorrow, then decrease to warfarin 1/2 tablet on Mondays and 1 tablet all other day.      INR was 3.6 today

## 2014-04-22 ENCOUNTER — Other Ambulatory Visit: Payer: Self-pay | Admitting: General Practice

## 2014-05-07 ENCOUNTER — Ambulatory Visit (INDEPENDENT_AMBULATORY_CARE_PROVIDER_SITE_OTHER): Payer: Medicare Other | Admitting: Pharmacist

## 2014-05-07 ENCOUNTER — Encounter: Payer: Self-pay | Admitting: Pharmacist

## 2014-05-07 VITALS — BP 120/64 | HR 67 | Ht 59.0 in | Wt 200.0 lb

## 2014-05-07 DIAGNOSIS — Z23 Encounter for immunization: Secondary | ICD-10-CM

## 2014-05-07 DIAGNOSIS — Z5181 Encounter for therapeutic drug level monitoring: Secondary | ICD-10-CM

## 2014-05-07 DIAGNOSIS — I82403 Acute embolism and thrombosis of unspecified deep veins of lower extremity, bilateral: Secondary | ICD-10-CM

## 2014-05-07 DIAGNOSIS — I82409 Acute embolism and thrombosis of unspecified deep veins of unspecified lower extremity: Secondary | ICD-10-CM

## 2014-05-07 DIAGNOSIS — Z13820 Encounter for screening for osteoporosis: Secondary | ICD-10-CM

## 2014-05-07 DIAGNOSIS — Z0000 Encounter for general adult medical examination without abnormal findings: Secondary | ICD-10-CM

## 2014-05-07 DIAGNOSIS — Z7901 Long term (current) use of anticoagulants: Secondary | ICD-10-CM

## 2014-05-07 LAB — POCT INR: INR: 4.1

## 2014-05-07 MED ORDER — TRAMADOL HCL 50 MG PO TABS: 50.0000 mg | ORAL_TABLET | Freq: Every day | ORAL | Status: DC

## 2014-05-07 NOTE — Progress Notes (Signed)
Subjective:    [name] Walker is a 80 y.o. female who presents for Medicare Initial Wellness Visit and recheck Protime  Preventive Screening-Counseling & Management  Tobacco History  Smoking status  . Never Smoker   Smokeless tobacco  . Never Used    Current Problems (verified) Patient Active Problem List   Diagnosis Date Noted  . Anticoagulation goal of INR 2 to 3 08/06/2013  . Weakness 07/15/2013  . DVT of lower extremity, bilateral 03/03/2012  . Cellulitis 03/02/2012  . ANEMIA, NORMOCYTIC 01/06/2009  . DEPRESSION 01/06/2009  . HYPERTENSION, UNSPECIFIED 01/06/2009  . COPD 01/06/2009  . GERD 01/06/2009  . IRRITABLE BOWEL SYNDROME 01/06/2009  . OSTEOARTHRITIS 01/06/2009    Medications Prior to Visit Current Outpatient Prescriptions on File Prior to Visit  Medication Sig Dispense Refill  . amitriptyline (ELAVIL) 75 MG tablet TAKE 1 TABLETS AT BEDTIME  30 tablet  1  . amLODipine (NORVASC) 2.5 MG tablet TAKE 1 TABLET IN MORNING  30 tablet  5  . citalopram (CELEXA) 20 MG tablet TAKE 1 TABLET DAILY  30 tablet  0  . donepezil (ARICEPT) 10 MG tablet TAKE ONE TABLET AT BEDTIME  30 tablet  0  . furosemide (LASIX) 40 MG tablet Take 1 tablet (40 mg total) by mouth 2 (two) times daily.  60 tablet  2  . levocetirizine (XYZAL) 5 MG tablet TAKE 1 TABLET DAILY AT BEDTIME  30 tablet  2  . omeprazole (PRILOSEC) 20 MG capsule Take 2 capsules daily forAcid reflux  60 capsule  2  . rOPINIRole (REQUIP) 1 MG tablet TAKE 1 OR 2 TABLETS AT BEDTIME  60 tablet  3  . triamcinolone cream (KENALOG) 0.1 % Apply 1 application topically daily.  30 g  1   No current facility-administered medications on file prior to visit.    Current Medications (verified) Current Outpatient Prescriptions  Medication Sig Dispense Refill  . amitriptyline (ELAVIL) 75 MG tablet TAKE 1 TABLETS AT BEDTIME  30 tablet  1  . amLODipine (NORVASC) 2.5 MG tablet TAKE 1 TABLET IN MORNING  30 tablet  5  . Cholecalciferol  (VITAMIN D3) 2000 UNITS CHEW Chew 1 tablet by mouth daily.      . citalopram (CELEXA) 20 MG tablet TAKE 1 TABLET DAILY  30 tablet  0  . donepezil (ARICEPT) 10 MG tablet TAKE ONE TABLET AT BEDTIME  30 tablet  0  . furosemide (LASIX) 40 MG tablet Take 1 tablet (40 mg total) by mouth 2 (two) times daily.  60 tablet  2  . levocetirizine (XYZAL) 5 MG tablet TAKE 1 TABLET DAILY AT BEDTIME  30 tablet  2  . omeprazole (PRILOSEC) 20 MG capsule Take 2 capsules daily forAcid reflux  60 capsule  2  . potassium chloride (K-DUR) 10 MEQ tablet Take 20 mEq by mouth daily.      Marland Kitchen rOPINIRole (REQUIP) 1 MG tablet TAKE 1 OR 2 TABLETS AT BEDTIME  60 tablet  3  . traMADol (ULTRAM) 50 MG tablet Take 1 tablet (50 mg total) by mouth daily.  30 tablet  0  . triamcinolone cream (KENALOG) 0.1 % Apply 1 application topically daily.  30 g  1  . warfarin (COUMADIN) 6 MG tablet Take 6 mg by mouth daily. Or as directed by coumadin clinic       No current facility-administered medications for this visit.     Allergies (verified) Iohexol; Latex; Penicillins; and Shellfish allergy   PAST HISTORY  Family History Family History  Problem Relation Age of Onset  . Rheum arthritis Mother   . Ulcers Mother     on foot  . Ulcers Father     Social History History  Substance Use Topics  . Smoking status: Never Smoker   . Smokeless tobacco: Never Used  . Alcohol Use: No     Are there smokers in your home (other than you)? No  Risk Factors Current exercise habits: The patient does not participate in regular exercise at present.  Dietary issues discussed: increasing lean proteins, fruits and low starchy vegetables  Cardiac risk factors: advanced age (older than 66 for men, 72 for women), hypertension, obesity (BMI >= 30 kg/m2) and sedentary lifestyle.  Depression Screen (Note: if answer to either of the following is "Yes", a more complete depression screening is indicated)   Over the past 2 weeks, have you felt down,  depressed or hopeless? No  Over the past 2 weeks, have you felt little interest or pleasure in doing things? No  Have you lost interest or pleasure in daily life? No  Do you often feel hopeless? No  Do you cry easily over simple problems? No  Activities of Daily Living In your present state of health, do you have any difficulty performing the following activities?:  Driving? No Managing money?  No Feeding yourself? No Getting from bed to chair? No  Climbing a flight of stairs? Yes Preparing food and eating?: No Bathing or showering? No Getting dressed: No Getting to the toilet? No Using the toilet:No Moving around from place to place: Yes In the past year have you fallen or had a near fall?:Yes   Are you sexually active?  No  Do you have more than one partner?  No  Hearing Difficulties: No Do you often ask people to speak up or repeat themselves? No Do you experience ringing or noises in your ears? No Do you have difficulty understanding soft or whispered voices? No   Do you feel that you have a problem with memory? No  Do you often misplace items? No  Do you feel safe at home?  Yes  Cognitive Testing  Alert? Yes  Normal Appearance?Yes  Oriented to person? Yes    Place? Yes   Time? Yes  Recall of three objects?  Yes  Can perform simple calculations? Yes  Displays appropriate judgment?Yes  Can read the correct time from a watch face?Yes   Advanced Directives have been discussed with the patient? Yes  List the Names of Other Physician/Practitioners you currently use: 1.  GI - Dr Laurence Spates 2.  opthalmologist - Kern Reap   Indicate any recent Medical Services you may have received from other than [facility] providers in the past year (date may be approximate).  Immunization History  Administered Date(s) Administered  . Tdap 05/07/2012  . Zoster 05/07/2014    Screening Tests Health Maintenance  Topic Date Due  . Pneumococcal Polysaccharide Vaccine Age 16  And Over  07/23/1992  . Influenza Vaccine  04/24/2014  . Tetanus/tdap  05/07/2022  . Colonoscopy  01/18/2024  . Zostavax  Completed    All answers were reviewed with the patient and necessary referrals were made:  Cherre Robins, [facility]   05/07/2014   History reviewed: allergies, current medications, past family history, past medical history, past social history, past surgical history and problem list  Objective:  Body mass index is 40.37 kg/(m^2). BP 120/64  Pulse 67  Ht 4\' 11"  (1.499 m)  Wt 200 lb (90.719  kg)  BMI 40.37 kg/m2  INR = 4.1 today   Assessment:     Initial Medicare Wellness Visit Supratherapeutic anticoagulation     Plan:     During the course of the visit the patient was educated and counseled about appropriate screening and preventive services including:    Pneumococcal vaccine - play to get at next appt  Influenza vaccine  Hepatitis B vaccine - declined  Td vaccine - UTD  Zostavax given today in office  Screening electrocardiogram  Bone densitometry screening - ordered today  Colorectal cancer screening - will verify last colonoscopy and need for future with GI  Glaucoma screening - requesting last visit from early 2015  Nutrition counseling - declined  Advanced directives: caring connections packet given  Anticoagulation Dose Instructions as of 05/07/2014     Dorene Grebe Tue Wed Thu Fri Sat   New Dose 6 mg 3 mg 6 mg 3 mg 6 mg 3 mg 6 mg    Description       No warfarin for 2 days then decrease to warfarin 1/2 tablet on Mondays, Wednesdays and Fridays and 1 tablet all other day. Changes called to [facility]     RTC in 2 weeks to check INR and is new PCP - Sherre Scarlet  Patient Instructions (the written plan) was given to the patient.  Medicare Attestation I have personally reviewed: The patient's medical and social history Their use of alcohol, tobacco or illicit drugs Their current medications and supplements The patient's  functional ability including ADLs,fall risks, home safety risks, cognitive, and hearing and visual impairment Diet and physical activities Evidence for depression or mood disorders  The patient's weight, height, BMI, and BP/HR have been recorded in the chart.  I have made referrals, counseling, and provided education to the patient based on review of the above and I have provided the patient with a written personalized care plan for preventive services.     Cherre Robins, [facility]   05/07/2014

## 2014-05-07 NOTE — Patient Instructions (Signed)
Health Maintenance Summary    ZOSTAVAX completed  Done 05/07/2014    PNEUMOCOCCAL POLYSACCHARIDE VACCINE AGE 78 AND OVER Overdue 07/23/1992  Will get at next appointment    INFLUENZA VACCINE Next Due 04/24/2014  Last 2014    TETANUS/TDAP Next Due 05/07/2022 Last 05/07/2014   Bone Denisty Referral Made Today     Eye Exam Next Due 2016     COLONOSCOPY Next Due 01/18/2024 Last 01/17/2014       Preventive Care for Adults A healthy lifestyle and preventive care can promote health and wellness. Preventive health guidelines for women include the following key practices.  A routine yearly physical is a good way to check with your health care provider about your health and preventive screening. It is a chance to share any concerns and updates on your health and to receive a thorough exam.  Visit your dentist for a routine exam and preventive care every 6 months. Brush your teeth twice a day and floss once a day. Good oral hygiene prevents tooth decay and gum disease.  The frequency of eye exams is based on your age, health, family medical history, use of contact lenses, and other factors. Follow your health care provider's recommendations for frequency of eye exams.  Eat a healthy diet. Foods like vegetables, fruits, whole grains, low-fat dairy products, and lean protein foods contain the nutrients you need without too many calories. Decrease your intake of foods high in solid fats, added sugars, and salt. Eat the right amount of calories for you.Get information about a proper diet from your health care provider, if necessary.  Regular physical exercise is one of the most important things you can do for your health. Most adults should get at least 150 minutes of moderate-intensity exercise (any activity that increases your heart rate and causes you to sweat) each week. In addition, most adults need muscle-strengthening exercises on 2 or more days a week.  Maintain a healthy weight. The body mass index  (BMI) is a screening tool to identify possible weight problems. It provides an estimate of body fat based on height and weight. Your health care provider can find your BMI and can help you achieve or maintain a healthy weight.For adults 20 years and older:  A BMI below 18.5 is considered underweight.  A BMI of 18.5 to 24.9 is normal.  A BMI of 25 to 29.9 is considered overweight.  A BMI of 30 and above is considered obese.  Maintain normal blood lipids and cholesterol levels by exercising and minimizing your intake of saturated fat. Eat a balanced diet with plenty of fruit and vegetables. Blood tests for lipids and cholesterol should begin at age 42 and be repeated every 5 years. If your lipid or cholesterol levels are high, you are over 50, or you are at high risk for heart disease, you may need your cholesterol levels checked more frequently.Ongoing high lipid and cholesterol levels should be treated with medicines if diet and exercise are not working.  If you smoke, find out from your health care provider how to quit. If you do not use tobacco, do not start.  Lung cancer screening is recommended for adults aged 55-80 years who are at high risk for developing lung cancer because of a history of smoking. A yearly low-dose CT scan of the lungs is recommended for people who have at least a 30-pack-year history of smoking and are a current smoker or have quit within the past 15 years. A pack year of  smoking is smoking an average of 1 pack of cigarettes a day for 1 year (for example: 1 pack a day for 30 years or 2 packs a day for 15 years). Yearly screening should continue until the smoker has stopped smoking for at least 15 years. Yearly screening should be stopped for people who develop a health problem that would prevent them from having lung cancer treatment.  If you are pregnant, do not drink alcohol. If you are breastfeeding, be very cautious about drinking alcohol. If you are not pregnant and  choose to drink alcohol, do not have more than 1 drink per day. One drink is considered to be 12 ounces (355 mL) of beer, 5 ounces (148 mL) of wine, or 1.5 ounces (44 mL) of liquor.  Avoid use of street drugs. Do not share needles with anyone. Ask for help if you need support or instructions about stopping the use of drugs.  High blood pressure causes heart disease and increases the risk of stroke. Your blood pressure should be checked at least every 1 to 2 years. Ongoing high blood pressure should be treated with medicines if weight loss and exercise do not work.  If you are 74-61 years old, ask your health care provider if you should take aspirin to prevent strokes.  Diabetes screening involves taking a blood sample to check your fasting blood sugar level. This should be done once every 3 years, after age 28, if you are within normal weight and without risk factors for diabetes. Testing should be considered at a younger age or be carried out more frequently if you are overweight and have at least 1 risk factor for diabetes.  Breast cancer screening is essential preventive care for women. You should practice "breast self-awareness." This means understanding the normal appearance and feel of your breasts and may include breast self-examination. Any changes detected, no matter how small, should be reported to a health care provider. Women in their 90s and 30s should have a clinical breast exam (CBE) by a health care provider as part of a regular health exam every 1 to 3 years. After age 67, women should have a CBE every year. Starting at age 62, women should consider having a mammogram (breast X-ray test) every year. Women who have a family history of breast cancer should talk to their health care provider about genetic screening. Women at a high risk of breast cancer should talk to their health care providers about having an MRI and a mammogram every year.  Breast cancer gene (BRCA)-related cancer risk  assessment is recommended for women who have family members with BRCA-related cancers. BRCA-related cancers include breast, ovarian, tubal, and peritoneal cancers. Having family members with these cancers may be associated with an increased risk for harmful changes (mutations) in the breast cancer genes BRCA1 and BRCA2. Results of the assessment will determine the need for genetic counseling and BRCA1 and BRCA2 testing.  Routine pelvic exams to screen for cancer are no longer recommended for nonpregnant women who are considered low risk for cancer of the pelvic organs (ovaries, uterus, and vagina) and who do not have symptoms. Ask your health care provider if a screening pelvic exam is right for you.  If you have had past treatment for cervical cancer or a condition that could lead to cancer, you need Pap tests and screening for cancer for at least 20 years after your treatment. If Pap tests have been discontinued, your risk factors (such as having a new sexual  partner) need to be reassessed to determine if screening should be resumed. Some women have medical problems that increase the chance of getting cervical cancer. In these cases, your health care provider may recommend more frequent screening and Pap tests.  The HPV test is an additional test that may be used for cervical cancer screening. The HPV test looks for the virus that can cause the cell changes on the cervix. The cells collected during the Pap test can be tested for HPV. The HPV test could be used to screen women aged 43 years and older, and should be used in women of any age who have unclear Pap test results. After the age of 56, women should have HPV testing at the same frequency as a Pap test.  Colorectal cancer can be detected and often prevented. Most routine colorectal cancer screening begins at the age of 32 years and continues through age 98 years. However, your health care provider may recommend screening at an earlier age if you have  risk factors for colon cancer. On a yearly basis, your health care provider may provide home test kits to check for hidden blood in the stool. Use of a small camera at the end of a tube, to directly examine the colon (sigmoidoscopy or colonoscopy), can detect the earliest forms of colorectal cancer. Talk to your health care provider about this at age 64, when routine screening begins. Direct exam of the colon should be repeated every 5-10 years through age 74 years, unless early forms of pre-cancerous polyps or small growths are found.  People who are at an increased risk for hepatitis B should be screened for this virus. You are considered at high risk for hepatitis B if:  You were born in a country where hepatitis B occurs often. Talk with your health care provider about which countries are considered high risk.  Your parents were born in a high-risk country and you have not received a shot to protect against hepatitis B (hepatitis B vaccine).  You have HIV or AIDS.  You use needles to inject street drugs.  You live with, or have sex with, someone who has hepatitis B.  You get hemodialysis treatment.  You take certain medicines for conditions like cancer, organ transplantation, and autoimmune conditions.  Hepatitis C blood testing is recommended for all people born from [birth year] through 1965 and any individual with known risks for hepatitis C.  Practice safe sex. Use condoms and avoid high-risk sexual practices to reduce the spread of sexually transmitted infections (STIs). STIs include gonorrhea, chlamydia, syphilis, trichomonas, herpes, HPV, and human immunodeficiency virus (HIV). Herpes, HIV, and HPV are viral illnesses that have no cure. They can result in disability, cancer, and death.  You should be screened for sexually transmitted illnesses (STIs) including gonorrhea and chlamydia if:  You are sexually active and are younger than 24 years.  You are older than 24 years and your health  care provider tells you that you are at risk for this type of infection.  Your sexual activity has changed since you were last screened and you are at an increased risk for chlamydia or gonorrhea. Ask your health care provider if you are at risk.  If you are at risk of being infected with HIV, it is recommended that you take a prescription medicine daily to prevent HIV infection. This is called preexposure prophylaxis (PrEP). You are considered at risk if:  You are a heterosexual woman, are sexually active, and are at increased risk  for HIV infection.  You take drugs by injection.  You are sexually active with a partner who has HIV.  Talk with your health care provider about whether you are at high risk of being infected with HIV. If you choose to begin PrEP, you should first be tested for HIV. You should then be tested every 3 months for as long as you are taking PrEP.  Osteoporosis is a disease in which the bones lose minerals and strength with aging. This can result in serious bone fractures or breaks. The risk of osteoporosis can be identified using a bone density scan. Women ages 87 years and over and women at risk for fractures or osteoporosis should discuss screening with their health care providers. Ask your health care provider whether you should take a calcium supplement or vitamin D to reduce the rate of osteoporosis.  Menopause can be associated with physical symptoms and risks. Hormone replacement therapy is available to decrease symptoms and risks. You should talk to your health care provider about whether hormone replacement therapy is right for you.  Use sunscreen. Apply sunscreen liberally and repeatedly throughout the day. You should seek shade when your shadow is shorter than you. Protect yourself by wearing long sleeves, pants, a wide-brimmed hat, and sunglasses year round, whenever you are outdoors.  Once a month, do a whole body skin exam, using a mirror to look at the skin  on your back. Tell your health care provider of new moles, moles that have irregular borders, moles that are larger than a pencil eraser, or moles that have changed in shape or color.  Stay current with required vaccines (immunizations).  Influenza vaccine. All adults should be immunized every year.  Tetanus, diphtheria, and acellular pertussis (Td, Tdap) vaccine. Pregnant women should receive 1 dose of Tdap vaccine during each pregnancy. The dose should be obtained regardless of the length of time since the last dose. Immunization is preferred during the 27th-36th week of gestation. An adult who has not previously received Tdap or who does not know her vaccine status should receive 1 dose of Tdap. This initial dose should be followed by tetanus and diphtheria toxoids (Td) booster doses every 10 years. Adults with an unknown or incomplete history of completing a 3-dose immunization series with Td-containing vaccines should begin or complete a primary immunization series including a Tdap dose. Adults should receive a Td booster every 10 years.  Varicella vaccine. An adult without evidence of immunity to varicella should receive 2 doses or a second dose if she has previously received 1 dose. Pregnant females who do not have evidence of immunity should receive the first dose after pregnancy. This first dose should be obtained before leaving the health care facility. The second dose should be obtained 4-8 weeks after the first dose.  Human papillomavirus (HPV) vaccine. Females aged 13-26 years who have not received the vaccine previously should obtain the 3-dose series. The vaccine is not recommended for use in pregnant females. However, pregnancy testing is not needed before receiving a dose. If a female is found to be pregnant after receiving a dose, no treatment is needed. In that case, the remaining doses should be delayed until after the pregnancy. Immunization is recommended for any person with an  immunocompromised condition through the age of 26 years if she did not get any or all doses earlier. During the 3-dose series, the second dose should be obtained 4-8 weeks after the first dose. The third dose should be obtained 24  weeks after the first dose and 16 weeks after the second dose.  Zoster vaccine. One dose is recommended for adults aged 66 years or older unless certain conditions are present.  Measles, mumps, and rubella (MMR) vaccine. Adults born before [birth year] generally are considered immune to measles and mumps. Adults born in [birth year] or later should have 1 or more doses of MMR vaccine unless there is a contraindication to the vaccine or there is laboratory evidence of immunity to each of the three diseases. A routine second dose of MMR vaccine should be obtained at least 28 days after the first dose for students attending postsecondary schools, health care workers, or international travelers. People who received inactivated measles vaccine or an unknown type of measles vaccine during 1963-1967 should receive 2 doses of MMR vaccine. People who received inactivated mumps vaccine or an unknown type of mumps vaccine before 1979 and are at high risk for mumps infection should consider immunization with 2 doses of MMR vaccine. For females of childbearing age, rubella immunity should be determined. If there is no evidence of immunity, females who are not pregnant should be vaccinated. If there is no evidence of immunity, females who are pregnant should delay immunization until after pregnancy. Unvaccinated health care workers born before [birth year] who lack laboratory evidence of measles, mumps, or rubella immunity or laboratory confirmation of disease should consider measles and mumps immunization with 2 doses of MMR vaccine or rubella immunization with 1 dose of MMR vaccine.  Pneumococcal 13-valent conjugate (PCV13) vaccine. When indicated, a person who is uncertain of her immunization history and has no record  of immunization should receive the PCV13 vaccine. An adult aged 78 years or older who has certain medical conditions and has not been previously immunized should receive 1 dose of PCV13 vaccine. This PCV13 should be followed with a dose of pneumococcal polysaccharide (PPSV23) vaccine. The PPSV23 vaccine dose should be obtained at least 8 weeks after the dose of PCV13 vaccine. An adult aged 13 years or older who has certain medical conditions and previously received 1 or more doses of PPSV23 vaccine should receive 1 dose of PCV13. The PCV13 vaccine dose should be obtained 1 or more years after the last PPSV23 vaccine dose.  Pneumococcal polysaccharide (PPSV23) vaccine. When PCV13 is also indicated, PCV13 should be obtained first. All adults aged 61 years and older should be immunized. An adult younger than age 92 years who has certain medical conditions should be immunized. Any person who resides in a nursing home or long-term care facility should be immunized. An adult smoker should be immunized. People with an immunocompromised condition and certain other conditions should receive both PCV13 and PPSV23 vaccines. People with human immunodeficiency virus (HIV) infection should be immunized as soon as possible after diagnosis. Immunization during chemotherapy or radiation therapy should be avoided. Routine use of PPSV23 vaccine is not recommended for American Indians, [place] Natives, or people younger than 65 years unless there are medical conditions that require PPSV23 vaccine. When indicated, people who have unknown immunization and have no record of immunization should receive PPSV23 vaccine. One-time revaccination 5 years after the first dose of PPSV23 is recommended for people aged 19-64 years who have chronic kidney failure, nephrotic syndrome, asplenia, or immunocompromised conditions. People who received 1-2 doses of PPSV23 before age 68 years should receive another dose of PPSV23 vaccine at age 33 years or  later if at least 5 years have passed since the previous dose. Doses of PPSV23 are not needed  for people immunized with PPSV23 at or after age 60 years.  Meningococcal vaccine. Adults with asplenia or persistent complement component deficiencies should receive 2 doses of quadrivalent meningococcal conjugate (MenACWY-D) vaccine. The doses should be obtained at least 2 months apart. Microbiologists working with certain meningococcal bacteria, [employer] recruits, people at risk during an outbreak, and people who travel to or live in countries with a high rate of meningitis should be immunized. A first-year college student up through age 60 years who is living in a residence hall should receive a dose if she did not receive a dose on or after her 16th birthday. Adults who have certain high-risk conditions should receive one or more doses of vaccine.  Hepatitis A vaccine. Adults who wish to be protected from this disease, have certain high-risk conditions, work with hepatitis A-infected animals, work in hepatitis A research labs, or travel to or work in countries with a high rate of hepatitis A should be immunized. Adults who were previously unvaccinated and who anticipate close contact with an international adoptee during the first 60 days after arrival in the [place] from a country with a high rate of hepatitis A should be immunized.  Hepatitis B vaccine. Adults who wish to be protected from this disease, have certain high-risk conditions, may be exposed to blood or other infectious body fluids, are household contacts or sex partners of hepatitis B positive people, are clients or workers in certain care facilities, or travel to or work in countries with a high rate of hepatitis B should be immunized.  Haemophilus influenzae type b (Hib) vaccine. A previously unvaccinated person with asplenia or sickle cell disease or having a scheduled splenectomy should receive 1 dose of Hib vaccine. Regardless of  previous immunization, a recipient of a hematopoietic stem cell transplant should receive a 3-dose series 6-12 months after her successful transplant. Hib vaccine is not recommended for adults with HIV infection. Preventive Services / Frequency Ages 3 to 58 years  Blood pressure check.** / Every 1 to 2 years.  Lipid and cholesterol check.** / Every 5 years beginning at age 69.  Clinical breast exam.** / Every 3 years for women in their 89s and 20s.  BRCA-related cancer risk assessment.** / For women who have family members with a BRCA-related cancer (breast, ovarian, tubal, or peritoneal cancers).  Pap test.** / Every 2 years from ages 24 through 57. Every 3 years starting at age 28 through age 5 or 58 with a history of 3 consecutive normal Pap tests.  HPV screening.** / Every 3 years from ages 18 through ages 36 to 60 with a history of 3 consecutive normal Pap tests.  Hepatitis C blood test.** / For any individual with known risks for hepatitis C.  Skin self-exam. / Monthly.  Influenza vaccine. / Every year.  Tetanus, diphtheria, and acellular pertussis (Tdap, Td) vaccine.** / Consult your health care provider. Pregnant women should receive 1 dose of Tdap vaccine during each pregnancy. 1 dose of Td every 10 years.  Varicella vaccine.** / Consult your health care provider. Pregnant females who do not have evidence of immunity should receive the first dose after pregnancy.  HPV vaccine. / 3 doses over 6 months, if 62 and younger. The vaccine is not recommended for use in pregnant females. However, pregnancy testing is not needed before receiving a dose.  Measles, mumps, rubella (MMR) vaccine.** / You need at least 1 dose of MMR if you were born in 1957 or later. You may also  need a 2nd dose. For females of childbearing age, rubella immunity should be determined. If there is no evidence of immunity, females who are not pregnant should be vaccinated. If there is no evidence of immunity,  females who are pregnant should delay immunization until after pregnancy.  Pneumococcal 13-valent conjugate (PCV13) vaccine.** / Consult your health care provider.  Pneumococcal polysaccharide (PPSV23) vaccine.** / 1 to 2 doses if you smoke cigarettes or if you have certain conditions.  Meningococcal vaccine.** / 1 dose if you are age 75 to 33 years and a Orthoptist living in a residence hall, or have one of several medical conditions, you need to get vaccinated against meningococcal disease. You may also need additional booster doses.  Hepatitis A vaccine.** / Consult your health care provider.  Hepatitis B vaccine.** / Consult your health care provider.  Haemophilus influenzae type b (Hib) vaccine.** / Consult your health care provider. Ages 58 to 64 years  Blood pressure check.** / Every 1 to 2 years.  Lipid and cholesterol check.** / Every 5 years beginning at age 92 years.  Lung cancer screening. / Every year if you are aged 55-80 years and have a 30-pack-year history of smoking and currently smoke or have quit within the past 15 years. Yearly screening is stopped once you have quit smoking for at least 15 years or develop a health problem that would prevent you from having lung cancer treatment.  Clinical breast exam.** / Every year after age 2 years.  BRCA-related cancer risk assessment.** / For women who have family members with a BRCA-related cancer (breast, ovarian, tubal, or peritoneal cancers).  Mammogram.** / Every year beginning at age 51 years and continuing for as long as you are in good health. Consult with your health care provider.  Pap test.** / Every 3 years starting at age 13 years through age 88 or 3 years with a history of 3 consecutive normal Pap tests.  HPV screening.** / Every 3 years from ages 23 years through ages 44 to 41 years with a history of 3 consecutive normal Pap tests.  Fecal occult blood test (FOBT) of stool. / Every year  beginning at age 51 years and continuing until age 94 years. You may not need to do this test if you get a colonoscopy every 10 years.  Flexible sigmoidoscopy or colonoscopy.** / Every 5 years for a flexible sigmoidoscopy or every 10 years for a colonoscopy beginning at age 59 years and continuing until age 71 years.  Hepatitis C blood test.** / For all people born from [birth year] through 1963 and any individual with known risks for hepatitis C.  Skin self-exam. / Monthly.  Influenza vaccine. / Every year.  Tetanus, diphtheria, and acellular pertussis (Tdap/Td) vaccine.** / Consult your health care provider. Pregnant women should receive 1 dose of Tdap vaccine during each pregnancy. 1 dose of Td every 10 years.  Varicella vaccine.** / Consult your health care provider. Pregnant females who do not have evidence of immunity should receive the first dose after pregnancy.  Zoster vaccine.** / 1 dose for adults aged 77 years or older.  Measles, mumps, rubella (MMR) vaccine.** / You need at least 1 dose of MMR if you were born in 1957 or later. You may also need a 2nd dose. For females of childbearing age, rubella immunity should be determined. If there is no evidence of immunity, females who are not pregnant should be vaccinated. If there is no evidence of immunity, females who are pregnant  should delay immunization until after pregnancy.  Pneumococcal 13-valent conjugate (PCV13) vaccine.** / Consult your health care provider.  Pneumococcal polysaccharide (PPSV23) vaccine.** / 1 to 2 doses if you smoke cigarettes or if you have certain conditions.  Meningococcal vaccine.** / Consult your health care provider.  Hepatitis A vaccine.** / Consult your health care provider.  Hepatitis B vaccine.** / Consult your health care provider.  Haemophilus influenzae type b (Hib) vaccine.** / Consult your health care provider. Ages 27 years and over  Blood pressure check.** / Every 1 to 2 years.  Lipid and  cholesterol check.** / Every 5 years beginning at age 39 years.  Lung cancer screening. / Every year if you are aged 11-80 years and have a 30-pack-year history of smoking and currently smoke or have quit within the past 15 years. Yearly screening is stopped once you have quit smoking for at least 15 years or develop a health problem that would prevent you from having lung cancer treatment.  Clinical breast exam.** / Every year after age 17 years.  BRCA-related cancer risk assessment.** / For women who have family members with a BRCA-related cancer (breast, ovarian, tubal, or peritoneal cancers).  Mammogram.** / Every year beginning at age 73 years and continuing for as long as you are in good health. Consult with your health care provider.  Pap test.** / Every 3 years starting at age 23 years through age 82 or 61 years with 3 consecutive normal Pap tests. Testing can be stopped between 65 and 70 years with 3 consecutive normal Pap tests and no abnormal Pap or HPV tests in the past 10 years.  HPV screening.** / Every 3 years from ages 63 years through ages 71 or 32 years with a history of 3 consecutive normal Pap tests. Testing can be stopped between 65 and 70 years with 3 consecutive normal Pap tests and no abnormal Pap or HPV tests in the past 10 years.  Fecal occult blood test (FOBT) of stool. / Every year beginning at age 66 years and continuing until age 43 years. You may not need to do this test if you get a colonoscopy every 10 years.  Flexible sigmoidoscopy or colonoscopy.** / Every 5 years for a flexible sigmoidoscopy or every 10 years for a colonoscopy beginning at age 91 years and continuing until age 35 years.  Hepatitis C blood test.** / For all people born from [birth year] through 1965 and any individual with known risks for hepatitis C.  Osteoporosis screening.** / A one-time screening for women ages 54 years and over and women at risk for fractures or osteoporosis.  Skin self-exam. /  Monthly.  Influenza vaccine. / Every year.  Tetanus, diphtheria, and acellular pertussis (Tdap/Td) vaccine.** / 1 dose of Td every 10 years.  Varicella vaccine.** / Consult your health care provider.  Zoster vaccine.** / 1 dose for adults aged 53 years or older.  Pneumococcal 13-valent conjugate (PCV13) vaccine.** / Consult your health care provider.  Pneumococcal polysaccharide (PPSV23) vaccine.** / 1 dose for all adults aged 30 years and older.  Meningococcal vaccine.** / Consult your health care provider.  Hepatitis A vaccine.** / Consult your health care provider.  Hepatitis B vaccine.** / Consult your health care provider.  Haemophilus influenzae type b (Hib) vaccine.** / Consult your health care provider. ** Family history and personal history of risk and conditions may change your health care provider's recommendations. Document Released: 11/06/2001 Document Revised: 01/25/2014 Document Reviewed: 02/05/2011 [facility] Patient Information 2015 [place], [place]. This  information is not intended to replace advice given to you by your health care provider. Make sure you discuss any questions you have with your health care provider.

## 2014-05-08 ENCOUNTER — Other Ambulatory Visit: Payer: Self-pay | Admitting: Pharmacist

## 2014-05-10 ENCOUNTER — Other Ambulatory Visit: Payer: Self-pay | Admitting: Family Medicine

## 2014-05-10 MED ORDER — POTASSIUM CHLORIDE ER 10 MEQ PO TBCR: 20.0000 meq | EXTENDED_RELEASE_TABLET | Freq: Every day | ORAL | Status: DC

## 2014-05-10 NOTE — Telephone Encounter (Signed)
According to  T. Eckards note on 05/07/14 pateint potassium med has been discointinued- do you remember why or if he needs to sty on it?

## 2014-05-10 NOTE — Telephone Encounter (Signed)
A duplicate potassium Rx was discontinued off patient's list.  Patient is to continue potassium 29meq 2 tablets daily with food.  Rx sent to [facility].

## 2014-05-10 NOTE — Telephone Encounter (Signed)
Can you see Tammy's last note and make sure pt is suppose to continue Potassium? Thanks.

## 2014-05-10 NOTE — Addendum Note (Signed)
Addended by: Cherre Robins on: 05/10/2014 09:13 PM   Modules accepted: Orders, Medications

## 2014-05-18 ENCOUNTER — Telehealth: Payer: Self-pay | Admitting: Family

## 2014-05-18 NOTE — Telephone Encounter (Signed)
I called pt and she decided to keep appt.

## 2014-05-22 ENCOUNTER — Other Ambulatory Visit: Payer: Self-pay | Admitting: Family Medicine

## 2014-05-24 ENCOUNTER — Encounter: Payer: Self-pay | Admitting: Family

## 2014-05-24 ENCOUNTER — Ambulatory Visit (INDEPENDENT_AMBULATORY_CARE_PROVIDER_SITE_OTHER): Payer: Medicare Other | Admitting: Family

## 2014-05-24 VITALS — BP 112/71 | HR 87 | Temp 97.3°F | Ht 59.0 in | Wt 199.4 lb

## 2014-05-24 DIAGNOSIS — K2100 Gastro-esophageal reflux disease with esophagitis, without bleeding: Secondary | ICD-10-CM

## 2014-05-24 DIAGNOSIS — Z13820 Encounter for screening for osteoporosis: Secondary | ICD-10-CM

## 2014-05-24 DIAGNOSIS — Z5181 Encounter for therapeutic drug level monitoring: Secondary | ICD-10-CM

## 2014-05-24 DIAGNOSIS — R600 Localized edema: Secondary | ICD-10-CM

## 2014-05-24 DIAGNOSIS — F329 Major depressive disorder, single episode, unspecified: Secondary | ICD-10-CM

## 2014-05-24 DIAGNOSIS — R791 Abnormal coagulation profile: Secondary | ICD-10-CM

## 2014-05-24 DIAGNOSIS — G4700 Insomnia, unspecified: Secondary | ICD-10-CM

## 2014-05-24 DIAGNOSIS — I82403 Acute embolism and thrombosis of unspecified deep veins of lower extremity, bilateral: Secondary | ICD-10-CM

## 2014-05-24 DIAGNOSIS — R609 Edema, unspecified: Secondary | ICD-10-CM

## 2014-05-24 DIAGNOSIS — I10 Essential (primary) hypertension: Secondary | ICD-10-CM

## 2014-05-24 DIAGNOSIS — Z7901 Long term (current) use of anticoagulants: Secondary | ICD-10-CM

## 2014-05-24 DIAGNOSIS — Z1321 Encounter for screening for nutritional disorder: Secondary | ICD-10-CM

## 2014-05-24 DIAGNOSIS — I82409 Acute embolism and thrombosis of unspecified deep veins of unspecified lower extremity: Secondary | ICD-10-CM

## 2014-05-24 DIAGNOSIS — F3289 Other specified depressive episodes: Secondary | ICD-10-CM

## 2014-05-24 DIAGNOSIS — R799 Abnormal finding of blood chemistry, unspecified: Secondary | ICD-10-CM

## 2014-05-24 LAB — POCT CBC
GRANULOCYTE PERCENT: 78 % (ref 37–80)
HCT, POC: 38.5 % (ref 37.7–47.9)
Hemoglobin: 12.8 g/dL (ref 12.2–16.2)
LYMPH, POC: 1.3 (ref 0.6–3.4)
MCH, POC: 32.2 pg — AB (ref 27–31.2)
MCHC: 33.4 g/dL (ref 31.8–35.4)
MCV: 96.4 fL (ref 80–97)
MPV: 7.5 fL (ref 0–99.8)
POC Granulocyte: 5.1 (ref 2–6.9)
POC LYMPH PERCENT: 19.6 %L (ref 10–50)
Platelet Count, POC: 272 10*3/uL (ref 142–424)
RBC: 4 M/uL — AB (ref 4.04–5.48)
RDW, POC: 13.8 %
WBC: 6.6 10*3/uL (ref 4.6–10.2)

## 2014-05-24 LAB — POCT INR: INR: >8

## 2014-05-24 MED ORDER — ROPINIROLE HCL 1 MG PO TABS: ORAL_TABLET | ORAL | Status: DC

## 2014-05-24 MED ORDER — LEVOCETIRIZINE DIHYDROCHLORIDE 5 MG PO TABS: ORAL_TABLET | ORAL | Status: DC

## 2014-05-24 MED ORDER — PHYTONADIONE 5 MG PO TABS
5.0000 mg | ORAL_TABLET | Freq: Once | ORAL | Status: AC
  Administered 2014-05-24: 5 mg via ORAL

## 2014-05-24 MED ORDER — AMLODIPINE BESYLATE 2.5 MG PO TABS: ORAL_TABLET | ORAL | Status: DC

## 2014-05-24 MED ORDER — GABAPENTIN 300 MG PO CAPS: 300.0000 mg | ORAL_CAPSULE | Freq: Two times a day (BID) | ORAL | Status: DC

## 2014-05-24 MED ORDER — DONEPEZIL HCL 10 MG PO TABS: ORAL_TABLET | ORAL | Status: DC

## 2014-05-24 MED ORDER — OMEPRAZOLE 20 MG PO CPDR: DELAYED_RELEASE_CAPSULE | ORAL | Status: DC

## 2014-05-24 MED ORDER — FUROSEMIDE 40 MG PO TABS: ORAL_TABLET | ORAL | Status: DC

## 2014-05-24 MED ORDER — AMITRIPTYLINE HCL 75 MG PO TABS: ORAL_TABLET | ORAL | Status: DC

## 2014-05-24 MED ORDER — CITALOPRAM HYDROBROMIDE 20 MG PO TABS: ORAL_TABLET | ORAL | Status: DC

## 2014-05-24 NOTE — Progress Notes (Signed)
Subjective:    Patient ID: [name] Walker, female    DOB: [birth year], 80 y.o.   MRN: [id number]  Hypertension This is a chronic problem. The current episode started more than 1 year ago. The problem has been resolved since onset. The problem is controlled. Associated symptoms include peripheral edema. Pertinent negatives include no anxiety, headaches, palpitations or shortness of breath. Past treatments include calcium channel blockers and diuretics. The current treatment provides significant improvement. Hypertensive end-organ damage includes PVD. There is no history of kidney disease, CAD/MI, heart failure or a thyroid problem. There is no history of sleep apnea.  Gastrophageal Reflux She reports no heartburn or no sore throat. This is a chronic problem. The current episode started more than 1 year ago. The problem occurs rarely. The problem has been resolved. The symptoms are aggravated by certain foods. She has tried a PPI for the symptoms. The treatment provided significant relief.      Review of Systems  Constitutional: Negative.   HENT: Negative.  Negative for sore throat.   Eyes: Negative.   Respiratory: Negative.  Negative for shortness of breath.   Cardiovascular: Negative.  Negative for palpitations.  Gastrointestinal: Negative.  Negative for heartburn.  Endocrine: Negative.   Genitourinary: Negative.   Musculoskeletal: Negative.   Neurological: Negative.  Negative for headaches.  Hematological: Negative.   Psychiatric/Behavioral: Negative.   All other systems reviewed and are negative.      Objective:   Physical Exam  Vitals reviewed. Constitutional: She is oriented to person, place, and time. She appears well-developed and well-nourished. No distress.  HENT:  Head: Normocephalic and atraumatic.  Right Ear: External ear normal.  Mouth/Throat: Oropharynx is clear and moist.  Eyes: Pupils are equal, round, and reactive to light.  Neck: Normal range of motion.  Neck supple. No thyromegaly present.  Cardiovascular: Normal rate, regular rhythm, normal heart sounds and intact distal pulses.   No murmur heard. Pulmonary/Chest: Effort normal and breath sounds normal. No respiratory distress. She has no wheezes.  Abdominal: Soft. Bowel sounds are normal. She exhibits no distension. There is no tenderness.  Musculoskeletal: Normal range of motion. She exhibits edema (Mild swelling in bilateral legs). She exhibits no tenderness.  Neurological: She is alert and oriented to person, place, and time. She has normal reflexes. No cranial nerve deficit.  Skin: Skin is warm and dry. There is erythema (Bilateral legs-PVD).  Psychiatric: She has a normal mood and affect. Her behavior is normal. Judgment and thought content normal.      BP 112/71  Pulse 87  Temp(Src) 97.3 F (36.3 C) (Oral)  Ht $R'4\' 11"'ID$  (1.499 m)  Wt 199 lb 6.4 oz (90.447 kg)  BMI 40.25 kg/m2     Assessment & Plan:  1. DVT of lower extremity, bilateral - POCT INR - POCT CBC - CMP14+EGFR  2. Anticoagulation goal of INR 2 to 3 - POCT INR - CMP14+EGFR  3. HYPERTENSION, UNSPECIFIED - CMP14+EGFR - amLODipine (NORVASC) 2.5 MG tablet; TAKE 1 TABLET IN MORNING  Dispense: 30 tablet; Refill: 5  4. Gastroesophageal reflux disease with esophagitis - CMP14+EGFR - omeprazole (PRILOSEC) 20 MG capsule; Take 2 capsules daily forAcid reflux  Dispense: 60 capsule; Refill: 6  5. DEPRESSION - CMP14+EGFR - citalopram (CELEXA) 20 MG tablet; TAKE 1 TABLET DAILY  Dispense: 30 tablet; Refill: 6  6. Elevated INR - POCT CBC - INR  7. Encounter for vitamin deficiency screening - Vit D  25 hydroxy (rtn osteoporosis monitoring)  8. Osteoporosis screening -  DG Bone Density; Future  9. Peripheral edema - furosemide (LASIX) 40 MG tablet; TAKE  (1)  TABLET TWICE A DAY.  Dispense: 60 tablet; Refill: 6  10. Insomnia  - amitriptyline (ELAVIL) 75 MG tablet; TAKE 1 TABLETS AT BEDTIME  Dispense: 30 tablet;  Refill: 6 - rOPINIRole (REQUIP) 1 MG tablet; TAKE 1 OR 2 TABLETS AT BEDTIME  Dispense: 60 tablet; Refill: 6   Continue all meds-Pt to stop warfarin and Vit K given Labs pending Health Maintenance reviewed Diet and exercise encouraged RTO 3 months- Pt has appointment with Tammy on Wednesday to f/u on INR  Evelina Dun, FNP

## 2014-05-24 NOTE — Patient Instructions (Signed)

## 2014-05-25 LAB — CMP14+EGFR
A/G RATIO: 1.6 (ref 1.1–2.5)
ALBUMIN: 4.1 g/dL (ref 3.5–4.7)
ALT: 16 IU/L (ref 0–32)
AST: 25 IU/L (ref 0–40)
Alkaline Phosphatase: 133 IU/L — ABNORMAL HIGH (ref 39–117)
BUN/Creatinine Ratio: 10 — ABNORMAL LOW (ref 11–26)
BUN: 12 mg/dL (ref 8–27)
CO2: 29 mmol/L (ref 18–29)
Calcium: 9.2 mg/dL (ref 8.7–10.3)
Chloride: 96 mmol/L — ABNORMAL LOW (ref 97–108)
Creatinine, Ser: 1.15 mg/dL — ABNORMAL HIGH (ref 0.57–1.00)
GFR calc Af Amer: 50 mL/min/{1.73_m2} — ABNORMAL LOW (ref >59)
GFR calc non Af Amer: 43 mL/min/{1.73_m2} — ABNORMAL LOW (ref >59)
GLOBULIN, TOTAL: 2.6 g/dL (ref 1.5–4.5)
Glucose: 94 mg/dL (ref 65–99)
Potassium: 3.4 mmol/L — ABNORMAL LOW (ref 3.5–5.2)
Sodium: 144 mmol/L (ref 134–144)
Total Bilirubin: 0.3 mg/dL (ref 0.0–1.2)
Total Protein: 6.7 g/dL (ref 6.0–8.5)

## 2014-05-25 LAB — VITAMIN D 25 HYDROXY (VIT D DEFICIENCY, FRACTURES): Vit D, 25-Hydroxy: 38.4 ng/mL (ref 30.0–100.0)

## 2014-05-27 ENCOUNTER — Ambulatory Visit (INDEPENDENT_AMBULATORY_CARE_PROVIDER_SITE_OTHER): Payer: Medicare Other | Admitting: Pharmacist

## 2014-05-27 ENCOUNTER — Other Ambulatory Visit: Payer: Self-pay | Admitting: Family

## 2014-05-27 DIAGNOSIS — Z5181 Encounter for therapeutic drug level monitoring: Secondary | ICD-10-CM

## 2014-05-27 DIAGNOSIS — Z7901 Long term (current) use of anticoagulants: Secondary | ICD-10-CM

## 2014-05-27 DIAGNOSIS — I82409 Acute embolism and thrombosis of unspecified deep veins of unspecified lower extremity: Secondary | ICD-10-CM

## 2014-05-27 DIAGNOSIS — I82403 Acute embolism and thrombosis of unspecified deep veins of lower extremity, bilateral: Secondary | ICD-10-CM

## 2014-05-27 LAB — POCT INR: INR: 1.6

## 2014-05-27 NOTE — Patient Instructions (Signed)
Anticoagulation Dose Instructions as of 05/27/2014     Dorene Grebe Tue Wed Thu Fri Sat   New Dose 3 mg 3 mg 3 mg 3 mg 3 mg 3 mg 3 mg      INR was 1.6 today

## 2014-06-04 ENCOUNTER — Ambulatory Visit (INDEPENDENT_AMBULATORY_CARE_PROVIDER_SITE_OTHER): Payer: Medicare Other | Admitting: Pharmacist

## 2014-06-04 DIAGNOSIS — I82403 Acute embolism and thrombosis of unspecified deep veins of lower extremity, bilateral: Secondary | ICD-10-CM

## 2014-06-04 DIAGNOSIS — Z7901 Long term (current) use of anticoagulants: Secondary | ICD-10-CM

## 2014-06-04 DIAGNOSIS — Z5181 Encounter for therapeutic drug level monitoring: Secondary | ICD-10-CM

## 2014-06-04 DIAGNOSIS — I82409 Acute embolism and thrombosis of unspecified deep veins of unspecified lower extremity: Secondary | ICD-10-CM

## 2014-06-04 LAB — POCT INR: INR: 1.2

## 2014-06-04 NOTE — Progress Notes (Signed)
Patient ID: [name] Walker, female   DOB: 19-May-1925, 80 y.o.   MRN: [id number]  Patient seen for recheck Protime see anticoagulation notes.

## 2014-06-04 NOTE — Patient Instructions (Signed)
Anticoagulation Dose Instructions as of 06/04/2014     [name] Walker Tue Wed Thu Fri Sat   New Dose 3 mg 6 mg 3 mg 3 mg 3 mg 6 mg 3 mg    Description       Take 1 and 1/2 tablets today (06/04/2014) and tomorrow (06/05/2014).  Then increase dose to 1 tablet on mondays and fridays and 1/2 tablet all other days.       INR was 1.2 today

## 2014-06-09 ENCOUNTER — Other Ambulatory Visit: Payer: Self-pay | Admitting: Nurse Practitioner

## 2014-06-10 NOTE — Telephone Encounter (Signed)
Patient last seen in office on 05-24-14. Rx last filled on 05-13-14 for #30. Please advise on refill.  If approved please route to pool B so nurse can call patient to pick up. Rx will print

## 2014-06-10 NOTE — Telephone Encounter (Signed)
rx ready for pickup 

## 2014-06-11 NOTE — Telephone Encounter (Signed)
Pt aware.

## 2014-06-14 ENCOUNTER — Ambulatory Visit (INDEPENDENT_AMBULATORY_CARE_PROVIDER_SITE_OTHER): Payer: Medicare Other | Admitting: Pharmacist

## 2014-06-14 DIAGNOSIS — Z7901 Long term (current) use of anticoagulants: Secondary | ICD-10-CM

## 2014-06-14 DIAGNOSIS — I82409 Acute embolism and thrombosis of unspecified deep veins of unspecified lower extremity: Secondary | ICD-10-CM

## 2014-06-14 DIAGNOSIS — Z5181 Encounter for therapeutic drug level monitoring: Secondary | ICD-10-CM

## 2014-06-14 DIAGNOSIS — I82403 Acute embolism and thrombosis of unspecified deep veins of lower extremity, bilateral: Secondary | ICD-10-CM

## 2014-06-14 LAB — POCT INR: INR: 1.6

## 2014-06-14 MED ORDER — APIXABAN 2.5 MG PO TABS: 2.5000 mg | ORAL_TABLET | Freq: Two times a day (BID) | ORAL | Status: DC

## 2014-06-14 NOTE — Patient Instructions (Signed)
Warfarin discontinued  - started Eliquis 2.5mg  1 tablet twice a day instead.

## 2014-07-07 ENCOUNTER — Ambulatory Visit: Payer: Medicare Other

## 2014-07-07 ENCOUNTER — Other Ambulatory Visit: Payer: Self-pay

## 2014-07-07 ENCOUNTER — Other Ambulatory Visit: Payer: Self-pay | Admitting: Nurse Practitioner

## 2014-07-07 ENCOUNTER — Other Ambulatory Visit: Payer: Medicare Other

## 2014-07-08 NOTE — Telephone Encounter (Signed)
Last ov 05/24/14. Last refill 06/11/14. If approved print and let pt know to pickup.

## 2014-07-08 NOTE — Telephone Encounter (Signed)
CALLED IN 

## 2014-07-15 ENCOUNTER — Ambulatory Visit (INDEPENDENT_AMBULATORY_CARE_PROVIDER_SITE_OTHER): Payer: Medicare Other | Admitting: Pharmacist

## 2014-07-15 ENCOUNTER — Other Ambulatory Visit: Payer: Self-pay | Admitting: *Deleted

## 2014-07-15 DIAGNOSIS — Z23 Encounter for immunization: Secondary | ICD-10-CM

## 2014-07-15 DIAGNOSIS — Z79899 Other long term (current) drug therapy: Secondary | ICD-10-CM

## 2014-07-15 DIAGNOSIS — R413 Other amnesia: Secondary | ICD-10-CM

## 2014-07-15 LAB — POCT CBC
GRANULOCYTE PERCENT: 77.9 % (ref 37–80)
HEMATOCRIT: 38.9 % (ref 37.7–47.9)
HEMOGLOBIN: 12.9 g/dL (ref 12.2–16.2)
Lymph, poc: 1.7 (ref 0.6–3.4)
MCH, POC: 31.9 pg — AB (ref 27–31.2)
MCHC: 33.2 g/dL (ref 31.8–35.4)
MCV: 95.9 fL (ref 80–97)
MPV: 6.6 fL (ref 0–99.8)
POC GRANULOCYTE: 6.7 (ref 2–6.9)
POC LYMPH PERCENT: 19.5 %L (ref 10–50)
Platelet Count, POC: 320 10*3/uL (ref 142–424)
RBC: 4.1 M/uL (ref 4.04–5.48)
RDW, POC: 14.2 %
WBC: 8.6 10*3/uL (ref 4.6–10.2)

## 2014-07-15 MED ORDER — DONEPEZIL HCL 10 MG PO TABS: 10.0000 mg | ORAL_TABLET | Freq: Every day | ORAL | Status: DC

## 2014-07-15 NOTE — Progress Notes (Signed)
Warfarin was actually discontinued about 4 weeks ago and eliquis 2.5mg  bid started.  Patient was suppose to come in to have CBC checked and have DEXA but when she rescheduled appt she was scheduled under a protime appt.  Patient has tolerated Eliquis well.  She brings in paperwork from [facility] DOT today and needs additional form filled out.  Papers were copied and given to Howell Pringle, RN in our referral department.  Patient also asked if she could restart aricept.  It was only discontinued about 2 months ago at patient's request because she felt that it was not helping.  Discussed with her PCP and Aricept Rx called to [facility].  CBC= WNL  Assessment:   Anticoagulation - patient tolerating Eliquis w/o problems. Memory Deficit  Plan:  Continue Eliquis 2.5mg  1 tablet BID Restart Aricept 10mg  1 tablet qpm with food  Orders Placed This Encounter  Procedures  . POCT CBC     Cherre Robins, PharmD, CPP

## 2014-08-11 ENCOUNTER — Other Ambulatory Visit: Payer: Self-pay | Admitting: Nurse Practitioner

## 2014-08-11 NOTE — Telephone Encounter (Signed)
Pt informed Rx ready for pick up. 

## 2014-08-11 NOTE — Telephone Encounter (Signed)
Last seen 05/24/14  [name] Walker  If approved print and route to nurse

## 2014-08-11 NOTE — Telephone Encounter (Signed)
Ultram rx ready for ppick up

## 2014-08-16 ENCOUNTER — Ambulatory Visit (INDEPENDENT_AMBULATORY_CARE_PROVIDER_SITE_OTHER): Payer: Medicare Other | Admitting: Family Medicine

## 2014-08-16 ENCOUNTER — Encounter: Payer: Self-pay | Admitting: Family Medicine

## 2014-08-16 VITALS — BP 124/63 | HR 83 | Temp 97.8°F | Ht 59.0 in | Wt 193.8 lb

## 2014-08-16 DIAGNOSIS — Z0000 Encounter for general adult medical examination without abnormal findings: Secondary | ICD-10-CM

## 2014-08-16 DIAGNOSIS — Z024 Encounter for examination for driving license: Secondary | ICD-10-CM

## 2014-08-16 NOTE — Progress Notes (Signed)
   Subjective:    Patient ID: [name] Walker, female    DOB: 17-Jan-1925, 80 y.o.   MRN: [id number]  HPI 80 year old female here to complete division of motor vehicle form. Palate she had her license revoked for reasons that are unclear and now we are tasked with completing the reinstatement form. There are several categories on the form which might pertain to her ability to drive these include musculoskeletal impairment as well as cognitive impairments.  There are also overriding family issues. Her children do not think she should drive in both the patient and family are adamant in their opinions.    Review of Systems  Constitutional: Negative.   HENT: Negative.   Eyes: Negative.   Respiratory: Negative.   Cardiovascular: Negative.   Gastrointestinal: Negative.   Endocrine: Negative.   Genitourinary: Negative.   Musculoskeletal: Positive for myalgias.       Bilateral thumb pain  Skin: Rash: not true rash but healing intertrigo.  Hematological: Negative.   Psychiatric/Behavioral: Negative.        Objective:   Physical Exam  Constitutional: She is oriented to person, place, and time. She appears well-developed and well-nourished.  Eyes: Conjunctivae and EOM are normal.  Neck: Normal range of motion. Neck supple.  Cardiovascular: Normal rate, regular rhythm and normal heart sounds.   Pulmonary/Chest: Effort normal and breath sounds normal.  Abdominal: Soft. Bowel sounds are normal.  Musculoskeletal: Normal range of motion.  Neurological: She is alert and oriented to person, place, and time. She has normal reflexes.  Patient underwent mental status exam and scored well. I cannot in all honesty, withhold her driver's license based on cognitive impairment  Skin: Skin is warm and dry.  Psychiatric: She has a normal mood and affect. Her behavior is normal. Thought content normal.    BP 124/63 mmHg  Pulse 83  Temp(Src) 97.8 F (36.6 C) (Oral)  Ht 4\' 11"  (1.499 m)  Wt 193 lb  12.8 oz (87.907 kg)  BMI 39.12 kg/m2      Assessment & Plan:  1. Routine general medical examination at a health care facility I can find no medical reason why she cannot drive and will sign the DMV form stating that. I did speak with her daughter who brought her here today and explained my findings and rationale. If she does not drive will have to be a family decision rather than a medical one.  Wardell Honour MD

## 2014-08-17 ENCOUNTER — Telehealth: Payer: Self-pay | Admitting: *Deleted

## 2014-08-17 NOTE — Telephone Encounter (Signed)
Verbal orders for tramadol refill given to pharmacy.  Patient has original hard copy.

## 2014-08-18 ENCOUNTER — Ambulatory Visit: Payer: Medicare Other | Admitting: Family Medicine

## 2014-08-27 ENCOUNTER — Telehealth: Payer: Self-pay | Admitting: Family Medicine

## 2014-08-27 NOTE — Telephone Encounter (Signed)
Faxed 08-17-14, pt aware

## 2014-09-03 ENCOUNTER — Telehealth: Payer: Self-pay | Admitting: Family Medicine

## 2014-09-06 ENCOUNTER — Other Ambulatory Visit: Payer: Self-pay | Admitting: Family

## 2014-09-08 ENCOUNTER — Other Ambulatory Visit: Payer: Self-pay | Admitting: Pharmacist

## 2014-09-08 ENCOUNTER — Other Ambulatory Visit: Payer: Self-pay | Admitting: Family Medicine

## 2014-09-08 NOTE — Telephone Encounter (Signed)
Last seen 08/16/14  Dr Sabra Heck  If approved print and route to nurse

## 2014-09-09 NOTE — Telephone Encounter (Signed)
Informed pt Rx ready for pick up.

## 2014-09-20 ENCOUNTER — Other Ambulatory Visit: Payer: Self-pay | Admitting: Pharmacist

## 2014-09-24 DIAGNOSIS — K118 Other diseases of salivary glands: Secondary | ICD-10-CM

## 2014-09-24 HISTORY — DX: Other diseases of salivary glands: K11.8

## 2014-09-29 ENCOUNTER — Other Ambulatory Visit: Payer: Self-pay

## 2014-09-29 ENCOUNTER — Ambulatory Visit: Payer: Medicare Other

## 2014-10-09 ENCOUNTER — Other Ambulatory Visit: Payer: Self-pay | Admitting: Family Medicine

## 2014-10-13 ENCOUNTER — Telehealth: Payer: Self-pay | Admitting: Family Medicine

## 2014-10-13 ENCOUNTER — Other Ambulatory Visit: Payer: Self-pay | Admitting: *Deleted

## 2014-10-13 NOTE — Telephone Encounter (Signed)
Tramadol script called in to [facility].

## 2014-10-13 NOTE — Telephone Encounter (Signed)
refaxed DMV papers, pt aware

## 2014-10-26 ENCOUNTER — Ambulatory Visit: Payer: Self-pay | Admitting: Family

## 2014-11-09 DIAGNOSIS — Z79899 Other long term (current) drug therapy: Secondary | ICD-10-CM | POA: Diagnosis not present

## 2014-11-09 DIAGNOSIS — S0990XA Unspecified injury of head, initial encounter: Secondary | ICD-10-CM | POA: Diagnosis not present

## 2014-11-09 DIAGNOSIS — S0081XA Abrasion of other part of head, initial encounter: Secondary | ICD-10-CM | POA: Diagnosis not present

## 2014-11-09 DIAGNOSIS — Z8249 Family history of ischemic heart disease and other diseases of the circulatory system: Secondary | ICD-10-CM | POA: Diagnosis not present

## 2014-11-09 DIAGNOSIS — W19XXXA Unspecified fall, initial encounter: Secondary | ICD-10-CM | POA: Diagnosis not present

## 2014-11-09 DIAGNOSIS — I10 Essential (primary) hypertension: Secondary | ICD-10-CM | POA: Diagnosis not present

## 2014-11-09 DIAGNOSIS — S0083XA Contusion of other part of head, initial encounter: Secondary | ICD-10-CM | POA: Diagnosis not present

## 2014-11-09 DIAGNOSIS — Z86718 Personal history of other venous thrombosis and embolism: Secondary | ICD-10-CM | POA: Diagnosis not present

## 2014-11-09 DIAGNOSIS — Y92019 Unspecified place in single-family (private) house as the place of occurrence of the external cause: Secondary | ICD-10-CM | POA: Diagnosis not present

## 2014-11-12 ENCOUNTER — Ambulatory Visit: Payer: Self-pay | Admitting: Family Medicine

## 2014-11-15 ENCOUNTER — Telehealth: Payer: Self-pay | Admitting: Family Medicine

## 2014-11-15 ENCOUNTER — Other Ambulatory Visit: Payer: Self-pay | Admitting: Family Medicine

## 2014-11-15 NOTE — Telephone Encounter (Signed)
Appointment given for Thursday with Sabra Heck

## 2014-11-18 ENCOUNTER — Encounter: Payer: Self-pay | Admitting: Family Medicine

## 2014-11-18 ENCOUNTER — Ambulatory Visit (INDEPENDENT_AMBULATORY_CARE_PROVIDER_SITE_OTHER): Payer: Medicare Other | Admitting: Family Medicine

## 2014-11-18 VITALS — BP 102/65 | HR 84 | Temp 97.7°F | Ht <= 58 in | Wt 195.0 lb

## 2014-11-18 DIAGNOSIS — I10 Essential (primary) hypertension: Secondary | ICD-10-CM

## 2014-11-18 NOTE — Progress Notes (Signed)
   Subjective:    Patient ID: [name] Walker, female    DOB: [birth year], 81 y.o.   MRN: [id number]  HPI  81 year old female who is here for hospital follow-up. She suffered a fall described as tripping fell had a small laceration of her right brow which was not sutured. Apparently not large enough to require suturing. Had a CT or other x-ray which was okay and since then has felt fine.  Her main concern today is the fact that she still has not had her driver's license reinstated. When I saw her back in November I sent papers to the [facility] saying I cannot find a physical reason why she did not drive but her family is opposed to driving and I am trying to stay out of the middle of that argument. She implies that [facility] has not received the paperwork and ask for a copy of the papers from [place] today so she can mail to [place] herself.  Patient Active Problem List   Diagnosis Date Noted  . Weakness 07/15/2013  . Cellulitis 03/02/2012  . ANEMIA, NORMOCYTIC 01/06/2009  . DEPRESSION 01/06/2009  . HYPERTENSION, UNSPECIFIED 01/06/2009  . COPD 01/06/2009  . GERD 01/06/2009  . IRRITABLE BOWEL SYNDROME 01/06/2009  . OSTEOARTHRITIS 01/06/2009   Outpatient Encounter Prescriptions as of 11/18/2014  Medication Sig  . amitriptyline (ELAVIL) 75 MG tablet TAKE 1 TABLETS AT BEDTIME  . amLODipine (NORVASC) 2.5 MG tablet TAKE 1 TABLET IN MORNING  . Cholecalciferol (VITAMIN D3) 2000 UNITS CHEW Chew 1 tablet by mouth daily.  . citalopram (CELEXA) 20 MG tablet TAKE 1 TABLET DAILY  . ELIQUIS 2.5 MG TABS tablet TAKE  (1)  TABLET TWICE A DAY.  . furosemide (LASIX) 40 MG tablet TAKE  (1)  TABLET TWICE A DAY.  Marland Kitchen gabapentin (NEURONTIN) 300 MG capsule TAKE  (1)  CAPSULE  TWICE DAILY.  Marland Kitchen HYDROcodone-acetaminophen (NORCO) 10-325 MG per tablet   . levocetirizine (XYZAL) 5 MG tablet TAKE 1 TABLET DAILY AT BEDTIME  . omeprazole (PRILOSEC) 20 MG capsule Take 2 capsules daily forAcid reflux  . potassium chloride (K-DUR) 10  MEQ tablet TAKE (2) TABLETS DAILY  . rOPINIRole (REQUIP) 1 MG tablet TAKE 1 OR 2 TABLETS AT BEDTIME  . traMADol (ULTRAM) 50 MG tablet TAKE (1) TABLET DAILY AS NEEDED.  Marland Kitchen triamcinolone cream (KENALOG) 0.1 % Apply 1 application topically daily.  Marland Kitchen donepezil (ARICEPT) 10 MG tablet Take 1 tablet (10 mg total) by mouth at bedtime. (Patient not taking: Reported on 11/18/2014)     Review of Systems  Constitutional: Negative.   HENT: Negative.   Respiratory: Negative.   Cardiovascular: Negative.   Psychiatric/Behavioral: Negative.        Objective:   Physical Exam  Constitutional: She is oriented to person, place, and time. She appears well-developed and well-nourished.  HENT:  Head: Normocephalic and atraumatic.  Cardiovascular: Normal rate.   Pulmonary/Chest: Effort normal and breath sounds normal.  Neurological: She is alert and oriented to person, place, and time. Coordination normal.    BP 102/65 mmHg  Pulse 84  Temp(Src) 97.7 F (36.5 C) (Oral)  Ht 4\' 10"  (1.473 m)  Wt 195 lb (88.451 kg)  BMI 40.77 kg/m2       Assessment & Plan:  1. Hypertension BP controlled on meds  2. Osteoarthritis Continue same meds  Wardell Honour MD

## 2014-11-23 ENCOUNTER — Other Ambulatory Visit: Payer: Self-pay | Admitting: Family Medicine

## 2014-12-08 ENCOUNTER — Ambulatory Visit: Payer: Medicare Other

## 2014-12-08 ENCOUNTER — Other Ambulatory Visit: Payer: Self-pay | Admitting: Family Medicine

## 2014-12-09 NOTE — Telephone Encounter (Signed)
Last seen 11/18/14 Dr Sabra Heck  If approved print and route to nurse

## 2014-12-13 ENCOUNTER — Other Ambulatory Visit: Payer: Self-pay | Admitting: Family

## 2014-12-14 ENCOUNTER — Telehealth: Payer: Self-pay | Admitting: Family Medicine

## 2014-12-14 NOTE — Telephone Encounter (Signed)
Appt scheduled for 12/16/14 with Dr Sabra Heck. Patient aware.

## 2014-12-15 ENCOUNTER — Other Ambulatory Visit: Payer: Self-pay | Admitting: Family Medicine

## 2014-12-16 ENCOUNTER — Ambulatory Visit (INDEPENDENT_AMBULATORY_CARE_PROVIDER_SITE_OTHER): Payer: Medicare Other | Admitting: Family Medicine

## 2014-12-16 ENCOUNTER — Encounter: Payer: Self-pay | Admitting: Family Medicine

## 2014-12-16 VITALS — BP 146/65 | HR 85 | Temp 97.8°F | Ht <= 58 in | Wt 196.0 lb

## 2014-12-16 DIAGNOSIS — J449 Chronic obstructive pulmonary disease, unspecified: Secondary | ICD-10-CM

## 2014-12-16 DIAGNOSIS — R531 Weakness: Secondary | ICD-10-CM | POA: Diagnosis not present

## 2014-12-16 DIAGNOSIS — I10 Essential (primary) hypertension: Secondary | ICD-10-CM

## 2014-12-16 NOTE — Progress Notes (Signed)
Subjective:    Patient ID: [name] Walker, female    DOB: [birth year], 81 y.o.   MRN: [id number]  HPI  81 year old female here again desiring to have her driver's license reinstated. We have had several visits around this issue. Again I stated that I cannot medically find a reason why she should not drive other than being 89 but her mental status is fine her strength and coordination are fine her judgment seems appropriate. At 35 I'm pretty sure her reflexes are not as good as they used to be but hopefully her driving would be local such as to church grocery store etc. Apparently there is one page of the re-in statement form that I did not complete. That was done today and it includes diagnosis as such as DVT, restless leg syndrome, and lists her medicines. She is on no analgesics which would make her drowsy but she does take amitriptyline and Requip at night which if taken in the daytime could induce drowsiness.   Review of Systems  Constitutional: Negative.   HENT: Negative.   Eyes: Negative.   Respiratory: Negative.   Cardiovascular: Negative.   Gastrointestinal: Negative.   Genitourinary: Negative.   Neurological: Negative.   Psychiatric/Behavioral: Negative.    Patient Active Problem List   Diagnosis Date Noted  . Weakness 07/15/2013  . Cellulitis 03/02/2012  . ANEMIA, NORMOCYTIC 01/06/2009  . DEPRESSION 01/06/2009  . Essential hypertension 01/06/2009  . COPD 01/06/2009  . GERD 01/06/2009  . IRRITABLE BOWEL SYNDROME 01/06/2009   Outpatient Encounter Prescriptions as of 12/16/2014  Medication Sig  . amitriptyline (ELAVIL) 75 MG tablet TAKE 1 TABLETS AT BEDTIME  . amLODipine (NORVASC) 2.5 MG tablet TAKE 1 TABLET IN MORNING  . Cholecalciferol (VITAMIN D3) 2000 UNITS CHEW Chew 1 tablet by mouth daily.  . citalopram (CELEXA) 20 MG tablet TAKE 1 TABLET DAILY  . ELIQUIS 2.5 MG TABS tablet TAKE  (1)  TABLET TWICE A DAY.  . furosemide (LASIX) 40 MG tablet TAKE  (1)  TABLET TWICE  A DAY.  Marland Kitchen gabapentin (NEURONTIN) 300 MG capsule TAKE (1) CAPSULE TWICE DAILY.  Marland Kitchen levocetirizine (XYZAL) 5 MG tablet TAKE 1 TABLET DAILY AT BEDTIME  . omeprazole (PRILOSEC) 20 MG capsule Take 2 capsules daily forAcid reflux  . potassium chloride (K-DUR,KLOR-CON) 10 MEQ tablet TAKE (2) TABLETS DAILY  . rOPINIRole (REQUIP) 1 MG tablet TAKE 1 OR 2 TABLETS AT BEDTIME  . traMADol (ULTRAM) 50 MG tablet TAKE (1) TABLET DAILY AS NEEDED.  Marland Kitchen triamcinolone cream (KENALOG) 0.1 % Apply 1 application topically daily.  . [DISCONTINUED] HYDROcodone-acetaminophen (NORCO) 10-325 MG per tablet   . donepezil (ARICEPT) 10 MG tablet Take 1 tablet (10 mg total) by mouth at bedtime. (Patient not taking: Reported on 11/18/2014)  . [DISCONTINUED] potassium chloride (K-DUR) 10 MEQ tablet TAKE (2) TABLETS DAILY      Objective:   Physical Exam  Constitutional: She is oriented to person, place, and time. She appears well-developed and well-nourished.  HENT:  Head: Normocephalic.  Cardiovascular: Normal rate and regular rhythm.   Pulmonary/Chest: Effort normal and breath sounds normal.  Musculoskeletal: Normal range of motion.  Neurological: She is alert and oriented to person, place, and time. She has normal reflexes.  Psychiatric: She has a normal mood and affect. Her behavior is normal. Judgment and thought content normal.          Assessment & Plan:  1. Essential hypertension Blood pressure is controlled on current regimen no changes are recommended  2. Weakness Muscle strength was tested in all extremities today and I see no evidence of weakness and I would grade this strength throughout at 4 over 5. I observed her walking with the aid of her walker and she seemed stable on level ground.  3. Chronic obstructive pulmonary disease, unspecified COPD, unspecified chronic bronchitis type There is no shortness of breath and she talks easily without dyspnea  Wardell Honour MD

## 2014-12-30 ENCOUNTER — Telehealth: Payer: Self-pay | Admitting: Family Medicine

## 2014-12-31 NOTE — Telephone Encounter (Signed)
Good for her, I hope

## 2015-01-04 ENCOUNTER — Other Ambulatory Visit: Payer: Self-pay | Admitting: Family

## 2015-01-05 ENCOUNTER — Telehealth: Payer: Self-pay | Admitting: Family

## 2015-01-05 ENCOUNTER — Other Ambulatory Visit: Payer: Self-pay | Admitting: Family Medicine

## 2015-01-05 NOTE — Telephone Encounter (Signed)
Patient last seen in 11/2014

## 2015-01-06 NOTE — Telephone Encounter (Signed)
Last seen 12/16/14 Dr Sabra Heck   If approved print

## 2015-01-07 ENCOUNTER — Telehealth: Payer: Self-pay | Admitting: *Deleted

## 2015-01-07 NOTE — Telephone Encounter (Signed)
Patient doesn't think she needs aricept or celexa. She would like to d/c. Please advise pt. And pharmacy

## 2015-01-07 NOTE — Telephone Encounter (Signed)
Script for tramadol called in.

## 2015-01-11 NOTE — Telephone Encounter (Signed)
Okay to d/c both

## 2015-01-12 ENCOUNTER — Other Ambulatory Visit: Payer: Self-pay | Admitting: Family

## 2015-01-12 NOTE — Telephone Encounter (Signed)
Pt aware that she can stop taking both aricept & celexa

## 2015-02-03 ENCOUNTER — Other Ambulatory Visit: Payer: Self-pay | Admitting: Family Medicine

## 2015-02-03 NOTE — Telephone Encounter (Signed)
RX ready for pick up 

## 2015-02-03 NOTE — Telephone Encounter (Signed)
Last filled 01/07/15, last seen 12/23/14. Have nurse call into [facility], because they fix her pill box

## 2015-02-03 NOTE — Telephone Encounter (Signed)
Refill called to pharmacy.

## 2015-02-08 ENCOUNTER — Other Ambulatory Visit: Payer: Self-pay | Admitting: Family

## 2015-02-15 ENCOUNTER — Other Ambulatory Visit: Payer: Self-pay | Admitting: Family

## 2015-02-23 ENCOUNTER — Other Ambulatory Visit: Payer: Self-pay | Admitting: Family Medicine

## 2015-03-01 ENCOUNTER — Other Ambulatory Visit: Payer: Self-pay | Admitting: Family Medicine

## 2015-03-03 ENCOUNTER — Telehealth: Payer: Self-pay

## 2015-03-03 ENCOUNTER — Ambulatory Visit: Payer: Medicare Other | Admitting: Family Medicine

## 2015-03-03 NOTE — Telephone Encounter (Signed)
Eliquis 2.5 mg BID prior authorized 03/03/15 to 09/02/15

## 2015-03-09 ENCOUNTER — Encounter: Payer: Self-pay | Admitting: Family Medicine

## 2015-03-09 ENCOUNTER — Ambulatory Visit (INDEPENDENT_AMBULATORY_CARE_PROVIDER_SITE_OTHER): Payer: Medicare Other | Admitting: Family Medicine

## 2015-03-09 VITALS — BP 119/64 | HR 81 | Temp 97.7°F | Ht <= 58 in | Wt 188.0 lb

## 2015-03-09 DIAGNOSIS — R220 Localized swelling, mass and lump, head: Secondary | ICD-10-CM | POA: Diagnosis not present

## 2015-03-09 DIAGNOSIS — L03119 Cellulitis of unspecified part of limb: Secondary | ICD-10-CM | POA: Diagnosis not present

## 2015-03-09 DIAGNOSIS — M278 Other specified diseases of jaws: Secondary | ICD-10-CM

## 2015-03-09 MED ORDER — BETAMETHASONE SOD PHOS & ACET 6 (3-3) MG/ML IJ SUSP
6.0000 mg | Freq: Once | INTRAMUSCULAR | Status: AC
  Administered 2015-03-09: 6 mg via INTRAMUSCULAR

## 2015-03-09 MED ORDER — CIPROFLOXACIN HCL 250 MG PO TABS: 250.0000 mg | ORAL_TABLET | Freq: Two times a day (BID) | ORAL | Status: DC

## 2015-03-09 NOTE — Progress Notes (Addendum)
Subjective:  Patient ID: [name] Walker, female    DOB: December 06, 1924  Age: 81 y.o. MRN: [id number]  CC: Insect Bite   HPI [name] presents for insect bite yesterday and thinks it was probably a wasp or a honeybee. She felt a pop and sudden pain at the base of the fifth finger of the right hand at the dorsal aspect. This was accompanied by increasing swelling and pain. Her daughter made a mark on the forearm at the place where the swelling stopped by bedtime last night. The patient applied ice and elevated it with some relief. She denies any palpitations shortness of breath, no mouth or Tongue swelling or any agitation or tremor.  History Vala has a past medical history of DVT (deep venous thrombosis); Hypertension; COPD (chronic obstructive pulmonary disease); GERD (gastroesophageal reflux disease); Osteoarthritis; Ischemic colitis; Depression; Gastric erosion; Insomnia; and Cataract.   She has past surgical history that includes Total hip arthroplasty; Knee Arthroplasty; Esophagogastroduodenoscopy (10/17/2007); Colonoscopy; Back surgery; Abdominal hysterectomy; Cholecystectomy; and Cataracts.   Her family history includes Rheum arthritis in her mother; Ulcers in her father and mother.She reports that she has never smoked. She has never used smokeless tobacco. She reports that she does not drink alcohol or use illicit drugs.  Outpatient Prescriptions Prior to Visit  Medication Sig Dispense Refill  . amitriptyline (ELAVIL) 75 MG tablet TAKE 1 TABLETS AT BEDTIME 30 tablet 0  . Cholecalciferol (VITAMIN D3) 2000 UNITS CHEW Chew 1 tablet by mouth daily.    . traMADol (ULTRAM) 50 MG tablet TAKE 1 TABLET DAILY AS NEEDED 30 tablet 1  . triamcinolone cream (KENALOG) 0.1 % Apply 1 application topically daily. 30 g 1  . amLODipine (NORVASC) 2.5 MG tablet TAKE 1 TABLET IN MORNING 30 tablet 4  . citalopram (CELEXA) 20 MG tablet TAKE 1 TABLET DAILY 30 tablet 2  . donepezil (ARICEPT) 10 MG  tablet Take 1 tablet (10 mg total) by mouth at bedtime. 30 tablet 1  . ELIQUIS 2.5 MG TABS tablet TAKE  (1)  TABLET TWICE A DAY. 60 tablet 2  . furosemide (LASIX) 40 MG tablet TAKE  (1)  TABLET TWICE A DAY. 60 tablet 6  . gabapentin (NEURONTIN) 300 MG capsule TAKE (1) CAPSULE TWICE DAILY. 60 capsule 3  . levocetirizine (XYZAL) 5 MG tablet TAKE 1 TABLET DAILY AT BEDTIME 30 tablet 3  . omeprazole (PRILOSEC) 20 MG capsule TAKE 2 CAPSULES DAILY AS NEEDED FOR REFLUX 60 capsule 3  . potassium chloride (K-DUR,KLOR-CON) 10 MEQ tablet TAKE (2) TABLETS DAILY 60 tablet 4  . rOPINIRole (REQUIP) 1 MG tablet TAKE 1 OR 2 TABLETS AT BEDTIME 60 tablet 3   No facility-administered medications prior to visit.    ROS Review of Systems  Constitutional: Negative for fever, chills, diaphoresis, appetite change and fatigue.  HENT: Negative for congestion, ear pain, hearing loss, postnasal drip, rhinorrhea, sore throat and trouble swallowing.   Respiratory: Negative for cough, chest tightness and shortness of breath.   Cardiovascular: Negative for chest pain and palpitations.  Gastrointestinal: Negative for abdominal pain.  Musculoskeletal: Negative for arthralgias.  Skin: Positive for rash.    Objective:  BP 119/64 mmHg  Pulse 81  Temp(Src) 97.7 F (36.5 C) (Oral)  Ht 4\' 10"  (1.473 m)  Wt 188 lb (85.276 kg)  BMI 39.30 kg/m2  BP Readings from Last 3 Encounters:  03/09/15 119/64  12/16/14 146/65  11/18/14 102/65    Wt Readings from Last 3 Encounters:  03/09/15 188  lb (85.276 kg)  12/16/14 196 lb (88.905 kg)  11/18/14 195 lb (88.451 kg)     Physical Exam  Constitutional: She is oriented to person, place, and time. She appears well-developed and well-nourished. No distress.  HENT:  Head: Normocephalic and atraumatic.    Right Ear: External ear normal.  Left Ear: External ear normal.  Nose: Nose normal.  Mouth/Throat: Oropharynx is clear and moist.  3 cm raised, indurated lesion at left  angle of the mandible  Eyes: Conjunctivae and EOM are normal. Pupils are equal, round, and reactive to light.  Neck: Normal range of motion. Neck supple.  Cardiovascular: Normal rate, regular rhythm and normal heart sounds.   No murmur heard. Pulmonary/Chest: Effort normal and breath sounds normal. No respiratory distress. She has no wheezes. She has no rales.  Abdominal: Soft. Bowel sounds are normal. She exhibits no distension. There is no tenderness.  Musculoskeletal: She exhibits edema (:3+ edema and tenderness of the entire right hand up to the mid forearm.).  Neurological: She is alert and oriented to person, place, and time. She has normal reflexes.  Skin: Skin is warm and dry.  Psychiatric: She has a normal mood and affect. Her behavior is normal. Judgment and thought content normal.    No results found for: HGBA1C    No results found.  Assessment & Plan:   Linn was seen today for insect bite.  Diagnoses and all orders for this visit:  Cellulitis of hand Orders: -     betamethasone acetate-betamethasone sodium phosphate (CELESTONE) injection 6 mg; Inject 1 mL (6 mg total) into the muscle once.  Jaw mass Orders: -     Ambulatory referral to ENT  Other orders -     ciprofloxacin (CIPRO) 250 MG tablet; Take 1 tablet (250 mg total) by mouth 2 (two) times daily.   I am having Ms. Fawcett start on ciprofloxacin. I am also having her maintain her triamcinolone cream, Vitamin D3, traMADol, and amitriptyline. We administered betamethasone acetate-betamethasone sodium phosphate.  Meds ordered this encounter  Medications  . ciprofloxacin (CIPRO) 250 MG tablet    Sig: Take 1 tablet (250 mg total) by mouth 2 (two) times daily.    Dispense:  20 tablet    Refill:  0  . betamethasone acetate-betamethasone sodium phosphate (CELESTONE) injection 6 mg    Sig:      Follow-up: No Follow-up on file.  Claretta Fraise, M.D.

## 2015-03-10 ENCOUNTER — Other Ambulatory Visit: Payer: Self-pay

## 2015-03-10 ENCOUNTER — Telehealth: Payer: Self-pay | Admitting: Family Medicine

## 2015-03-10 DIAGNOSIS — R609 Edema, unspecified: Secondary | ICD-10-CM

## 2015-03-10 MED ORDER — POTASSIUM CHLORIDE CRYS ER 10 MEQ PO TBCR: EXTENDED_RELEASE_TABLET | ORAL | Status: DC

## 2015-03-10 MED ORDER — DONEPEZIL HCL 10 MG PO TABS: 10.0000 mg | ORAL_TABLET | Freq: Every day | ORAL | Status: DC

## 2015-03-10 MED ORDER — APIXABAN 2.5 MG PO TABS: ORAL_TABLET | ORAL | Status: DC

## 2015-03-10 MED ORDER — GABAPENTIN 300 MG PO CAPS: ORAL_CAPSULE | ORAL | Status: DC

## 2015-03-10 MED ORDER — OMEPRAZOLE 20 MG PO CPDR: DELAYED_RELEASE_CAPSULE | ORAL | Status: DC

## 2015-03-10 MED ORDER — AMLODIPINE BESYLATE 2.5 MG PO TABS: ORAL_TABLET | ORAL | Status: DC

## 2015-03-10 MED ORDER — FUROSEMIDE 40 MG PO TABS: ORAL_TABLET | ORAL | Status: DC

## 2015-03-10 MED ORDER — ROPINIROLE HCL 1 MG PO TABS
ORAL_TABLET | ORAL | Status: DC
Start: 2015-03-10 — End: 2015-05-29

## 2015-03-10 MED ORDER — CITALOPRAM HYDROBROMIDE 20 MG PO TABS: 20.0000 mg | ORAL_TABLET | Freq: Every day | ORAL | Status: DC

## 2015-03-10 MED ORDER — LEVOCETIRIZINE DIHYDROCHLORIDE 5 MG PO TABS: 5.0000 mg | ORAL_TABLET | Freq: Every day | ORAL | Status: DC

## 2015-03-10 NOTE — Telephone Encounter (Signed)
Patient phone number is busy.  Spoke with [facility] pharmacist.  He needed ok to give generic on potassium.  Permission given.

## 2015-03-11 ENCOUNTER — Ambulatory Visit: Payer: Medicare Other | Admitting: Family Medicine

## 2015-03-15 ENCOUNTER — Other Ambulatory Visit: Payer: Self-pay | Admitting: Family Medicine

## 2015-03-15 ENCOUNTER — Other Ambulatory Visit: Payer: Self-pay

## 2015-03-15 MED ORDER — CITALOPRAM HYDROBROMIDE 20 MG PO TABS: 20.0000 mg | ORAL_TABLET | Freq: Every day | ORAL | Status: DC

## 2015-03-17 ENCOUNTER — Telehealth: Payer: Self-pay | Admitting: Family Medicine

## 2015-03-17 NOTE — Telephone Encounter (Signed)
Please advise 

## 2015-03-17 NOTE — Addendum Note (Signed)
Addended by: Claretta Fraise on: 03/17/2015 05:29 PM   Modules accepted: Orders

## 2015-03-22 ENCOUNTER — Telehealth: Payer: Self-pay | Admitting: Family Medicine

## 2015-03-22 NOTE — Telephone Encounter (Signed)
Patient has a nodule on her neck that is tender to palpation.  She would like Dr Livia Snellen to take a look at it next week.  Appt scheduled for 04/01/15. Patient aware.

## 2015-04-01 ENCOUNTER — Encounter: Payer: Self-pay | Admitting: Family Medicine

## 2015-04-01 ENCOUNTER — Ambulatory Visit (INDEPENDENT_AMBULATORY_CARE_PROVIDER_SITE_OTHER): Payer: Medicare Other | Admitting: Family Medicine

## 2015-04-01 VITALS — BP 139/59 | HR 75 | Temp 97.4°F | Ht <= 58 in | Wt 187.0 lb

## 2015-04-01 DIAGNOSIS — L720 Epidermal cyst: Secondary | ICD-10-CM

## 2015-04-01 NOTE — Progress Notes (Signed)
Subjective:  Patient ID: [name] Walker, female    DOB: 01-06-1925  Age: 81 y.o. MRN: [id number]  CC: Cyst   HPI [name] Walker presents for cyst of the left jawline has continued to grow. It is nonpainful. It has been present for many months to years. It has not been draining. It has not become infected to her knowledge. She would like to have it removed.  History [name] Walker has a past medical history of DVT (deep venous thrombosis); Hypertension; COPD (chronic obstructive pulmonary disease); GERD (gastroesophageal reflux disease); Osteoarthritis; Ischemic colitis; Depression; Gastric erosion; Insomnia; and Cataract.   She has past surgical history that includes Total hip arthroplasty; Knee Arthroplasty; Esophagogastroduodenoscopy (10/17/2007); Colonoscopy; Back surgery; Abdominal hysterectomy; Cholecystectomy; and Cataracts.   Her family history includes Rheum arthritis in her mother; Ulcers in her father and mother.She reports that she has never smoked. She has never used smokeless tobacco. She reports that she does not drink alcohol or use illicit drugs.  Outpatient Prescriptions Prior to Visit  Medication Sig Dispense Refill  . amitriptyline (ELAVIL) 75 MG tablet TAKE 1 TABLETS AT BEDTIME 30 tablet 0  . amLODipine (NORVASC) 2.5 MG tablet TAKE 1 TABLET IN MORNING 90 tablet 1  . apixaban (ELIQUIS) 2.5 MG TABS tablet TAKE  (1)  TABLET TWICE A DAY. 180 tablet 1  . Cholecalciferol (VITAMIN D3) 2000 UNITS CHEW Chew 1 tablet by mouth daily.    . citalopram (CELEXA) 20 MG tablet Take 1 tablet (20 mg total) by mouth daily. 90 tablet 0  . donepezil (ARICEPT) 10 MG tablet Take 1 tablet (10 mg total) by mouth at bedtime. 90 tablet 0  . furosemide (LASIX) 40 MG tablet TAKE  (1)  TABLET TWICE A DAY. 180 tablet 1  . gabapentin (NEURONTIN) 300 MG capsule TAKE (1) CAPSULE TWICE DAILY. 180 capsule 0  . levocetirizine (XYZAL) 5 MG tablet Take 1 tablet (5 mg total) by mouth at bedtime. 90 tablet 1    . omeprazole (PRILOSEC) 20 MG capsule TAKE 2 CAPSULES DAILY AS NEEDED FOR REFLUX 180 capsule 1  . potassium chloride (K-DUR,KLOR-CON) 10 MEQ tablet TAKE (2) TABLETS DAILY 180 tablet 1  . rOPINIRole (REQUIP) 1 MG tablet TAKE 1 OR 2 TABLETS AT BEDTIME 180 tablet 0  . traMADol (ULTRAM) 50 MG tablet TAKE 1 TABLET DAILY AS NEEDED 30 tablet 1  . triamcinolone cream (KENALOG) 0.1 % Apply 1 application topically daily. 30 g 1  . ciprofloxacin (CIPRO) 250 MG tablet Take 1 tablet (250 mg total) by mouth 2 (two) times daily. (Patient not taking: Reported on 04/01/2015) 20 tablet 0   No facility-administered medications prior to visit.    ROS Review of Systems  Constitutional: Negative.   HENT: Negative.   Eyes: Negative.   Respiratory: Negative.   Cardiovascular: Negative.     Objective:  BP 139/59 mmHg  Pulse 75  Temp(Src) 97.4 F (36.3 C) (Oral)  Ht 4\' 10"  (1.473 m)  Wt 187 lb (84.823 kg)  BMI 39.09 kg/m2  BP Readings from Last 3 Encounters:  04/01/15 139/59  03/09/15 119/64  12/16/14 146/65    Wt Readings from Last 3 Encounters:  04/01/15 187 lb (84.823 kg)  03/09/15 188 lb (85.276 kg)  12/16/14 196 lb (88.905 kg)     Physical Exam  Constitutional: She appears well-developed.  HENT:  Head: Normocephalic.  Eyes: EOM are normal. Pupils are equal, round, and reactive to light.  Neck: Normal range of motion. Neck supple. No tracheal deviation  present.  There is a 3 cm raised nodule at the left angle of the mandible. This is freely movable under the skin. It is nonfluctuant. it is rubbery in consistency    No results found for: HGBA1C    No results found.  Assessment & Plan:   [name] Walker was seen today for cyst.  Diagnoses and all orders for this visit:  Epidermoid cyst of neck  I have discontinued [name] Walker's ciprofloxacin. I am also having her maintain her triamcinolone cream, Vitamin D3, traMADol, amitriptyline, rOPINIRole, omeprazole, donepezil, levocetirizine,  apixaban, furosemide, gabapentin, potassium chloride, amLODipine, and citalopram.  No orders of the defined types were placed in this encounter.     Follow-up: Return if symptoms worsen or fail to improve.  Claretta Fraise, M.D.

## 2015-04-06 ENCOUNTER — Other Ambulatory Visit: Payer: Self-pay | Admitting: Family Medicine

## 2015-04-06 NOTE — Telephone Encounter (Signed)
Last seen 04/01/15  Dr Livia Snellen  If approved print

## 2015-04-11 DIAGNOSIS — D37030 Neoplasm of uncertain behavior of the parotid salivary glands: Secondary | ICD-10-CM | POA: Diagnosis not present

## 2015-04-12 ENCOUNTER — Other Ambulatory Visit (INDEPENDENT_AMBULATORY_CARE_PROVIDER_SITE_OTHER): Payer: Self-pay | Admitting: Otolaryngology

## 2015-04-12 DIAGNOSIS — D37030 Neoplasm of uncertain behavior of the parotid salivary glands: Secondary | ICD-10-CM

## 2015-04-18 ENCOUNTER — Other Ambulatory Visit (INDEPENDENT_AMBULATORY_CARE_PROVIDER_SITE_OTHER): Payer: Self-pay | Admitting: Otolaryngology

## 2015-04-18 ENCOUNTER — Ambulatory Visit (HOSPITAL_COMMUNITY)
Admission: RE | Admit: 2015-04-18 | Discharge: 2015-04-18 | Disposition: A | Discharge status: Home / Self Care | Payer: Medicare Other | Source: Ambulatory Visit | Attending: Otolaryngology | Admitting: Otolaryngology

## 2015-04-18 DIAGNOSIS — D37030 Neoplasm of uncertain behavior of the parotid salivary glands: Secondary | ICD-10-CM

## 2015-04-18 DIAGNOSIS — R221 Localized swelling, mass and lump, neck: Secondary | ICD-10-CM | POA: Diagnosis not present

## 2015-04-25 DIAGNOSIS — D37030 Neoplasm of uncertain behavior of the parotid salivary glands: Secondary | ICD-10-CM | POA: Diagnosis not present

## 2015-04-26 ENCOUNTER — Other Ambulatory Visit: Payer: Self-pay | Admitting: Otolaryngology

## 2015-04-28 ENCOUNTER — Telehealth: Payer: Self-pay | Admitting: Family Medicine

## 2015-04-29 NOTE — Telephone Encounter (Signed)
She should stop the Eliquis one week ahead of time.

## 2015-04-29 NOTE — Telephone Encounter (Signed)
Pt is having parotidectomy on 8/23 When should she stop Eloquis Please advise

## 2015-05-02 NOTE — Telephone Encounter (Signed)
Pt notified of Dr Livia Snellen recommendation Verbalizes understanding

## 2015-05-04 ENCOUNTER — Telehealth: Payer: Self-pay | Admitting: Family Medicine

## 2015-05-04 NOTE — Telephone Encounter (Signed)
Spoke with pt regarding recommendation Verbalizes understanding

## 2015-05-11 ENCOUNTER — Other Ambulatory Visit: Payer: Self-pay | Admitting: Family Medicine

## 2015-05-11 NOTE — Telephone Encounter (Signed)
Last seen 04/01/15  Dr Livia Snellen   If approved print

## 2015-05-13 ENCOUNTER — Ambulatory Visit (INDEPENDENT_AMBULATORY_CARE_PROVIDER_SITE_OTHER): Payer: Medicare Other | Admitting: Pharmacist

## 2015-05-13 ENCOUNTER — Ambulatory Visit (INDEPENDENT_AMBULATORY_CARE_PROVIDER_SITE_OTHER): Payer: Medicare Other

## 2015-05-13 ENCOUNTER — Encounter: Payer: Self-pay | Admitting: Pharmacist

## 2015-05-13 ENCOUNTER — Other Ambulatory Visit: Payer: Self-pay | Admitting: Family Medicine

## 2015-05-13 VITALS — BP 136/64 | HR 70 | Ht <= 58 in | Wt 179.0 lb

## 2015-05-13 DIAGNOSIS — E785 Hyperlipidemia, unspecified: Secondary | ICD-10-CM

## 2015-05-13 DIAGNOSIS — Z780 Asymptomatic menopausal state: Secondary | ICD-10-CM

## 2015-05-13 DIAGNOSIS — I10 Essential (primary) hypertension: Secondary | ICD-10-CM

## 2015-05-13 DIAGNOSIS — Z0000 Encounter for general adult medical examination without abnormal findings: Secondary | ICD-10-CM | POA: Diagnosis not present

## 2015-05-13 DIAGNOSIS — Z13820 Encounter for screening for osteoporosis: Secondary | ICD-10-CM | POA: Diagnosis not present

## 2015-05-13 DIAGNOSIS — M8580 Other specified disorders of bone density and structure, unspecified site: Secondary | ICD-10-CM

## 2015-05-13 MED ORDER — CALCIUM CITRATE-VITAMIN D 250-100 MG-UNIT PO TABS: 2.0000 | ORAL_TABLET | Freq: Every day | ORAL | Status: DC

## 2015-05-13 NOTE — Progress Notes (Signed)
Patient ID: [name] Walker, female   DOB: 12-08-1924, 81 y.o.   MRN: [id number]     Subjective:   [name] Walker is a 81 y.o. female who presents for an Subsequent Medicare Annual Wellness Visit.  [name] biggest health concern at the moment is up coming parotidectomy which is scheduled for 05/17/2015.  She has had condition for several months and although she is ready to have surgery she is also a little nervous.  She also requests labs to be checked today.    Current Medications (verified) Outpatient Encounter Prescriptions as of 05/13/2015  Medication Sig  . amitriptyline (ELAVIL) 75 MG tablet TAKE 1 TABLETS AT BEDTIME  . amLODipine (NORVASC) 2.5 MG tablet TAKE 1 TABLET IN MORNING  . apixaban (ELIQUIS) 2.5 MG TABS tablet TAKE  (1)  TABLET TWICE A DAY.  Marland Kitchen Cholecalciferol (VITAMIN D3) 2000 UNITS CHEW Chew 1 tablet by mouth daily.  . citalopram (CELEXA) 20 MG tablet Take 1 tablet (20 mg total) by mouth daily.  Marland Kitchen donepezil (ARICEPT) 10 MG tablet Take 1 tablet (10 mg total) by mouth at bedtime.  . furosemide (LASIX) 40 MG tablet TAKE  (1)  TABLET TWICE A DAY.  Marland Kitchen gabapentin (NEURONTIN) 300 MG capsule TAKE (1) CAPSULE TWICE DAILY.  Marland Kitchen levocetirizine (XYZAL) 5 MG tablet Take 1 tablet (5 mg total) by mouth at bedtime.  Marland Kitchen omeprazole (PRILOSEC) 20 MG capsule TAKE 2 CAPSULES DAILY AS NEEDED FOR REFLUX  . potassium chloride (K-DUR,KLOR-CON) 10 MEQ tablet TAKE (2) TABLETS DAILY  . rOPINIRole (REQUIP) 1 MG tablet TAKE 1 OR 2 TABLETS AT BEDTIME  . traMADol (ULTRAM) 50 MG tablet TAKE (1) TABLET DAILY AS NEEDED.  Marland Kitchen triamcinolone cream (KENALOG) 0.1 % Apply 1 application topically daily.   No facility-administered encounter medications on file as of 05/13/2015.    Allergies (verified) Iohexol; Latex; Penicillins; and Shellfish allergy   History: Past Medical History  Diagnosis Date  . DVT (deep venous thrombosis)   . Hypertension   . COPD (chronic obstructive pulmonary disease)     . GERD (gastroesophageal reflux disease)   . Osteoarthritis   . Ischemic colitis   . Depression   . Gastric erosion   . Insomnia   . Cataract   . Parotid mass 2016   Past Surgical History  Procedure Laterality Date  . Total hip arthroplasty      bilateral  . Knee arthroplasty      bilateral  . Esophagogastroduodenoscopy  10/17/2007    erosion   . Colonoscopy    . Back surgery    . Abdominal hysterectomy    . Cholecystectomy    . Cataracts     Family History  Problem Relation Age of Onset  . Rheum arthritis Mother   . Ulcers Mother     on foot  . Ulcers Father   . Cancer Sister     uterine   Social History   Occupational History  . Not on file.   Social History Main Topics  . Smoking status: Never Smoker   . Smokeless tobacco: Never Used  . Alcohol Use: No  . Drug Use: No  . Sexual Activity: No    Do you feel safe at home?  Yes  Dietary issues and exercise activities: Current Exercise Habits:: The patient does not participate in regular exercise at present  Current Dietary habits:  Patient has been limiting serving sizes over the last 2 months and has lost about 8#  Objective:  Today's Vitals   05/13/15 1539  BP: 136/64  Pulse: 70  Height: _0  (1.473 m)  Weight: 179 lb (81.194 kg)   Body mass index is 37.42 kg/(m^2).   BMD results Could only check radius of left forearm.  Patient has hip replacement bilaterally and history of back surgery.  T-Score = -2.4  Activities of Daily Living In your present state of health, do you have any difficulty performing the following activities: 05/13/2015  Hearing? N  Vision? N  Difficulty concentrating or making decisions? N  Walking or climbing stairs? Y  Dressing or bathing? N  Doing errands, shopping? N  Preparing Food and eating ? N  Using the Toilet? N  In the past six months, have you accidently leaked urine? N  Do you have problems with loss of bowel control? N  Managing your Medications? N   Managing your Finances? N  Housekeeping or managing your Housekeeping? N    Are there smokers in your home (other than you)? Yes - her children smoke in her home when they visit   Cardiac Risk Factors include: advanced age (>79mn, >>60women);dyslipidemia;hypertension;obesity (BMI >30kg/m2);sedentary lifestyle  Depression Screen PHQ 2/9 Scores 05/13/2015 05/13/2015 04/01/2015 05/07/2014  PHQ - 2 Score 0 0 0 0    Fall Risk Fall Risk  05/13/2015 04/01/2015 05/07/2014 12/15/2013  Falls in the past year? Yes Yes Yes Yes  Number falls in past yr: 2 or more 2 or more 1 1  Injury with Fall? No No No -  Risk Factor Category  High Fall Risk - - -  Risk for fall due to : - - History of fall(s);Impaired balance/gait;Impaired mobility;Medication side effect Other (Comment)  Risk for fall due to (comments): - - - Weakness , fell in bathroom with no injury.  Follow up Falls prevention discussed - - -    Cognitive Function: No flowsheet data found.  Immunizations and Health Maintenance Immunization History  Administered Date(s) Administered  . Influenza,inj,Quad PF,36+ Mos 07/15/2014  . Pneumococcal Conjugate-13 07/15/2014  . Tdap 05/07/2012  . Zoster 05/07/2014   Health Maintenance Due  Topic Date Due  . INFLUENZA VACCINE  04/25/2015    Patient Care Team: SWardell Honour MD as PCP - General (Family Medicine)  Indicate any recent Medical Services you may have received from other than [facility] providers in the past year (date may be approximate).    Assessment:    Annual Wellness Visit  Osteopenia   Screening Tests Health Maintenance  Topic Date Due  . INFLUENZA VACCINE  04/25/2015  . DEXA SCAN  07/02/2015 (Originally 116-Apr-1926  . PNA vac Low Risk Adult (2 of 2 - PPSV23) 07/16/2015  . TETANUS/TDAP  05/07/2022  . COLONOSCOPY  01/18/2024  . ZOSTAVAX  Completed      Plan:   During the course of the visit GTerilynnwas educated and counseled about the following appropriate screening  and preventive services:   Vaccines to include Pneumoccal, Influenza, Hepatitis B, Td, Zostavax - UTD  Colorectal cancer screening - FOBT given in office today.   Cardiovascular disease screening - last EKG 2014.  Due to recheck lipids - drawn today  Diabetes screening  = CMP checked today  Bone Denisty / Osteoporosis Screening - checked today.  Showed osteopenia.  Not candidate for Evista and decided against bisphosphonate at this time because of difficulty swallowing which is likely related to parotid mass.  If improves after surgery will reconsider oral bisphosphonate.   Start Calcium citrate 25105m  2 tablets daily.  Continue Boost 1 daily  Mammogram - patient declined  Glaucoma screening / Diabetic Eye Exam - UTD  Nutrition counseling - continue to limit caloric intake - has lost about 8# over the last 2 months by reducing intake  Increase activity.  Discussed chair exercise and handout given  Advanced Directives - discussed with patient and packet given Orders Placed This Encounter  Procedures  . CMP14+EGFR  . Lipid panel  . Thyroid Panel With TSH  . CBC with Differential/Platelet     Patient Instructions (the written plan) were given to the patient.   Cherre Robins, [facility]   05/13/2015

## 2015-05-13 NOTE — Patient Instructions (Addendum)
Ms. Ziehm , Thank you for taking time to come for your Medicare Wellness Visit. I appreciate your ongoing commitment to your health goals. Please review the following plan we discussed and let me know if I can assist you in the future.   These are the goals we discussed: Goals    . Exercise 5 times per week (30 min per time)     Try to do chair exercises from handout I gave you.    . Weight < 150 lb (68.04 kg)     Continue with dietary changes - your weight is improving.         This is a list of the screening recommended for you and due dates:  Health Maintenance  Topic Date Due  . Flu Shot  04/25/2015  . DEXA scan (bone density measurement)  07/02/2015*  . Pneumonia vaccines (2 of 2 - PPSV23) 07/16/2015  . Tetanus Vaccine  05/07/2022  . Colon Cancer Screening  01/18/2024  . Shingles Vaccine  Completed  *Topic was postponed. The date shown is not the original due date.    Fall Prevention and Home Safety Falls cause injuries and can affect all age groups. It is possible to use preventive measures to significantly decrease the likelihood of falls. There are many simple measures which can make your home safer and prevent falls. OUTDOORS  Repair cracks and edges of walkways and driveways.  Remove high doorway thresholds.  Trim shrubbery on the main path into your home.  Have good outside lighting.  Clear walkways of tools, rocks, debris, and clutter.  Check that handrails are not broken and are securely fastened. Both sides of steps should have handrails.  Have leaves, snow, and ice cleared regularly.  Use sand or salt on walkways during winter months.  In the garage, clean up grease or oil spills. BATHROOM  Install night lights.  Install grab bars by the toilet and in the tub and shower.  Use non-skid mats or decals in the tub or shower.  Place a plastic non-slip stool in the shower to sit on, if needed.  Keep floors dry and clean up all water on the floor  immediately.  Remove soap buildup in the tub or shower on a regular basis.  Secure bath mats with non-slip, double-sided rug tape.  Remove throw rugs and tripping hazards from the floors. BEDROOMS  Install night lights.  Make sure a bedside light is easy to reach.  Do not use oversized bedding.  Keep a telephone by your bedside.  Have a firm chair with side arms to use for getting dressed.  Remove throw rugs and tripping hazards from the floor. KITCHEN  Keep handles on pots and pans turned toward the center of the stove. Use back burners when possible.  Clean up spills quickly and allow time for drying.  Avoid walking on wet floors.  Avoid hot utensils and knives.  Position shelves so they are not too high or low.  Place commonly used objects within easy reach.  If necessary, use a sturdy step stool with a grab bar when reaching.  Keep electrical cables out of the way.  Do not use floor polish or wax that makes floors slippery. If you must use wax, use non-skid floor wax.  Remove throw rugs and tripping hazards from the floor. STAIRWAYS  Never leave objects on stairs.  Place handrails on both sides of stairways and use them. Fix any loose handrails. Make sure handrails on both sides of  the stairways are as long as the stairs.  Check carpeting to make sure it is firmly attached along stairs. Make repairs to worn or loose carpet promptly.  Avoid placing throw rugs at the top or bottom of stairways, or properly secure the rug with carpet tape to prevent slippage. Get rid of throw rugs, if possible.  Have an electrician put in a light switch at the top and bottom of the stairs. OTHER FALL PREVENTION TIPS  Wear low-heel or rubber-soled shoes that are supportive and fit well. Wear closed toe shoes.  When using a stepladder, make sure it is fully opened and both spreaders are firmly locked. Do not climb a closed stepladder.  Add color or contrast paint or tape to  grab bars and handrails in your home. Place contrasting color strips on first and last steps.  Learn and use mobility aids as needed. Install an electrical emergency response system.  Turn on lights to avoid dark areas. Replace light bulbs that burn out immediately. Get light switches that glow.  Arrange furniture to create clear pathways. Keep furniture in the same place.  Firmly attach carpet with non-skid or double-sided tape.  Eliminate uneven floor surfaces.  Select a carpet pattern that does not visually hide the edge of steps.  Be aware of all pets. OTHER HOME SAFETY TIPS  Set the water temperature for 120 F (48.8 C).  Keep emergency numbers on or near the telephone.  Keep smoke detectors on every level of the home and near sleeping areas. Document Released: 08/31/2002 Document Revised: 03/11/2012 Document Reviewed: 11/30/2011 [facility] Patient Information 2015 [place], [place]. This information is not intended to replace advice given to you by your health care provider. Make sure you discuss any questions you have with your health care provider.

## 2015-05-14 LAB — CBC WITH DIFFERENTIAL/PLATELET
BASOS: 0 %
Basophils Absolute: 0 10*3/uL (ref 0.0–0.2)
EOS (ABSOLUTE): 0.3 10*3/uL (ref 0.0–0.4)
Eos: 3 %
Hematocrit: 34.9 % (ref 34.0–46.6)
Hemoglobin: 12.1 g/dL (ref 11.1–15.9)
Immature Grans (Abs): 0 10*3/uL (ref 0.0–0.1)
Immature Granulocytes: 0 %
LYMPHS ABS: 1.5 10*3/uL (ref 0.7–3.1)
Lymphs: 19 %
MCH: 32.3 pg (ref 26.6–33.0)
MCHC: 34.7 g/dL (ref 31.5–35.7)
MCV: 93 fL (ref 79–97)
MONOCYTES: 11 %
MONOS ABS: 0.9 10*3/uL (ref 0.1–0.9)
Neutrophils Absolute: 5.2 10*3/uL (ref 1.4–7.0)
Neutrophils: 67 %
Platelets: 295 10*3/uL (ref 150–379)
RBC: 3.75 x10E6/uL — ABNORMAL LOW (ref 3.77–5.28)
RDW: 13.5 % (ref 12.3–15.4)
WBC: 7.9 10*3/uL (ref 3.4–10.8)

## 2015-05-14 LAB — CMP14+EGFR
ALBUMIN: 4 g/dL (ref 3.5–4.7)
ALT: 11 IU/L (ref 0–32)
AST: 15 IU/L (ref 0–40)
Albumin/Globulin Ratio: 1.4 (ref 1.1–2.5)
Alkaline Phosphatase: 115 IU/L (ref 39–117)
BILIRUBIN TOTAL: 0.4 mg/dL (ref 0.0–1.2)
BUN / CREAT RATIO: 11 (ref 11–26)
BUN: 12 mg/dL (ref 8–27)
CO2: 28 mmol/L (ref 18–29)
CREATININE: 1.09 mg/dL — AB (ref 0.57–1.00)
Calcium: 9.2 mg/dL (ref 8.7–10.3)
Chloride: 98 mmol/L (ref 97–108)
GFR calc Af Amer: 53 mL/min/{1.73_m2} — ABNORMAL LOW (ref >59)
GFR, EST NON AFRICAN AMERICAN: 46 mL/min/{1.73_m2} — AB (ref >59)
Globulin, Total: 2.8 g/dL (ref 1.5–4.5)
Glucose: 97 mg/dL (ref 65–99)
Potassium: 3.9 mmol/L (ref 3.5–5.2)
Sodium: 142 mmol/L (ref 134–144)
Total Protein: 6.8 g/dL (ref 6.0–8.5)

## 2015-05-14 LAB — THYROID PANEL WITH TSH
Free Thyroxine Index: 1.6 (ref 1.2–4.9)
T3 Uptake Ratio: 26 % (ref 24–39)
T4 TOTAL: 6.1 ug/dL (ref 4.5–12.0)
TSH: 1.87 u[IU]/mL (ref 0.450–4.500)

## 2015-05-14 LAB — LIPID PANEL
CHOL/HDL RATIO: 2.9 ratio (ref 0.0–4.4)
Cholesterol, Total: 180 mg/dL (ref 100–199)
HDL: 62 mg/dL (ref >39)
LDL CALC: 86 mg/dL (ref 0–99)
Triglycerides: 161 mg/dL — ABNORMAL HIGH (ref 0–149)
VLDL CHOLESTEROL CAL: 32 mg/dL (ref 5–40)

## 2015-05-16 ENCOUNTER — Encounter (HOSPITAL_BASED_OUTPATIENT_CLINIC_OR_DEPARTMENT_OTHER): Payer: Self-pay | Admitting: *Deleted

## 2015-05-16 ENCOUNTER — Other Ambulatory Visit: Payer: Medicare Other

## 2015-05-17 ENCOUNTER — Encounter (HOSPITAL_BASED_OUTPATIENT_CLINIC_OR_DEPARTMENT_OTHER)
Admission: RE | Disposition: A | Discharge status: Home / Self Care | Payer: Self-pay | Source: Ambulatory Visit | Attending: Otolaryngology

## 2015-05-17 ENCOUNTER — Ambulatory Visit (HOSPITAL_BASED_OUTPATIENT_CLINIC_OR_DEPARTMENT_OTHER): Payer: Medicare Other | Admitting: Anesthesiology

## 2015-05-17 ENCOUNTER — Ambulatory Visit (HOSPITAL_BASED_OUTPATIENT_CLINIC_OR_DEPARTMENT_OTHER)
Admission: RE | Admit: 2015-05-17 | Discharge: 2015-05-18 | Disposition: A | Discharge status: Home / Self Care | Payer: Medicare Other | Source: Ambulatory Visit | Attending: Otolaryngology | Admitting: Otolaryngology

## 2015-05-17 ENCOUNTER — Encounter (HOSPITAL_BASED_OUTPATIENT_CLINIC_OR_DEPARTMENT_OTHER): Payer: Self-pay | Admitting: *Deleted

## 2015-05-17 DIAGNOSIS — K559 Vascular disorder of intestine, unspecified: Secondary | ICD-10-CM | POA: Insufficient documentation

## 2015-05-17 DIAGNOSIS — H269 Unspecified cataract: Secondary | ICD-10-CM | POA: Diagnosis not present

## 2015-05-17 DIAGNOSIS — Z8261 Family history of arthritis: Secondary | ICD-10-CM | POA: Insufficient documentation

## 2015-05-17 DIAGNOSIS — F329 Major depressive disorder, single episode, unspecified: Secondary | ICD-10-CM | POA: Diagnosis not present

## 2015-05-17 DIAGNOSIS — Z7901 Long term (current) use of anticoagulants: Secondary | ICD-10-CM | POA: Insufficient documentation

## 2015-05-17 DIAGNOSIS — Z9049 Acquired absence of other specified parts of digestive tract: Secondary | ICD-10-CM

## 2015-05-17 DIAGNOSIS — K118 Other diseases of salivary glands: Secondary | ICD-10-CM | POA: Diagnosis not present

## 2015-05-17 DIAGNOSIS — J449 Chronic obstructive pulmonary disease, unspecified: Secondary | ICD-10-CM | POA: Diagnosis not present

## 2015-05-17 DIAGNOSIS — Z9849 Cataract extraction status, unspecified eye: Secondary | ICD-10-CM | POA: Diagnosis not present

## 2015-05-17 DIAGNOSIS — I10 Essential (primary) hypertension: Secondary | ICD-10-CM | POA: Insufficient documentation

## 2015-05-17 DIAGNOSIS — C7989 Secondary malignant neoplasm of other specified sites: Secondary | ICD-10-CM | POA: Diagnosis not present

## 2015-05-17 DIAGNOSIS — C07 Malignant neoplasm of parotid gland: Secondary | ICD-10-CM | POA: Diagnosis not present

## 2015-05-17 DIAGNOSIS — D37030 Neoplasm of uncertain behavior of the parotid salivary glands: Secondary | ICD-10-CM | POA: Diagnosis not present

## 2015-05-17 DIAGNOSIS — Z79899 Other long term (current) drug therapy: Secondary | ICD-10-CM | POA: Insufficient documentation

## 2015-05-17 DIAGNOSIS — Z9889 Other specified postprocedural states: Secondary | ICD-10-CM | POA: Insufficient documentation

## 2015-05-17 DIAGNOSIS — G4700 Insomnia, unspecified: Secondary | ICD-10-CM | POA: Diagnosis not present

## 2015-05-17 DIAGNOSIS — K219 Gastro-esophageal reflux disease without esophagitis: Secondary | ICD-10-CM | POA: Diagnosis not present

## 2015-05-17 DIAGNOSIS — C770 Secondary and unspecified malignant neoplasm of lymph nodes of head, face and neck: Secondary | ICD-10-CM | POA: Diagnosis not present

## 2015-05-17 HISTORY — PX: PAROTIDECTOMY: SHX2163

## 2015-05-17 LAB — POCT HEMOGLOBIN-HEMACUE: Hemoglobin: 12.2 g/dL (ref 12.0–15.0)

## 2015-05-17 SURGERY — EXCISION, PAROTID GLAND
Anesthesia: General | Site: Neck | Laterality: Left

## 2015-05-17 MED ORDER — CLINDAMYCIN PHOSPHATE 600 MG/50ML IV SOLN
INTRAVENOUS | Status: DC | PRN
  Administered 2015-05-17: 600 mg via INTRAVENOUS

## 2015-05-17 MED ORDER — SUCCINYLCHOLINE CHLORIDE 20 MG/ML IJ SOLN
INTRAMUSCULAR | Status: DC | PRN
  Administered 2015-05-17: 100 mg via INTRAVENOUS

## 2015-05-17 MED ORDER — OXYCODONE-ACETAMINOPHEN 5-325 MG PO TABS: 1.0000 | ORAL_TABLET | ORAL | Status: DC | PRN

## 2015-05-17 MED ORDER — MORPHINE SULFATE (PF) 2 MG/ML IV SOLN: 2.0000 mg | INTRAVENOUS | Status: DC | PRN

## 2015-05-17 MED ORDER — 0.9 % SODIUM CHLORIDE (POUR BTL) OPTIME
TOPICAL | Status: DC | PRN
  Administered 2015-05-17: 150 mL

## 2015-05-17 MED ORDER — LIDOCAINE-EPINEPHRINE 1 %-1:100000 IJ SOLN
INTRAMUSCULAR | Status: DC | PRN
  Administered 2015-05-17: 5.5 mL

## 2015-05-17 MED ORDER — MIDAZOLAM HCL 2 MG/2ML IJ SOLN: 1.0000 mg | INTRAMUSCULAR | Status: DC | PRN

## 2015-05-17 MED ORDER — CLINDAMYCIN HCL 300 MG PO CAPS: 300.0000 mg | ORAL_CAPSULE | Freq: Three times a day (TID) | ORAL | Status: DC

## 2015-05-17 MED ORDER — DONEPEZIL HCL 10 MG PO TABS: 10.0000 mg | ORAL_TABLET | Freq: Every day | ORAL | Status: DC

## 2015-05-17 MED ORDER — FENTANYL CITRATE (PF) 100 MCG/2ML IJ SOLN
25.0000 ug | INTRAMUSCULAR | Status: DC | PRN
Start: 2015-05-17 — End: 2015-05-18
  Administered 2015-05-17 (×2): 25 ug via INTRAVENOUS
  Administered 2015-05-17: 50 ug via INTRAVENOUS

## 2015-05-17 MED ORDER — LIDOCAINE HCL (CARDIAC) 20 MG/ML IV SOLN
INTRAVENOUS | Status: DC | PRN
  Administered 2015-05-17 (×2): 50 mg via INTRAVENOUS

## 2015-05-17 MED ORDER — AMITRIPTYLINE HCL 75 MG PO TABS: 75.0000 mg | ORAL_TABLET | Freq: Every day | ORAL | Status: DC

## 2015-05-17 MED ORDER — OXYCODONE-ACETAMINOPHEN 5-325 MG PO TABS
1.0000 | ORAL_TABLET | ORAL | Status: DC | PRN
  Administered 2015-05-17 (×2): 1 via ORAL
  Filled 2015-05-17 (×2): qty 1

## 2015-05-17 MED ORDER — FENTANYL CITRATE (PF) 100 MCG/2ML IJ SOLN
INTRAMUSCULAR | Status: AC
  Filled 2015-05-17: qty 4

## 2015-05-17 MED ORDER — ONDANSETRON HCL 4 MG/2ML IJ SOLN: 4.0000 mg | INTRAMUSCULAR | Status: DC | PRN

## 2015-05-17 MED ORDER — GLYCOPYRROLATE 0.2 MG/ML IJ SOLN: 0.2000 mg | Freq: Once | INTRAMUSCULAR | Status: DC | PRN

## 2015-05-17 MED ORDER — CLINDAMYCIN PHOSPHATE 600 MG/50ML IV SOLN
INTRAVENOUS | Status: AC
  Filled 2015-05-17: qty 50

## 2015-05-17 MED ORDER — FUROSEMIDE 40 MG PO TABS
40.0000 mg | ORAL_TABLET | Freq: Two times a day (BID) | ORAL | Status: DC
  Administered 2015-05-17: 40 mg via ORAL

## 2015-05-17 MED ORDER — FENTANYL CITRATE (PF) 100 MCG/2ML IJ SOLN
50.0000 ug | INTRAMUSCULAR | Status: DC | PRN
  Administered 2015-05-17: 50 ug via INTRAVENOUS

## 2015-05-17 MED ORDER — GABAPENTIN 300 MG PO CAPS
300.0000 mg | ORAL_CAPSULE | Freq: Two times a day (BID) | ORAL | Status: DC
  Administered 2015-05-17 (×2): 300 mg via ORAL

## 2015-05-17 MED ORDER — MEPERIDINE HCL 25 MG/ML IJ SOLN
6.2500 mg | INTRAMUSCULAR | Status: DC | PRN
Start: 2015-05-17 — End: 2015-05-18

## 2015-05-17 MED ORDER — FENTANYL CITRATE (PF) 100 MCG/2ML IJ SOLN
INTRAMUSCULAR | Status: AC
  Filled 2015-05-17: qty 2

## 2015-05-17 MED ORDER — LACTATED RINGERS IV SOLN
INTRAVENOUS | Status: DC
  Administered 2015-05-17: 09:00:00 via INTRAVENOUS

## 2015-05-17 MED ORDER — KCL IN DEXTROSE-NACL 20-5-0.45 MEQ/L-%-% IV SOLN
INTRAVENOUS | Status: DC
  Administered 2015-05-17: 15:00:00 via INTRAVENOUS
  Filled 2015-05-17: qty 1000

## 2015-05-17 MED ORDER — AMLODIPINE BESYLATE 2.5 MG PO TABS
2.5000 mg | ORAL_TABLET | Freq: Every day | ORAL | Status: DC
  Administered 2015-05-17: 2.5 mg via ORAL

## 2015-05-17 MED ORDER — CALCIUM CITRATE-VITAMIN D 250-100 MG-UNIT PO TABS
2.0000 | ORAL_TABLET | Freq: Every day | ORAL | Status: DC
  Administered 2015-05-17: 2 via ORAL

## 2015-05-17 MED ORDER — ONDANSETRON HCL 4 MG/2ML IJ SOLN
INTRAMUSCULAR | Status: DC | PRN
  Administered 2015-05-17: 4 mg via INTRAVENOUS

## 2015-05-17 MED ORDER — DEXAMETHASONE SODIUM PHOSPHATE 4 MG/ML IJ SOLN
INTRAMUSCULAR | Status: DC | PRN
  Administered 2015-05-17: 10 mg via INTRAVENOUS

## 2015-05-17 MED ORDER — ONDANSETRON HCL 4 MG PO TABS: 4.0000 mg | ORAL_TABLET | ORAL | Status: DC | PRN

## 2015-05-17 MED ORDER — SCOPOLAMINE 1 MG/3DAYS TD PT72: 1.0000 | MEDICATED_PATCH | Freq: Once | TRANSDERMAL | Status: DC | PRN

## 2015-05-17 MED ORDER — POTASSIUM CHLORIDE CRYS ER 20 MEQ PO TBCR
20.0000 meq | EXTENDED_RELEASE_TABLET | Freq: Every day | ORAL | Status: DC
  Administered 2015-05-17: 20 meq via ORAL

## 2015-05-17 MED ORDER — PROPOFOL 10 MG/ML IV BOLUS
INTRAVENOUS | Status: DC | PRN
  Administered 2015-05-17: 150 mg via INTRAVENOUS
  Administered 2015-05-17: 20 mg via INTRAVENOUS

## 2015-05-17 MED ORDER — CITALOPRAM HYDROBROMIDE 20 MG PO TABS
20.0000 mg | ORAL_TABLET | Freq: Every day | ORAL | Status: DC
  Administered 2015-05-17: 20 mg via ORAL

## 2015-05-17 MED ORDER — DEXTROSE 5 % IV SOLN
10.0000 mg | INTRAVENOUS | Status: DC | PRN
  Administered 2015-05-17: 50 ug/min via INTRAVENOUS

## 2015-05-17 MED ORDER — ROPINIROLE HCL 1 MG PO TABS
1.0000 mg | ORAL_TABLET | Freq: Every day | ORAL | Status: DC
  Administered 2015-05-17: 1 mg via ORAL

## 2015-05-17 SURGICAL SUPPLY — 58 items
APPLICATOR DR MATTHEWS STRL (MISCELLANEOUS) ×3 IMPLANT
ATTRACTOMAT 16X20 MAGNETIC DRP (DRAPES) ×3 IMPLANT
BLADE SURG 15 STRL LF DISP TIS (BLADE) ×1 IMPLANT
BLADE SURG 15 STRL SS (BLADE) ×2
CANISTER SUCT 1200ML W/VALVE (MISCELLANEOUS) ×3 IMPLANT
CORDS BIPOLAR (ELECTRODE) ×3 IMPLANT
COTTONBALL LRG STERILE PKG (GAUZE/BANDAGES/DRESSINGS) ×3 IMPLANT
COVER BACK TABLE 60X90IN (DRAPES) ×3 IMPLANT
COVER MAYO STAND STRL (DRAPES) ×3 IMPLANT
DECANTER SPIKE VIAL GLASS SM (MISCELLANEOUS) ×3 IMPLANT
DRAIN CHANNEL 10F 3/8 F FF (DRAIN) ×3 IMPLANT
DRAPE SURG 17X23 STRL (DRAPES) IMPLANT
DRAPE U-SHAPE 76X120 STRL (DRAPES) ×3 IMPLANT
ELECT COATED BLADE 2.86 ST (ELECTRODE) ×3 IMPLANT
ELECT PAIRED SUBDERMAL (MISCELLANEOUS) ×3
ELECT REM PT RETURN 9FT ADLT (ELECTROSURGICAL) ×3
ELECTRODE PAIRED SUBDERMAL (MISCELLANEOUS) ×1 IMPLANT
ELECTRODE REM PT RTRN 9FT ADLT (ELECTROSURGICAL) ×1 IMPLANT
EVACUATOR SILICONE 100CC (DRAIN) ×3 IMPLANT
FORCEPS BIPOLAR SPETZLER 8 1.0 (NEUROSURGERY SUPPLIES) ×3 IMPLANT
GAUZE SPONGE 4X4 16PLY XRAY LF (GAUZE/BANDAGES/DRESSINGS) ×6 IMPLANT
GLOVE BIO SURGEON STRL SZ 6.5 (GLOVE) IMPLANT
GLOVE BIO SURGEON STRL SZ7.5 (GLOVE) IMPLANT
GLOVE BIO SURGEONS STRL SZ 6.5 (GLOVE)
GLOVE SURG SS PI 6.5 STRL IVOR (GLOVE) ×3 IMPLANT
GLOVE SURG SS PI 7.0 STRL IVOR (GLOVE) ×6 IMPLANT
GLOVE SURG SS PI 7.5 STRL IVOR (GLOVE) ×3 IMPLANT
GOWN STRL REUS W/ TWL LRG LVL3 (GOWN DISPOSABLE) ×3 IMPLANT
GOWN STRL REUS W/TWL LRG LVL3 (GOWN DISPOSABLE) ×6
LIQUID BAND (GAUZE/BANDAGES/DRESSINGS) ×3 IMPLANT
LOCATOR NERVE 3 VOLT (DISPOSABLE) IMPLANT
NEEDLE HYPO 25X1 1.5 SAFETY (NEEDLE) ×3 IMPLANT
NS IRRIG 1000ML POUR BTL (IV SOLUTION) ×3 IMPLANT
PACK BASIN DAY SURGERY FS (CUSTOM PROCEDURE TRAY) ×3 IMPLANT
PAD ALCOHOL SWAB (MISCELLANEOUS) ×6 IMPLANT
PENCIL BUTTON HOLSTER BLD 10FT (ELECTRODE) ×3 IMPLANT
PIN SAFETY STERILE (MISCELLANEOUS) IMPLANT
PROBE NERVBE PRASS .33 (MISCELLANEOUS) ×3 IMPLANT
SHEARS HARMONIC 9CM CVD (BLADE) ×3 IMPLANT
SLEEVE SCD COMPRESS KNEE MED (MISCELLANEOUS) ×3 IMPLANT
SPONGE INTESTINAL PEANUT (DISPOSABLE) ×6 IMPLANT
STAPLER VISISTAT 35W (STAPLE) ×3 IMPLANT
SUT ETHILON 3 0 PS 1 (SUTURE) ×3 IMPLANT
SUT PROLENE 5 0 P 3 (SUTURE) ×3 IMPLANT
SUT SILK 2 0 FS (SUTURE) ×6 IMPLANT
SUT SILK 2 0 TIES 17X18 (SUTURE)
SUT SILK 2-0 18XBRD TIE BLK (SUTURE) IMPLANT
SUT SILK 3 0 TIES 17X18 (SUTURE) ×2
SUT SILK 3-0 18XBRD TIE BLK (SUTURE) ×1 IMPLANT
SUT SILK 4 0 TIES 17X18 (SUTURE) IMPLANT
SUT VIC AB 3-0 FS2 27 (SUTURE) IMPLANT
SUT VICRYL 4-0 PS2 18IN ABS (SUTURE) ×9 IMPLANT
SYR BULB 3OZ (MISCELLANEOUS) ×3 IMPLANT
SYR CONTROL 10ML LL (SYRINGE) ×3 IMPLANT
TOWEL OR 17X24 6PK STRL BLUE (TOWEL DISPOSABLE) ×3 IMPLANT
TRAY DSU PREP LF (CUSTOM PROCEDURE TRAY) ×3 IMPLANT
TUBE CONNECTING 20'X1/4 (TUBING) ×1
TUBE CONNECTING 20X1/4 (TUBING) ×2 IMPLANT

## 2015-05-17 NOTE — Transfer of Care (Signed)
Immediate Anesthesia Transfer of Care Note  Patient: [name] Walker  Procedure(s) Performed: Procedure(s): PAROTIDECTOMY- LEFT TOTAL (Left)  Patient Location: PACU  Anesthesia Type:General  Level of Consciousness: awake and sedated  Airway & Oxygen Therapy: Patient Spontanous Breathing and Patient connected to face mask oxygen  Post-op Assessment: Report given to RN and Post -op Vital signs reviewed and stable  Post vital signs: Reviewed and stable  Last Vitals:  Filed Vitals:   05/17/15 0858  BP: 141/76  Pulse: 74  Temp: 36.8 C  Resp: 20    Complications: No apparent anesthesia complications

## 2015-05-17 NOTE — Discharge Instructions (Signed)
Parotidectomy Care After Refer to this sheet in the next few weeks. These instructions provide you with information on caring for yourself after your procedure. Your caregiver may also give you more specific instructions. Your treatment has been planned according to current medical practices, but problems sometimes occur. Call your caregiver if you have any problems or questions after your procedure. HOME CARE INSTRUCTIONS Wound care  Check your incision every day to make sure that it is not red.  Do not take a shower or bath until your surgeon says it is okay. At first, take only sponge baths. You can probably shower about 1 day after the drain is out. You must keep the incision area dry.  Your stitches will be taken out after about 7 days. Pain  Some pain is normal after a parotidectomy. Take whatever pain medicine your surgeon prescribes. Follow the directions carefully.  Do not take over-the-counter pain medicine unless approved by your caregiver.Some painkillers, like aspirin, can cause bleeding. Diet  You can eat like you normally do once you are home. However, it might hurt to chew for a while. Stay away from foods that are hard to chew.  It may help to take your pain medicine about 30 minutes before you eat. Other precautions  Keep your head propped up when you lie down. Try using 2 pillows to do this. Do it for about 2 weeks after your surgery.  You can probably go back to your normal routine after a few days.However, do not do anything that requires great effort until your surgeon says it is okay. SEEK MEDICAL CARE IF:  You have any questions about your medicines.  Pain does not go away, even after taking pain medicine.  You vomit or feel nauseous. SEEK IMMEDIATE MEDICAL CARE IF:   You are taking pain medicine but your pain gets much worse.  Yourincision looks red and swollen or blood or fluid leaks from the wound.  Skin on your ear or face gets red and  swollen.  Your face is numb or feels weak.  You have a fever. MAKE SURE YOU:  Understand these instructions.  Will watch your condition.  Will get help right away if you are not doing well or get worse. Document Released: 10/13/2010 Document Revised: 12/03/2011 Document Reviewed: 10/13/2010 [facility] Patient Information 2015 [place], [place]. This information is not intended to replace advice given to you by your health care provider. Make sure you discuss any questions you have with your health care provider.

## 2015-05-17 NOTE — Anesthesia Postprocedure Evaluation (Signed)
  Anesthesia Post-op Note  Patient: [name] Walker  Procedure(s) Performed: Procedure(s): PAROTIDECTOMY- LEFT TOTAL (Left)  Patient Location: PACU  Anesthesia Type:General  Level of Consciousness: awake, alert  and oriented  Airway and Oxygen Therapy: Patient Spontanous Breathing  Post-op Pain: mild  Post-op Assessment: Post-op Vital signs reviewed and Patient's Cardiovascular Status Stable              Post-op Vital Signs: Reviewed and stable  Last Vitals:  Filed Vitals:   05/17/15 1430  BP: 136/56  Pulse: 79  Temp:   Resp: 12    Complications: No apparent anesthesia complications

## 2015-05-17 NOTE — Anesthesia Procedure Notes (Signed)
Procedure Name: Intubation Performed by: Terrance Mass Pre-anesthesia Checklist: Patient identified, Timeout performed, Emergency Drugs available, Suction available and Patient being monitored Patient Re-evaluated:Patient Re-evaluated prior to inductionOxygen Delivery Method: Circle system utilized Preoxygenation: Pre-oxygenation with 100% oxygen Intubation Type: IV induction Ventilation: Mask ventilation without difficulty Laryngoscope Size: Miller and 2 Tube type: Oral Tube size: 7.0 mm Number of attempts: 1 Airway Equipment and Method: Stylet Placement Confirmation: ETT inserted through vocal cords under direct vision,  positive ETCO2 and breath sounds checked- equal and bilateral Secured at: 22 cm Tube secured with: Tape Dental Injury: Teeth and Oropharynx as per pre-operative assessment

## 2015-05-17 NOTE — H&P (Signed)
Cc: Left parotid mass  HPI: The patient is an 81 year old female who returns today with her daughter.  The patient was last seen 2 weeks ago for her large left neck mass.  She subsequently underwent a neck CT scan.  The CT showed a 5 cm left parotid mass.  The mass is largely well circumscribed.  However, the inner margin is inseparable from the sternocleidomastoid muscle, and muscular invasion could not be excluded.  The patient returns complaining of further enlargement of the left parotid mass. The patient has noted occasional bleeding through the skin.  She would like to have the mass removed. No other ENT, GI, or respiratory issue noted since the last visit.   Exam: General: Communicates without difficulty, well nourished, no acute distress. Head: Normocephalic, no evidence injury, no tenderness, facial buttresses intact without stepoff. Eyes: PERRL, EOMI. No scleral icterus, conjunctivae clear. Neuro: CN II exam reveals vision grossly intact.  No nystagmus at any point of gaze. Ears: Auricles well formed without lesions.  Ear canals are intact without mass or lesion.  No erythema or edema is appreciated.  The TMs are intact without fluid. Nose: External evaluation reveals normal support and skin without lesions.  Dorsum is intact.  Anterior rhinoscopy reveals healthy pink mucosa over anterior aspect of inferior turbinates and intact septum.  No purulence noted. Oral:  Oral cavity and oropharynx are intact, symmetric, without erythema or edema.  Mucosa is moist without lesions. Neck: Full range of motion without pain.  A large 5 cm soft tissue mass at the left upper neck. Neuro:  CN 2-12 grossly intact. Gait normal.   Assessment A large 5 cm left parotid mass.  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  However, malignancy could not be ruled out.    Plan  1.  The physical exam findings and the CT images are reviewed with the  patient.  2.  The patient would like to have the tumor removed. The risks, benefits, alternatives and details of the parotidectomy surgery are extensively discussed.  We will schedule the procedure in accordance with the family's schedule.

## 2015-05-17 NOTE — Anesthesia Preprocedure Evaluation (Signed)
Anesthesia Evaluation  Patient identified by MRN, date of birth, ID band Patient awake    Airway Mallampati: I  TM Distance: >3 FB Neck ROM: Full    Dental  (+) Teeth Intact, Dental Advisory Given   Pulmonary COPD COPD inhaler,  breath sounds clear to auscultation        Cardiovascular hypertension, Pt. on medications Rhythm:Regular Rate:Normal     Neuro/Psych    GI/Hepatic GERD-  Medicated and Controlled,  Endo/Other    Renal/GU      Musculoskeletal   Abdominal   Peds  Hematology   Anesthesia Other Findings   Reproductive/Obstetrics                             Anesthesia Physical Anesthesia Plan  ASA: III  Anesthesia Plan: General   Post-op Pain Management:    Induction: Intravenous  Airway Management Planned: Oral ETT  Additional Equipment:   Intra-op Plan:   Post-operative Plan: Extubation in OR  Informed Consent: I have reviewed the patients History and Physical, chart, labs and discussed the procedure including the risks, benefits and alternatives for the proposed anesthesia with the patient or authorized representative who has indicated his/her understanding and acceptance.   Dental advisory given  Plan Discussed with: CRNA, Anesthesiologist and Surgeon  Anesthesia Plan Comments:         Anesthesia Quick Evaluation

## 2015-05-17 NOTE — Op Note (Signed)
DATE OF PROCEDURE:  05/17/2015                              OPERATIVE REPORT  SURGEON:  Leta Baptist, MD  ASSISTANT: Rometta Emery, PA-C  PREOPERATIVE DIAGNOSES: 1. Left parotid mass  POSTOPERATIVE DIAGNOSES: 1. Left parotid mass  PROCEDURE PERFORMED:  Left total parotidectomy  ANESTHESIA:  General endotracheal tube anesthesia.  COMPLICATIONS:  None.  ESTIMATED BLOOD LOSS:  50 ml  INDICATION FOR PROCEDURE:  [name] Walker is a 81 y.o. female with a history of a rapidly enlarging left parotid mass.  On her CT scan, she was noted to have a 5 cm soft tissue mass within the tail of the left parotid. The mass appears to have invaded into the sternocleidomastoid muscle. On examination, the mass has also invaded through the superficial skin. The findings were concerning for malignancy. Based on the above findings, the decision was made for the patient to undergo the parotidectomy procedure. The risks, benefits, alternatives, and details of the procedure were discussed with the mother.  Questions were invited and answered.  Informed consent was obtained.  DESCRIPTION:  The patient was taken to the operating room and placed supine on the operating table.  General endotracheal tube anesthesia was administered by the anesthesiologist.  The patient was positioned and prepped and draped in a standard fashion for left parotidectomy.  Preop antibiotics was given.  Facial nerve monitoring electrodes were placed. The facial nerve monitoring system was functional throughout the case. 1% lidocaine with 1-100,000 epinephrine was infiltrated at the planned site of incision. A standard lazy S left parotidectomy incision was made. The incision was modified to include the portion of the skin that was invaded by the tumor. The skin immediately overlying the tumor was excised together with the tumor. A superficial parotid skin flap was elevated in the standard fashion. Dissection was carried out anterior to the  left auricular cartilage. The main trunk of the facial nerve was identified and preserved. Careful dissection was then carried out to free all branches of the facial nerve. The tumor was noted to be inferior and deep to the facial nerve. As a result, the deep lobe of the parotid gland was also excised together with the large mass. The mass was noted to have invaded into the sternocleidomastoid muscle. The tumor was separated from the inferior branches of the facial nerve. The entire mass was removed. It was sent to the pathology department for permanent histologic identification.  The care of the patient was turned over to the anesthesiologist.  The patient was awakened from anesthesia without difficulty.  She was extubated and transferred to the recovery room in good condition.  OPERATIVE FINDINGS:  A large left parotid mass. The mass has invaded into the sternocleidomastoid muscle and the overlying skin. The findings were concerning for malignancy.  SPECIMEN:  Left parotid mass.  FOLLOWUP CARE:  The patient will be observed overnight at the hospital. She will most likely be discharged home on postop day #1.    Ascencion Dike 05/17/2015 12:24 PM

## 2015-05-18 ENCOUNTER — Encounter (HOSPITAL_BASED_OUTPATIENT_CLINIC_OR_DEPARTMENT_OTHER): Payer: Self-pay | Admitting: Otolaryngology

## 2015-05-18 NOTE — Discharge Summary (Signed)
Physician Discharge Summary  Patient ID: [name] Walker MRN: [id number] DOB/AGE: 1925/08/07 80 y.o.  Admit date: 05/17/2015 Discharge date: 05/18/2015  Admission Diagnoses: Left parotid mass  Discharge Diagnoses: Left parotid mass Active Problems:   H/O parotidectomy   Discharged Condition: good  Hospital Course: Pt had an uneventful overnight stay. Pt tolerated po well. No bleeding. No stridor. CN 7 intact bilaterally.  Consults: None  Significant Diagnostic Studies: none  Treatments: surgery: Left total parotidectomy  Discharge Exam: Blood pressure 104/55, pulse 68, temperature 97.5 F (36.4 C), temperature source Oral, resp. rate 16, height 4\' 10"  (1.473 m), weight 82.555 kg (182 lb), SpO2 98 %. Incision/Wound:c/d/i  Disposition: 01-Home or Self Care  Discharge Instructions    Activity as tolerated - No restrictions    Complete by:  As directed      Diet general    Complete by:  As directed             Medication List    STOP taking these medications        apixaban 2.5 MG Tabs tablet  Commonly known as:  ELIQUIS      TAKE these medications        amitriptyline 75 MG tablet  Commonly known as:  ELAVIL  TAKE 1 TABLETS AT BEDTIME     amLODipine 2.5 MG tablet  Commonly known as:  NORVASC  TAKE 1 TABLET IN MORNING     calcium-vitamin D 250-100 MG-UNIT per tablet  Take 2 tablets by mouth daily.     citalopram 20 MG tablet  Commonly known as:  CELEXA  Take 1 tablet (20 mg total) by mouth daily.     clindamycin 300 MG capsule  Commonly known as:  CLEOCIN  Take 1 capsule (300 mg total) by mouth 3 (three) times daily.     donepezil 10 MG tablet  Commonly known as:  ARICEPT  Take 1 tablet (10 mg total) by mouth at bedtime.     furosemide 40 MG tablet  Commonly known as:  LASIX  TAKE  (1)  TABLET TWICE A DAY.     gabapentin 300 MG capsule  Commonly known as:  NEURONTIN  TAKE (1) CAPSULE TWICE DAILY.     levocetirizine 5 MG tablet  Commonly  known as:  XYZAL  Take 1 tablet (5 mg total) by mouth at bedtime.     omeprazole 20 MG capsule  Commonly known as:  PRILOSEC  TAKE 2 CAPSULES DAILY AS NEEDED FOR REFLUX     oxyCODONE-acetaminophen 5-325 MG per tablet  Commonly known as:  ROXICET  Take 1 tablet by mouth every 4 (four) hours as needed for severe pain.     potassium chloride 10 MEQ tablet  Commonly known as:  K-DUR,KLOR-CON  TAKE (2) TABLETS DAILY     rOPINIRole 1 MG tablet  Commonly known as:  REQUIP  TAKE 1 OR 2 TABLETS AT BEDTIME     traMADol 50 MG tablet  Commonly known as:  ULTRAM  TAKE (1) TABLET DAILY AS NEEDED.     triamcinolone cream 0.1 %  Commonly known as:  KENALOG  Apply 1 application topically daily.     Vitamin D3 2000 UNITS Chew  Chew 1 tablet by mouth daily.           Follow-up Information    Follow up with Ascencion Dike, MD In 1 week.   Specialty:  Otolaryngology   Why:  As scheduled   Contact information:   [address] [place] [address] [phone]       Signed: Ascencion Dike 05/18/2015, 7:33 AM

## 2015-05-20 NOTE — Addendum Note (Signed)
Addendum  created 05/20/15 0717 by Lorrene Reid, MD   Modules edited: Anesthesia Responsible Staff

## 2015-05-24 DIAGNOSIS — C800 Disseminated malignant neoplasm, unspecified: Secondary | ICD-10-CM | POA: Diagnosis not present

## 2015-05-25 ENCOUNTER — Other Ambulatory Visit (INDEPENDENT_AMBULATORY_CARE_PROVIDER_SITE_OTHER): Payer: Self-pay | Admitting: Otolaryngology

## 2015-05-25 DIAGNOSIS — IMO0002 Reserved for concepts with insufficient information to code with codable children: Secondary | ICD-10-CM

## 2015-05-29 ENCOUNTER — Other Ambulatory Visit: Payer: Self-pay | Admitting: Family Medicine

## 2015-06-01 ENCOUNTER — Encounter (HOSPITAL_COMMUNITY): Payer: Medicare Other | Attending: Hematology & Oncology | Admitting: Hematology & Oncology

## 2015-06-01 ENCOUNTER — Encounter (HOSPITAL_COMMUNITY): Payer: Self-pay | Admitting: Hematology & Oncology

## 2015-06-01 VITALS — BP 127/59 | HR 78 | Temp 98.5°F | Resp 16 | Ht 58.25 in | Wt 178.8 lb

## 2015-06-01 DIAGNOSIS — C07 Malignant neoplasm of parotid gland: Secondary | ICD-10-CM | POA: Diagnosis not present

## 2015-06-01 DIAGNOSIS — Z9049 Acquired absence of other specified parts of digestive tract: Secondary | ICD-10-CM

## 2015-06-01 DIAGNOSIS — Z9889 Other specified postprocedural states: Secondary | ICD-10-CM

## 2015-06-01 LAB — COMPREHENSIVE METABOLIC PANEL
ALBUMIN: 3.9 g/dL (ref 3.5–5.0)
ALT: 11 U/L — AB (ref 14–54)
AST: 20 U/L (ref 15–41)
Alkaline Phosphatase: 104 U/L (ref 38–126)
Anion gap: 8 (ref 5–15)
BILIRUBIN TOTAL: 0.6 mg/dL (ref 0.3–1.2)
BUN: 12 mg/dL (ref 6–20)
CHLORIDE: 102 mmol/L (ref 101–111)
CO2: 30 mmol/L (ref 22–32)
CREATININE: 1 mg/dL (ref 0.44–1.00)
Calcium: 8.9 mg/dL (ref 8.9–10.3)
GFR calc Af Amer: 57 mL/min — ABNORMAL LOW (ref >60)
GFR, EST NON AFRICAN AMERICAN: 49 mL/min — AB (ref >60)
GLUCOSE: 96 mg/dL (ref 65–99)
Potassium: 3.4 mmol/L — ABNORMAL LOW (ref 3.5–5.1)
Sodium: 140 mmol/L (ref 135–145)
Total Protein: 7.6 g/dL (ref 6.5–8.1)

## 2015-06-01 LAB — CBC WITH DIFFERENTIAL/PLATELET
BASOS ABS: 0 10*3/uL (ref 0.0–0.1)
Basophils Relative: 0 % (ref 0–1)
Eosinophils Absolute: 0.3 10*3/uL (ref 0.0–0.7)
Eosinophils Relative: 4 % (ref 0–5)
HEMATOCRIT: 36.2 % (ref 36.0–46.0)
Hemoglobin: 12.1 g/dL (ref 12.0–15.0)
LYMPHS PCT: 26 % (ref 12–46)
Lymphs Abs: 1.7 10*3/uL (ref 0.7–4.0)
MCH: 32 pg (ref 26.0–34.0)
MCHC: 33.4 g/dL (ref 30.0–36.0)
MCV: 95.8 fL (ref 78.0–100.0)
Monocytes Absolute: 0.9 10*3/uL (ref 0.1–1.0)
Monocytes Relative: 13 % — ABNORMAL HIGH (ref 3–12)
NEUTROS ABS: 3.8 10*3/uL (ref 1.7–7.7)
Neutrophils Relative %: 57 % (ref 43–77)
PLATELETS: 325 10*3/uL (ref 150–400)
RBC: 3.78 MIL/uL — AB (ref 3.87–5.11)
RDW: 13.3 % (ref 11.5–15.5)
WBC: 6.7 10*3/uL (ref 4.0–10.5)

## 2015-06-01 NOTE — Patient Instructions (Signed)
[place] at [facility] Discharge Instructions  RECOMMENDATIONS MADE BY THE CONSULTANT AND ANY TEST RESULTS WILL BE SENT TO YOUR REFERRING PHYSICIAN.  Blood work today  PET scan on 06/09/15. Don't eat or drink anything 6 hours prior to PET scan. No gum, hard candy, no sugarfree candy/gum the morning prior to PET scan.  Return to see Dr. Whitney Muse after PET scan  Dr. Whitney Muse to contact Dr. Isidore Moos for appointment in [place] @ [facility].  Thank you for choosing [place] at [facility] to provide your oncology and hematology care.  To afford each patient quality time with our provider, please arrive at least 15 minutes before your scheduled appointment time.    You need to re-schedule your appointment should you arrive 10 or more minutes late.  We strive to give you quality time with our providers, and arriving late affects you and other patients whose appointments are after yours.  Also, if you no show three or more times for appointments you may be dismissed from the clinic at the providers discretion.     Again, thank you for choosing [facility].  Our hope is that these requests will decrease the amount of time that you wait before being seen by our physicians.       _____________________________________________________________  Should you have questions after your visit to [facility], please contact our office at [phone] between the hours of 8:30 a.m. and 4:30 p.m.  Voicemails left after 4:30 p.m. will not be returned until the following business day.  For prescription refill requests, have your pharmacy contact our office.   Positron Emission Tomography (PET Scan) PET stands for positron emission tomography. This is a test similar to an X-ray. Pictures can be taken of a body part after injection of a very small dose of a chemical called a radionuclide. This is combined with sugar, water, or  ammonia to give off tiny particles called positrons. The positrons emitted are like small bursts of energy that can be detected by a scanner. They are processed by a computer to create images. These images can be used to study different diseases. They are often used to study cancer and cancer therapy. A scan of the entire body can be done and used to study all its parts. Because this test is tagged to a sugar used by cells, the bursts of energy show up differently in cells that use sugar faster. The computer is able to produce a color-coded picture based on this. The colors and amount of brightness on a PET image show different levels of tissue or organ function. For example, a cancer grows faster than healthy tissue and uses more sugar than normal tissue. It will absorb more of the substance injected. This causes it to appear brighter than normal tissue on the PET image. A specialist will read and explain the images. Other examinations, such as recent CT (or CAT) scans or MRI scans may help with interpretation and should be brought along. There are usually no restrictions after the test. You should drink plenty of fluids to flush the radioactive substance from your body. BEFORE THE PROCEDURE   PET is usually an outpatient procedure. Wear comfortable, loose-fitting clothes.  Do not eat for four hours before the scan. You will be encouraged to drink water.  Your caregiver will instruct you regarding the use of medications before the test.  Note: Diabetic patients should ask for  any specific diet guidelines to control glucose (sugar) levels during the day of the test. There are limitations with the test if your blood sugar is not controlled during or before the test.  Be on time because of the rapid decay of the radioactive material that must be injected. PROCEDURE  Before the procedure begins a small amount of harmless radioactive material will be injected into a vein. This means you will have a needle  stick. It will take from 30 minutes to one hour for the material to travel around your body in preparation for the scan. You will lie on a cushioned table and be moved through the center of a machine that looks like a large doughnut. This is the machine that detects the positrons. It is connected to a computer that produces images that can be viewed on a monitor. This will take about 30 minutes to an hour, during which you must remain still. Let your caregiver know if this will be difficult for you. Also, let your caregiver know if you need a sedative or help dealing with claustrophobia (feeling uncomfortable in enclosed spaces). HOME CARE INSTRUCTIONS   For the protection of your privacy, test results can not be given over the phone. Make sure you receive the results of your test. Ask as to how these results are to be obtained if you have not been informed. It is your responsibility to obtain your test results.  Drink several 8-once glasses of water following the test to flush the small amount of radioactive material out of your body.  Keep your follow-up appointments. Document Released: 03/17/2003 Document Revised: 12/03/2011 Document Reviewed: 12/23/2013 [facility] Patient Information 2015 [place], [place]. This information is not intended to replace advice given to you by your health care provider. Make sure you discuss any questions you have with your health care provider.

## 2015-06-01 NOTE — Progress Notes (Signed)
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 female is here for additional consultation regarding an invasive squamous cell carcinoma of the parotid. She has undergone surgical resection with Dr. Benjamine Mola on 05/17/2015.   She is here today with her daughter and her grand-niece, to whom she refers as her granddaughter. Ms. [name] Walker is very sociable and has quite an attitude.  Her daughter reports that Ms. [name] Walker has done very well with her surgery, even though Dr. Benjamine Mola was not able to remove the entire mass. She also did well with her "throat scope." When asked about the history of her mass, Ms. [name] Walker states that she never really noticed or cared about it until recently. A past doctor asked to "poke it with a needle" (biopsy?) at one point, and she refused. She states that she's been hoarse for several years. Her grand niece states that Ms. [name] Walker has also had trouble swallowing for years.  She states that she hasn't had a mammogram in a long time, and doesn't see her doctor "unless something goes wrong." She does report having benign cysts in her breasts when she was younger.  She states that she's always been a pretty healthy person. She gets up and gets dressed every day, unless she doesn't feel like it. She still drives and even went to the [employer] by herself the other day. Before her husband died, she says she was the one doing all the housework, yardwork, and everything else. To this day, she is still active, cleans her  house, and even paints her house at times. She tells a story of painting her kitchen floor red after her rehab, because her daughter had painted it black against her will.  She has a history of blood clots in her legs. She remarks her last blood clot could have been as much as two years ago. She is currently still on blood thinners. She went off of them for her surgery, and then went back on them. Dr. Nils Pyle in [place] has treated her blood clots.  She prefers being independent and dislikes anything that threatens her independence. When asked if anyone is gonna tell her what to do, she says "not hardly."  Ms. [name] Walker has no teeth, and no dentures that she personally prefers to wear.  She states that she wants to know whatever her current prognosis is, good or bad.   MEDICAL HISTORY:  Past Medical History  Diagnosis Date  . DVT (deep venous thrombosis)   . Hypertension   . COPD (chronic obstructive pulmonary disease)   . GERD (gastroesophageal reflux disease)   . Osteoarthritis   . Ischemic colitis   . Depression   . Gastric erosion   . Insomnia   . Cataract   . Parotid mass 2016    SURGICAL HISTORY: Past Surgical History  Procedure Laterality Date  . Total hip arthroplasty      bilateral  . Knee arthroplasty      bilateral  . Esophagogastroduodenoscopy  10/17/2007    erosion   . Colonoscopy    . Back  surgery    . Abdominal hysterectomy    . Cholecystectomy    . Cataracts    . Parotidectomy Left 05/17/2015    Procedure: PAROTIDECTOMY- LEFT TOTAL;  Surgeon: Leta Baptist, MD;  Location: [facility];  Service: ENT;  Laterality: Left;    SOCIAL HISTORY: Social History   Social History  . Marital Status: Widowed    Spouse Name: N/A  . Number of Children: N/A  . Years of Education: N/A   Occupational History  . Not on file.   Social History Main Topics  . Smoking status: Never Smoker   . Smokeless tobacco: Never Used  . Alcohol Use: No  . Drug Use: No  .  Sexual Activity: No   Other Topics Concern  . Not on file   Social History Narrative   Widowed for 25 years and lives alone.  She likes living alone. 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 Rheum arthritis Mother   . Ulcers Mother     on foot  . Ulcers Father   . Cancer Sister     uterine   indicated that her mother is deceased. She indicated that her father is deceased. She indicated that her sister is deceased.    Her father died of throat cancer at 47 Mother died at 22 of old age; she had rheumatoid arthritis She had one sister, no brothers; sister died from uterine cancer at 78  ALLERGIES:  is allergic to iohexol; latex; penicillins; and shellfish allergy.  MEDICATIONS:  Current Outpatient Prescriptions  Medication Sig Dispense Refill  . amitriptyline (ELAVIL) 75 MG tablet TAKE 1 TABLETS AT BEDTIME 30 tablet 2  . amLODipine (NORVASC) 2.5 MG tablet TAKE 1 TABLET IN MORNING 90 tablet 1  . Cholecalciferol (VITAMIN D3) 2000 UNITS CHEW Chew 1 tablet by mouth daily.    . citalopram (CELEXA) 20 MG tablet Take 1 tablet by mouth  daily 90 tablet 0  . ELIQUIS 2.5 MG TABS tablet Take 2.5 mg by mouth 2 (two) times daily.    . furosemide (LASIX) 40 MG tablet TAKE  (1)  TABLET TWICE A DAY. 180 tablet 1  . gabapentin (NEURONTIN) 300 MG capsule TAKE 1 CAPSULE TWICE DAILY. 180 capsule 0  . levocetirizine (XYZAL) 5 MG tablet Take 1 tablet (5 mg total) by mouth at bedtime. 90 tablet 1  . omeprazole (PRILOSEC) 20 MG capsule TAKE 2 CAPSULES DAILY AS NEEDED FOR REFLUX 180 capsule 1  . oxyCODONE-acetaminophen (ROXICET) 5-325 MG per tablet Take 1  tablet by mouth every 4 (four) hours as needed for severe pain. 30 tablet 0  . potassium chloride (K-DUR,KLOR-CON) 10 MEQ tablet TAKE (2) TABLETS DAILY 180 tablet 1  . rOPINIRole (REQUIP) 1 MG tablet TAKE 1 OR 2 TABLETS AT  BEDTIME 180 tablet 0  . triamcinolone cream (KENALOG) 0.1 % Apply 1 application topically daily. 30 g 1  . calcium-vitamin D 250-100 MG-UNIT per tablet Take 2 tablets by mouth daily.    . clindamycin (CLEOCIN) 300 MG capsule Take 1 capsule (300 mg total) by mouth 3 (three) times daily. (Patient not taking: Reported on 06/01/2015)  15 capsule 0  . donepezil (ARICEPT) 10 MG tablet Take 1 tablet (10 mg total) by mouth at bedtime. 90 tablet 0  . traMADol (ULTRAM) 50 MG tablet TAKE (1) TABLET DAILY AS NEEDED. (Patient not taking: Reported on 06/01/2015) 30 tablet 0   No current facility-administered medications for this visit.    Review of Systems  Constitutional: Negative.        Uses a walker.  HENT: Positive for sore throat.        Trouble swallowing. Hoarseness. Symptoms related to the mass in her throat.  Eyes: Negative.   Respiratory: Negative.   Cardiovascular: Negative.        History of blood clots in her legs.  Gastrointestinal: Negative.   Genitourinary: Positive for urgency and frequency.  Musculoskeletal: Positive for back pain and joint pain.       Uses walker or cane  Skin: Negative.   Neurological: Negative.   Endo/Heme/Allergies: Negative.   Psychiatric/Behavioral: Negative.   All other systems reviewed and are negative.  14 point ROS was done and is otherwise as detailed above or in HPI    PHYSICAL EXAMINATION: ECOG PERFORMANCE STATUS: 1 - Symptomatic but completely ambulatory  Filed Vitals:   06/01/15 1500  BP: 127/59  Pulse: 78  Temp: 98.5 F (36.9 C)  Resp: 16   Filed Weights   06/01/15 1500  Weight: 178 lb 12.8 oz (81.103 kg)    Patient is able to ambulate to the table without her walker, and with some assistance. She is also able to  climb onto the table with some assistance.  Physical Exam  Constitutional: She is oriented to person, place, and time and well-developed, well-nourished, and in no distress. No distress.  HENT:  Head: Normocephalic and atraumatic.  Mouth/Throat: Oropharynx is clear and moist.  No tonsils. No teeth, and no dentures.  Eyes: Conjunctivae and EOM are normal. Pupils are equal, round, and reactive to light. Right eye exhibits no discharge. Left eye exhibits no discharge. No scleral icterus.  Neck: Normal range of motion. Neck supple. No tracheal deviation present. No thyromegaly present.  Well-healing left neck surgical scar.  Cardiovascular: Normal rate and regular rhythm.   Murmur heard. She has an IVC filter.  Pulmonary/Chest: Effort normal and breath sounds normal. No respiratory distress. She has no wheezes. She has no rales.  Abdominal: Soft. Bowel sounds are normal. She exhibits no distension. There is no tenderness. There is no rebound and no guarding.  Musculoskeletal: Normal range of motion.  Neurological: She is alert and oriented to person, place, and time. Gait normal.  Skin: Skin is warm and dry. She is not diaphoretic.  Psychiatric: Mood and affect normal.  Nursing note and vitals reviewed.   LABORATORY DATA:  I have reviewed the data as listed Lab Results  Component Value Date   WBC 6.7 06/01/2015   HGB 12.1 06/01/2015   HCT 36.2 06/01/2015   MCV 95.8 06/01/2015   PLT 325 06/01/2015    RADIOGRAPHIC STUDIES: I have personally reviewed the radiological images as listed and agreed with the findings in the report.   CLINICAL DATA: Large growth on the left side of the neck, 6 months duration. Allergic to intravenous contrast by history.  EXAM: CT NECK WITHOUT CONTRAST  TECHNIQUE: Multidetector CT imaging of the neck was performed following the standard protocol without intravenous contrast.  COMPARISON: 11/09/2014. 07/22/2013.  FINDINGS: There is a mass  arising in the superficial portion of the left parotid gland inferiorly with  a transverse diameter of 2.9 cm and a cephalocaudal length of 5 cm. The lesion is largely well-circumscribed and there is some calcification along the lateral margin. The inner margin is inseparable from the sternocleidomastoid muscle and muscular invasion is not excluded. There are not any enlarged or low-density nodes on either side of the neck. No second parotid lesion. Submandibular glands are normal. No mucosal or submucosal lesion. Vascular structures are patent. No intracranial abnormality seen. Lung apices show mild scarring but no evidence of mass. The lesion was not present in October of 2014. In retrospect, on a facial CT done for trauma in February of this year, the abnormality could be seen in the corner of the study and measured 17 mm in diameter with a cephalocaudal length of 2.4 cm at that time.  IMPRESSION: Well-circumscribed mass in the inferior aspect of the superficial portion of the left parotid gland with some calcification along the lateral wall. Inner margin cannot be clearly separated from the sternocleidomastoid muscle. Though this may simply be close proximity, muscular invasion is not excluded. No enlarged lymph nodes. 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 Pleomorphic adenoma is possible.   Electronically Signed  By: Nelson Chimes M.D.  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 Unfortunately she had a positive surgical margin and tumor was noted to invade the underlying skeletal muscle. She is 41 but is fairly active for her age. She still lives independently. She is interested in pursuing additional therapy  if needed.  I recommended a PET CT for additional evaluation. Dr. Benjamine Mola has ordered a scan and it will be done on the 15th. We will check baseline labs today. I will see her back after imaging is complete. Pending the results of her PET/CT we will refer her to [facility] for consideration of radiation.  Orders Placed This Encounter  Procedures  . CBC with Differential  . Comprehensive metabolic panel    All questions were answered. The patient knows to call the clinic with any problems, questions or concerns.  This document serves as a record of services personally performed by Ancil Linsey, MD. It was created on her behalf by Toni Amend, a trained medical scribe. The creation of this record is based on the scribe's personal observations and the provider's statements to them. This document has been checked and approved by the attending provider.  I have reviewed the above documentation for accuracy and completeness, and I agree with the above.  This note was electronically signed.   Molli Hazard, MD  06/15/2015 4:46 PM

## 2015-06-01 NOTE — Progress Notes (Signed)
[name] Walker presented for labwork. Labs per MD order drawn via Peripheral Line 25 gauge needle inserted in [place].  Good blood return present. Procedure without incident.  Needle removed intact. Patient tolerated procedure well.

## 2015-06-03 ENCOUNTER — Other Ambulatory Visit: Payer: Self-pay

## 2015-06-03 ENCOUNTER — Other Ambulatory Visit: Payer: Self-pay | Admitting: Family Medicine

## 2015-06-03 MED ORDER — DONEPEZIL HCL 10 MG PO TABS
10.0000 mg | ORAL_TABLET | Freq: Every day | ORAL | Status: DC
Start: 2015-06-03 — End: 2015-08-03

## 2015-06-09 ENCOUNTER — Other Ambulatory Visit (INDEPENDENT_AMBULATORY_CARE_PROVIDER_SITE_OTHER): Payer: Self-pay | Admitting: Otolaryngology

## 2015-06-09 ENCOUNTER — Ambulatory Visit (HOSPITAL_COMMUNITY)
Admission: RE | Admit: 2015-06-09 | Discharge: 2015-06-09 | Disposition: A | Discharge status: Home / Self Care | Payer: Medicare Other | Source: Ambulatory Visit | Attending: Otolaryngology | Admitting: Otolaryngology

## 2015-06-09 DIAGNOSIS — C4492 Squamous cell carcinoma of skin, unspecified: Secondary | ICD-10-CM | POA: Diagnosis not present

## 2015-06-09 DIAGNOSIS — C801 Malignant (primary) neoplasm, unspecified: Secondary | ICD-10-CM | POA: Insufficient documentation

## 2015-06-09 DIAGNOSIS — IMO0002 Reserved for concepts with insufficient information to code with codable children: Secondary | ICD-10-CM

## 2015-06-09 LAB — GLUCOSE, CAPILLARY: GLUCOSE-CAPILLARY: 107 mg/dL — AB (ref 65–99)

## 2015-06-09 MED ORDER — FLUDEOXYGLUCOSE F - 18 (FDG) INJECTION
8.9300 | Freq: Once | INTRAVENOUS | Status: DC | PRN
  Administered 2015-06-09: 8.93 via INTRAVENOUS
  Filled 2015-06-09: qty 8.93

## 2015-06-10 ENCOUNTER — Encounter (HOSPITAL_BASED_OUTPATIENT_CLINIC_OR_DEPARTMENT_OTHER): Payer: Medicare Other | Admitting: Hematology & Oncology

## 2015-06-10 ENCOUNTER — Encounter (HOSPITAL_COMMUNITY): Payer: Self-pay | Admitting: Lab

## 2015-06-10 VITALS — BP 136/59 | HR 78 | Temp 98.1°F | Resp 20 | Wt 180.4 lb

## 2015-06-10 DIAGNOSIS — C07 Malignant neoplasm of parotid gland: Secondary | ICD-10-CM | POA: Diagnosis not present

## 2015-06-10 NOTE — Patient Instructions (Signed)
[place] at [facility] Discharge Instructions  RECOMMENDATIONS MADE BY THE CONSULTANT AND ANY TEST RESULTS WILL BE SENT TO YOUR REFERRING PHYSICIAN.  Referral to Radiation Oncology in [place]. Return to clinic in 1 month for follow up.  Thank you for choosing [place] at [facility] to provide your oncology and hematology care.  To afford each patient quality time with our provider, please arrive at least 15 minutes before your scheduled appointment time.    You need to re-schedule your appointment should you arrive 10 or more minutes late.  We strive to give you quality time with our providers, and arriving late affects you and other patients whose appointments are after yours.  Also, if you no show three or more times for appointments you may be dismissed from the clinic at the providers discretion.     Again, thank you for choosing [facility].  Our hope is that these requests will decrease the amount of time that you wait before being seen by our physicians.       _____________________________________________________________  Should you have questions after your visit to [facility], please contact our office at [phone] between the hours of 8:30 a.m. and 4:30 p.m.  Voicemails left after 4:30 p.m. will not be returned until the following business day.  For prescription refill requests, have your pharmacy contact our office.

## 2015-06-10 NOTE — Progress Notes (Signed)
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 Ongoing anticoagulation  PET/CT on 06/09/2015 with no clear evidence of local residual disease at the left parotid resection site, no hypermetabolic cervical lymph node in the left or right, intense uptake within the skin surface inferior to the right nostril along the right upper lip, no evidence of metastatic disease  HISTORY OF PRESENTING ILLNESS:  [name] Walker 81 y.o. female is here for follow-up of squamous cell carcinoma of the parotid. She has undergone surgical resection with Dr. Benjamine Mola. The tumor however invaded underlying skeletal muscle and was broadly present at the deep surgical margin.  [name] is here with her granddaughter today. She says she feels good.   Though she had a little earache last night, she is feeling better today. She remarks that she's been sleeping and eating well.   MEDICAL HISTORY:  Past Medical History  Diagnosis Date  . DVT (deep venous thrombosis)   . Hypertension   . COPD (chronic obstructive pulmonary disease)   . GERD (gastroesophageal reflux disease)   . Osteoarthritis   . Ischemic colitis   . Depression   . Gastric erosion   . Insomnia   . Cataract   . Parotid mass 2016    SURGICAL HISTORY: Past Surgical History  Procedure Laterality Date  . Total hip arthroplasty      bilateral  . Knee arthroplasty      bilateral  . Esophagogastroduodenoscopy  10/17/2007    erosion   . Colonoscopy    . Back surgery    . Abdominal hysterectomy    . Cholecystectomy    . Cataracts    .  Parotidectomy Left 05/17/2015    Procedure: PAROTIDECTOMY- LEFT TOTAL;  Surgeon: Leta Baptist, MD;  Location: [facility];  Service: ENT;  Laterality: Left;    SOCIAL HISTORY: Social History   Social History  . Marital Status: Widowed    Spouse Name: N/A  . Number of Children: N/A  . Years of Education: N/A   Occupational History  . Not on file.   Social History Main Topics  . Smoking status: Never Smoker   . Smokeless tobacco: Never Used  . Alcohol Use: No  . Drug Use: No  . Sexual Activity: No   Other Topics Concern  . Not on file   Social History Narrative   Widowed for 25 years and lives alone.  She likes living alone. 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 Rheum arthritis Mother   . Ulcers Mother     on foot  . Ulcers Father   . Cancer Sister     uterine   indicated that her mother is deceased. She indicated that her father is deceased. She indicated that her sister is deceased.  Her father died of throat cancer at 3 Mother died at 62 of old age; she had rheumatoid arthritis She had one sister, no brothers; sister died from uterine cancer at 58  ALLERGIES:  is allergic to iohexol; latex; penicillins; and shellfish allergy.  MEDICATIONS:  Current Outpatient Prescriptions  Medication Sig Dispense Refill  . amitriptyline (ELAVIL) 75 MG tablet TAKE 1 TABLETS AT BEDTIME 30 tablet 2  . amLODipine (NORVASC) 2.5 MG tablet TAKE 1 TABLET IN MORNING 90 tablet 1  . calcium-vitamin D 250-100 MG-UNIT per tablet Take 2 tablets by mouth daily.    . Cholecalciferol (VITAMIN D3) 2000 UNITS CHEW Chew 1 tablet by  mouth daily.    . citalopram (CELEXA) 20 MG tablet Take 1 tablet by mouth  daily 90 tablet 0  . donepezil (ARICEPT) 10 MG tablet Take 1 tablet (10 mg total) by mouth at bedtime. 90 tablet 0  . ELIQUIS 2.5 MG TABS tablet Take 2.5 mg by mouth 2 (two) times daily.    . furosemide (LASIX) 40 MG tablet TAKE  (1)  TABLET TWICE A DAY. 180 tablet 1  . gabapentin (NEURONTIN) 300 MG capsule TAKE 1 CAPSULE TWICE DAILY. 180 capsule 0  . levocetirizine (XYZAL) 5 MG tablet Take 1 tablet (5 mg total) by mouth at bedtime. 90 tablet 1  . omeprazole (PRILOSEC) 20 MG capsule TAKE 2 CAPSULES DAILY AS NEEDED FOR REFLUX 180 capsule 1  . oxyCODONE-acetaminophen (ROXICET) 5-325 MG per tablet Take 1 tablet by mouth every 4 (four) hours as needed for severe pain. 30 tablet 0  . potassium chloride (K-DUR,KLOR-CON) 10 MEQ tablet TAKE (2) TABLETS DAILY 180 tablet 1  . rOPINIRole (REQUIP) 1 MG tablet TAKE 1 OR 2 TABLETS AT  BEDTIME 180 tablet 0  . triamcinolone cream (KENALOG) 0.1 % Apply 1 application topically daily. 30 g 1  . clindamycin (CLEOCIN) 300 MG capsule Take 1 capsule (300 mg total) by mouth 3 (three) times daily. (Patient not taking: Reported on 06/01/2015) 15 capsule 0  . traMADol (ULTRAM) 50 MG tablet TAKE (1) TABLET DAILY AS NEEDED. (Patient not taking: Reported on 06/01/2015) 30 tablet 0   No current facility-administered medications for this visit.   Facility-Administered Medications Ordered in Other Visits  Medication Dose Route Frequency Provider Last Rate Last Dose  . fludeoxyglucose F - 18 (FDG) injection 8.93 milli Curie  8.93 milli Curie Intravenous Once PRN Medication Radiologist, MD   8.93 milli Curie at 06/09/15 1025    Review of Systems  Constitutional:       Uses a walker.  Respiratory: Negative.   Cardiovascular:       History of blood clots in her legs.  All other systems reviewed and are negative.  14 point ROS was done and is otherwise as detailed above or in HPI   PHYSICAL  EXAMINATION: ECOG PERFORMANCE STATUS: 1 - Symptomatic but completely ambulatory  Filed Vitals:   06/10/15 0839  BP: 136/59  Pulse: 78  Temp: 98.1 F (36.7 C)  Resp: 20   Filed Weights   06/10/15 0839  Weight: 180 lb 6.4 oz (81.829 kg)    Patient is able to ambulate to the table without her walker, and with some assistance. She is also able to climb onto the table with some assistance.  Physical Exam  Constitutional: She is oriented to person, place, and time and well-developed, well-nourished, and in no distress.  HENT:  Head: Normocephalic and atraumatic.  Nose: Nose normal.  Mouth/Throat: Oropharynx is clear  and moist. No oropharyngeal exudate.  No tonsils. No teeth, and no dentures.  Eyes: Conjunctivae and EOM are normal. Pupils are equal, round, and reactive to light. Right eye exhibits no discharge. Left eye exhibits no discharge. No scleral icterus.  Neck: Normal range of motion. Neck supple. No tracheal deviation present. No thyromegaly present.  Well-healing left neck surgical scar.  Cardiovascular: Normal rate and regular rhythm.  Exam reveals no gallop and no friction rub.   Murmur heard. Pulmonary/Chest: Effort normal and breath sounds normal. She has no wheezes. She has no rales.  Abdominal: Soft. Bowel sounds are normal. She exhibits no distension and no mass. There is no tenderness. There is no rebound and no guarding.  Musculoskeletal: Normal range of motion. She exhibits no edema.  Lymphadenopathy:    She has no cervical adenopathy.  Neurological: She is alert and oriented to person, place, and time. She has normal reflexes. No cranial nerve deficit. Gait normal. Coordination normal.  Skin: Skin is warm and dry. No rash noted.  Psychiatric: Mood, memory, affect and judgment normal.  Nursing note and vitals reviewed.   LABORATORY DATA:  I have reviewed the data as listed Lab Results  Component Value Date   WBC 6.7 06/01/2015   HGB 12.1 06/01/2015   HCT  36.2 06/01/2015   MCV 95.8 06/01/2015   PLT 325 06/01/2015   CMP     Component Value Date/Time   NA 140 06/01/2015 1617   NA 142 05/13/2015 1557   K 3.4* 06/01/2015 1617   CL 102 06/01/2015 1617   CO2 30 06/01/2015 1617   GLUCOSE 96 06/01/2015 1617   GLUCOSE 97 05/13/2015 1557   BUN 12 06/01/2015 1617   BUN 12 05/13/2015 1557   CREATININE 1.00 06/01/2015 1617   CREATININE 0.85 01/28/2013 1303   CALCIUM 8.9 06/01/2015 1617   PROT 7.6 06/01/2015 1617   PROT 6.8 05/13/2015 1557   ALBUMIN 3.9 06/01/2015 1617   AST 20 06/01/2015 1617   ALT 11* 06/01/2015 1617   ALKPHOS 104 06/01/2015 1617   BILITOT 0.6 06/01/2015 1617   BILITOT 0.4 05/13/2015 1557   GFRNONAA 49* 06/01/2015 1617   GFRNONAA 63 01/28/2013 1303   GFRAA 57* 06/01/2015 1617   GFRAA 72 01/28/2013 1303      RADIOGRAPHIC STUDIES: I have personally reviewed the radiological images as listed and agreed with the findings in the report. Nm Pet Image Initial (pi) Skull Base To Thigh  06/09/2015   CLINICAL DATA:  Initial treatment strategy for squamous cell carcinoma. Invasive squamous cell carcinoma with lymphovascular invasion following LEFT parotid lesion resection.  EXAM: NUCLEAR MEDICINE PET SKULL BASE TO THIGH  TECHNIQUE: 8.9 mCi F-18 FDG was injected intravenously. Full-ring PET imaging was performed from the skull base to thigh after the radiotracer. CT data was obtained and used for attenuation correction and anatomic localization.  FASTING BLOOD GLUCOSE:  Value: 107 mg/dl  COMPARISON:  None.  FINDINGS: NECK  Mild metabolic activity at the resection site of the LEFT parotid gland. No hypermetabolic LEFT level 2 lymph nodes. No hypermetabolic cervical lymph nodes on LEFT or RIGHT.  There is diffuse metabolic activity within oral cavity. This felt to be physiologic. Patient is edentulous. There is focal activity lung the skin surface inferior to the nose along the RIGHT upper lip with SUV max equal 15 (Image 17 the fused  data set).  There is symmetric intense metabolic activity associated the vocal cords which suggests patient was speaking during the FDG  uptake period.  CHEST  No hypermetabolic mediastinal or hilar nodes. Rounded prevascular lymph node measures 9 mm on image 60, series 4 without associated metabolic activity.  No suspicious pulmonary nodules on the CT scan.  ABDOMEN/PELVIS  No abnormal hypermetabolic activity within the liver, pancreas, adrenal glands, or spleen. No hypermetabolic lymph nodes in the abdomen or pelvis. IVC filter noted.  SKELETON  No focal hypermetabolic activity to suggest skeletal metastasis.  IMPRESSION: 1. No clear evidence of local residual disease at the LEFT parotid resection site. 2. No hypermetabolic cervical lymph node in the LEFT or RIGHT. 3. Intense uptake within the skin surface inferior to the RIGHT nostril along the RIGHT upper lip. Recommend clinical correlation. 4. Symmetric uptake within the vocal cords likely relates to phonation during FDG equilibration period. 5. No evidence of distant metastatic disease.   Electronically Signed   By: Suzy Bouchard M.D.   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 I recommended referral to Dr. Isidore Moos for additional discussion of the risks and benefits of radiation therapy. The patient would like to go to [place] as I would be easiest for her from a travel perspective. She will follow-up with me in 4 weeks. She is interim problems or concerns she was advised to call.  I reviewed her blood work from her last visit as well.  All questions were answered. The patient knows to call the clinic with any problems, questions or concerns.  This document  serves as a record of services personally performed by Ancil Linsey, MD. It was created on her behalf by Toni Amend, a trained medical scribe. The creation of this record is based on the scribe's personal observations and the provider's statements to them. This document has been checked and approved by the attending provider.  I have reviewed the above documentation for accuracy and completeness, and I agree with the above.  This note was electronically signed.  Molli Hazard, MD 06/10/2015 3:09 PM

## 2015-06-10 NOTE — Progress Notes (Signed)
Referral sent to [facility].  Records faxed on 9/16.  They will contact patient with appt

## 2015-06-14 ENCOUNTER — Other Ambulatory Visit (HOSPITAL_COMMUNITY): Payer: Self-pay | Admitting: Hematology & Oncology

## 2015-06-14 DIAGNOSIS — Z91013 Allergy to seafood: Secondary | ICD-10-CM | POA: Diagnosis not present

## 2015-06-14 DIAGNOSIS — Z8673 Personal history of transient ischemic attack (TIA), and cerebral infarction without residual deficits: Secondary | ICD-10-CM | POA: Diagnosis not present

## 2015-06-14 DIAGNOSIS — Z91040 Latex allergy status: Secondary | ICD-10-CM | POA: Diagnosis not present

## 2015-06-14 DIAGNOSIS — C07 Malignant neoplasm of parotid gland: Secondary | ICD-10-CM | POA: Diagnosis not present

## 2015-06-14 DIAGNOSIS — Z8049 Family history of malignant neoplasm of other genital organs: Secondary | ICD-10-CM | POA: Diagnosis not present

## 2015-06-14 DIAGNOSIS — Z79899 Other long term (current) drug therapy: Secondary | ICD-10-CM | POA: Diagnosis not present

## 2015-06-14 DIAGNOSIS — Z510 Encounter for antineoplastic radiation therapy: Secondary | ICD-10-CM | POA: Diagnosis not present

## 2015-06-14 DIAGNOSIS — Z880 Allergy status to penicillin: Secondary | ICD-10-CM | POA: Diagnosis not present

## 2015-06-14 DIAGNOSIS — Z85828 Personal history of other malignant neoplasm of skin: Secondary | ICD-10-CM | POA: Diagnosis not present

## 2015-06-14 DIAGNOSIS — I10 Essential (primary) hypertension: Secondary | ICD-10-CM | POA: Diagnosis not present

## 2015-06-15 ENCOUNTER — Encounter (HOSPITAL_COMMUNITY): Payer: Self-pay | Admitting: Hematology & Oncology

## 2015-06-16 DIAGNOSIS — Z8673 Personal history of transient ischemic attack (TIA), and cerebral infarction without residual deficits: Secondary | ICD-10-CM | POA: Diagnosis not present

## 2015-06-16 DIAGNOSIS — Z91013 Allergy to seafood: Secondary | ICD-10-CM | POA: Diagnosis not present

## 2015-06-16 DIAGNOSIS — C07 Malignant neoplasm of parotid gland: Secondary | ICD-10-CM | POA: Diagnosis not present

## 2015-06-16 DIAGNOSIS — I10 Essential (primary) hypertension: Secondary | ICD-10-CM | POA: Diagnosis not present

## 2015-06-16 DIAGNOSIS — Z8049 Family history of malignant neoplasm of other genital organs: Secondary | ICD-10-CM | POA: Diagnosis not present

## 2015-06-16 DIAGNOSIS — Z85828 Personal history of other malignant neoplasm of skin: Secondary | ICD-10-CM | POA: Diagnosis not present

## 2015-06-16 DIAGNOSIS — Z510 Encounter for antineoplastic radiation therapy: Secondary | ICD-10-CM | POA: Diagnosis not present

## 2015-06-16 DIAGNOSIS — Z79899 Other long term (current) drug therapy: Secondary | ICD-10-CM | POA: Diagnosis not present

## 2015-06-16 DIAGNOSIS — Z91040 Latex allergy status: Secondary | ICD-10-CM | POA: Diagnosis not present

## 2015-06-16 DIAGNOSIS — Z880 Allergy status to penicillin: Secondary | ICD-10-CM | POA: Diagnosis not present

## 2015-06-17 DIAGNOSIS — Z85828 Personal history of other malignant neoplasm of skin: Secondary | ICD-10-CM | POA: Diagnosis not present

## 2015-06-17 DIAGNOSIS — Z79899 Other long term (current) drug therapy: Secondary | ICD-10-CM | POA: Diagnosis not present

## 2015-06-17 DIAGNOSIS — Z91013 Allergy to seafood: Secondary | ICD-10-CM | POA: Diagnosis not present

## 2015-06-17 DIAGNOSIS — R1312 Dysphagia, oropharyngeal phase: Secondary | ICD-10-CM | POA: Diagnosis not present

## 2015-06-17 DIAGNOSIS — Z8673 Personal history of transient ischemic attack (TIA), and cerebral infarction without residual deficits: Secondary | ICD-10-CM | POA: Diagnosis not present

## 2015-06-17 DIAGNOSIS — Z510 Encounter for antineoplastic radiation therapy: Secondary | ICD-10-CM | POA: Diagnosis not present

## 2015-06-17 DIAGNOSIS — Z91040 Latex allergy status: Secondary | ICD-10-CM | POA: Diagnosis not present

## 2015-06-17 DIAGNOSIS — Z8049 Family history of malignant neoplasm of other genital organs: Secondary | ICD-10-CM | POA: Diagnosis not present

## 2015-06-17 DIAGNOSIS — I10 Essential (primary) hypertension: Secondary | ICD-10-CM | POA: Diagnosis not present

## 2015-06-17 DIAGNOSIS — Z880 Allergy status to penicillin: Secondary | ICD-10-CM | POA: Diagnosis not present

## 2015-06-17 DIAGNOSIS — C07 Malignant neoplasm of parotid gland: Secondary | ICD-10-CM | POA: Diagnosis not present

## 2015-06-20 DIAGNOSIS — Z880 Allergy status to penicillin: Secondary | ICD-10-CM | POA: Diagnosis not present

## 2015-06-20 DIAGNOSIS — Z79899 Other long term (current) drug therapy: Secondary | ICD-10-CM | POA: Diagnosis not present

## 2015-06-20 DIAGNOSIS — C07 Malignant neoplasm of parotid gland: Secondary | ICD-10-CM | POA: Diagnosis not present

## 2015-06-20 DIAGNOSIS — Z8049 Family history of malignant neoplasm of other genital organs: Secondary | ICD-10-CM | POA: Diagnosis not present

## 2015-06-20 DIAGNOSIS — Z8673 Personal history of transient ischemic attack (TIA), and cerebral infarction without residual deficits: Secondary | ICD-10-CM | POA: Diagnosis not present

## 2015-06-20 DIAGNOSIS — Z91013 Allergy to seafood: Secondary | ICD-10-CM | POA: Diagnosis not present

## 2015-06-20 DIAGNOSIS — Z510 Encounter for antineoplastic radiation therapy: Secondary | ICD-10-CM | POA: Diagnosis not present

## 2015-06-20 DIAGNOSIS — I10 Essential (primary) hypertension: Secondary | ICD-10-CM | POA: Diagnosis not present

## 2015-06-20 DIAGNOSIS — Z85828 Personal history of other malignant neoplasm of skin: Secondary | ICD-10-CM | POA: Diagnosis not present

## 2015-06-20 DIAGNOSIS — Z91040 Latex allergy status: Secondary | ICD-10-CM | POA: Diagnosis not present

## 2015-06-21 ENCOUNTER — Encounter (HOSPITAL_COMMUNITY): Payer: Self-pay | Admitting: Hematology & Oncology

## 2015-06-21 DIAGNOSIS — I10 Essential (primary) hypertension: Secondary | ICD-10-CM | POA: Diagnosis not present

## 2015-06-21 DIAGNOSIS — Z91013 Allergy to seafood: Secondary | ICD-10-CM | POA: Diagnosis not present

## 2015-06-21 DIAGNOSIS — Z880 Allergy status to penicillin: Secondary | ICD-10-CM | POA: Diagnosis not present

## 2015-06-21 DIAGNOSIS — Z8049 Family history of malignant neoplasm of other genital organs: Secondary | ICD-10-CM | POA: Diagnosis not present

## 2015-06-21 DIAGNOSIS — Z510 Encounter for antineoplastic radiation therapy: Secondary | ICD-10-CM | POA: Diagnosis not present

## 2015-06-21 DIAGNOSIS — Z85828 Personal history of other malignant neoplasm of skin: Secondary | ICD-10-CM | POA: Diagnosis not present

## 2015-06-21 DIAGNOSIS — Z8673 Personal history of transient ischemic attack (TIA), and cerebral infarction without residual deficits: Secondary | ICD-10-CM | POA: Diagnosis not present

## 2015-06-21 DIAGNOSIS — C07 Malignant neoplasm of parotid gland: Secondary | ICD-10-CM | POA: Diagnosis not present

## 2015-06-21 DIAGNOSIS — Z79899 Other long term (current) drug therapy: Secondary | ICD-10-CM | POA: Diagnosis not present

## 2015-06-21 DIAGNOSIS — Z91040 Latex allergy status: Secondary | ICD-10-CM | POA: Diagnosis not present

## 2015-06-22 DIAGNOSIS — Z880 Allergy status to penicillin: Secondary | ICD-10-CM | POA: Diagnosis not present

## 2015-06-22 DIAGNOSIS — C07 Malignant neoplasm of parotid gland: Secondary | ICD-10-CM | POA: Diagnosis not present

## 2015-06-22 DIAGNOSIS — Z8673 Personal history of transient ischemic attack (TIA), and cerebral infarction without residual deficits: Secondary | ICD-10-CM | POA: Diagnosis not present

## 2015-06-22 DIAGNOSIS — Z91040 Latex allergy status: Secondary | ICD-10-CM | POA: Diagnosis not present

## 2015-06-22 DIAGNOSIS — I10 Essential (primary) hypertension: Secondary | ICD-10-CM | POA: Diagnosis not present

## 2015-06-22 DIAGNOSIS — Z85828 Personal history of other malignant neoplasm of skin: Secondary | ICD-10-CM | POA: Diagnosis not present

## 2015-06-22 DIAGNOSIS — Z91013 Allergy to seafood: Secondary | ICD-10-CM | POA: Diagnosis not present

## 2015-06-22 DIAGNOSIS — Z510 Encounter for antineoplastic radiation therapy: Secondary | ICD-10-CM | POA: Diagnosis not present

## 2015-06-22 DIAGNOSIS — Z8049 Family history of malignant neoplasm of other genital organs: Secondary | ICD-10-CM | POA: Diagnosis not present

## 2015-06-22 DIAGNOSIS — Z79899 Other long term (current) drug therapy: Secondary | ICD-10-CM | POA: Diagnosis not present

## 2015-06-23 DIAGNOSIS — Z880 Allergy status to penicillin: Secondary | ICD-10-CM | POA: Diagnosis not present

## 2015-06-23 DIAGNOSIS — Z85828 Personal history of other malignant neoplasm of skin: Secondary | ICD-10-CM | POA: Diagnosis not present

## 2015-06-23 DIAGNOSIS — C07 Malignant neoplasm of parotid gland: Secondary | ICD-10-CM | POA: Diagnosis not present

## 2015-06-23 DIAGNOSIS — Z79899 Other long term (current) drug therapy: Secondary | ICD-10-CM | POA: Diagnosis not present

## 2015-06-23 DIAGNOSIS — Z510 Encounter for antineoplastic radiation therapy: Secondary | ICD-10-CM | POA: Diagnosis not present

## 2015-06-23 DIAGNOSIS — Z91040 Latex allergy status: Secondary | ICD-10-CM | POA: Diagnosis not present

## 2015-06-23 DIAGNOSIS — Z91013 Allergy to seafood: Secondary | ICD-10-CM | POA: Diagnosis not present

## 2015-06-23 DIAGNOSIS — Z8049 Family history of malignant neoplasm of other genital organs: Secondary | ICD-10-CM | POA: Diagnosis not present

## 2015-06-23 DIAGNOSIS — Z8673 Personal history of transient ischemic attack (TIA), and cerebral infarction without residual deficits: Secondary | ICD-10-CM | POA: Diagnosis not present

## 2015-06-23 DIAGNOSIS — I10 Essential (primary) hypertension: Secondary | ICD-10-CM | POA: Diagnosis not present

## 2015-06-24 DIAGNOSIS — Z880 Allergy status to penicillin: Secondary | ICD-10-CM | POA: Diagnosis not present

## 2015-06-24 DIAGNOSIS — Z85828 Personal history of other malignant neoplasm of skin: Secondary | ICD-10-CM | POA: Diagnosis not present

## 2015-06-24 DIAGNOSIS — I10 Essential (primary) hypertension: Secondary | ICD-10-CM | POA: Diagnosis not present

## 2015-06-24 DIAGNOSIS — Z8673 Personal history of transient ischemic attack (TIA), and cerebral infarction without residual deficits: Secondary | ICD-10-CM | POA: Diagnosis not present

## 2015-06-24 DIAGNOSIS — Z79899 Other long term (current) drug therapy: Secondary | ICD-10-CM | POA: Diagnosis not present

## 2015-06-24 DIAGNOSIS — Z91040 Latex allergy status: Secondary | ICD-10-CM | POA: Diagnosis not present

## 2015-06-24 DIAGNOSIS — Z91013 Allergy to seafood: Secondary | ICD-10-CM | POA: Diagnosis not present

## 2015-06-24 DIAGNOSIS — Z8049 Family history of malignant neoplasm of other genital organs: Secondary | ICD-10-CM | POA: Diagnosis not present

## 2015-06-24 DIAGNOSIS — Z510 Encounter for antineoplastic radiation therapy: Secondary | ICD-10-CM | POA: Diagnosis not present

## 2015-06-24 DIAGNOSIS — C07 Malignant neoplasm of parotid gland: Secondary | ICD-10-CM | POA: Diagnosis not present

## 2015-06-27 DIAGNOSIS — C07 Malignant neoplasm of parotid gland: Secondary | ICD-10-CM | POA: Diagnosis not present

## 2015-06-27 DIAGNOSIS — Z510 Encounter for antineoplastic radiation therapy: Secondary | ICD-10-CM | POA: Diagnosis not present

## 2015-06-28 DIAGNOSIS — C07 Malignant neoplasm of parotid gland: Secondary | ICD-10-CM | POA: Diagnosis not present

## 2015-06-28 DIAGNOSIS — Z510 Encounter for antineoplastic radiation therapy: Secondary | ICD-10-CM | POA: Diagnosis not present

## 2015-06-29 DIAGNOSIS — Z510 Encounter for antineoplastic radiation therapy: Secondary | ICD-10-CM | POA: Diagnosis not present

## 2015-06-29 DIAGNOSIS — C07 Malignant neoplasm of parotid gland: Secondary | ICD-10-CM | POA: Diagnosis not present

## 2015-06-30 DIAGNOSIS — Z510 Encounter for antineoplastic radiation therapy: Secondary | ICD-10-CM | POA: Diagnosis not present

## 2015-06-30 DIAGNOSIS — C07 Malignant neoplasm of parotid gland: Secondary | ICD-10-CM | POA: Diagnosis not present

## 2015-07-04 DIAGNOSIS — Z510 Encounter for antineoplastic radiation therapy: Secondary | ICD-10-CM | POA: Diagnosis not present

## 2015-07-04 DIAGNOSIS — C07 Malignant neoplasm of parotid gland: Secondary | ICD-10-CM | POA: Diagnosis not present

## 2015-07-05 DIAGNOSIS — Z510 Encounter for antineoplastic radiation therapy: Secondary | ICD-10-CM | POA: Diagnosis not present

## 2015-07-05 DIAGNOSIS — C07 Malignant neoplasm of parotid gland: Secondary | ICD-10-CM | POA: Diagnosis not present

## 2015-07-06 DIAGNOSIS — Z510 Encounter for antineoplastic radiation therapy: Secondary | ICD-10-CM | POA: Diagnosis not present

## 2015-07-06 DIAGNOSIS — C07 Malignant neoplasm of parotid gland: Secondary | ICD-10-CM | POA: Diagnosis not present

## 2015-07-07 DIAGNOSIS — Z510 Encounter for antineoplastic radiation therapy: Secondary | ICD-10-CM | POA: Diagnosis not present

## 2015-07-07 DIAGNOSIS — C07 Malignant neoplasm of parotid gland: Secondary | ICD-10-CM | POA: Diagnosis not present

## 2015-07-08 DIAGNOSIS — C07 Malignant neoplasm of parotid gland: Secondary | ICD-10-CM | POA: Diagnosis not present

## 2015-07-08 DIAGNOSIS — Z510 Encounter for antineoplastic radiation therapy: Secondary | ICD-10-CM | POA: Diagnosis not present

## 2015-07-11 ENCOUNTER — Ambulatory Visit (HOSPITAL_COMMUNITY): Payer: Medicare Other | Admitting: Hematology & Oncology

## 2015-07-11 DIAGNOSIS — C07 Malignant neoplasm of parotid gland: Secondary | ICD-10-CM | POA: Diagnosis not present

## 2015-07-11 DIAGNOSIS — Z510 Encounter for antineoplastic radiation therapy: Secondary | ICD-10-CM | POA: Diagnosis not present

## 2015-07-12 ENCOUNTER — Other Ambulatory Visit: Payer: Self-pay

## 2015-07-12 DIAGNOSIS — C07 Malignant neoplasm of parotid gland: Secondary | ICD-10-CM | POA: Diagnosis not present

## 2015-07-12 DIAGNOSIS — Z510 Encounter for antineoplastic radiation therapy: Secondary | ICD-10-CM | POA: Diagnosis not present

## 2015-07-12 MED ORDER — OMEPRAZOLE 20 MG PO CPDR: DELAYED_RELEASE_CAPSULE | ORAL | Status: DC

## 2015-07-12 MED ORDER — LEVOCETIRIZINE DIHYDROCHLORIDE 5 MG PO TABS: 5.0000 mg | ORAL_TABLET | Freq: Every day | ORAL | Status: DC

## 2015-07-12 MED ORDER — ROPINIROLE HCL 1 MG PO TABS: ORAL_TABLET | ORAL | Status: DC

## 2015-07-13 ENCOUNTER — Inpatient Hospital Stay (HOSPITAL_COMMUNITY)
Admission: EM | Admit: 2015-07-13 | Discharge: 2015-07-15 | DRG: 309 | Disposition: A | Discharge status: Home Health | Payer: Medicare Other | Attending: Internal Medicine | Admitting: Internal Medicine

## 2015-07-13 ENCOUNTER — Emergency Department (HOSPITAL_COMMUNITY): Payer: Medicare Other

## 2015-07-13 ENCOUNTER — Encounter (HOSPITAL_COMMUNITY): Payer: Self-pay | Admitting: Emergency Medicine

## 2015-07-13 ENCOUNTER — Encounter: Payer: Self-pay | Admitting: Family Medicine

## 2015-07-13 ENCOUNTER — Ambulatory Visit (INDEPENDENT_AMBULATORY_CARE_PROVIDER_SITE_OTHER): Payer: Medicare Other | Admitting: Family Medicine

## 2015-07-13 VITALS — BP 117/67 | HR 124 | Ht <= 58 in | Wt 176.2 lb

## 2015-07-13 DIAGNOSIS — I4891 Unspecified atrial fibrillation: Principal | ICD-10-CM | POA: Diagnosis present

## 2015-07-13 DIAGNOSIS — J449 Chronic obstructive pulmonary disease, unspecified: Secondary | ICD-10-CM

## 2015-07-13 DIAGNOSIS — Z510 Encounter for antineoplastic radiation therapy: Secondary | ICD-10-CM | POA: Diagnosis not present

## 2015-07-13 DIAGNOSIS — I10 Essential (primary) hypertension: Secondary | ICD-10-CM | POA: Diagnosis not present

## 2015-07-13 DIAGNOSIS — Z8049 Family history of malignant neoplasm of other genital organs: Secondary | ICD-10-CM | POA: Diagnosis not present

## 2015-07-13 DIAGNOSIS — R0600 Dyspnea, unspecified: Secondary | ICD-10-CM | POA: Diagnosis present

## 2015-07-13 DIAGNOSIS — M1990 Unspecified osteoarthritis, unspecified site: Secondary | ICD-10-CM | POA: Diagnosis present

## 2015-07-13 DIAGNOSIS — R0602 Shortness of breath: Secondary | ICD-10-CM | POA: Diagnosis not present

## 2015-07-13 DIAGNOSIS — I959 Hypotension, unspecified: Secondary | ICD-10-CM | POA: Diagnosis not present

## 2015-07-13 DIAGNOSIS — Z86718 Personal history of other venous thrombosis and embolism: Secondary | ICD-10-CM | POA: Diagnosis not present

## 2015-07-13 DIAGNOSIS — Z923 Personal history of irradiation: Secondary | ICD-10-CM

## 2015-07-13 DIAGNOSIS — Z96643 Presence of artificial hip joint, bilateral: Secondary | ICD-10-CM | POA: Diagnosis not present

## 2015-07-13 DIAGNOSIS — Z7901 Long term (current) use of anticoagulants: Secondary | ICD-10-CM

## 2015-07-13 DIAGNOSIS — C07 Malignant neoplasm of parotid gland: Secondary | ICD-10-CM | POA: Diagnosis not present

## 2015-07-13 DIAGNOSIS — I248 Other forms of acute ischemic heart disease: Secondary | ICD-10-CM | POA: Diagnosis not present

## 2015-07-13 DIAGNOSIS — K219 Gastro-esophageal reflux disease without esophagitis: Secondary | ICD-10-CM | POA: Diagnosis present

## 2015-07-13 DIAGNOSIS — J438 Other emphysema: Secondary | ICD-10-CM | POA: Diagnosis not present

## 2015-07-13 DIAGNOSIS — R079 Chest pain, unspecified: Secondary | ICD-10-CM | POA: Diagnosis not present

## 2015-07-13 DIAGNOSIS — E876 Hypokalemia: Secondary | ICD-10-CM | POA: Diagnosis not present

## 2015-07-13 DIAGNOSIS — J430 Unilateral pulmonary emphysema [MacLeod's syndrome]: Secondary | ICD-10-CM | POA: Diagnosis not present

## 2015-07-13 LAB — CBC WITH DIFFERENTIAL/PLATELET
BASOS ABS: 0 10*3/uL (ref 0.0–0.1)
BASOS PCT: 0 %
EOS PCT: 1 %
Eosinophils Absolute: 0.1 10*3/uL (ref 0.0–0.7)
HEMATOCRIT: 36.1 % (ref 36.0–46.0)
Hemoglobin: 12.1 g/dL (ref 12.0–15.0)
Lymphocytes Relative: 9 %
Lymphs Abs: 0.7 10*3/uL (ref 0.7–4.0)
MCH: 32.1 pg (ref 26.0–34.0)
MCHC: 33.5 g/dL (ref 30.0–36.0)
MCV: 95.8 fL (ref 78.0–100.0)
MONO ABS: 1.7 10*3/uL — AB (ref 0.1–1.0)
MONOS PCT: 23 %
Neutro Abs: 5 10*3/uL (ref 1.7–7.7)
Neutrophils Relative %: 67 %
PLATELETS: 204 10*3/uL (ref 150–400)
RBC: 3.77 MIL/uL — ABNORMAL LOW (ref 3.87–5.11)
RDW: 13.4 % (ref 11.5–15.5)
WBC: 7.4 10*3/uL (ref 4.0–10.5)

## 2015-07-13 LAB — BASIC METABOLIC PANEL
Anion gap: 10 (ref 5–15)
BUN: 14 mg/dL (ref 6–20)
CO2: 27 mmol/L (ref 22–32)
CREATININE: 1.29 mg/dL — AB (ref 0.44–1.00)
Calcium: 8.8 mg/dL — ABNORMAL LOW (ref 8.9–10.3)
Chloride: 100 mmol/L — ABNORMAL LOW (ref 101–111)
GFR, EST AFRICAN AMERICAN: 42 mL/min — AB (ref >60)
GFR, EST NON AFRICAN AMERICAN: 36 mL/min — AB (ref >60)
Glucose, Bld: 143 mg/dL — ABNORMAL HIGH (ref 65–99)
Potassium: 2.9 mmol/L — ABNORMAL LOW (ref 3.5–5.1)
SODIUM: 137 mmol/L (ref 135–145)

## 2015-07-13 LAB — TROPONIN I
TROPONIN I: 0.04 ng/mL — AB (ref <0.031)
Troponin I: 0.04 ng/mL — ABNORMAL HIGH (ref <0.031)

## 2015-07-13 LAB — BRAIN NATRIURETIC PEPTIDE: B NATRIURETIC PEPTIDE 5: 669 pg/mL — AB (ref 0.0–100.0)

## 2015-07-13 MED ORDER — SODIUM CHLORIDE 0.9 % IJ SOLN
3.0000 mL | Freq: Two times a day (BID) | INTRAMUSCULAR | Status: DC
  Administered 2015-07-13: 3 mL via INTRAVENOUS

## 2015-07-13 MED ORDER — DEXTROSE 5 % IV SOLN
INTRAVENOUS | Status: AC
  Filled 2015-07-13: qty 100

## 2015-07-13 MED ORDER — NITROGLYCERIN 0.4 MG SL SUBL
0.4000 mg | SUBLINGUAL_TABLET | SUBLINGUAL | Status: AC | PRN
  Administered 2015-07-13 (×3): 0.4 mg via SUBLINGUAL
  Filled 2015-07-13: qty 1

## 2015-07-13 MED ORDER — LEVOCETIRIZINE DIHYDROCHLORIDE 5 MG PO TABS: 5.0000 mg | ORAL_TABLET | Freq: Every day | ORAL | Status: DC

## 2015-07-13 MED ORDER — POTASSIUM CHLORIDE CRYS ER 20 MEQ PO TBCR
20.0000 meq | EXTENDED_RELEASE_TABLET | Freq: Every day | ORAL | Status: DC
  Administered 2015-07-14 – 2015-07-15 (×2): 20 meq via ORAL
  Filled 2015-07-13 (×2): qty 1

## 2015-07-13 MED ORDER — TRIAMCINOLONE ACETONIDE 0.1 % EX CREA
TOPICAL_CREAM | Freq: Two times a day (BID) | CUTANEOUS | Status: DC
  Administered 2015-07-14 – 2015-07-15 (×2): via TOPICAL
  Filled 2015-07-13: qty 15

## 2015-07-13 MED ORDER — VITAMIN D 1000 UNITS PO TABS
2000.0000 [IU] | ORAL_TABLET | Freq: Every day | ORAL | Status: DC
  Administered 2015-07-14 – 2015-07-15 (×2): 2000 [IU] via ORAL
  Filled 2015-07-13 (×2): qty 2

## 2015-07-13 MED ORDER — POTASSIUM CHLORIDE CRYS ER 20 MEQ PO TBCR: 20.0000 meq | EXTENDED_RELEASE_TABLET | Freq: Every day | ORAL | Status: DC

## 2015-07-13 MED ORDER — APIXABAN 2.5 MG PO TABS
2.5000 mg | ORAL_TABLET | Freq: Two times a day (BID) | ORAL | Status: DC
  Administered 2015-07-13 – 2015-07-14 (×2): 2.5 mg via ORAL
  Filled 2015-07-13 (×4): qty 1

## 2015-07-13 MED ORDER — AMLODIPINE BESYLATE 5 MG PO TABS: 2.5000 mg | ORAL_TABLET | Freq: Every day | ORAL | Status: DC

## 2015-07-13 MED ORDER — ONDANSETRON HCL 4 MG/2ML IJ SOLN: 4.0000 mg | Freq: Four times a day (QID) | INTRAMUSCULAR | Status: DC | PRN

## 2015-07-13 MED ORDER — SODIUM CHLORIDE 0.9 % IV SOLN
INTRAVENOUS | Status: AC
  Administered 2015-07-13: 21:00:00 via INTRAVENOUS

## 2015-07-13 MED ORDER — CALCIUM CARBONATE-VITAMIN D 500-200 MG-UNIT PO TABS
2.0000 | ORAL_TABLET | Freq: Every day | ORAL | Status: DC
  Administered 2015-07-14 – 2015-07-15 (×2): 2 via ORAL
  Filled 2015-07-13 (×3): qty 2

## 2015-07-13 MED ORDER — FUROSEMIDE 40 MG PO TABS
40.0000 mg | ORAL_TABLET | Freq: Two times a day (BID) | ORAL | Status: DC
Start: 2015-07-13 — End: 2015-07-14
  Administered 2015-07-13: 40 mg via ORAL
  Filled 2015-07-13 (×2): qty 1

## 2015-07-13 MED ORDER — POTASSIUM CHLORIDE CRYS ER 20 MEQ PO TBCR
40.0000 meq | EXTENDED_RELEASE_TABLET | Freq: Once | ORAL | Status: AC
  Administered 2015-07-13: 40 meq via ORAL
  Filled 2015-07-13: qty 2

## 2015-07-13 MED ORDER — ONDANSETRON HCL 4 MG PO TABS: 4.0000 mg | ORAL_TABLET | Freq: Four times a day (QID) | ORAL | Status: DC | PRN

## 2015-07-13 MED ORDER — LORATADINE 10 MG PO TABS
10.0000 mg | ORAL_TABLET | Freq: Every day | ORAL | Status: DC
  Administered 2015-07-13 – 2015-07-14 (×2): 10 mg via ORAL
  Filled 2015-07-13 (×2): qty 1

## 2015-07-13 MED ORDER — AMITRIPTYLINE HCL 25 MG PO TABS
75.0000 mg | ORAL_TABLET | Freq: Every day | ORAL | Status: DC
  Administered 2015-07-13 – 2015-07-14 (×2): 75 mg via ORAL
  Filled 2015-07-13 (×2): qty 3

## 2015-07-13 MED ORDER — ASPIRIN 81 MG PO CHEW
324.0000 mg | CHEWABLE_TABLET | Freq: Once | ORAL | Status: AC
  Administered 2015-07-13: 324 mg via ORAL
  Filled 2015-07-13: qty 4

## 2015-07-13 MED ORDER — DONEPEZIL HCL 5 MG PO TABS
10.0000 mg | ORAL_TABLET | Freq: Every day | ORAL | Status: DC
  Administered 2015-07-13 – 2015-07-14 (×2): 10 mg via ORAL
  Filled 2015-07-13 (×2): qty 2

## 2015-07-13 MED ORDER — DILTIAZEM HCL 100 MG IV SOLR
5.0000 mg/h | INTRAVENOUS | Status: DC
  Administered 2015-07-13: 5 mg/h via INTRAVENOUS
  Administered 2015-07-13: 15 mg/h via INTRAVENOUS
  Administered 2015-07-14: 10 mg/h via INTRAVENOUS
  Filled 2015-07-13 (×3): qty 100

## 2015-07-13 MED ORDER — OXYCODONE-ACETAMINOPHEN 5-325 MG PO TABS
1.0000 | ORAL_TABLET | ORAL | Status: DC | PRN
  Administered 2015-07-13: 1 via ORAL
  Filled 2015-07-13: qty 1

## 2015-07-13 MED ORDER — DILTIAZEM LOAD VIA INFUSION
10.0000 mg | Freq: Once | INTRAVENOUS | Status: AC
  Administered 2015-07-13: 10 mg via INTRAVENOUS

## 2015-07-13 MED ORDER — PANTOPRAZOLE SODIUM 40 MG PO TBEC
40.0000 mg | DELAYED_RELEASE_TABLET | Freq: Every day | ORAL | Status: DC
  Administered 2015-07-13 – 2015-07-15 (×3): 40 mg via ORAL
  Filled 2015-07-13 (×3): qty 1

## 2015-07-13 MED ORDER — GABAPENTIN 300 MG PO CAPS
300.0000 mg | ORAL_CAPSULE | Freq: Two times a day (BID) | ORAL | Status: DC
  Administered 2015-07-13 – 2015-07-15 (×4): 300 mg via ORAL
  Filled 2015-07-13 (×4): qty 1

## 2015-07-13 MED ORDER — AMLODIPINE BESYLATE 5 MG PO TABS
2.5000 mg | ORAL_TABLET | Freq: Every day | ORAL | Status: DC
  Filled 2015-07-13: qty 1

## 2015-07-13 MED ORDER — ROPINIROLE HCL 1 MG PO TABS
2.0000 mg | ORAL_TABLET | Freq: Every day | ORAL | Status: DC
  Administered 2015-07-14: 2 mg via ORAL
  Filled 2015-07-13 (×3): qty 2

## 2015-07-13 MED ORDER — TRAMADOL HCL 50 MG PO TABS
50.0000 mg | ORAL_TABLET | Freq: Two times a day (BID) | ORAL | Status: DC | PRN
  Administered 2015-07-14 – 2015-07-15 (×2): 50 mg via ORAL
  Filled 2015-07-13 (×2): qty 1

## 2015-07-13 MED ORDER — CITALOPRAM HYDROBROMIDE 20 MG PO TABS
20.0000 mg | ORAL_TABLET | Freq: Every day | ORAL | Status: DC
  Administered 2015-07-14 – 2015-07-15 (×2): 20 mg via ORAL
  Filled 2015-07-13 (×2): qty 1

## 2015-07-13 NOTE — ED Notes (Signed)
[name] Walker states receiving RN for ICU room 4, will return phone call for report.

## 2015-07-13 NOTE — H&P (Signed)
[facility] History and Physical  BASYA CASAVANT GYI:[id number] DOB: [birth year] DOA: 07/13/2015  Referring physician: ER PCP: Claretta Fraise, MD   Chief Complaint: Chest pain, dyspnea  HPI: [name] Walker is a 81 y.o. female  This is an 81 year old lady who is on anticoagulation therapy for DVT and now presents with a 2 day history of chest pain associated with dyspnea. She described the chest pain is aching with radiation up to her neck. She was seen by her primary care physician today who noticed that her heart rate was rapid and referred the patient to the emergency room. The patient denies any nausea, vomiting or abdominal pain. There is no cough or fever. She has not had any syncopal episodes. Evaluation in the emergency room showed her to be in atrial fibrillation with rapid ventricular response. She is now being admitted for further management.   Review of Systems:  Apart from symptoms above, all systems are negative.  Past Medical History  Diagnosis Date  . DVT (deep venous thrombosis) ([place])   . Hypertension   . COPD (chronic obstructive pulmonary disease) ([place])   . GERD (gastroesophageal reflux disease)   . Osteoarthritis   . Ischemic colitis ([place])   . Depression   . Gastric erosion   . Insomnia   . Cataract   . Parotid mass 2016   Past Surgical History  Procedure Laterality Date  . Total hip arthroplasty      bilateral  . Knee arthroplasty      bilateral  . Esophagogastroduodenoscopy  10/17/2007    erosion   . Colonoscopy    . Back surgery    . Abdominal hysterectomy    . Cholecystectomy    . Cataracts    . Parotidectomy Left 05/17/2015    Procedure: PAROTIDECTOMY- LEFT TOTAL;  Surgeon: Leta Baptist, MD;  Location: [facility];  Service: ENT;  Laterality: Left;   Social History:  reports that she has never smoked. She has never used smokeless tobacco. She reports that she does not drink alcohol or use illicit drugs.  Allergies    Allergen Reactions  . Iohexol      Desc: THROAT SWELLING-NOTED IN CHART AFTER IVC PLACEMENT-ARS 12-02-05   . Latex Other (See Comments)    unknown  . Penicillins Swelling  . Shellfish Allergy Other (See Comments)    Reaction unknown    Family History  Problem Relation Age of Onset  . Rheum arthritis Mother   . Ulcers Mother     on foot  . Ulcers Father   . Cancer Sister     uterine    Prior to Admission medications   Medication Sig Start Date End Date Taking? Authorizing Provider  amitriptyline (ELAVIL) 75 MG tablet TAKE 1 TABLETS AT BEDTIME 04/06/15  Yes Claretta Fraise, MD  amLODipine (NORVASC) 2.5 MG tablet TAKE 1 TABLET IN MORNING 03/10/15  Yes Claretta Fraise, MD  calcium-vitamin D 250-100 MG-UNIT per tablet Take 2 tablets by mouth daily. 05/13/15  Yes Tammy Eckard, PHARMD  Cholecalciferol (VITAMIN D3) 2000 UNITS CHEW Chew 1 tablet by mouth daily.   Yes Historical Provider, MD  citalopram (CELEXA) 20 MG tablet Take 1 tablet by mouth  daily 05/31/15  Yes Claretta Fraise, MD  donepezil (ARICEPT) 10 MG tablet Take 1 tablet (10 mg total) by mouth at bedtime. 06/03/15  Yes Claretta Fraise, MD  ELIQUIS 2.5 MG TABS tablet Take 2.5 mg by mouth 2 (two) times daily. 03/24/15  Yes Historical Provider, MD  furosemide (LASIX) 40 MG tablet TAKE  (1)  TABLET TWICE A DAY. Patient taking differently: Take 40 mg by mouth 2 (two) times daily.  03/10/15  Yes Claretta Fraise, MD  gabapentin (NEURONTIN) 300 MG capsule TAKE 1 CAPSULE TWICE DAILY. 05/31/15  Yes Claretta Fraise, MD  levocetirizine (XYZAL) 5 MG tablet Take 1 tablet (5 mg total) by mouth at bedtime. 07/12/15  Yes Chipper Herb, MD  omeprazole (PRILOSEC) 20 MG capsule TAKE 2 CAPSULES DAILY AS NEEDED FOR REFLUX Patient taking differently: Take 40 mg by mouth daily as needed. NEEDED FOR REFLUX 07/12/15  Yes Chipper Herb, MD  potassium chloride (K-DUR,KLOR-CON) 10 MEQ tablet TAKE (2) TABLETS DAILY Patient taking differently: Take 20 mEq by mouth daily.   03/10/15  Yes Claretta Fraise, MD  rOPINIRole (REQUIP) 1 MG tablet TAKE 1 OR 2 TABLETS AT  BEDTIME Patient taking differently: Take 2 mg by mouth at bedtime.  07/12/15  Yes Chipper Herb, MD  triamcinolone cream (KENALOG) 0.1 % Apply 1 application topically daily.   Yes Mary-Margaret Hassell Done, FNP  clindamycin (CLEOCIN) 300 MG capsule Take 1 capsule (300 mg total) by mouth 3 (three) times daily. Patient not taking: Reported on 07/13/2015 05/17/15   Leta Baptist, MD  oxyCODONE-acetaminophen (ROXICET) 5-325 MG per tablet Take 1 tablet by mouth every 4 (four) hours as needed for severe pain. 05/17/15   Leta Baptist, MD  traMADol (ULTRAM) 50 MG tablet TAKE (1) TABLET DAILY AS NEEDED. Patient taking differently: TAKE (1) TABLET DAILY AS NEEDED FOR PAIN 05/11/15   Claretta Fraise, MD   Physical Exam: Filed Vitals:   07/13/15 1815 07/13/15 1816 07/13/15 1830 07/13/15 1905  BP: 121/107 121/107 116/73 123/59  Pulse: 25 108 107 111  Temp:      TempSrc:      Resp: 28 24 29 22   Height:      Weight:      SpO2: 87% 96% 89% 83%    Wt Readings from Last 3 Encounters:  07/13/15 79.833 kg (176 lb)  07/13/15 79.924 kg (176 lb 3.2 oz)  06/10/15 81.829 kg (180 lb 6.4 oz)    General:  Appears calm and comfortable Eyes: PERRL, normal lids, irises & conjunctiva ENT: grossly normal hearing, lips & tongue Neck: no LAD, masses or thyromegaly Cardiovascular: Irregularly irregular, consistent with age or fibrillation. The patient is not clinically in heart failure. Telemetry: Atrial fibrillation with rapid ventricular response. Respiratory: CTA bilaterally, no w/r/r. Normal respiratory effort. Abdomen: soft, ntnd Skin: no rash or induration seen on limited exam Musculoskeletal: grossly normal tone BUE/BLE Psychiatric: grossly normal mood and affect, speech fluent and appropriate Neurologic: grossly non-focal.          Labs on Admission:  Basic Metabolic Panel:  Recent Labs Lab 07/13/15 1640  NA 137  K 2.9*  CL  100*  CO2 27  GLUCOSE 143*  BUN 14  CREATININE 1.29*  CALCIUM 8.8*   Liver Function Tests: No results for input(s): AST, ALT, ALKPHOS, BILITOT, PROT, ALBUMIN in the last 168 hours. No results for input(s): LIPASE, AMYLASE in the last 168 hours. No results for input(s): AMMONIA in the last 168 hours. CBC:  Recent Labs Lab 07/13/15 1640  WBC 7.4  NEUTROABS 5.0  HGB 12.1  HCT 36.1  MCV 95.8  PLT 204   Cardiac Enzymes:  Recent Labs Lab 07/13/15 1640  TROPONINI 0.04*    BNP (last 3 results)  Recent Labs  07/13/15 1640  BNP 669.0*  ProBNP (last 3 results) No results for input(s): PROBNP in the last 8760 hours.  CBG: No results for input(s): GLUCAP in the last 168 hours.  Radiological Exams on Admission: Dg Chest Port 1 View  07/13/2015  CLINICAL DATA:  Acute chest pain and shortness of breath. EXAM: PORTABLE CHEST 1 VIEW COMPARISON:  July 22, 2013. FINDINGS: Stable cardiomediastinal silhouette. No pneumothorax or pleural effusion is noted. Both lungs are clear. The visualized skeletal structures are unremarkable. IMPRESSION: No acute cardiopulmonary abnormality seen. Electronically Signed   By: Marijo Conception, M.D.   On: 07/13/2015 17:18    EKG: Independently reviewed. Atrial fibrillation with rapid ventricular response per and there is no acute ST-T wave changes.  Assessment/Plan   1. Atrial fibrillation with rapid ventricular response . The patient has been started on Cardizem drip. Continue with chronic anticoagulation therapy. Echocardiogram. Cardiology consultation. 2. Hypertension. Stable. 3. COPD. This is stable also. 4. History of DVT. This is the reason it appears that she is on anti-coagulation therapy.  She will be admitted to stepdown unit. Further recommendations will depend on patient's hospital progress.  Code Status: Full code.  DVT Prophylaxis: Chronic anticoagulation.  Family Communication: I discussed the plan with the patient at  the bedside.   Disposition Plan: Home when medically stable.   Time spent: 60 minutes.  Doree Albee [facility] Pager [phone].

## 2015-07-13 NOTE — Progress Notes (Signed)
Subjective:    Patient ID: [name] Walker, female    DOB: 06-23-1925, 81 y.o.   MRN: [id number]  HPI 81 year old female with a parotid tumor who has just finished her radiation therapy. She was noted to be hypoxic yesterday and today she feels short of breath and has some chest discomfort. There is no history of heart disease but she does have COPD.  Her initial O2 sat here was 94% but when she was seated in the examining room it dropped to 83%. She didn't appear dyspneic. Her chest pain was nonspecific , nonradiating ,  Not associated with nausea vomiting or diaphoresis.    Review of Systems  Constitutional: Positive for fatigue.  Respiratory: Positive for shortness of breath.   Cardiovascular: Positive for chest pain.  Neurological: Positive for weakness.  Psychiatric/Behavioral: Positive for confusion.    Patient Active Problem List   Diagnosis Date Noted  . H/O parotidectomy 05/17/2015  . Osteopenia 05/13/2015  . Weakness 07/15/2013  . Cellulitis 03/02/2012  . ANEMIA, NORMOCYTIC 01/06/2009  . DEPRESSION 01/06/2009  . Essential hypertension 01/06/2009  . COPD (chronic obstructive pulmonary disease) ([place]) 01/06/2009  . GERD 01/06/2009  . IRRITABLE BOWEL SYNDROME 01/06/2009   Outpatient Encounter Prescriptions as of 07/13/2015  Medication Sig  . amitriptyline (ELAVIL) 75 MG tablet TAKE 1 TABLETS AT BEDTIME  . amLODipine (NORVASC) 2.5 MG tablet TAKE 1 TABLET IN MORNING  . calcium-vitamin D 250-100 MG-UNIT per tablet Take 2 tablets by mouth daily.  . Cholecalciferol (VITAMIN D3) 2000 UNITS CHEW Chew 1 tablet by mouth daily.  . citalopram (CELEXA) 20 MG tablet Take 1 tablet by mouth  daily  . clindamycin (CLEOCIN) 300 MG capsule Take 1 capsule (300 mg total) by mouth 3 (three) times daily.  Marland Kitchen donepezil (ARICEPT) 10 MG tablet Take 1 tablet (10 mg total) by mouth at bedtime.  Marland Kitchen ELIQUIS 2.5 MG TABS tablet Take 2.5 mg by mouth 2 (two) times daily.  . furosemide (LASIX) 40 MG  tablet TAKE  (1)  TABLET TWICE A DAY.  Marland Kitchen gabapentin (NEURONTIN) 300 MG capsule TAKE 1 CAPSULE TWICE DAILY.  Marland Kitchen levocetirizine (XYZAL) 5 MG tablet Take 1 tablet (5 mg total) by mouth at bedtime.  Marland Kitchen omeprazole (PRILOSEC) 20 MG capsule TAKE 2 CAPSULES DAILY AS NEEDED FOR REFLUX  . oxyCODONE-acetaminophen (ROXICET) 5-325 MG per tablet Take 1 tablet by mouth every 4 (four) hours as needed for severe pain.  . potassium chloride (K-DUR,KLOR-CON) 10 MEQ tablet TAKE (2) TABLETS DAILY  . rOPINIRole (REQUIP) 1 MG tablet TAKE 1 OR 2 TABLETS AT  BEDTIME  . traMADol (ULTRAM) 50 MG tablet TAKE (1) TABLET DAILY AS NEEDED.  Marland Kitchen triamcinolone cream (KENALOG) 0.1 % Apply 1 application topically daily.   No facility-administered encounter medications on file as of 07/13/2015.       Objective:   Physical Exam  Constitutional: She appears well-developed and well-nourished.  Cardiovascular:  Heart rate varied as I listen from normal rate to tachycardic and irregular.   EKG showed atrial fibrillation with occasional ectopic ventricular beat and nonspecific T-wave abnormalities    Pulmonary/Chest: Effort normal and breath sounds normal.          Assessment & Plan:  1. Chronic obstructive pulmonary disease, unspecified COPD type ([place])  - EKG 12-Lead - DG Chest 2 View; Future  2. SOB (shortness of breath) EKG shows A fib with rapid response.  Pt has been on Eliquis for DVT and coumadine prior to that.  Discussed with  Dr Elberta Spaniel at [facility] who recommended ER care - EKG 12-Lead - DG Chest 2 View; Future  Wardell Honour MD

## 2015-07-13 NOTE — ED Notes (Signed)
Attempted to call report to ICU, RN unable at this time. RN to call this RN back.

## 2015-07-13 NOTE — ED Notes (Signed)
[facility] at [facility] pt was co some sob, and chest discomfort - Went to see her primary MD today and was instructed to come to ER as HR fast

## 2015-07-13 NOTE — ED Provider Notes (Signed)
CSN: [id number]     Arrival date & time 07/13/15  3 History   First MD Initiated Contact with Patient 07/13/15 1642     Chief Complaint  Patient presents with  . Atrial Fibrillation      HPI Pt was seen at 1645. Per pt, c/o gradual onset and persistence of constant chest "pain" since yesterday. Has been associated with SOB. Describes the CP as "aching," with radiation up into her neck. Pt was evaluated by her PMD today, told her "HR was too fast" and to come to the ED for further evaluation. Pt denies back pain, no abd pain, no N/V/D, no fevers, no rash, no cough.   Past Medical History  Diagnosis Date  . DVT (deep venous thrombosis) ([place])   . Hypertension   . COPD (chronic obstructive pulmonary disease) ([place])   . GERD (gastroesophageal reflux disease)   . Osteoarthritis   . Ischemic colitis ([place])   . Depression   . Gastric erosion   . Insomnia   . Cataract   . Parotid mass 2016   Past Surgical History  Procedure Laterality Date  . Total hip arthroplasty      bilateral  . Knee arthroplasty      bilateral  . Esophagogastroduodenoscopy  10/17/2007    erosion   . Colonoscopy    . Back surgery    . Abdominal hysterectomy    . Cholecystectomy    . Cataracts    . Parotidectomy Left 05/17/2015    Procedure: PAROTIDECTOMY- LEFT TOTAL;  Surgeon: Leta Baptist, MD;  Location: [facility];  Service: ENT;  Laterality: Left;   Family History  Problem Relation Age of Onset  . Rheum arthritis Mother   . Ulcers Mother     on foot  . Ulcers Father   . Cancer Sister     uterine   Social History  Substance Use Topics  . Smoking status: Never Smoker   . Smokeless tobacco: Never Used  . Alcohol Use: No    Review of Systems ROS: Statement: All systems negative except as marked or noted in the HPI; Constitutional: Negative for fever and chills. ; ; Eyes: Negative for eye pain, redness and discharge. ; ; ENMT: Negative for ear pain, hoarseness, nasal congestion, sinus  pressure and sore throat. ; ; Cardiovascular: +CP, SOB. Negative for palpitations, diaphoresis, and peripheral edema. ; ; Respiratory: Negative for cough, wheezing and stridor. ; ; Gastrointestinal: Negative for nausea, vomiting, diarrhea, abdominal pain, blood in stool, hematemesis, jaundice and rectal bleeding. . ; ; Genitourinary: Negative for dysuria, flank pain and hematuria. ; ; Musculoskeletal: Negative for back pain and neck pain. Negative for swelling and trauma.; ; Skin: Negative for pruritus, rash, abrasions, blisters, bruising and skin lesion.; ; Neuro: Negative for headache, lightheadedness and neck stiffness. Negative for weakness, altered level of consciousness , altered mental status, extremity weakness, paresthesias, involuntary movement, seizure and syncope.      Allergies  Iohexol; Latex; Penicillins; and Shellfish allergy  Home Medications   Prior to Admission medications   Medication Sig Start Date End Date Taking? Authorizing Provider  amitriptyline (ELAVIL) 75 MG tablet TAKE 1 TABLETS AT BEDTIME 04/06/15   Claretta Fraise, MD  amLODipine (NORVASC) 2.5 MG tablet TAKE 1 TABLET IN [facility] 03/10/15   Claretta Fraise, MD  calcium-vitamin D 250-100 MG-UNIT per tablet Take 2 tablets by mouth daily. 05/13/15   Tammy Eckard, PHARMD  Cholecalciferol (VITAMIN D3) 2000 UNITS CHEW Chew 1 tablet by mouth daily.  Historical Provider, MD  citalopram (CELEXA) 20 MG tablet Take 1 tablet by mouth  daily 05/31/15   Claretta Fraise, MD  clindamycin (CLEOCIN) 300 MG capsule Take 1 capsule (300 mg total) by mouth 3 (three) times daily. 05/17/15   Leta Baptist, MD  donepezil (ARICEPT) 10 MG tablet Take 1 tablet (10 mg total) by mouth at bedtime. 06/03/15   Claretta Fraise, MD  ELIQUIS 2.5 MG TABS tablet Take 2.5 mg by mouth 2 (two) times daily. 03/24/15   Historical Provider, MD  furosemide (LASIX) 40 MG tablet TAKE  (1)  TABLET TWICE A DAY. 03/10/15   Claretta Fraise, MD  gabapentin (NEURONTIN) 300 MG capsule TAKE 1  CAPSULE TWICE DAILY. 05/31/15   Claretta Fraise, MD  levocetirizine (XYZAL) 5 MG tablet Take 1 tablet (5 mg total) by mouth at bedtime. 07/12/15   Chipper Herb, MD  omeprazole (PRILOSEC) 20 MG capsule TAKE 2 CAPSULES DAILY AS NEEDED FOR REFLUX 07/12/15   Chipper Herb, MD  oxyCODONE-acetaminophen (ROXICET) 5-325 MG per tablet Take 1 tablet by mouth every 4 (four) hours as needed for severe pain. 05/17/15   Leta Baptist, MD  potassium chloride (K-DUR,KLOR-CON) 10 MEQ tablet TAKE (2) TABLETS DAILY 03/10/15   Claretta Fraise, MD  rOPINIRole (REQUIP) 1 MG tablet TAKE 1 OR 2 TABLETS AT  BEDTIME 07/12/15   Chipper Herb, MD  traMADol (ULTRAM) 50 MG tablet TAKE (1) TABLET DAILY AS NEEDED. 05/11/15   Claretta Fraise, MD  triamcinolone cream (KENALOG) 0.1 % Apply 1 application topically daily.    Mary-Margaret Hassell Done, FNP   BP 115/92 mmHg  Pulse 110  Temp(Src) 98.4 F (36.9 C) (Oral)  Resp 23  Ht 4\' 10"  (1.473 m)  Wt 176 lb (79.833 kg)  BMI 36.79 kg/m2  SpO2 98%   Patient Vitals for the past 24 hrs:  BP Temp Temp src Pulse Resp SpO2 Height Weight  07/13/15 1800 116/57 mmHg - - 79 25 95 % - -  07/13/15 1745 115/92 mmHg - - 110 23 98 % - -  07/13/15 1731 119/68 mmHg - - 108 (!) 28 99 % - -  07/13/15 1700 (!) 130/103 mmHg - - (!) 126 17 (!) 80 % - -  07/13/15 1632 - 98.4 F (36.9 C) Oral (!) 136 18 92 % 4\' 10"  (1.473 m) 176 lb (79.833 kg)   Filed Vitals:   07/13/15 1812 07/13/15 1815 07/13/15 1816 07/13/15 1830  BP: 106/52 121/107 121/107 116/73  Pulse: 71 25 108 107  Temp:      TempSrc:      Resp: 24 28 24 29   Height:      Weight:      SpO2: 94% 87% 96% 89%    Physical Exam  1650: Physical examination:  Nursing notes reviewed; Vital signs and O2 SAT reviewed;  Constitutional: Well developed, Well nourished, Well hydrated, In no acute distress; Head:  Normocephalic, atraumatic; Eyes: EOMI, PERRL, No scleral icterus; ENMT: Mouth and pharynx normal, Mucous membranes moist; Neck: Supple, Full range of  motion, No lymphadenopathy; Cardiovascular: Tachycardic rate and irregular rhythm, No gallop; Respiratory: Breath sounds clear & equal bilaterally, No wheezes.  Speaking full sentences with ease, Normal respiratory effort/excursion; Chest: Nontender, Movement normal; Abdomen: Soft, Nontender, Nondistended, Normal bowel sounds; Genitourinary: No CVA tenderness; Extremities: Pulses normal, No tenderness, No edema, No calf edema or asymmetry.; Neuro: AA&Ox3, Major CN grossly intact.  Speech clear. No gross focal motor deficits in extremities.; Skin: Color normal, Warm, Dry.  ED Course  Procedures (including critical care time) Labs Review   Imaging Review  I have personally reviewed and evaluated these images and lab results as part of my medical decision-making.   EKG Interpretation None      MDM  MDM Reviewed: previous chart, nursing note and vitals Reviewed previous: labs and ECG Interpretation: labs, ECG and x-ray Total time providing critical care: 30-74 minutes. This excludes time spent performing separately reportable procedures and services. Consults: admitting MD     CRITICAL CARE Performed by: Alfonzo Feller Total critical care time: 35 Critical care time was exclusive of separately billable procedures and treating other patients. Critical care was necessary to treat or prevent imminent or life-threatening deterioration. Critical care was time spent personally by me on the following activities: development of treatment plan with patient and/or surrogate as well as nursing, discussions with consultants, evaluation of patient's response to treatment, examination of patient, obtaining history from patient or surrogate, ordering and performing treatments and interventions, ordering and review of laboratory studies, ordering and review of radiographic studies, pulse oximetry and re-evaluation of patient's condition.   ED ECG REPORT   Date: 07/13/2015  Rate: 113  Rhythm:  atrial fibrillation  QRS Axis: normal  Intervals: normal  ST/T Wave abnormalities: nonspecific ST/T changes  Conduction Disutrbances:nonspecific intraventricular conduction delay  Narrative Interpretation:   Old EKG Reviewed: changes noted; new afib compared to previous EKG dated 11/28/2012.   Results for orders placed or performed during the hospital encounter of 83/38/25  Basic metabolic panel  Result Value Ref Range   Sodium 137 135 - 145 mmol/L   Potassium 2.9 (L) 3.5 - 5.1 mmol/L   Chloride 100 (L) 101 - 111 mmol/L   CO2 27 22 - 32 mmol/L   Glucose, Bld 143 (H) 65 - 99 mg/dL   BUN 14 6 - 20 mg/dL   Creatinine, Ser 1.29 (H) 0.44 - 1.00 mg/dL   Calcium 8.8 (L) 8.9 - 10.3 mg/dL   GFR calc non Af Amer 36 (L) >60 mL/min   GFR calc Af Amer 42 (L) >60 mL/min   Anion gap 10 5 - 15  Brain natriuretic peptide  Result Value Ref Range   B Natriuretic Peptide 669.0 (H) 0.0 - 100.0 pg/mL  Troponin I  Result Value Ref Range   Troponin I 0.04 (H) <0.031 ng/mL  CBC with Differential  Result Value Ref Range   WBC 7.4 4.0 - 10.5 K/uL   RBC 3.77 (L) 3.87 - 5.11 MIL/uL   Hemoglobin 12.1 12.0 - 15.0 g/dL   HCT 36.1 36.0 - 46.0 %   MCV 95.8 78.0 - 100.0 fL   MCH 32.1 26.0 - 34.0 pg   MCHC 33.5 30.0 - 36.0 g/dL   RDW 13.4 11.5 - 15.5 %   Platelets 204 150 - 400 K/uL   Neutrophils Relative % 67 %   Neutro Abs 5.0 1.7 - 7.7 K/uL   Lymphocytes Relative 9 %   Lymphs Abs 0.7 0.7 - 4.0 K/uL   Monocytes Relative 23 %   Monocytes Absolute 1.7 (H) 0.1 - 1.0 K/uL   Eosinophils Relative 1 %   Eosinophils Absolute 0.1 0.0 - 0.7 K/uL   Basophils Relative 0 %   Basophils Absolute 0.0 0.0 - 0.1 K/uL   Dg Chest Port 1 View 07/13/2015  CLINICAL DATA:  Acute chest pain and shortness of breath. EXAM: PORTABLE CHEST 1 VIEW COMPARISON:  July 22, 2013. FINDINGS: Stable cardiomediastinal silhouette. No pneumothorax or pleural effusion  is noted. Both lungs are clear. The visualized skeletal structures are  unremarkable. IMPRESSION: No acute cardiopulmonary abnormality seen. Electronically Signed   By: Marijo Conception, M.D.   On: 07/13/2015 17:18    1820:  During my exam, monitor afib/RVR with rates into 140's. IV cardizem bolus and gtt started with HR decreasing into 90-100's. Pt continued to c/o chest discomfort despite decreasing HR, ASA and SL ntg given with improvement. Potassium repleted PO. BUN/Cr mildly elevated; judicious IVF given. BNP elevated, but no overt CHF on CXR; will hold lasix for now. Dx and testing d/w pt.  Questions answered.  Verb understanding, agreeable to admit.  T/C to [facility]. Anastasio Champion, case discussed, including:  HPI, pertinent PM/SHx, VS/PE, dx testing, ED course and treatment:  Agreeable to admit, requests to write temporary orders, obtain stepdown bed to team APAdmits.   Francine Graven, DO 07/15/15 2359

## 2015-07-13 NOTE — ED Notes (Signed)
Assumed care of patient from [place], [place]. Pt assisted back to bed from bedside commode, urinated 300 ml, yellow clear urine. Pt continues to be in A fib, on Cardizem. Denies pain. No distress. Daughter at side. Will monitor. VSS.

## 2015-07-14 ENCOUNTER — Inpatient Hospital Stay (HOSPITAL_COMMUNITY): Payer: Medicare Other

## 2015-07-14 DIAGNOSIS — I248 Other forms of acute ischemic heart disease: Secondary | ICD-10-CM

## 2015-07-14 DIAGNOSIS — I4891 Unspecified atrial fibrillation: Principal | ICD-10-CM

## 2015-07-14 DIAGNOSIS — E876 Hypokalemia: Secondary | ICD-10-CM

## 2015-07-14 DIAGNOSIS — I10 Essential (primary) hypertension: Secondary | ICD-10-CM

## 2015-07-14 DIAGNOSIS — J438 Other emphysema: Secondary | ICD-10-CM

## 2015-07-14 LAB — COMPREHENSIVE METABOLIC PANEL
ALBUMIN: 3.1 g/dL — AB (ref 3.5–5.0)
ALT: 16 U/L (ref 14–54)
AST: 25 U/L (ref 15–41)
Alkaline Phosphatase: 93 U/L (ref 38–126)
Anion gap: 8 (ref 5–15)
BUN: 13 mg/dL (ref 6–20)
CHLORIDE: 103 mmol/L (ref 101–111)
CO2: 27 mmol/L (ref 22–32)
Calcium: 8.4 mg/dL — ABNORMAL LOW (ref 8.9–10.3)
Creatinine, Ser: 1.25 mg/dL — ABNORMAL HIGH (ref 0.44–1.00)
GFR calc Af Amer: 44 mL/min — ABNORMAL LOW (ref >60)
GFR calc non Af Amer: 38 mL/min — ABNORMAL LOW (ref >60)
GLUCOSE: 149 mg/dL — AB (ref 65–99)
POTASSIUM: 3.5 mmol/L (ref 3.5–5.1)
Sodium: 138 mmol/L (ref 135–145)
Total Bilirubin: 0.8 mg/dL (ref 0.3–1.2)
Total Protein: 6.7 g/dL (ref 6.5–8.1)

## 2015-07-14 LAB — CBC
HCT: 33.2 % — ABNORMAL LOW (ref 36.0–46.0)
Hemoglobin: 11.2 g/dL — ABNORMAL LOW (ref 12.0–15.0)
MCH: 32.1 pg (ref 26.0–34.0)
MCHC: 33.7 g/dL (ref 30.0–36.0)
MCV: 95.1 fL (ref 78.0–100.0)
PLATELETS: 185 10*3/uL (ref 150–400)
RBC: 3.49 MIL/uL — ABNORMAL LOW (ref 3.87–5.11)
RDW: 13.4 % (ref 11.5–15.5)
WBC: 8.5 10*3/uL (ref 4.0–10.5)

## 2015-07-14 LAB — MAGNESIUM: MAGNESIUM: 1.9 mg/dL (ref 1.7–2.4)

## 2015-07-14 LAB — TROPONIN I
TROPONIN I: 0.05 ng/mL — AB (ref <0.031)
TROPONIN I: 0.04 ng/mL — AB (ref <0.031)

## 2015-07-14 LAB — MRSA PCR SCREENING: MRSA BY PCR: NEGATIVE

## 2015-07-14 MED ORDER — DILTIAZEM HCL 60 MG PO TABS
60.0000 mg | ORAL_TABLET | Freq: Three times a day (TID) | ORAL | Status: DC
Start: 2015-07-14 — End: 2015-07-15
  Administered 2015-07-14 – 2015-07-15 (×5): 60 mg via ORAL
  Filled 2015-07-14 (×5): qty 1

## 2015-07-14 MED ORDER — APIXABAN 5 MG PO TABS
5.0000 mg | ORAL_TABLET | Freq: Two times a day (BID) | ORAL | Status: DC
  Administered 2015-07-14 – 2015-07-15 (×3): 5 mg via ORAL
  Filled 2015-07-14 (×3): qty 1

## 2015-07-14 MED ORDER — FUROSEMIDE 40 MG PO TABS
40.0000 mg | ORAL_TABLET | Freq: Every day | ORAL | Status: DC
  Administered 2015-07-15: 40 mg via ORAL
  Filled 2015-07-14: qty 1

## 2015-07-14 NOTE — Progress Notes (Signed)
[facility] PROGRESS NOTE  [name] Walker MPN:[id number] DOB: May 26, 1925 DOA: 07/13/2015 PCP: Claretta Fraise, MD  Assessment/Plan: A Fib with RVR -Is hypotensive on cardizem drip; rate is currently controlled.. -Will transition to PO cardizem. -Is already on Eliquis for DVT, will continue. -ECHO pending to look for structural abnormalities.  Elevated troponin -Likely represents demand ischemia in the setting of a fib with RVR.  HTN -Is hypotensive currently. -Will transition off cardizem drip.  Hypokalemia -Repleted. -Check Mg level and replete as necessary.  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 -Currently receiving radiation.  COPD -Stable, no exacerbated.  H/o DVT -Continue eliquis.    Code Status: Full Code Family Communication: Patient only  Disposition Plan: May transition to tele this afternoon once off cardizem drip.   Consultants:  Cardiology   Antibiotics:  None   Subjective: Less SOB this am, does not feel like heart is racing anymore. No CP.  Objective: Filed Vitals:   07/14/15 0500 07/14/15 0600 07/14/15 0700 07/14/15 0800  BP: 96/54 103/55 98/49 97/60   Pulse: 97 82 67 151  Temp: 98.2 F (36.8 C)     TempSrc: Oral     Resp: 23 22 21 22   Height:      Weight: 80.9 kg (178 lb 5.6 oz)     SpO2: 96% 95% 96% 90%    Intake/Output Summary (Last 24 hours) at 07/14/15 0913 Last data filed at 07/14/15 0400  Gross per 24 hour  Intake    385 ml  Output    300 ml  Net     85 ml   Filed Weights   07/13/15 1632 07/13/15 2036 07/14/15 0500  Weight: 79.833 kg (176 lb) 80.7 kg (177 lb 14.6 oz) 80.9 kg (178 lb 5.6 oz)    Exam:   General:  AA Ox3  Cardiovascular: Irregular rhythm, slight SEM.  Respiratory: CTA B  Abdomen: S/NT/ND/+BS  Extremities: no C/C/E   Neurologic:  Grossly intact and non-focal  Data Reviewed: Basic Metabolic Panel:  Recent Labs Lab 07/13/15 1640 07/14/15 0200  NA 137 138  K  2.9* 3.5  CL 100* 103  CO2 27 27  GLUCOSE 143* 149*  BUN 14 13  CREATININE 1.29* 1.25*  CALCIUM 8.8* 8.4*   Liver Function Tests:  Recent Labs Lab 07/14/15 0200  AST 25  ALT 16  ALKPHOS 93  BILITOT 0.8  PROT 6.7  ALBUMIN 3.1*   No results for input(s): LIPASE, AMYLASE in the last 168 hours. No results for input(s): AMMONIA in the last 168 hours. CBC:  Recent Labs Lab 07/13/15 1640 07/14/15 0200  WBC 7.4 8.5  NEUTROABS 5.0  --   HGB 12.1 11.2*  HCT 36.1 33.2*  MCV 95.8 95.1  PLT 204 185   Cardiac Enzymes:  Recent Labs Lab 07/13/15 1640 07/13/15 1950 07/14/15 0200 07/14/15 0743  TROPONINI 0.04* 0.04* 0.05* 0.04*   BNP (last 3 results)  Recent Labs  07/13/15 1640  BNP 669.0*    ProBNP (last 3 results) No results for input(s): PROBNP in the last 8760 hours.  CBG: No results for input(s): GLUCAP in the last 168 hours.  Recent Results (from the past 240 hour(s))  MRSA PCR Screening     Status: None   Collection Time: 07/13/15  8:40 PM  Result Value Ref Range Status   MRSA by PCR NEGATIVE NEGATIVE Final    Comment:        The GeneXpert MRSA Assay (FDA approved for NASAL  specimens only), is one component of a comprehensive MRSA colonization surveillance program. It is not intended to diagnose MRSA infection nor to guide or monitor treatment for MRSA infections.      Studies: Dg Chest Port 1 View  07/13/2015  CLINICAL DATA:  Acute chest pain and shortness of breath. EXAM: PORTABLE CHEST 1 VIEW COMPARISON:  July 22, 2013. FINDINGS: Stable cardiomediastinal silhouette. No pneumothorax or pleural effusion is noted. Both lungs are clear. The visualized skeletal structures are unremarkable. IMPRESSION: No acute cardiopulmonary abnormality seen. Electronically Signed   By: Marijo Conception, M.D.   On: 07/13/2015 17:18    Scheduled Meds: . amitriptyline  75 mg Oral QHS  . apixaban  2.5 mg Oral BID  . calcium-vitamin D  2 tablet Oral Daily  .  cholecalciferol  2,000 Units Oral Daily  . citalopram  20 mg Oral Daily  . diltiazem  60 mg Oral 3 times per day  . donepezil  10 mg Oral QHS  . [START ON 07/15/2015] furosemide  40 mg Oral Daily  . gabapentin  300 mg Oral BID  . loratadine  10 mg Oral QHS  . pantoprazole  40 mg Oral Daily  . potassium chloride  20 mEq Oral Daily  . rOPINIRole  2 mg Oral QHS  . sodium chloride  3 mL Intravenous Q12H  . triamcinolone cream   Topical BID   Continuous Infusions:   Active Problems:   Essential hypertension   COPD (chronic obstructive pulmonary disease) ([facility])   Atrial fibrillation with rapid ventricular response ([place])    Time spent: 25 minutes. Greater than 50% of this time was spent in direct contact with the patient coordinating care.    Lelon Frohlich  [facility] Pager [phone]  If 7PM-7AM, please contact night-coverage at [email or web address], password [facility] 07/14/2015, 9:13 AM  LOS: 1 day

## 2015-07-14 NOTE — Progress Notes (Signed)
Report called Lucina Mellow. Patient transported to [id number] via wheelchair.

## 2015-07-14 NOTE — Consult Note (Signed)
CARDIOLOGY CONSULT NOTE   Patient ID: [name] Walker MRN: [id number] DOB/AGE: 79/28/1926 79 y.o.  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 She was found to be in atrial fib with RVR on presentation to ER. She has no prior cardiac history.   She states that two nights ago, while at rest, she began sudden onset of dyspnea. She felt herself breathing harder. The following day she saw oncology for radiation treatment and expressed her symptoms to them. She was sent to see her PCP, Dr. Sabra Heck, who noticed that she had irregular rapid HR. She was sent to ER.   On arrival to ER she was found to be hypokalemic K+ 2.9. Pro-BNP 669.Creainine 1.29, She was not found to be anemic, platelets 325. EKG demonstrated atrial fib with rate of 125 bpm. BP 130/103, afebrile. No evidence of pneumonia or CHF on CXR. She was treated with potassium supplement, NTG, and started on diltiazem gtt. Labs this am reveal improved potassium level of 3.5, creatinine of 1.25. Troponin 0.04, 0.04, and 0.05 respectively. EKG rate of 81 bpm. She currently is on diltiazem at 10 mg/hr. Feeling and breathing better.    Allergies  Allergen Reactions  . Iohexol      Desc: THROAT SWELLING-NOTED IN CHART AFTER IVC PLACEMENT-ARS 12-02-05   . Latex Other (See Comments)    unknown  . Penicillins Swelling  . Shellfish Allergy Other (See Comments)    Reaction unknown    Medications Scheduled Medications: . sodium chloride   Intravenous STAT  .  amitriptyline  75 mg Oral QHS  . amLODipine  2.5 mg Oral Daily  . apixaban  2.5 mg Oral BID  . calcium-vitamin D  2 tablet Oral Daily  . cholecalciferol  2,000 Units Oral Daily  . citalopram  20 mg Oral Daily  . donepezil  10 mg Oral QHS  . furosemide  40 mg Oral BID  . gabapentin  300 mg Oral BID  . loratadine  10 mg Oral QHS  . pantoprazole  40 mg Oral Daily  . potassium chloride  20 mEq Oral Daily  . rOPINIRole  2 mg Oral QHS  . sodium chloride  3 mL Intravenous Q12H  . triamcinolone cream   Topical BID    Infusions: . diltiazem (CARDIZEM) infusion 10 mg/hr (07/14/15 0400)    PRN Medications: ondansetron **OR** ondansetron (ZOFRAN) IV, oxyCODONE-acetaminophen, traMADol   Past Medical History  Diagnosis Date  . DVT (deep venous thrombosis) ([place])   . Hypertension   . COPD (chronic obstructive pulmonary disease) ([place])   . GERD (gastroesophageal reflux disease)   . Osteoarthritis   . Ischemic colitis ([place])   . Depression   . Gastric erosion   . Insomnia   . Cataract   . Parotid mass 2016    Past Surgical History  Procedure Laterality Date  . Total hip arthroplasty      bilateral  . Knee arthroplasty      bilateral  . Esophagogastroduodenoscopy  10/17/2007    erosion   . Colonoscopy    . Back surgery    . Abdominal hysterectomy    .  Cholecystectomy    . Cataracts    . Parotidectomy Left 05/17/2015    Procedure: PAROTIDECTOMY- LEFT TOTAL;  Surgeon: Leta Baptist, MD;  Location: [place] [facility];  Service: ENT;  Laterality: Left;    Family History  Problem Relation Age of Onset  . Rheum arthritis Mother   . Ulcers Mother     on foot  . Ulcers Father   . Cancer Sister     uterine    Social History [name] Walker reports that she has never smoked. She has never used smokeless tobacco. [name] Walker reports that she does not drink alcohol.  Review of Systems Complete review of systems are found to be negative unless outlined in H&P above.  Physical  Examination Blood pressure 96/54, pulse 97, temperature 98.2 F (36.8 C), temperature source Oral, resp. rate 23, height 4\' 10"  (1.473 m), weight 178 lb 5.6 oz (80.9 kg), SpO2 96 %.  Intake/Output Summary (Last 24 hours) at 07/14/15 0809 Last data filed at 07/14/15 0400  Gross per 24 hour  Intake    385 ml  Output    300 ml  Net     85 ml    Telemetry: Atrial fib rates in the  80's on diltiazem gtt at 10 mg/hour  GEN:No acute distress  HEENT: Conjunctiva and lids normal, oropharynx clear with moist mucosa.Edentulous.  Neck: Supple, no elevated JVP or carotid bruits, no thyromegaly. Lungs: Clear to auscultation, some crackles in the bases, wearing O2, Cardiac: Iregular rate and rhythm, no S3 soft systolic murmur, no pericardial rub. Abdomen: Soft, nontender, no hepatomegaly, bowel sounds present, no guarding or rebound. Extremities: No pitting edema, distal pulses 2+. Skin: Warm and dry. Musculoskeletal: No kyphosis. Neuropsychiatric: Alert and oriented x3, affect grossly appropriate. Hard of hearing.   Prior Cardiac Testing/Procedures 1. Echocardiogram 10/16/2007 Overall left ventricular systolic function was mildly decreased.    Left ventricular ejection fraction was estimated to be 50 %.    severe hypokinesis to akinesis of the inferoposterior wall. - There was mild fibrocalcific change of the aortic root. - There was mild mitral annular calcification. There was mild    mitral valvular regurgitation. - The left atrium was mildly dilated. - The estimated peak right ventricular systolic pressure was mildly    increased. - The inferior vena cava was dilated. Lab Results  Basic Metabolic Panel:  Recent Labs Lab 07/13/15 1640 07/14/15 0200  NA 137 138  K 2.9* 3.5  CL 100* 103  CO2 27 27  GLUCOSE 143* 149*  BUN 14 13  CREATININE 1.29* 1.25*  CALCIUM 8.8* 8.4*    Liver Function Tests:  Recent Labs Lab 07/14/15 0200  AST 25  ALT 16    ALKPHOS 93  BILITOT 0.8  PROT 6.7  ALBUMIN 3.1*    CBC:  Recent Labs Lab 07/13/15 1640 07/14/15 0200  WBC 7.4 8.5  NEUTROABS 5.0  --   HGB 12.1 11.2*  HCT 36.1 33.2*  MCV 95.8 95.1  PLT 204 185    Cardiac Enzymes:  Recent Labs Lab 07/13/15 1640 07/13/15 1950 07/14/15 0200  TROPONINI 0.04* 0.04* 0.05*    Radiology: Dg Chest Port 1 View  07/13/2015  CLINICAL DATA:  Acute chest pain and shortness of breath. EXAM: PORTABLE CHEST 1 VIEW COMPARISON:  July 22, 2013. FINDINGS: Stable cardiomediastinal silhouette. No pneumothorax or pleural effusion is noted. Both lungs are clear. The visualized skeletal structures are unremarkable. IMPRESSION: No acute cardiopulmonary abnormality seen. Electronically Signed   By: Jeneen Rinks  Murlean Caller, M.D.   On: 07/13/2015 17:18     ECG: Atrial fibrillation rate of 125 bpm.   Impression and Recommendations  1.Acute onset of Atrial fib: Uncertain duration, but symptoms of dyspnea began approximately 48-72 hours ago at rest. She is now on diltiazem gtt. CHADS VASC Score of 4. Has been on Eliquis for hx of DVT. Will transition to po diltiazem 60 mg Q 8 hours for now as she is a little hypotensive. Will have echo completed for LV fx. No ASA as she is on  NOAC.   2. Hypertension: Mild hypotension. Will d/c amlodipine as she is on diltiazem/ Reduce lasix to 40 mg daily.   3. Mildly elevated troponin: Likely from demand ischemia in the setting of elevated HR.   4. Hypokalemia: Repleted. Will check magnesium as she is on PPI.   5. Parotid Cancer: Receiving radiation, but states she got her last treatment on Wed.   Signed: Phill Myron. Lawrence NP [place]  07/14/2015, 8:09 AM Co-Sign MD  The patient was seen and examined, and I agree with the assessment and plan as documented above, with modifications as noted below. Pt admitted with rapid atrial fibrillation. HR currently controlled on oral diltiazem. Will increase apixaban to 5 mg bid which the  recommended dose given her weight and creatinine for adequate thromboembolic risk reduction. Elevated troponins likely indicative of demand ischemia. Currently has chest pains made worse by deep inspiration. Chest xray unremarkable. K repleted. Will monitor BP and follow up echocardiogram.

## 2015-07-14 NOTE — Progress Notes (Signed)
*  PRELIMINARY RESULTS* Echocardiogram 2D Echocardiogram has been performed.  Leavy Cella 07/14/2015, 4:26 PM

## 2015-07-14 NOTE — Care Management Note (Signed)
Case Management Note  Patient Details  Name: [name] Walker MRN: [id number] Date of Birth: 1924-10-20  Subjective/Objective:                  Pt admitted with afib with RVR. Pt is from home, lives alone and has strong family support. Pt has no HH services, DME or med needs prior to admission. Pt has no home O2 prior to admission but is using supplemental O2 now. Pt says she is not interested in any [facility] services, as she does not want anyone coming into her home except family.  Action/Plan: Pt plans to return home with self care. Pt will need home O2 assessment prior to DC. Will cont to follow.   Expected Discharge Date:  07/16/15               Expected Discharge Plan:  Home/Self Care  In-House Referral:  NA  Discharge planning Services  CM Consult  Post Acute Care Choice:    Choice offered to:     DME Arranged:    DME Agency:     HH Arranged:    HH Agency:     Status of Service:  In process, will continue to follow  Medicare Important Message Given:    Date Medicare IM Given:    Medicare IM give by:    Date Additional Medicare IM Given:    Additional Medicare Important Message give by:     If discussed at [place] of Stay Meetings, dates discussed:    Additional Comments:  Sherald Barge, RN 07/14/2015, 2:04 PM

## 2015-07-15 DIAGNOSIS — J430 Unilateral pulmonary emphysema [MacLeod's syndrome]: Secondary | ICD-10-CM

## 2015-07-15 LAB — CBC
HCT: 33.4 % — ABNORMAL LOW (ref 36.0–46.0)
Hemoglobin: 11 g/dL — ABNORMAL LOW (ref 12.0–15.0)
MCH: 31.8 pg (ref 26.0–34.0)
MCHC: 32.9 g/dL (ref 30.0–36.0)
MCV: 96.5 fL (ref 78.0–100.0)
PLATELETS: 207 10*3/uL (ref 150–400)
RBC: 3.46 MIL/uL — ABNORMAL LOW (ref 3.87–5.11)
RDW: 13.6 % (ref 11.5–15.5)
WBC: 8.2 10*3/uL (ref 4.0–10.5)

## 2015-07-15 LAB — BASIC METABOLIC PANEL
Anion gap: 8 (ref 5–15)
BUN: 14 mg/dL (ref 6–20)
CHLORIDE: 102 mmol/L (ref 101–111)
CO2: 26 mmol/L (ref 22–32)
CREATININE: 1.01 mg/dL — AB (ref 0.44–1.00)
Calcium: 8.6 mg/dL — ABNORMAL LOW (ref 8.9–10.3)
GFR calc Af Amer: 56 mL/min — ABNORMAL LOW (ref >60)
GFR calc non Af Amer: 49 mL/min — ABNORMAL LOW (ref >60)
Glucose, Bld: 138 mg/dL — ABNORMAL HIGH (ref 65–99)
Potassium: 3.6 mmol/L (ref 3.5–5.1)
SODIUM: 136 mmol/L (ref 135–145)

## 2015-07-15 MED ORDER — FUROSEMIDE 40 MG PO TABS: 40.0000 mg | ORAL_TABLET | Freq: Every day | ORAL | Status: DC

## 2015-07-15 MED ORDER — APIXABAN 5 MG PO TABS: 5.0000 mg | ORAL_TABLET | Freq: Two times a day (BID) | ORAL | Status: DC

## 2015-07-15 MED ORDER — DILTIAZEM HCL ER COATED BEADS 240 MG PO TB24: 240.0000 mg | ORAL_TABLET | Freq: Every day | ORAL | Status: DC

## 2015-07-15 NOTE — Care Management Note (Signed)
Case Management Note  Patient Details  Name: ERIANNA JOLLY MRN: [id number] Date of Birth: 02-24-1925  Expected Discharge Date:  07/16/15               Expected Discharge Plan:  Home/Self Care  In-House Referral:  NA  Discharge planning Services  CM Consult  Post Acute Care Choice:    Choice offered to:     DME Arranged:    DME Agency:     HH Arranged:    [place] Agency:     Status of Service:  Completed, signed off  Medicare Important Message Given:  N/A - LOS <3 / Initial given by admissions Date Medicare IM Given:    Medicare IM give by:    Date Additional Medicare IM Given:    Additional Medicare Important Message give by:     If discussed at [place] of Stay Meetings, dates discussed:    Additional Comments: Pt discharging home today with self care. Pt ambulating in hallway with NT. Pt's O2 sats remain above 90 ambulating on RA. Pt does not meet requirements for home O2. Pt using VS machine for support with ambulation, pt says she will use her walker when she gets home. No further CM needs noted a this time.   Sherald Barge, RN 07/15/2015, 10:08 AM

## 2015-07-15 NOTE — Progress Notes (Signed)
Pt ambulated in hallway approximately 100 feet.  Tolerated well.  O2 saturations ranged between 93-97 % on room air while ambulating.

## 2015-07-15 NOTE — Care Management Important Message (Signed)
Important Message  Patient Details  Name: [name] Walker MRN: [id number] Date of Birth: December 03, 1924   Medicare Important Message Given:  N/A - LOS <3 / Initial given by admissions    Sherald Barge, RN 07/15/2015, 10:07 AM

## 2015-07-15 NOTE — Progress Notes (Signed)
Pt discharged home today per Dr. Hernandez. Pt's IV site D/C'd and WDL. Pt's VSS. Pt provided with home medication list, discharge instructions and prescriptions. Verbalized understanding. Pt left floor via WC in stable condition accompanied by NT. 

## 2015-07-15 NOTE — Progress Notes (Signed)
Patient ID: [name] Walker, female   DOB: [birth year], 81 y.o.   MRN: [id number]    Subjective:  I hurt all over   Objective:  Filed Vitals:   07/14/15 1807 07/14/15 1819 07/14/15 2111 07/15/15 0526  BP: 118/63  110/88 113/52  Pulse:   109 87  Temp:   98.4 F (36.9 C) 98.3 F (36.8 C)  TempSrc:   Oral Oral  Resp: 18  18 20   Height:  4\' 10"  (1.473 m)    Weight:  80.7 kg (177 lb 14.6 oz)    SpO2: 94%  97% 94%    Intake/Output from previous day:  Intake/Output Summary (Last 24 hours) at 07/15/15 9983 Last data filed at 07/15/15 0700  Gross per 24 hour  Intake    730 ml  Output   1000 ml  Net   -270 ml    Physical Exam: Affect appropriate Very elderly white female  HEENT: normal Neck supple with no adenopathy JVP normal no bruits no thyromegaly Lungs clear with no wheezing and good diaphragmatic motion Heart:  S1/S2 SEM  murmur, no rub, gallop or click PMI normal Abdomen: benighn, BS positve, no tenderness, no AAA no bruit.  No HSM or HJR Distal pulses intact with no bruits No edema Neuro non-focal Skin warm and dry No muscular weakness   Lab Results: Basic Metabolic Panel:  Recent Labs  07/14/15 0157 07/14/15 0200 07/15/15 0530  NA  --  138 136  K  --  3.5 3.6  CL  --  103 102  CO2  --  27 26  GLUCOSE  --  149* 138*  BUN  --  13 14  CREATININE  --  1.25* 1.01*  CALCIUM  --  8.4* 8.6*  MG 1.9  --   --    Liver Function Tests:  Recent Labs  07/14/15 0200  AST 25  ALT 16  ALKPHOS 93  BILITOT 0.8  PROT 6.7  ALBUMIN 3.1*   CBC:  Recent Labs  07/13/15 1640 07/14/15 0200 07/15/15 0530  WBC 7.4 8.5 8.2  NEUTROABS 5.0  --   --   HGB 12.1 11.2* 11.0*  HCT 36.1 33.2* 33.4*  MCV 95.8 95.1 96.5  PLT 204 185 207   Cardiac Enzymes:  Recent Labs  07/13/15 1950 07/14/15 0200 07/14/15 0743  TROPONINI 0.04* 0.05* 0.04*    Imaging: Dg Chest Port 1 View  07/13/2015  CLINICAL DATA:  Acute chest pain and shortness of breath. EXAM:  PORTABLE CHEST 1 VIEW COMPARISON:  July 22, 2013. FINDINGS: Stable cardiomediastinal silhouette. No pneumothorax or pleural effusion is noted. Both lungs are clear. The visualized skeletal structures are unremarkable. IMPRESSION: No acute cardiopulmonary abnormality seen. Electronically Signed   By: Marijo Conception, M.D.   On: 07/13/2015 17:18    Cardiac Studies:  ECG:  afib rate 113 nonspecific ST changes    Telemetry:  afib rates 70-80  Echo:  07/14/15 Study Conclusions  - Left ventricle: The cavity size was normal. There was moderate concentric hypertrophy. Systolic function was normal. The estimated ejection fraction was in the range of 55% to 60%. Images were inadequate for LV wall motion assessment. However, no gross regional variation seen on apical 4-chamber views. The study was not technically sufficient to allow evaluation of LV diastolic dysfunction due to atrial fibrillation. - Aortic valve: Moderately calcified annulus. Mildly thickened, mildly calcified leaflets. Morphologically, there appears to be at least mild stenosis. Full hemodynamic assessment was not performed. -  Mitral valve: Moderately calcified annulus. There was mild regurgitation. - Left atrium: The atrium was moderately to severely dilated. - Tricuspid valve: There was mild regurgitation.  Medications:   . amitriptyline  75 mg Oral QHS  . apixaban  5 mg Oral BID  . calcium-vitamin D  2 tablet Oral Daily  . cholecalciferol  2,000 Units Oral Daily  . citalopram  20 mg Oral Daily  . diltiazem  60 mg Oral 3 times per day  . donepezil  10 mg Oral QHS  . furosemide  40 mg Oral Daily  . gabapentin  300 mg Oral BID  . loratadine  10 mg Oral QHS  . pantoprazole  40 mg Oral Daily  . potassium chloride  20 mEq Oral Daily  . rOPINIRole  2 mg Oral QHS  . sodium chloride  3 mL Intravenous Q12H  . triamcinolone cream   Topical BID       Assessment/Plan:  Afib:  eliquis increased to 5  bid yesterday CHADVASC 4.  Rate control fine on cardizem.  EF normal by echo No cardiac symptoms Would not pursue [facility] at this time and adopt rate control and anticoagulation goal for her TSH/T4 normal in August   Kimon Loewen 07/15/2015, 8:37 AM

## 2015-07-15 NOTE — Discharge Summary (Signed)
Physician Discharge Summary  [name] Walker FFM:[id number] DOB: Jan 02, 1925 DOA: 07/13/2015  PCP: Claretta Fraise, MD  Admit date: 07/13/2015 Discharge date: 07/15/2015  Time spent: 45 minutes  Recommendations for Outpatient Follow-up:  -Will be discharged home today. -Advised to follow up with PCP in 2 weeks. -[place] services will be arranged; she is refusing SNF placement.   Discharge Diagnoses:  Active Problems:   Essential hypertension   COPD (chronic obstructive pulmonary disease) (HCC)   Atrial fibrillation with rapid ventricular response [facility])   Discharge Condition: Stable and improved  Filed Weights   07/13/15 2036 07/14/15 0500 07/14/15 1819  Weight: 80.7 kg (177 lb 14.6 oz) 80.9 kg (178 lb 5.6 oz) 80.7 kg (177 lb 14.6 oz)    History of present illness:  As per Dr. Anastasio Champion 10/19: This is an 81 year old lady who is on anticoagulation therapy for DVT and now presents with a 2 day history of chest pain associated with dyspnea. She described the chest pain is aching with radiation up to her neck. She was seen by her primary care physician today who noticed that her heart rate was rapid and referred the patient to the emergency room. The patient denies any nausea, vomiting or abdominal pain. There is no cough or fever. She has not had any syncopal episodes. Evaluation in the emergency room showed her to be in atrial fibrillation with rapid ventricular response. She is now being admitted for further management.  Hospital Course:   A Fib with RVR -Required a cardizem drip which has been transitioned to PO. -Seen my cardiology. -Rate controlled at present. -Continue eliquis at increased dose of 5 mg BID.  Elevated troponin -Likely represents demand ischemia in the setting of a fib with RVR. -Troponins have been flat at 0.04. -ECHO: Left ventricle: The cavity size was normal. There was moderate concentric hypertrophy. Systolic function was normal. The estimated  ejection fraction was in the range of 55% to 60%. Images were inadequate for LV wall motion assessment. However, no gross regional variation seen on apical 4-chamber views. The study was not technically sufficient to allow evaluation of LV diastolic dysfunction due to atrial fibrillation.   HTN -DC norvasc in favor of cardizem.  Hypokalemia -Repleted.  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 -Currently receiving radiation.  COPD -Stable, no exacerbated.  H/o DVT -Continue eliquis.    Procedures:  None   Consultations:  Cardiology  Discharge Instructions  Discharge Instructions    Diet - low sodium heart healthy    Complete by:  As directed      Increase activity slowly    Complete by:  As directed             Medication List    STOP taking these medications        amLODipine 2.5 MG tablet  Commonly known as:  NORVASC     clindamycin 300 MG capsule  Commonly known as:  CLEOCIN      TAKE these medications        amitriptyline 75 MG tablet  Commonly known as:  ELAVIL  TAKE 1 TABLETS AT BEDTIME     apixaban 5 MG Tabs tablet  Commonly known as:  ELIQUIS  Take 1 tablet (5 mg total) by mouth 2 (two) times daily.     calcium-vitamin D 250-100 MG-UNIT tablet  Take 2 tablets by mouth daily.     citalopram 20 MG tablet  Commonly known as:  CELEXA  Take 1 tablet  by mouth  daily     diltiazem 240 MG 24 hr tablet  Commonly known as:  CARDIZEM LA  Take 1 tablet (240 mg total) by mouth daily.     donepezil 10 MG tablet  Commonly known as:  ARICEPT  Take 1 tablet (10 mg total) by mouth at bedtime.     furosemide 40 MG tablet  Commonly known as:  LASIX  Take 1 tablet (40 mg total) by mouth daily.     gabapentin 300 MG capsule  Commonly known as:  NEURONTIN  TAKE 1 CAPSULE TWICE DAILY.     levocetirizine 5 MG tablet  Commonly known as:  XYZAL  Take 1 tablet (5 mg total) by mouth at bedtime.     omeprazole 20 MG capsule    Commonly known as:  PRILOSEC  TAKE 2 CAPSULES DAILY AS NEEDED FOR REFLUX     oxyCODONE-acetaminophen 5-325 MG tablet  Commonly known as:  ROXICET  Take 1 tablet by mouth every 4 (four) hours as needed for severe pain.     potassium chloride 10 MEQ tablet  Commonly known as:  K-DUR,KLOR-CON  TAKE (2) TABLETS DAILY     rOPINIRole 1 MG tablet  Commonly known as:  REQUIP  TAKE 1 OR 2 TABLETS AT  BEDTIME     traMADol 50 MG tablet  Commonly known as:  ULTRAM  TAKE (1) TABLET DAILY AS NEEDED.     triamcinolone cream 0.1 %  Commonly known as:  KENALOG  Apply 1 application topically daily.     Vitamin D3 2000 UNITS Chew  Chew 1 tablet by mouth daily.       Allergies  Allergen Reactions  . Iohexol      Desc: THROAT SWELLING-NOTED IN CHART AFTER IVC PLACEMENT-ARS 12-02-05   . Latex Other (See Comments)    unknown  . Penicillins Swelling  . Shellfish Allergy Other (See Comments)    Reaction unknown       Follow-up Information    Follow up with STACKS,WARREN, MD. Schedule an appointment as soon as possible for a visit in 2 weeks.   Specialty:  Family Medicine   Contact information:   [place] [address] [phone]        The results of significant diagnostics from this hospitalization (including imaging, microbiology, ancillary and laboratory) are listed below for reference.    Significant Diagnostic Studies: Dg Chest Port 1 View  07/13/2015  CLINICAL DATA:  Acute chest pain and shortness of breath. EXAM: PORTABLE CHEST 1 VIEW COMPARISON:  July 22, 2013. FINDINGS: Stable cardiomediastinal silhouette. No pneumothorax or pleural effusion is noted. Both lungs are clear. The visualized skeletal structures are unremarkable. IMPRESSION: No acute cardiopulmonary abnormality seen. Electronically Signed   By: Marijo Conception, M.D.   On: 07/13/2015 17:18    Microbiology: Recent Results (from the past 240 hour(s))  MRSA PCR Screening     Status: None    Collection Time: 07/13/15  8:40 PM  Result Value Ref Range Status   MRSA by PCR NEGATIVE NEGATIVE Final    Comment:        The GeneXpert MRSA Assay (FDA approved for NASAL specimens only), is one component of a comprehensive MRSA colonization surveillance program. It is not intended to diagnose MRSA infection nor to guide or monitor treatment for MRSA infections.      Labs: Basic Metabolic Panel:  Recent Labs Lab 07/13/15 1640 07/14/15 0157 07/14/15 0200 07/15/15 0530  NA 137  --  138 136  K 2.9*  --  3.5 3.6  CL 100*  --  103 102  CO2 27  --  27 26  GLUCOSE 143*  --  149* 138*  BUN 14  --  13 14  CREATININE 1.29*  --  1.25* 1.01*  CALCIUM 8.8*  --  8.4* 8.6*  MG  --  1.9  --   --    Liver Function Tests:  Recent Labs Lab 07/14/15 0200  AST 25  ALT 16  ALKPHOS 93  BILITOT 0.8  PROT 6.7  ALBUMIN 3.1*   No results for input(s): LIPASE, AMYLASE in the last 168 hours. No results for input(s): AMMONIA in the last 168 hours. CBC:  Recent Labs Lab 07/13/15 1640 07/14/15 0200 07/15/15 0530  WBC 7.4 8.5 8.2  NEUTROABS 5.0  --   --   HGB 12.1 11.2* 11.0*  HCT 36.1 33.2* 33.4*  MCV 95.8 95.1 96.5  PLT 204 185 207   Cardiac Enzymes:  Recent Labs Lab 07/13/15 1640 07/13/15 1950 07/14/15 0200 07/14/15 0743  TROPONINI 0.04* 0.04* 0.05* 0.04*   BNP: BNP (last 3 results)  Recent Labs  07/13/15 1640  BNP 669.0*    ProBNP (last 3 results) No results for input(s): PROBNP in the last 8760 hours.  CBG: No results for input(s): GLUCAP in the last 168 hours.     SignedLelon Frohlich  [facility] Pager: [phone] 07/15/2015, 9:26 AM

## 2015-07-19 DIAGNOSIS — Z510 Encounter for antineoplastic radiation therapy: Secondary | ICD-10-CM | POA: Diagnosis not present

## 2015-07-19 DIAGNOSIS — C07 Malignant neoplasm of parotid gland: Secondary | ICD-10-CM | POA: Diagnosis not present

## 2015-07-21 ENCOUNTER — Other Ambulatory Visit: Payer: Self-pay | Admitting: Family Medicine

## 2015-07-22 ENCOUNTER — Encounter: Payer: Self-pay | Admitting: Family Medicine

## 2015-07-22 ENCOUNTER — Ambulatory Visit (INDEPENDENT_AMBULATORY_CARE_PROVIDER_SITE_OTHER): Payer: Medicare Other | Admitting: Family Medicine

## 2015-07-22 VITALS — BP 130/73 | HR 70 | Temp 97.0°F | Ht <= 58 in | Wt 181.0 lb

## 2015-07-22 DIAGNOSIS — I482 Chronic atrial fibrillation, unspecified: Secondary | ICD-10-CM | POA: Insufficient documentation

## 2015-07-22 DIAGNOSIS — Z9049 Acquired absence of other specified parts of digestive tract: Secondary | ICD-10-CM

## 2015-07-22 DIAGNOSIS — K219 Gastro-esophageal reflux disease without esophagitis: Secondary | ICD-10-CM

## 2015-07-22 DIAGNOSIS — I10 Essential (primary) hypertension: Secondary | ICD-10-CM

## 2015-07-22 MED ORDER — FUROSEMIDE 40 MG PO TABS: 80.0000 mg | ORAL_TABLET | Freq: Every day | ORAL | Status: DC

## 2015-07-22 NOTE — Progress Notes (Signed)
Subjective:  Patient ID: [name] Walker, female    DOB: Mar 27, 1925  Age: 81 y.o. MRN: [id number]  CC: Hospitalization Follow-up   HPI [name] Walker presents for Patient in for follow-up of atrial fibrillation. Patient denies any bouts of chest pain or palpitations since hospital discharge one week ago. Her primary diagnosis had been atrial fibrillation with rapid ventricular response. She had some ischemic change.. Additionally, patient is taking anticoagulants. Patient denies any recent excessive bleeding episodes including epistaxis, bleeding from the gums, genitalia, rectal bleeding or hematuria. Additionally there has been no excessive bruising.  She is swelling more. She says that her fluid pill was cut back and she would  like to have increased because of all the swelling in her legs. Additionally she is seeing a cancer specialist for the parotid cancer. It is weeping and she is having some discomfort at the mastoid area. However she is following with oncology closely. She just needs her dressing changed here today.   follow-up of hypertension. Patient has no history of headache chest pain or shortness of breath or recent cough. Patient also denies symptoms of TIA such as numbness weakness lateralizing. Patient denies side effects from medication. States taking it regularly.     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   She has past surgical history that includes Total hip arthroplasty; Knee Arthroplasty; Esophagogastroduodenoscopy (10/17/2007); Colonoscopy; Back surgery; Abdominal hysterectomy; Cholecystectomy; Cataracts; and Parotidectomy (Left,  05/17/2015).   Her family history includes Cancer in her sister; Rheum arthritis in her mother; Ulcers in her father and mother.She reports that she has never smoked. She has never used smokeless tobacco. She reports that she does not drink alcohol or use illicit drugs.  Outpatient Prescriptions Prior to Visit  Medication Sig Dispense Refill  . amitriptyline (ELAVIL) 75 MG tablet TAKE 1 TABLETS AT BEDTIME 30 tablet 2  . apixaban (ELIQUIS) 5 MG TABS tablet Take 1 tablet (5 mg total) by mouth 2 (two) times daily. 60 tablet 2  . calcium-vitamin D 250-100 MG-UNIT per tablet Take 2 tablets by mouth daily.    . Cholecalciferol (VITAMIN D3) 2000 UNITS CHEW Chew 1 tablet by mouth daily.    . citalopram (CELEXA) 20 MG tablet TAKE 1 TABLET DAILY 30 tablet 2  . diltiazem (CARDIZEM LA) 240 MG 24 hr tablet Take 1 tablet (240 mg total) by mouth daily. 30 tablet 2  . donepezil (ARICEPT) 10 MG tablet Take 1 tablet (10 mg total) by mouth at bedtime. 90 tablet 0  . gabapentin (NEURONTIN) 300 MG capsule TAKE 1 CAPSULE TWICE DAILY. 180 capsule 0  . levocetirizine (XYZAL) 5 MG tablet Take 1 tablet (5 mg total) by mouth at bedtime. 90 tablet 1  . omeprazole (PRILOSEC) 20 MG capsule TAKE 2 CAPSULES DAILY AS NEEDED FOR REFLUX (Patient taking differently: Take 40 mg by mouth daily as needed. NEEDED FOR REFLUX) 180 capsule 1  . oxyCODONE-acetaminophen (ROXICET) 5-325 MG per tablet Take 1 tablet by mouth every 4 (four) hours as needed for severe pain. 30 tablet 0  . potassium chloride (K-DUR,KLOR-CON) 10 MEQ tablet TAKE (2) TABLETS DAILY (Patient taking differently: Take 20 mEq by mouth daily. ) 180 tablet 1  . rOPINIRole (REQUIP) 1 MG tablet TAKE 1 OR 2 TABLETS AT  BEDTIME (Patient taking differently: Take 2 mg by mouth at bedtime. ) 180 tablet 0  . traMADol (ULTRAM) 50 MG tablet TAKE (1) TABLET DAILY AS NEEDED. (Patient taking differently: TAKE (1) TABLET DAILY AS NEEDED FOR PAIN) 30 tablet 0  . triamcinolone cream  (KENALOG) 0.1 % Apply 1 application topically daily. 30 g 1  . furosemide (LASIX) 40 MG tablet Take 1 tablet (40 mg total) by mouth daily. 30 tablet 1  . citalopram (CELEXA) 20 MG tablet Take 1 tablet by mouth  daily (Patient not taking: Reported on 07/22/2015) 90 tablet 0   No facility-administered medications prior to visit.    ROS Review of Systems  Constitutional: Negative for fever, chills, diaphoresis, appetite change, fatigue and unexpected weight change.  HENT: Negative for congestion, ear pain, rhinorrhea, sneezing, sore throat and trouble swallowing.   Eyes: Negative for pain.  Respiratory: Negative for cough, chest tightness and shortness of breath.   Cardiovascular: Positive for palpitations and leg swelling. Negative for chest pain.  Gastrointestinal: Negative for nausea, vomiting, abdominal pain, diarrhea and constipation.  Genitourinary: Negative for dysuria, frequency and menstrual problem.  Musculoskeletal: Negative for joint swelling and arthralgias.  Skin: Positive for wound. Negative for rash.  Neurological: Negative for dizziness, weakness, numbness and headaches.  Psychiatric/Behavioral: Negative for dysphoric mood and agitation.    Objective:  BP 130/73 mmHg  Pulse 70  Temp(Src) 97 F (36.1 C) (Oral)  Ht _0  (1.473 m)  Wt 181 lb (82.101 kg)  BMI 37.84 kg/m2  BP Readings from Last 3 Encounters:  07/22/15 130/73  07/15/15 113/52  07/13/15 117/67    Wt Readings from Last 3 Encounters:  07/22/15 181 lb (82.101 kg)  07/14/15 177 lb 14.6 oz (80.7 kg)  07/13/15 176 lb 3.2 oz (79.924 kg)     Physical Exam  Constitutional: She is oriented to person, place, and time. She appears well-developed and well-nourished. No distress.  HENT:  Head: Normocephalic and atraumatic.  Right Ear: External ear normal.  Left Ear: External ear normal.  Nose: Nose normal.  Mouth/Throat: Oropharynx is clear and moist.  Eyes: Conjunctivae and EOM are normal. Pupils are  equal, round, and reactive to light.  Neck: Normal range of motion. Neck supple. No thyromegaly present.  Cardiovascular: Normal rate, regular rhythm and normal heart sounds.   No murmur heard. Pulmonary/Chest: Effort normal. No respiratory distress. She has no wheezes. She has rales (occ.scattered).  Abdominal: Soft. Bowel sounds are normal. She exhibits no distension and no mass. There is no tenderness.  Lymphadenopathy:    She has no cervical adenopathy.  Neurological: She is alert and oriented to person, place, and time. She has normal reflexes.  Skin: Skin is warm and dry.  left neck and angle of mandible have what appears to be a fresh skin graft, full thickness, with surrounding weeping wound covering much of the lower lateral left cheek and angle area.:  Psychiatric: She has a normal mood and affect. Her behavior is normal. Judgment and thought content normal.    No results found for: HGBA1C  Lab Results  Component Value Date   WBC 7.7 07/22/2015   HGB 11.0* 07/15/2015   HCT 31.5* 07/22/2015   PLT 207 07/15/2015   GLUCOSE 105* 07/22/2015   CHOL 180 05/13/2015   TRIG 161* 05/13/2015   HDL 62 05/13/2015   LDLCALC 86 05/13/2015   ALT 25 07/22/2015   AST 22 07/22/2015   NA 139 07/22/2015   K 4.1 07/22/2015  CL 97 07/22/2015   CREATININE 0.89 07/22/2015   BUN 13 07/22/2015   CO2 28 07/22/2015   TSH 1.870 05/13/2015   INR 1.6 06/14/2014    Dg Chest Port 1 View  07/13/2015  CLINICAL DATA:  Acute chest pain and shortness of breath. EXAM: PORTABLE CHEST 1 VIEW COMPARISON:  July 22, 2013. FINDINGS: Stable cardiomediastinal silhouette. No pneumothorax or pleural effusion is noted. Both lungs are clear. The visualized skeletal structures are unremarkable. IMPRESSION: No acute cardiopulmonary abnormality seen. Electronically Signed   By: Marijo Conception, M.D.   On: 07/13/2015 17:18    Assessment & Plan:   Deah was seen today for hospitalization follow-up.  Diagnoses  and all orders for this visit:  H/O parotidectomy -     CBC with Differential/Platelet -     CMP14+EGFR  Gastroesophageal reflux disease without esophagitis -     CBC with Differential/Platelet -     CMP14+EGFR  Essential hypertension -     CBC with Differential/Platelet -     CMP14+EGFR  Atrial fibrillation, chronic (HCC) -     CBC with Differential/Platelet -     CMP14+EGFR  Other orders -     furosemide (LASIX) 40 MG tablet; Take 2 tablets (80 mg total) by mouth daily.   I have discontinued Ms. Kozub's amLODipine. I have also changed her furosemide. Additionally, I am having her maintain her triamcinolone cream, Vitamin D3, potassium chloride, amitriptyline, traMADol, calcium-vitamin D, oxyCODONE-acetaminophen, gabapentin, donepezil, omeprazole, levocetirizine, rOPINIRole, apixaban, diltiazem, and citalopram.  Meds ordered this encounter  Medications  . DISCONTD: amLODipine (NORVASC) 2.5 MG tablet    Sig: Take 1 tablet by mouth daily.  . furosemide (LASIX) 40 MG tablet    Sig: Take 2 tablets (80 mg total) by mouth daily.    Dispense:  60 tablet    Refill:  5     Follow-up: Return in about 3 months (around 10/22/2015).  Claretta Fraise, M.D.

## 2015-07-23 LAB — CBC WITH DIFFERENTIAL/PLATELET
Basophils Absolute: 0 x10E3/uL (ref 0.0–0.2)
Basos: 0 %
EOS (ABSOLUTE): 0.3 x10E3/uL (ref 0.0–0.4)
Eos: 4 %
Hematocrit: 31.5 % — ABNORMAL LOW (ref 34.0–46.6)
Hemoglobin: 11 g/dL — ABNORMAL LOW (ref 11.1–15.9)
Immature Grans (Abs): 0 x10E3/uL (ref 0.0–0.1)
Immature Granulocytes: 0 %
Lymphocytes Absolute: 0.8 x10E3/uL (ref 0.7–3.1)
Lymphs: 10 %
MCH: 31.9 pg (ref 26.6–33.0)
MCHC: 34.9 g/dL (ref 31.5–35.7)
MCV: 91 fL (ref 79–97)
Monocytes Absolute: 0.9 x10E3/uL (ref 0.1–0.9)
Monocytes: 12 %
Neutrophils Absolute: 5.7 x10E3/uL (ref 1.4–7.0)
Neutrophils: 74 %
Platelets: 443 x10E3/uL — ABNORMAL HIGH (ref 150–379)
RBC: 3.45 x10E6/uL — ABNORMAL LOW (ref 3.77–5.28)
RDW: 12.8 % (ref 12.3–15.4)
WBC: 7.7 x10E3/uL (ref 3.4–10.8)

## 2015-07-23 LAB — CMP14+EGFR
ALT: 25 IU/L (ref 0–32)
AST: 22 IU/L (ref 0–40)
Albumin/Globulin Ratio: 1.2 (ref 1.1–2.5)
Albumin: 3.7 g/dL (ref 3.5–4.7)
Alkaline Phosphatase: 152 IU/L — ABNORMAL HIGH (ref 39–117)
BUN/Creatinine Ratio: 15 (ref 11–26)
BUN: 13 mg/dL (ref 8–27)
Bilirubin Total: 0.3 mg/dL (ref 0.0–1.2)
CO2: 28 mmol/L (ref 18–29)
Calcium: 9.2 mg/dL (ref 8.7–10.3)
Chloride: 97 mmol/L (ref 97–106)
Creatinine, Ser: 0.89 mg/dL (ref 0.57–1.00)
GFR calc Af Amer: 67 mL/min/1.73
GFR calc non Af Amer: 58 mL/min/1.73 — ABNORMAL LOW
Globulin, Total: 3 g/dL (ref 1.5–4.5)
Glucose: 105 mg/dL — ABNORMAL HIGH (ref 65–99)
Potassium: 4.1 mmol/L (ref 3.5–5.2)
Sodium: 139 mmol/L (ref 136–144)
Total Protein: 6.7 g/dL (ref 6.0–8.5)

## 2015-07-25 ENCOUNTER — Encounter (HOSPITAL_COMMUNITY): Payer: Self-pay | Admitting: Oncology

## 2015-07-25 DIAGNOSIS — R490 Dysphonia: Secondary | ICD-10-CM | POA: Diagnosis not present

## 2015-07-25 DIAGNOSIS — R1312 Dysphagia, oropharyngeal phase: Secondary | ICD-10-CM | POA: Diagnosis not present

## 2015-07-25 DIAGNOSIS — C07 Malignant neoplasm of parotid gland: Secondary | ICD-10-CM | POA: Insufficient documentation

## 2015-07-25 HISTORY — DX: Malignant neoplasm of parotid gland: C07

## 2015-07-28 ENCOUNTER — Emergency Department (HOSPITAL_COMMUNITY): Payer: Medicare Other

## 2015-07-28 ENCOUNTER — Inpatient Hospital Stay (HOSPITAL_COMMUNITY)
Admission: EM | Admit: 2015-07-28 | Discharge: 2015-07-31 | DRG: 193 | Disposition: A | Discharge status: Home Health | Payer: Medicare Other | Attending: Internal Medicine | Admitting: Internal Medicine

## 2015-07-28 ENCOUNTER — Encounter (HOSPITAL_COMMUNITY): Payer: Self-pay | Admitting: Emergency Medicine

## 2015-07-28 DIAGNOSIS — Z91013 Allergy to seafood: Secondary | ICD-10-CM | POA: Diagnosis not present

## 2015-07-28 DIAGNOSIS — Y842 Radiological procedure and radiotherapy as the cause of abnormal reaction of the patient, or of later complication, without mention of misadventure at the time of the procedure: Secondary | ICD-10-CM | POA: Diagnosis present

## 2015-07-28 DIAGNOSIS — I10 Essential (primary) hypertension: Secondary | ICD-10-CM | POA: Diagnosis present

## 2015-07-28 DIAGNOSIS — Z86718 Personal history of other venous thrombosis and embolism: Secondary | ICD-10-CM | POA: Diagnosis not present

## 2015-07-28 DIAGNOSIS — J441 Chronic obstructive pulmonary disease with (acute) exacerbation: Secondary | ICD-10-CM | POA: Diagnosis present

## 2015-07-28 DIAGNOSIS — Z91040 Latex allergy status: Secondary | ICD-10-CM

## 2015-07-28 DIAGNOSIS — Y95 Nosocomial condition: Secondary | ICD-10-CM | POA: Diagnosis present

## 2015-07-28 DIAGNOSIS — K219 Gastro-esophageal reflux disease without esophagitis: Secondary | ICD-10-CM | POA: Diagnosis not present

## 2015-07-28 DIAGNOSIS — Z809 Family history of malignant neoplasm, unspecified: Secondary | ICD-10-CM | POA: Diagnosis not present

## 2015-07-28 DIAGNOSIS — Z96643 Presence of artificial hip joint, bilateral: Secondary | ICD-10-CM | POA: Diagnosis not present

## 2015-07-28 DIAGNOSIS — J449 Chronic obstructive pulmonary disease, unspecified: Secondary | ICD-10-CM | POA: Diagnosis not present

## 2015-07-28 DIAGNOSIS — J189 Pneumonia, unspecified organism: Secondary | ICD-10-CM | POA: Diagnosis not present

## 2015-07-28 DIAGNOSIS — C07 Malignant neoplasm of parotid gland: Secondary | ICD-10-CM | POA: Diagnosis present

## 2015-07-28 DIAGNOSIS — T2007XA Burn of unspecified degree of neck, initial encounter: Secondary | ICD-10-CM | POA: Diagnosis not present

## 2015-07-28 DIAGNOSIS — R069 Unspecified abnormalities of breathing: Secondary | ICD-10-CM | POA: Diagnosis not present

## 2015-07-28 DIAGNOSIS — R05 Cough: Secondary | ICD-10-CM | POA: Diagnosis not present

## 2015-07-28 DIAGNOSIS — R0902 Hypoxemia: Secondary | ICD-10-CM | POA: Diagnosis not present

## 2015-07-28 DIAGNOSIS — G479 Sleep disorder, unspecified: Secondary | ICD-10-CM | POA: Diagnosis not present

## 2015-07-28 DIAGNOSIS — J9601 Acute respiratory failure with hypoxia: Secondary | ICD-10-CM

## 2015-07-28 DIAGNOSIS — I482 Chronic atrial fibrillation, unspecified: Secondary | ICD-10-CM | POA: Diagnosis present

## 2015-07-28 DIAGNOSIS — Z880 Allergy status to penicillin: Secondary | ICD-10-CM | POA: Diagnosis not present

## 2015-07-28 DIAGNOSIS — M1990 Unspecified osteoarthritis, unspecified site: Secondary | ICD-10-CM | POA: Diagnosis not present

## 2015-07-28 DIAGNOSIS — R0602 Shortness of breath: Secondary | ICD-10-CM | POA: Diagnosis not present

## 2015-07-28 LAB — CBC WITH DIFFERENTIAL/PLATELET
BASOS PCT: 0 %
Basophils Absolute: 0 10*3/uL (ref 0.0–0.1)
Eosinophils Absolute: 0.2 10*3/uL (ref 0.0–0.7)
Eosinophils Relative: 2 %
HEMATOCRIT: 33.2 % — AB (ref 36.0–46.0)
HEMOGLOBIN: 10.9 g/dL — AB (ref 12.0–15.0)
Lymphocytes Relative: 6 %
Lymphs Abs: 0.6 10*3/uL — ABNORMAL LOW (ref 0.7–4.0)
MCH: 31.6 pg (ref 26.0–34.0)
MCHC: 32.8 g/dL (ref 30.0–36.0)
MCV: 96.2 fL (ref 78.0–100.0)
MONOS PCT: 14 %
Monocytes Absolute: 1.4 10*3/uL — ABNORMAL HIGH (ref 0.1–1.0)
NEUTROS ABS: 7.8 10*3/uL — AB (ref 1.7–7.7)
NEUTROS PCT: 78 %
Platelets: 521 10*3/uL — ABNORMAL HIGH (ref 150–400)
RBC: 3.45 MIL/uL — AB (ref 3.87–5.11)
RDW: 13.8 % (ref 11.5–15.5)
WBC: 10 10*3/uL (ref 4.0–10.5)

## 2015-07-28 LAB — BASIC METABOLIC PANEL
ANION GAP: 8 (ref 5–15)
BUN: 11 mg/dL (ref 6–20)
CO2: 32 mmol/L (ref 22–32)
Calcium: 8.9 mg/dL (ref 8.9–10.3)
Chloride: 97 mmol/L — ABNORMAL LOW (ref 101–111)
Creatinine, Ser: 0.66 mg/dL (ref 0.44–1.00)
GFR calc Af Amer: >60 mL/min (ref >60)
GLUCOSE: 117 mg/dL — AB (ref 65–99)
POTASSIUM: 3.8 mmol/L (ref 3.5–5.1)
Sodium: 137 mmol/L (ref 135–145)

## 2015-07-28 LAB — LACTIC ACID, PLASMA
LACTIC ACID, VENOUS: 0.7 mmol/L (ref 0.5–2.0)
Lactic Acid, Venous: 0.7 mmol/L (ref 0.5–2.0)

## 2015-07-28 LAB — TROPONIN I

## 2015-07-28 LAB — BRAIN NATRIURETIC PEPTIDE: B Natriuretic Peptide: 229 pg/mL — ABNORMAL HIGH (ref 0.0–100.0)

## 2015-07-28 MED ORDER — DEXTROSE 5 % IV SOLN
2.0000 g | Freq: Three times a day (TID) | INTRAVENOUS | Status: DC
  Administered 2015-07-28 – 2015-07-30 (×5): 2 g via INTRAVENOUS
  Filled 2015-07-28 (×12): qty 2

## 2015-07-28 MED ORDER — ALBUTEROL SULFATE (2.5 MG/3ML) 0.083% IN NEBU
2.5000 mg | INHALATION_SOLUTION | RESPIRATORY_TRACT | Status: DC | PRN
  Administered 2015-07-29 – 2015-07-30 (×2): 2.5 mg via RESPIRATORY_TRACT
  Filled 2015-07-28 (×3): qty 3

## 2015-07-28 MED ORDER — DEXTROSE 5 % IV SOLN
2.0000 g | Freq: Once | INTRAVENOUS | Status: AC
  Administered 2015-07-28: 2 g via INTRAVENOUS
  Filled 2015-07-28: qty 2

## 2015-07-28 MED ORDER — DILTIAZEM HCL ER COATED BEADS 240 MG PO CP24
240.0000 mg | ORAL_CAPSULE | Freq: Every day | ORAL | Status: DC
Start: 2015-07-28 — End: 2015-07-31
  Administered 2015-07-28 – 2015-07-31 (×4): 240 mg via ORAL
  Filled 2015-07-28 (×4): qty 1

## 2015-07-28 MED ORDER — ALBUTEROL SULFATE (2.5 MG/3ML) 0.083% IN NEBU: 2.5000 mg | INHALATION_SOLUTION | RESPIRATORY_TRACT | Status: DC | PRN

## 2015-07-28 MED ORDER — ROPINIROLE HCL 1 MG PO TABS
1.0000 mg | ORAL_TABLET | Freq: Every day | ORAL | Status: DC
Start: 2015-07-28 — End: 2015-07-31
  Administered 2015-07-28 – 2015-07-30 (×3): 1 mg via ORAL
  Filled 2015-07-28 (×3): qty 1

## 2015-07-28 MED ORDER — ACETAMINOPHEN 325 MG PO TABS: 650.0000 mg | ORAL_TABLET | Freq: Four times a day (QID) | ORAL | Status: DC | PRN

## 2015-07-28 MED ORDER — SODIUM CHLORIDE 0.9 % IJ SOLN: 3.0000 mL | INTRAMUSCULAR | Status: DC | PRN

## 2015-07-28 MED ORDER — ONDANSETRON HCL 4 MG PO TABS: 4.0000 mg | ORAL_TABLET | Freq: Four times a day (QID) | ORAL | Status: DC | PRN

## 2015-07-28 MED ORDER — VITAMIN D 1000 UNITS PO TABS
2000.0000 [IU] | ORAL_TABLET | Freq: Every day | ORAL | Status: DC
  Administered 2015-07-28 – 2015-07-31 (×4): 2000 [IU] via ORAL
  Filled 2015-07-28 (×4): qty 2

## 2015-07-28 MED ORDER — ONDANSETRON HCL 4 MG/2ML IJ SOLN
4.0000 mg | Freq: Four times a day (QID) | INTRAMUSCULAR | Status: DC | PRN
Start: 2015-07-28 — End: 2015-07-31

## 2015-07-28 MED ORDER — PANTOPRAZOLE SODIUM 40 MG PO TBEC
40.0000 mg | DELAYED_RELEASE_TABLET | Freq: Every day | ORAL | Status: DC
  Administered 2015-07-28 – 2015-07-31 (×4): 40 mg via ORAL
  Filled 2015-07-28 (×4): qty 1

## 2015-07-28 MED ORDER — ACETAMINOPHEN 650 MG RE SUPP: 650.0000 mg | Freq: Four times a day (QID) | RECTAL | Status: DC | PRN

## 2015-07-28 MED ORDER — VANCOMYCIN HCL IN DEXTROSE 750-5 MG/150ML-% IV SOLN
750.0000 mg | Freq: Two times a day (BID) | INTRAVENOUS | Status: DC
  Filled 2015-07-28 (×2): qty 150

## 2015-07-28 MED ORDER — CITALOPRAM HYDROBROMIDE 20 MG PO TABS
20.0000 mg | ORAL_TABLET | Freq: Every day | ORAL | Status: DC
  Administered 2015-07-28 – 2015-07-31 (×4): 20 mg via ORAL
  Filled 2015-07-28 (×4): qty 1

## 2015-07-28 MED ORDER — SODIUM CHLORIDE 0.9 % IV SOLN: 250.0000 mL | INTRAVENOUS | Status: DC | PRN

## 2015-07-28 MED ORDER — GABAPENTIN 300 MG PO CAPS
300.0000 mg | ORAL_CAPSULE | Freq: Two times a day (BID) | ORAL | Status: DC
Start: 2015-07-28 — End: 2015-07-31
  Administered 2015-07-28 – 2015-07-31 (×7): 300 mg via ORAL
  Filled 2015-07-28: qty 3
  Filled 2015-07-28: qty 1
  Filled 2015-07-28: qty 3
  Filled 2015-07-28 (×4): qty 1

## 2015-07-28 MED ORDER — FUROSEMIDE 80 MG PO TABS
80.0000 mg | ORAL_TABLET | Freq: Every day | ORAL | Status: DC
  Administered 2015-07-28 – 2015-07-31 (×4): 80 mg via ORAL
  Filled 2015-07-28 (×5): qty 1

## 2015-07-28 MED ORDER — APIXABAN 5 MG PO TABS
5.0000 mg | ORAL_TABLET | Freq: Two times a day (BID) | ORAL | Status: DC
  Administered 2015-07-28 – 2015-07-31 (×7): 5 mg via ORAL
  Filled 2015-07-28 (×7): qty 1

## 2015-07-28 MED ORDER — SENNOSIDES-DOCUSATE SODIUM 8.6-50 MG PO TABS: 1.0000 | ORAL_TABLET | Freq: Every evening | ORAL | Status: DC | PRN

## 2015-07-28 MED ORDER — POTASSIUM CHLORIDE CRYS ER 20 MEQ PO TBCR
20.0000 meq | EXTENDED_RELEASE_TABLET | Freq: Every day | ORAL | Status: DC
  Administered 2015-07-28 – 2015-07-31 (×4): 20 meq via ORAL
  Filled 2015-07-28 (×4): qty 1

## 2015-07-28 MED ORDER — VANCOMYCIN HCL 10 G IV SOLR
1500.0000 mg | Freq: Once | INTRAVENOUS | Status: AC
  Administered 2015-07-28: 1500 mg via INTRAVENOUS
  Filled 2015-07-28: qty 1500

## 2015-07-28 MED ORDER — DONEPEZIL HCL 5 MG PO TABS
10.0000 mg | ORAL_TABLET | Freq: Every day | ORAL | Status: DC
Start: 2015-07-28 — End: 2015-07-31
  Administered 2015-07-28 – 2015-07-30 (×3): 10 mg via ORAL
  Filled 2015-07-28 (×3): qty 2

## 2015-07-28 MED ORDER — SODIUM CHLORIDE 0.9 % IJ SOLN
3.0000 mL | Freq: Two times a day (BID) | INTRAMUSCULAR | Status: DC
  Administered 2015-07-28 – 2015-07-31 (×6): 3 mL via INTRAVENOUS

## 2015-07-28 NOTE — ED Provider Notes (Signed)
CSN: [id number]     Arrival date & time 07/28/15  0913 History   First MD Initiated Contact with Patient 07/28/15 0935     Chief Complaint  Patient presents with  . Shortness of Breath      HPI  Pt was seen at 1000. Per EMS and pt report, c/o gradual onset and worsening of persistent SOB for the past 1 week, worse since yesterday. Has been associated with moist cough. Symptoms worsen with exertion. Denies CP/palpitations, no abd pain, no N/V/D, no back pain, no fevers.   Past Medical History  Diagnosis Date  . DVT (deep venous thrombosis) ([place])   . Hypertension   . COPD (chronic obstructive pulmonary disease) ([place])   . GERD (gastroesophageal reflux disease)   . Osteoarthritis   . Ischemic colitis ([place])   . Depression   . Gastric erosion   . Insomnia   . Cataract   . Parotid mass 2016  . 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 Total hip arthroplasty      bilateral  . Knee arthroplasty      bilateral  . Esophagogastroduodenoscopy  10/17/2007    erosion   . Colonoscopy    . Back surgery    . Abdominal hysterectomy    . Cholecystectomy    . Cataracts    . Parotidectomy Left 05/17/2015    Procedure: PAROTIDECTOMY- LEFT TOTAL;  Surgeon: Leta Baptist, MD;  Location: [facility];  Service: ENT;  Laterality: Left;   Family History  Problem Relation Age of Onset  . Rheum arthritis Mother   . Ulcers Mother     on foot  . Ulcers Father   . Cancer Sister     uterine   Social History  Substance Use Topics  . Smoking status: Never Smoker   . Smokeless tobacco: Never Used  . Alcohol Use: No    Review of Systems ROS: Statement: All systems negative except as marked or noted in the HPI; Constitutional: Negative for fever and chills. ; ; Eyes: Negative for eye pain, redness and discharge. ; ; ENMT: Negative for ear pain, hoarseness, nasal congestion, sinus pressure and sore throat. ; ;  Cardiovascular: Negative for chest pain, palpitations, diaphoresis, and peripheral edema. ; ; Respiratory: +SOB, cough. Negative for wheezing and stridor. ; ; Gastrointestinal: Negative for nausea, vomiting, diarrhea, abdominal pain, blood in stool, hematemesis, jaundice and rectal bleeding. . ; ; Genitourinary: Negative for dysuria, flank pain and hematuria. ; ; Musculoskeletal: Negative for back pain and neck pain. Negative for swelling and trauma.; ; Skin: Negative for pruritus, rash, abrasions, blisters, bruising and skin lesion.; ; Neuro: Negative for headache, lightheadedness and neck stiffness. Negative for weakness, altered level of consciousness , altered mental status, extremity weakness, paresthesias, involuntary movement, seizure and syncope.      Allergies  Iohexol; Latex; Penicillins; and Shellfish allergy  Home Medications   Prior to Admission medications   Medication Sig Start Date End Date Taking? Authorizing Provider  apixaban (ELIQUIS) 5 MG TABS tablet Take 1 tablet (5 mg total) by mouth 2 (two) times daily. 07/15/15  Yes Erline Hau, MD  calcium-vitamin D 250-100 MG-UNIT per tablet Take 2 tablets by mouth daily. 05/13/15  Yes Tammy Eckard, PHARMD  Cholecalciferol (VITAMIN D3) 2000 UNITS CHEW Chew 1 tablet by mouth daily.   Yes Historical Provider, MD  citalopram (CELEXA) 20 MG tablet TAKE 1 TABLET DAILY  07/21/15  Yes Wardell Honour, MD  diltiazem (CARDIZEM CD) 240 MG 24 hr capsule Take 240 mg by mouth daily. 07/15/15  Yes Historical Provider, MD  donepezil (ARICEPT) 10 MG tablet Take 1 tablet (10 mg total) by mouth at bedtime. 06/03/15  Yes Claretta Fraise, MD  furosemide (LASIX) 40 MG tablet Take 2 tablets (80 mg total) by mouth daily. Patient taking differently: Take 80 mg by mouth 2 (two) times daily.  07/22/15  Yes Claretta Fraise, MD  gabapentin (NEURONTIN) 300 MG capsule TAKE 1 CAPSULE TWICE DAILY. 05/31/15  Yes Claretta Fraise, MD  levocetirizine (XYZAL) 5 MG  tablet Take 1 tablet (5 mg total) by mouth at bedtime. 07/12/15  Yes Chipper Herb, MD  omeprazole (PRILOSEC) 20 MG capsule TAKE 2 CAPSULES DAILY AS NEEDED FOR REFLUX Patient taking differently: Take 40 mg by mouth 2 (two) times daily before a meal. NEEDED FOR REFLUX 07/12/15  Yes Chipper Herb, MD  potassium chloride (K-DUR,KLOR-CON) 10 MEQ tablet TAKE (2) TABLETS DAILY Patient taking differently: Take 20 mEq by mouth daily.  03/10/15  Yes Claretta Fraise, MD  rOPINIRole (REQUIP) 1 MG tablet TAKE 1 OR 2 TABLETS AT  BEDTIME Patient taking differently: Take 1-2 mg by mouth at bedtime.  07/12/15  Yes Chipper Herb, MD  triamcinolone cream (KENALOG) 0.1 % Apply 1 application topically daily.   Yes Mary-Margaret Hassell Done, FNP  amitriptyline (ELAVIL) 75 MG tablet TAKE 1 TABLETS AT BEDTIME Patient not taking: Reported on 07/28/2015 04/06/15   Claretta Fraise, MD  diltiazem (CARDIZEM LA) 240 MG 24 hr tablet Take 1 tablet (240 mg total) by mouth daily. Patient not taking: Reported on 07/28/2015 07/15/15   Erline Hau, MD  oxyCODONE-acetaminophen (ROXICET) 5-325 MG per tablet Take 1 tablet by mouth every 4 (four) hours as needed for severe pain. Patient not taking: Reported on 07/28/2015 05/17/15   Leta Baptist, MD  traMADol (ULTRAM) 50 MG tablet TAKE (1) TABLET DAILY AS NEEDED. Patient not taking: Reported on 07/28/2015 05/11/15   Claretta Fraise, MD   BP 125/50 mmHg  Pulse 100  Temp(Src) 98.7 F (37.1 C) (Oral)  Resp 22  Ht 4\' 10"  (1.473 m)  Wt 181 lb (82.101 kg)  BMI 37.84 kg/m2  SpO2 95% Physical Exam  1005: Physical examination:  Nursing notes reviewed; Vital signs and O2 SAT reviewed;  Constitutional: Well developed, Well nourished, Well hydrated, In no acute distress; Head:  Normocephalic, atraumatic; Eyes: EOMI, PERRL, No scleral icterus; ENMT: Mouth and pharynx normal, Mucous membranes moist; Neck: Supple, Full range of motion, No lymphadenopathy; Cardiovascular: Irregular irregular rate and  rhythm, No gallop; Respiratory: Breath sounds coarse & equal bilaterally, No wheezes.  Speaking full sentences with ease, Normal respiratory effort/excursion; Chest: Nontender, Movement normal; Abdomen: Soft, Nontender, Nondistended, Normal bowel sounds; Genitourinary: No CVA tenderness; Extremities: Pulses normal, No tenderness, +2 pedal edema with chronic stasis changes. No calf asymmetry.; Neuro: Awake, alert, vague/rambling historian. Major CN grossly intact.  Speech clear. No gross focal motor or sensory deficits in extremities.; Skin: Color normal, Warm, Dry.   ED Course  Procedures (including critical care time) Labs Review   Imaging Review  I have personally reviewed and evaluated these images and lab results as part of my medical decision-making.   EKG Interpretation   Date/Time:  Thursday July 28 2015 09:18:37 EDT Ventricular Rate:  92 PR Interval:    QRS Duration: 92 QT Interval:  443 QTC Calculation: 548 R Axis:   58 Text Interpretation:  Atrial fibrillation Low voltage, precordial leads  Borderline T abnormalities, lateral leads Prolonged QT interval Baseline  wander When compared with ECG of 07/14/2015 QT has lengthened Confirmed by  [facility]  MD, Nunzio Cory [id number]) on 07/28/2015 10:17:14 AM      MDM  MDM Reviewed: previous chart, nursing note and vitals Reviewed previous: labs and ECG Interpretation: labs, ECG and x-ray     Results for orders placed or performed during the hospital encounter of 94/50/38  Basic metabolic panel  Result Value Ref Range   Sodium 137 135 - 145 mmol/L   Potassium 3.8 3.5 - 5.1 mmol/L   Chloride 97 (L) 101 - 111 mmol/L   CO2 32 22 - 32 mmol/L   Glucose, Bld 117 (H) 65 - 99 mg/dL   BUN 11 6 - 20 mg/dL   Creatinine, Ser 0.66 0.44 - 1.00 mg/dL   Calcium 8.9 8.9 - 10.3 mg/dL   GFR calc non Af Amer >60 >60 mL/min   GFR calc Af Amer >60 >60 mL/min   Anion gap 8 5 - 15  Brain natriuretic peptide  Result Value Ref Range   B  Natriuretic Peptide 229.0 (H) 0.0 - 100.0 pg/mL  CBC with Differential  Result Value Ref Range   WBC 10.0 4.0 - 10.5 K/uL   RBC 3.45 (L) 3.87 - 5.11 MIL/uL   Hemoglobin 10.9 (L) 12.0 - 15.0 g/dL   HCT 33.2 (L) 36.0 - 46.0 %   MCV 96.2 78.0 - 100.0 fL   MCH 31.6 26.0 - 34.0 pg   MCHC 32.8 30.0 - 36.0 g/dL   RDW 13.8 11.5 - 15.5 %   Platelets 521 (H) 150 - 400 K/uL   Neutrophils Relative % 78 %   Neutro Abs 7.8 (H) 1.7 - 7.7 K/uL   Lymphocytes Relative 6 %   Lymphs Abs 0.6 (L) 0.7 - 4.0 K/uL   Monocytes Relative 14 %   Monocytes Absolute 1.4 (H) 0.1 - 1.0 K/uL   Eosinophils Relative 2 %   Eosinophils Absolute 0.2 0.0 - 0.7 K/uL   Basophils Relative 0 %   Basophils Absolute 0.0 0.0 - 0.1 K/uL  Lactic acid, plasma  Result Value Ref Range   Lactic Acid, Venous 0.7 0.5 - 2.0 mmol/L  Troponin I  Result Value Ref Range   Troponin I <0.03 <0.031 ng/mL   Dg Chest 2 View 07/28/2015  CLINICAL DATA:  Shortness of breath with cough and congestion EXAM: CHEST  2 VIEW COMPARISON:  July 13, 2015 FINDINGS: There is patchy airspace opacity in the posterior left base. There is mild scarring in both lower lobe regions as well. Heart is enlarged with pulmonary vascular within normal limits. No adenopathy. No bone lesions. There is degenerative change in the thoracic spine. Is atherosclerotic calcification in the aorta. IMPRESSION: Patchy airspace opacity posterior left base, most likely pneumonia. Mild scarring noted in both lower lobes. Mild cardiomegaly. Atherosclerotic calcification in aorta. Followup PA and lateral chest radiographs recommended in 3-4 weeks following trial of antibiotic therapy to ensure resolution and exclude underlying malignancy. Electronically Signed   By: Lowella Grip III M.D.   On: 07/28/2015 10:09   Results for JATASIA, GUNDRUM (MRN [id number]) as of 07/28/2015 12:02  Ref. Range 07/13/2015 16:40 07/28/2015 09:18  B Natriuretic Peptide Latest Ref Range: 0.0-100.0 pg/mL  669.0 (H) 229.0 (H)   Results for LAMEKA, DISLA (MRN [id number]) as of 07/28/2015 12:02  Ref. Range 07/13/2015 16:40 07/14/2015 02:00 07/15/2015 05:30 07/22/2015 11:59 07/28/2015  09:18  Hemoglobin Latest Ref Range: 12.0-15.0 g/dL 12.1 11.2 (L) 11.0 (L) 11.0 (L) 10.9 (L)  HCT Latest Ref Range: 36.0-46.0 % 36.1 33.2 (L) 33.4 (L) 31.5 (L) 33.2 (L)    1150:  O2 Sat on R/A was 83-85%; O2 2L N/C applied with O2 Sats increasing to 93-97%. Will start IV abx for HCAP. BNP lower than previous and no overt CHF on CXR. H/H near baseline. Dx and testing d/w pt.  Questions answered.  Verb understanding, agreeable to admit.   T/C to [facility]. Jerilee Hoh, case discussed, including:  HPI, pertinent PM/SHx, VS/PE, dx testing, ED course and treatment:  Agreeable to admit, requests to write temporary orders, obtain observation medical bed to team APAdmits.      Francine Graven, DO 07/31/15 2047

## 2015-07-28 NOTE — Progress Notes (Signed)
ANTIBIOTIC CONSULT NOTE - INITIAL  Pharmacy Consult for Vancomycin Indication: rule out pneumonia  Allergies  Allergen Reactions  . Iohexol      Desc: THROAT SWELLING-NOTED IN CHART AFTER IVC PLACEMENT-ARS 12-02-05   . Latex Other (See Comments)    unknown  . Penicillins Swelling  . Shellfish Allergy Other (See Comments)    Reaction unknown   Patient Measurements: Height: 4\' 10"  (147.3 cm) Weight: 181 lb (82.101 kg) IBW/kg (Calculated) : 40.9  Vital Signs: Temp: 98 F (36.7 C) (11/03 1143) Temp Source: Oral (11/03 1143) BP: 116/48 mmHg (11/03 1200) Pulse Rate: 93 (11/03 1315) Intake/Output from previous day:   Intake/Output from this shift:    Labs:  Recent Labs  07/28/15 0918  WBC 10.0  HGB 10.9*  PLT 521*  CREATININE 0.66   Estimated Creatinine Clearance: 44 mL/min (by C-G formula based on Cr of 0.66). No results for input(s): VANCOTROUGH, VANCOPEAK, VANCORANDOM, GENTTROUGH, GENTPEAK, GENTRANDOM, TOBRATROUGH, TOBRAPEAK, TOBRARND, AMIKACINPEAK, AMIKACINTROU, AMIKACIN in the last 72 hours.   Microbiology: Recent Results (from the past 720 hour(s))  MRSA PCR Screening     Status: None   Collection Time: 07/13/15  8:40 PM  Result Value Ref Range Status   MRSA by PCR NEGATIVE NEGATIVE Final    Comment:        The GeneXpert MRSA Assay (FDA approved for NASAL specimens only), is one component of a comprehensive MRSA colonization surveillance program. It is not intended to diagnose MRSA infection nor to guide or monitor treatment for MRSA infections.    Medical History: Past Medical History  Diagnosis Date  . DVT (deep venous thrombosis) ([place])   . Hypertension   . COPD (chronic obstructive pulmonary disease) ([place])   . GERD (gastroesophageal reflux disease)   . Osteoarthritis   . Ischemic colitis ([place])   . Depression   . Gastric erosion   . Insomnia   . Cataract   . Parotid mass 2016  . 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 female  This is an 81 year old lady who is on anticoagulation therapy for DVT and now presents with a 2 day history of chest pain associated with dyspnea. She described the chest pain is aching with radiation up to her neck. She was seen by her primary care physician today who noticed that her heart rate was rapid and referred the patient to the emergency room. The patient denies any nausea, vomiting or abdominal pain. There is no cough or fever. She has not had any syncopal episodes. Evaluation in the emergency room showed her to be in atrial fibrillation with rapid ventricular response. She is now being admitted for further management.  Asked to initiate Vancomycin for suspected pna  Goal of Therapy:  Vancomycin trough level 15-20 mcg/ml  Plan:  Vancomycin 1500mg  IV x 1 (given) then Vancomycin 750mg  IV q12hrs Check trough at steady state Monitor labs, renal fxn, and c/s  Hart Robinsons A 07/28/2015,2:11 PM

## 2015-07-28 NOTE — Progress Notes (Signed)
Left neck scabbed and raw from radiation treatment after parotid surgery in [facility] by Dr. Benjamine Mola.  Patient reports "only a little pain sometimes", denies pain presently.

## 2015-07-28 NOTE — H&P (Signed)
[facility]          History and Physical    PCP:   Claretta Fraise, MD   EDP: Francine Graven, D.O.  Chief Complaint:  Shortness of breath, cough  HPI: Patient is a pleasant 81 year old woman known to me from a recent hospitalization for atrial fibrillation with rapid ventricular response. She has a past medical history significant for chronic A. fib, hypertension, GERD, history of DVT, parotid cancer, COPD who presents to the hospital with the above-mentioned complaints. She is not a very clear historian and is very vague in the timing of her symptoms; she thinks that for the past week or so she has been getting more and more short of breath, this has become a problem in the past 12 hours especially to where she called her daughter today and told her that she needed to come to the emergency department. She has also been having a cough productive of clear sputum. She denies fevers or chills, chest pain, palpitations, dizziness or lightheadedness. In the emergency department she was found to be afebrile, labs pretty unremarkable, however a chest x-ray does show patchy airspace opacity in the posterior left base which most likely represents pneumonia. She was found to be hypoxic into the mid 80s. We have been asked to admit her for further evaluation and management.  Allergies:   Allergies  Allergen Reactions  . Iohexol      Desc: THROAT SWELLING-NOTED IN CHART AFTER IVC PLACEMENT-ARS 12-02-05   . Latex Other (See Comments)    unknown  . Penicillins Swelling  . Shellfish Allergy Other (See Comments)    Reaction unknown      Past Medical History  Diagnosis Date  . DVT (deep venous thrombosis) ([place])   . Hypertension   . COPD (chronic obstructive pulmonary disease) ([place])   . GERD (gastroesophageal reflux disease)   . Osteoarthritis   . Ischemic colitis ([place])   . Depression   . Gastric erosion   . Insomnia   . Cataract   . Parotid mass 2016  . 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 Total hip arthroplasty      bilateral  . Knee arthroplasty      bilateral  . Esophagogastroduodenoscopy  10/17/2007    erosion   . Colonoscopy    . Back surgery    . Abdominal hysterectomy    . Cholecystectomy    . Cataracts    . Parotidectomy Left 05/17/2015    Procedure: PAROTIDECTOMY- LEFT TOTAL;  Surgeon: Leta Baptist, MD;  Location: [facility];  Service: ENT;  Laterality: Left;    Prior to Admission medications   Medication Sig Start Date End Date Taking? Authorizing Provider  apixaban (ELIQUIS) 5 MG TABS tablet Take 1 tablet (5 mg total) by mouth 2 (two) times daily. 07/15/15  Yes Erline Hau, MD  calcium-vitamin D 250-100 MG-UNIT per tablet Take 2 tablets by mouth daily. 05/13/15  Yes Tammy Eckard, PHARMD  Cholecalciferol (VITAMIN D3) 2000 UNITS CHEW Chew 1 tablet by mouth daily.   Yes Historical Provider, MD  citalopram (CELEXA) 20 MG tablet TAKE 1 TABLET DAILY 07/21/15  Yes Wardell Honour, MD  diltiazem (CARDIZEM CD) 240 MG 24 hr capsule Take 240 mg by mouth daily. 07/15/15  Yes Historical Provider, MD  donepezil (ARICEPT) 10 MG tablet  Take 1 tablet (10 mg total) by mouth at bedtime. 06/03/15  Yes Claretta Fraise, MD  furosemide (LASIX) 40 MG tablet Take 2 tablets (80 mg total) by mouth daily. Patient taking differently: Take 80 mg by mouth 2 (two) times daily.  07/22/15  Yes Claretta Fraise, MD  gabapentin (NEURONTIN) 300 MG capsule TAKE 1 CAPSULE TWICE DAILY. 05/31/15  Yes Claretta Fraise, MD  levocetirizine (XYZAL) 5 MG tablet Take 1 tablet (5 mg total) by mouth at bedtime. 07/12/15  Yes Chipper Herb, MD  omeprazole (PRILOSEC) 20 MG capsule TAKE 2 CAPSULES DAILY AS NEEDED FOR REFLUX Patient taking differently: Take 40 mg by mouth 2 (two) times daily before a meal. NEEDED FOR REFLUX 07/12/15  Yes Chipper Herb, MD  potassium chloride  (K-DUR,KLOR-CON) 10 MEQ tablet TAKE (2) TABLETS DAILY Patient taking differently: Take 20 mEq by mouth daily.  03/10/15  Yes Claretta Fraise, MD  rOPINIRole (REQUIP) 1 MG tablet TAKE 1 OR 2 TABLETS AT  BEDTIME Patient taking differently: Take 1-2 mg by mouth at bedtime.  07/12/15  Yes Chipper Herb, MD  triamcinolone cream (KENALOG) 0.1 % Apply 1 application topically daily.   Yes Mary-Margaret Hassell Done, FNP  amitriptyline (ELAVIL) 75 MG tablet TAKE 1 TABLETS AT BEDTIME Patient not taking: Reported on 07/28/2015 04/06/15   Claretta Fraise, MD  diltiazem (CARDIZEM LA) 240 MG 24 hr tablet Take 1 tablet (240 mg total) by mouth daily. Patient not taking: Reported on 07/28/2015 07/15/15   Erline Hau, MD  oxyCODONE-acetaminophen (ROXICET) 5-325 MG per tablet Take 1 tablet by mouth every 4 (four) hours as needed for severe pain. Patient not taking: Reported on 07/28/2015 05/17/15   Leta Baptist, MD  traMADol (ULTRAM) 50 MG tablet TAKE (1) TABLET DAILY AS NEEDED. Patient not taking: Reported on 07/28/2015 05/11/15   Claretta Fraise, MD    Social History:  reports that she has never smoked. She has never used smokeless tobacco. She reports that she does not drink alcohol or use illicit drugs.  Family History  Problem Relation Age of Onset  . Rheum arthritis Mother   . Ulcers Mother     on foot  . Ulcers Father   . Cancer Sister     uterine    Review of Systems:  Constitutional: Denies fever, chills, diaphoresis, appetite change and fatigue.  HEENT: Denies photophobia, eye pain, redness, hearing loss, ear pain, congestion, sore throat, rhinorrhea, sneezing, mouth sores, trouble swallowing, neck pain, neck stiffness and tinnitus.   Respiratory: Denies chest tightness,  and wheezing.   Cardiovascular: Denies chest pain, palpitations. Gastrointestinal: Denies nausea, vomiting, abdominal pain, diarrhea, constipation, blood in stool and abdominal distention.  Genitourinary: Denies dysuria, urgency,  frequency, hematuria, flank pain and difficulty urinating.  Endocrine: Denies: hot or cold intolerance, sweats, changes in hair or nails, polyuria, polydipsia. Musculoskeletal: Denies myalgias, back pain, joint swelling, arthralgias and gait problem.  Skin: Denies pallor, rash and wound.  Neurological: Denies dizziness, seizures, syncope, weakness, light-headedness, numbness and headaches.  Hematological: Denies adenopathy. Easy bruising, personal or family bleeding history  Psychiatric/Behavioral: Denies suicidal ideation, mood changes, confusion, nervousness, sleep disturbance and agitation   Physical Exam: Blood pressure 116/48, pulse 93, temperature 98 F (36.7 C), temperature source Oral, resp. rate 20, height $RemoveBe'4\' 10"'wmDxvWvhm$  (1.473 m), weight 82.101 kg (181 lb), SpO2 99 %. General: Alert, awake, oriented 3 HEENT: Normocephalic, atraumatic, pupils equal round and reactive to light, extraocular movements intact, edentulous, moist mucous membranes. Neck: Supple, no JVD, no  lymphadenopathy, no bruits, no goiter. Cardiovascular: Regular rate and rhythm, no murmurs, rubs or gallops. Lungs: Clear to auscultation bilaterally. Abdomen: Soft, nontender, nondistended, positive bowel sounds, no masses or organomegaly noted. Extremities: 1+ pitting edema bilaterally, chronic venous insufficiency skin color changes. Neurologic: Grossly intact and nonfocal, I have not ambulated her.  Labs on Admission:  Results for orders placed or performed during the hospital encounter of 07/28/15 (from the past 48 hour(s))  Basic metabolic panel     Status: Abnormal   Collection Time: 07/28/15  9:18 AM  Result Value Ref Range   Sodium 137 135 - 145 mmol/L   Potassium 3.8 3.5 - 5.1 mmol/L   Chloride 97 (L) 101 - 111 mmol/L   CO2 32 22 - 32 mmol/L   Glucose, Bld 117 (H) 65 - 99 mg/dL   BUN 11 6 - 20 mg/dL   Creatinine, Ser 0.66 0.44 - 1.00 mg/dL   Calcium 8.9 8.9 - 10.3 mg/dL   GFR calc non Af Amer >60 >60 mL/min     GFR calc Af Amer >60 >60 mL/min    Comment: (NOTE) The eGFR has been calculated using the CKD EPI equation. This calculation has not been validated in all clinical situations. eGFR's persistently <60 mL/min signify possible Chronic Kidney Disease.    Anion gap 8 5 - 15  Brain natriuretic peptide     Status: Abnormal   Collection Time: 07/28/15  9:18 AM  Result Value Ref Range   B Natriuretic Peptide 229.0 (H) 0.0 - 100.0 pg/mL  CBC with Differential     Status: Abnormal   Collection Time: 07/28/15  9:18 AM  Result Value Ref Range   WBC 10.0 4.0 - 10.5 K/uL   RBC 3.45 (L) 3.87 - 5.11 MIL/uL   Hemoglobin 10.9 (L) 12.0 - 15.0 g/dL   HCT 33.2 (L) 36.0 - 46.0 %   MCV 96.2 78.0 - 100.0 fL   MCH 31.6 26.0 - 34.0 pg   MCHC 32.8 30.0 - 36.0 g/dL   RDW 13.8 11.5 - 15.5 %   Platelets 521 (H) 150 - 400 K/uL   Neutrophils Relative % 78 %   Neutro Abs 7.8 (H) 1.7 - 7.7 K/uL   Lymphocytes Relative 6 %   Lymphs Abs 0.6 (L) 0.7 - 4.0 K/uL   Monocytes Relative 14 %   Monocytes Absolute 1.4 (H) 0.1 - 1.0 K/uL   Eosinophils Relative 2 %   Eosinophils Absolute 0.2 0.0 - 0.7 K/uL   Basophils Relative 0 %   Basophils Absolute 0.0 0.0 - 0.1 K/uL  Troponin I     Status: None   Collection Time: 07/28/15  9:18 AM  Result Value Ref Range   Troponin I <0.03 <0.031 ng/mL    Comment:        NO INDICATION OF MYOCARDIAL INJURY.   Lactic acid, plasma     Status: None   Collection Time: 07/28/15 10:42 AM  Result Value Ref Range   Lactic Acid, Venous 0.7 0.5 - 2.0 mmol/L  Lactic acid, plasma     Status: None   Collection Time: 07/28/15  1:06 PM  Result Value Ref Range   Lactic Acid, Venous 0.7 0.5 - 2.0 mmol/L    Radiological Exams on Admission: Dg Chest 2 View  07/28/2015  CLINICAL DATA:  Shortness of breath with cough and congestion EXAM: CHEST  2 VIEW COMPARISON:  July 13, 2015 FINDINGS: There is patchy airspace opacity in the posterior left base. There is  mild scarring in both lower lobe  regions as well. Heart is enlarged with pulmonary vascular within normal limits. No adenopathy. No bone lesions. There is degenerative change in the thoracic spine. Is atherosclerotic calcification in the aorta. IMPRESSION: Patchy airspace opacity posterior left base, most likely pneumonia. Mild scarring noted in both lower lobes. Mild cardiomegaly. Atherosclerotic calcification in aorta. Followup PA and lateral chest radiographs recommended in 3-4 weeks following trial of antibiotic therapy to ensure resolution and exclude underlying malignancy. Electronically Signed   By: Lowella Grip III M.D.   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 -Provide oxygen as needed. -See below for details.  Hospital-acquired pneumonia -Given her penicillin allergy we'll start her on vancomycin and aztreonam. -Check blood and sputum cultures. -Check Legionella and strep pneumo urinary antigens. -Follow culture data and narrow antibiotics as clinically indicated.  Chronic atrial fibrillation -Currently rate controlled on Cardizem and anticoagulated on Eliquis.  History of DVT -Continue anticoagulation.  COPD -Compensated at present, no wheezing on exam.  Parotid cancer -Continue outpatient follow-up with oncology.  DVT prophylaxis -Fully anticoagulated on Eliquis.  CODE STATUS -Full code    Time Spent on Admission: 80 minutes  [name] Walker,[name] Walker [facility] Pager: [phone] 07/28/2015, 3:25 PM
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
## 2015-07-28 NOTE — ED Notes (Signed)
Pulse Oximetry was 83-85% on room air.

## 2015-07-29 LAB — CBC
HEMATOCRIT: 32.4 % — AB (ref 36.0–46.0)
Hemoglobin: 10.4 g/dL — ABNORMAL LOW (ref 12.0–15.0)
MCH: 31 pg (ref 26.0–34.0)
MCHC: 32.1 g/dL (ref 30.0–36.0)
MCV: 96.7 fL (ref 78.0–100.0)
PLATELETS: 491 10*3/uL — AB (ref 150–400)
RBC: 3.35 MIL/uL — ABNORMAL LOW (ref 3.87–5.11)
RDW: 13.9 % (ref 11.5–15.5)
WBC: 8.5 10*3/uL (ref 4.0–10.5)

## 2015-07-29 LAB — BASIC METABOLIC PANEL
Anion gap: 8 (ref 5–15)
BUN: 10 mg/dL (ref 6–20)
CALCIUM: 8.8 mg/dL — AB (ref 8.9–10.3)
CO2: 32 mmol/L (ref 22–32)
Chloride: 98 mmol/L — ABNORMAL LOW (ref 101–111)
Creatinine, Ser: 0.68 mg/dL (ref 0.44–1.00)
Glucose, Bld: 137 mg/dL — ABNORMAL HIGH (ref 65–99)
POTASSIUM: 3.8 mmol/L (ref 3.5–5.1)
Sodium: 138 mmol/L (ref 135–145)

## 2015-07-29 LAB — STREP PNEUMONIAE URINARY ANTIGEN: STREP PNEUMO URINARY ANTIGEN: NEGATIVE

## 2015-07-29 LAB — HIV ANTIBODY (ROUTINE TESTING W REFLEX): HIV SCREEN 4TH GENERATION: NONREACTIVE

## 2015-07-29 MED ORDER — VANCOMYCIN HCL IN DEXTROSE 750-5 MG/150ML-% IV SOLN
750.0000 mg | Freq: Two times a day (BID) | INTRAVENOUS | Status: DC
  Administered 2015-07-29 – 2015-07-30 (×2): 750 mg via INTRAVENOUS
  Filled 2015-07-29 (×6): qty 150

## 2015-07-29 MED ORDER — ALBUTEROL SULFATE (2.5 MG/3ML) 0.083% IN NEBU
2.5000 mg | INHALATION_SOLUTION | RESPIRATORY_TRACT | Status: DC
  Administered 2015-07-29 – 2015-07-31 (×9): 2.5 mg via RESPIRATORY_TRACT
  Filled 2015-07-29 (×9): qty 3

## 2015-07-29 NOTE — Care Management Important Message (Signed)
Important Message  Patient Details  Name: [name] Walker MRN: [id number] Date of Birth: May 24, 1925   Medicare Important Message Given:  Yes-second notification given    Sherald Barge, RN 07/29/2015, 1:11 PM

## 2015-07-29 NOTE — Care Management Note (Signed)
Case Management Note  Patient Details  Name: [name] Walker MRN: [id number] Date of Birth: May 04, 1925  Subjective/Objective:                  Pt is from home, lives alone and is ind with ADL's at baseline. Pt has no home O2 prior to admission. Pt has walker at home for PRN use.   Action/Plan: Pt plans to return home with [facility] services. Pt has used [facility] in the past and would like them again. [place] Calix, of [facility], aware of referral and will obtain pt info from chart. Pt aware HH has 48 hours to make first visit. Pt will need home O2 assessment, if she qualifies, she would also like Oxygen from [facility]. Anticipate DC home over weekend. No further CM needs.   Expected Discharge Date:    07/31/2015              Expected Discharge Plan:  [place]  In-House Referral:  NA  Discharge planning Services  CM Consult  Post Acute Care Choice:  Home Health Choice offered to:  Patient  DME Arranged:    DME Agency:     HH Arranged:  RN, PT Delia Agency:  [facility]  Status of Service:  Completed, signed off  Medicare Important Message Given:    Date Medicare IM Given:    Medicare IM give by:    Date Additional Medicare IM Given:    Additional Medicare Important Message give by:     If discussed at [place] of Stay Meetings, dates discussed:    Additional Comments:  Sherald Barge, RN 07/29/2015, 1:08 PM
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
## 2015-07-30 LAB — BASIC METABOLIC PANEL
Anion gap: 6 (ref 5–15)
BUN: 11 mg/dL (ref 6–20)
CHLORIDE: 97 mmol/L — AB (ref 101–111)
CO2: 33 mmol/L — ABNORMAL HIGH (ref 22–32)
CREATININE: 0.6 mg/dL (ref 0.44–1.00)
Calcium: 8.7 mg/dL — ABNORMAL LOW (ref 8.9–10.3)
Glucose, Bld: 143 mg/dL — ABNORMAL HIGH (ref 65–99)
Potassium: 3.7 mmol/L (ref 3.5–5.1)
Sodium: 136 mmol/L (ref 135–145)

## 2015-07-30 LAB — LEGIONELLA PNEUMOPHILA SEROGP 1 UR AG: L. PNEUMOPHILA SEROGP 1 UR AG: NEGATIVE

## 2015-07-30 MED ORDER — LORAZEPAM 0.5 MG PO TABS
0.5000 mg | ORAL_TABLET | Freq: Once | ORAL | Status: AC
  Administered 2015-07-30: 0.5 mg via ORAL
  Filled 2015-07-30: qty 1

## 2015-07-30 MED ORDER — LEVOFLOXACIN 750 MG PO TABS
750.0000 mg | ORAL_TABLET | ORAL | Status: AC
  Administered 2015-07-30 – 2015-07-31 (×2): 750 mg via ORAL
  Filled 2015-07-30 (×2): qty 1
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
## 2015-07-30 NOTE — Progress Notes (Signed)
Patient's 02 saturation at rest without oxygen is 89%, ambulating without oxygen is 85% and ambulating with oxygen is 96%

## 2015-07-31 LAB — BASIC METABOLIC PANEL
Anion gap: 9 (ref 5–15)
BUN: 12 mg/dL (ref 6–20)
CHLORIDE: 95 mmol/L — AB (ref 101–111)
CO2: 33 mmol/L — ABNORMAL HIGH (ref 22–32)
CREATININE: 0.55 mg/dL (ref 0.44–1.00)
Calcium: 9 mg/dL (ref 8.9–10.3)
GFR calc Af Amer: >60 mL/min (ref >60)
GLUCOSE: 135 mg/dL — AB (ref 65–99)
POTASSIUM: 3.6 mmol/L (ref 3.5–5.1)
SODIUM: 137 mmol/L (ref 135–145)

## 2015-07-31 LAB — CBC
HEMATOCRIT: 32.3 % — AB (ref 36.0–46.0)
Hemoglobin: 10.4 g/dL — ABNORMAL LOW (ref 12.0–15.0)
MCH: 31.6 pg (ref 26.0–34.0)
MCHC: 32.2 g/dL (ref 30.0–36.0)
MCV: 98.2 fL (ref 78.0–100.0)
Platelets: 483 10*3/uL — ABNORMAL HIGH (ref 150–400)
RBC: 3.29 MIL/uL — ABNORMAL LOW (ref 3.87–5.11)
RDW: 13.5 % (ref 11.5–15.5)
WBC: 7 10*3/uL (ref 4.0–10.5)

## 2015-07-31 MED ORDER — LEVOFLOXACIN 750 MG PO TABS: 750.0000 mg | ORAL_TABLET | Freq: Every day | ORAL | Status: DC

## 2015-07-31 MED ORDER — IPRATROPIUM-ALBUTEROL 0.5-2.5 (3) MG/3ML IN SOLN
3.0000 mL | RESPIRATORY_TRACT | Status: DC
  Administered 2015-07-31 (×4): 3 mL via RESPIRATORY_TRACT
  Filled 2015-07-31 (×4): qty 3

## 2015-07-31 MED ORDER — IPRATROPIUM BROMIDE 0.02 % IN SOLN
RESPIRATORY_TRACT | Status: AC
  Administered 2015-07-31: 0.5 mg
  Filled 2015-07-31: qty 2.5

## 2015-07-31 MED ORDER — ALBUTEROL SULFATE (2.5 MG/3ML) 0.083% IN NEBU: 2.5000 mg | INHALATION_SOLUTION | RESPIRATORY_TRACT | Status: DC | PRN

## 2015-07-31 MED ORDER — PREDNISONE 10 MG PO TABS: 10.0000 mg | ORAL_TABLET | Freq: Every day | ORAL | Status: DC

## 2015-07-31 NOTE — Progress Notes (Signed)
Patient's oxygen level without oxygen at rest is 87% when ambulating decreases to 85%. Patient's oxygen level when placed on 02 at 2l increases to 94%.

## 2015-07-31 NOTE — Progress Notes (Signed)
CM spoke with Seth Bake representative at [facility] regarding order for [facility] services.  Patient will need oxygen to be delivered to hospital today 07/31/15.  Faxed all paperwork the agency need for processing for services.  Confirmed with Seth Bake fax was received but will need notes stating the patient's saturation levels.  CM faxed information to [facility].  CM contacted [facility] to confirm the notes were received and they were.  CM notified nurse that information was received and oxygen to be delivered to hospital.  Nurse to follow up with representative at [facility] if needed.  [phone].

## 2015-07-31 NOTE — Progress Notes (Signed)
Patient noted to begin wheezing while sleeping, this appears to be mostly upper airway noise probably from vocal cord dis function. She also appears to have upper or nasal drainage also. She is coughing up clear thick sputum also which probably upper in origin and is exacerbating her wheezes. No hx of asthma, or smoking but has dx COPD? Also added atrovent to nebulizer in hopes it will help her wheezes.

## 2015-07-31 NOTE — Discharge Summary (Signed)
Physician Discharge Summary  [name] Walker NKN:[id number] DOB: 09-18-1925 DOA: 07/28/2015  PCP: Claretta Fraise, MD  Admit date: 07/28/2015 Discharge date: 07/31/2015  Time spent: 45 minutes  Recommendations for Outpatient Follow-up:  -Will be discharged home today. -Advised to follow up with PCP in 2 weeks. -Would recommend repeat CXR in 4-6 weeks to evaluate complete resolution of PNA.  -Will be discharged on home oxygen.  Discharge Diagnoses:  Principal Problem:   HCAP (healthcare-associated pneumonia) Active Problems:   Acute respiratory failure with hypoxia (HCC)   Essential hypertension   COPD (chronic obstructive pulmonary disease) (HCC)   Atrial fibrillation, chronic (HCC)   Primary squamous cell carcinoma of parotid gland [facility])   Discharge Condition: Stable and improved  Filed Weights   07/28/15 0922  Weight: 82.101 kg (181 lb)    History of present illness:  Patient is a pleasant 81 year old woman known to me from a recent hospitalization for atrial fibrillation with rapid ventricular response. She has a past medical history significant for chronic A. fib, hypertension, GERD, history of DVT, parotid cancer, COPD who presents to the hospital with the above-mentioned complaints. She is not a very clear historian and is very vague in the timing of her symptoms; she thinks that for the past week or so she has been getting more and more short of breath, this has become a problem in the past 12 hours especially to where she called her daughter today and told her that she needed to come to the emergency department. She has also been having a cough productive of clear sputum. She denies fevers or chills, chest pain, palpitations, dizziness or lightheadedness. In the emergency department she was found to be afebrile, labs pretty unremarkable, however a chest x-ray does show patchy airspace opacity in the posterior left base which most likely represents pneumonia. She was  found to be hypoxic into the mid 80s. We were asked to admit her for further evaluation and management.  Hospital Course:   Acute Hypoxemic Respiratory Failure -Due to HCAP on top of baseline COPD. -Will need oxygen prior to DC home given sats in the 80s with ambulation.  HCAP -SOB much improved today, altho mild wheezing is persistent. -Levaquin for 5 days. -All cx data remains negative to date.  Chronic A Fib -Rate controlled on cardizem. -Anticoagulated on Eliquis.  H/o DVT -Continue anticoagulation with Eliquis.  COPD, with Exacerbation -Mild exacerbation with mild wheezing. -Prednisone taper on DC.  Parotid Cancer -With radiation burn to left neck. -Continue OP follow up with oncology.   Procedures:  None   Consultations:  None  Discharge Instructions  Discharge Instructions    Diet - low sodium heart healthy    Complete by:  As directed      Increase activity slowly    Complete by:  As directed             Medication List    STOP taking these medications        oxyCODONE-acetaminophen 5-325 MG tablet  Commonly known as:  ROXICET     potassium chloride 10 MEQ tablet  Commonly known as:  K-DUR,KLOR-CON     traMADol 50 MG tablet  Commonly known as:  ULTRAM      TAKE these medications        albuterol (2.5 MG/3ML) 0.083% nebulizer solution  Commonly known as:  PROVENTIL  Take 3 mLs (2.5 mg total) by nebulization every 2 (two) hours as needed for wheezing or shortness of breath.  amitriptyline 75 MG tablet  Commonly known as:  ELAVIL  TAKE 1 TABLETS AT BEDTIME     apixaban 5 MG Tabs tablet  Commonly known as:  ELIQUIS  Take 1 tablet (5 mg total) by mouth 2 (two) times daily.     calcium-vitamin D 250-100 MG-UNIT tablet  Take 2 tablets by mouth daily.     citalopram 20 MG tablet  Commonly known as:  CELEXA  TAKE 1 TABLET DAILY     diltiazem 240 MG 24 hr capsule  Commonly known as:  CARDIZEM CD  Take 240 mg by mouth daily.      donepezil 10 MG tablet  Commonly known as:  ARICEPT  Take 1 tablet (10 mg total) by mouth at bedtime.     furosemide 40 MG tablet  Commonly known as:  LASIX  Take 2 tablets (80 mg total) by mouth daily.     gabapentin 300 MG capsule  Commonly known as:  NEURONTIN  TAKE 1 CAPSULE TWICE DAILY.     levocetirizine 5 MG tablet  Commonly known as:  XYZAL  Take 1 tablet (5 mg total) by mouth at bedtime.     levofloxacin 750 MG tablet  Commonly known as:  LEVAQUIN  Take 1 tablet (750 mg total) by mouth daily.     omeprazole 20 MG capsule  Commonly known as:  PRILOSEC  TAKE 2 CAPSULES DAILY AS NEEDED FOR REFLUX     predniSONE 10 MG tablet  Commonly known as:  DELTASONE  Take 1 tablet (10 mg total) by mouth daily with breakfast. Take 6 tablets today and then decrease by 1 tablet daily until none are left.     rOPINIRole 1 MG tablet  Commonly known as:  REQUIP  TAKE 1 OR 2 TABLETS AT  BEDTIME     triamcinolone cream 0.1 %  Commonly known as:  KENALOG  Apply 1 application topically daily.     Vitamin D3 2000 UNITS Chew  Chew 1 tablet by mouth daily.       Allergies  Allergen Reactions  . Iohexol      Desc: THROAT SWELLING-NOTED IN CHART AFTER IVC PLACEMENT-ARS 12-02-05   . Latex Other (See Comments)    unknown  . Penicillins Swelling  . Shellfish Allergy Other (See Comments)    Reaction unknown       Follow-up Information    Follow up with [facility].   Contact information:   [address] [place] [address] [phone]       Follow up with Claretta Fraise, MD. Schedule an appointment as soon as possible for a visit in 2 weeks.   Specialty:  Family Medicine   Contact information:   [place] [address] [phone]        The results of significant diagnostics from this hospitalization (including imaging, microbiology, ancillary and laboratory) are listed below for reference.    Significant Diagnostic Studies: Dg  Chest 2 View  07/28/2015  CLINICAL DATA:  Shortness of breath with cough and congestion EXAM: CHEST  2 VIEW COMPARISON:  July 13, 2015 FINDINGS: There is patchy airspace opacity in the posterior left base. There is mild scarring in both lower lobe regions as well. Heart is enlarged with pulmonary vascular within normal limits. No adenopathy. No bone lesions. There is degenerative change in the thoracic spine. Is atherosclerotic calcification in the aorta. IMPRESSION: Patchy airspace opacity posterior left base, most likely pneumonia. Mild scarring noted in both  lower lobes. Mild cardiomegaly. Atherosclerotic calcification in aorta. Followup PA and lateral chest radiographs recommended in 3-4 weeks following trial of antibiotic therapy to ensure resolution and exclude underlying malignancy. Electronically Signed   By: Lowella Grip III M.D.   On: 07/28/2015 10:09   Dg Chest Port 1 View  07/13/2015  CLINICAL DATA:  Acute chest pain and shortness of breath. EXAM: PORTABLE CHEST 1 VIEW COMPARISON:  July 22, 2013. FINDINGS: Stable cardiomediastinal silhouette. No pneumothorax or pleural effusion is noted. Both lungs are clear. The visualized skeletal structures are unremarkable. IMPRESSION: No acute cardiopulmonary abnormality seen. Electronically Signed   By: Marijo Conception, M.D.   On: 07/13/2015 17:18    Microbiology: Recent Results (from the past 240 hour(s))  Culture, blood (routine x 2) Call MD if unable to obtain prior to antibiotics being given     Status: None (Preliminary result)   Collection Time: 07/28/15 10:37 AM  Result Value Ref Range Status   Specimen Description BLOOD LEFT ANTECUBITAL  Final   Special Requests BOTTLES DRAWN AEROBIC AND ANAEROBIC [facility] EACH  Final   Culture NO GROWTH 2 DAYS  Final   Report Status PENDING  Incomplete  Culture, blood (routine x 2) Call MD if unable to obtain prior to antibiotics being given     Status: None (Preliminary result)   Collection Time:  07/28/15  1:02 PM  Result Value Ref Range Status   Specimen Description BLOOD LEFT ANTECUBITAL  Final   Special Requests   Final    BOTTLES DRAWN AEROBIC AND ANAEROBIC AEB=12CC ANA=8CC   Culture NO GROWTH 2 DAYS  Final   Report Status PENDING  Incomplete     Labs: Basic Metabolic Panel:  Recent Labs Lab 07/28/15 0918 07/29/15 0644 07/30/15 0604 07/31/15 0632  NA 137 138 136 137  K 3.8 3.8 3.7 3.6  CL 97* 98* 97* 95*  CO2 32 32 33* 33*  GLUCOSE 117* 137* 143* 135*  BUN 11 10 11 12   CREATININE 0.66 0.68 0.60 0.55  CALCIUM 8.9 8.8* 8.7* 9.0   Liver Function Tests: No results for input(s): AST, ALT, ALKPHOS, BILITOT, PROT, ALBUMIN in the last 168 hours. No results for input(s): LIPASE, AMYLASE in the last 168 hours. No results for input(s): AMMONIA in the last 168 hours. CBC:  Recent Labs Lab 07/28/15 0918 07/29/15 0644 07/31/15 0635  WBC 10.0 8.5 7.0  NEUTROABS 7.8*  --   --   HGB 10.9* 10.4* 10.4*  HCT 33.2* 32.4* 32.3*  MCV 96.2 96.7 98.2  PLT 521* 491* 483*   Cardiac Enzymes:  Recent Labs Lab 07/28/15 0918  TROPONINI <0.03   BNP: BNP (last 3 results)  Recent Labs  07/13/15 1640 07/28/15 0918  BNP 669.0* 229.0*    ProBNP (last 3 results) No results for input(s): PROBNP in the last 8760 hours.  CBG: No results for input(s): GLUCAP in the last 168 hours.     SignedLelon Frohlich  [facility] Pager: [phone] 07/31/2015, 10:35 AM

## 2015-07-31 NOTE — Progress Notes (Signed)
Patient's oxygen has arrived from [facility] and her daughter has been informed that patient is ready to go home. Patient states understanding of discharge instructions.

## 2015-07-31 NOTE — Progress Notes (Signed)
Discharged via wheelchair in care of daughter.  Oxygen on at @2L  [place]  delivered and brought by daughter for patient to wear home.  Stable at discharge.

## 2015-08-01 ENCOUNTER — Telehealth: Payer: Self-pay | Admitting: Family Medicine

## 2015-08-02 DIAGNOSIS — I10 Essential (primary) hypertension: Secondary | ICD-10-CM | POA: Diagnosis not present

## 2015-08-02 DIAGNOSIS — Z9981 Dependence on supplemental oxygen: Secondary | ICD-10-CM | POA: Diagnosis not present

## 2015-08-02 DIAGNOSIS — J189 Pneumonia, unspecified organism: Secondary | ICD-10-CM | POA: Diagnosis not present

## 2015-08-02 DIAGNOSIS — F329 Major depressive disorder, single episode, unspecified: Secondary | ICD-10-CM | POA: Diagnosis not present

## 2015-08-02 DIAGNOSIS — K219 Gastro-esophageal reflux disease without esophagitis: Secondary | ICD-10-CM | POA: Diagnosis not present

## 2015-08-02 DIAGNOSIS — C07 Malignant neoplasm of parotid gland: Secondary | ICD-10-CM | POA: Diagnosis not present

## 2015-08-02 DIAGNOSIS — J440 Chronic obstructive pulmonary disease with acute lower respiratory infection: Secondary | ICD-10-CM | POA: Diagnosis not present

## 2015-08-02 DIAGNOSIS — Z7901 Long term (current) use of anticoagulants: Secondary | ICD-10-CM | POA: Diagnosis not present

## 2015-08-02 DIAGNOSIS — J449 Chronic obstructive pulmonary disease, unspecified: Secondary | ICD-10-CM | POA: Diagnosis not present

## 2015-08-02 DIAGNOSIS — I482 Chronic atrial fibrillation: Secondary | ICD-10-CM | POA: Diagnosis not present

## 2015-08-02 MED ORDER — NORTRIPTYLINE HCL 75 MG PO CAPS: 75.0000 mg | ORAL_CAPSULE | Freq: Every day | ORAL | Status: DC

## 2015-08-02 MED ORDER — DILTIAZEM HCL ER 120 MG PO CP12: 120.0000 mg | ORAL_CAPSULE | Freq: Two times a day (BID) | ORAL | Status: DC

## 2015-08-02 NOTE — Telephone Encounter (Signed)
Change prescription to diltiazem that is not extended release Change amitriptyline to nortriptyline Patient can have samples of eliquis if we have any

## 2015-08-02 NOTE — Telephone Encounter (Signed)
Can I change it to Cardizem 12hr 120mg  BID since this is the only generic form?

## 2015-08-02 NOTE — Telephone Encounter (Signed)
He continues this to 120 mg twice daily that we give her a total of 240 once a day

## 2015-08-02 NOTE — Telephone Encounter (Signed)
Family aware we do not get samples of eliquis and the amitriptyline and cardizem changes.

## 2015-08-03 ENCOUNTER — Ambulatory Visit (HOSPITAL_COMMUNITY): Payer: Medicare Other | Admitting: Oncology

## 2015-08-03 ENCOUNTER — Other Ambulatory Visit: Payer: Self-pay | Admitting: *Deleted

## 2015-08-03 MED ORDER — DONEPEZIL HCL 10 MG PO TABS: 10.0000 mg | ORAL_TABLET | Freq: Every day | ORAL | Status: DC

## 2015-08-03 NOTE — Progress Notes (Signed)
No show

## 2015-08-03 NOTE — Assessment & Plan Note (Deleted)
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 Benjamine Mola with broadly present residual tumor at the deep surgical margin with invasion into underlying skeletal muscle.  S/P XRT by Dr. Isidore Moos finishing on 07/13/2015.  Oncology history developed.

## 2015-08-04 ENCOUNTER — Telehealth: Payer: Self-pay | Admitting: Family Medicine

## 2015-08-05 ENCOUNTER — Telehealth: Payer: Self-pay | Admitting: Family Medicine

## 2015-08-05 DIAGNOSIS — I482 Chronic atrial fibrillation: Secondary | ICD-10-CM | POA: Diagnosis not present

## 2015-08-05 DIAGNOSIS — I10 Essential (primary) hypertension: Secondary | ICD-10-CM | POA: Diagnosis not present

## 2015-08-05 DIAGNOSIS — Z9981 Dependence on supplemental oxygen: Secondary | ICD-10-CM | POA: Diagnosis not present

## 2015-08-05 DIAGNOSIS — J449 Chronic obstructive pulmonary disease, unspecified: Secondary | ICD-10-CM | POA: Diagnosis not present

## 2015-08-05 DIAGNOSIS — Z7901 Long term (current) use of anticoagulants: Secondary | ICD-10-CM | POA: Diagnosis not present

## 2015-08-05 DIAGNOSIS — K219 Gastro-esophageal reflux disease without esophagitis: Secondary | ICD-10-CM | POA: Diagnosis not present

## 2015-08-05 DIAGNOSIS — J189 Pneumonia, unspecified organism: Secondary | ICD-10-CM | POA: Diagnosis not present

## 2015-08-05 DIAGNOSIS — F329 Major depressive disorder, single episode, unspecified: Secondary | ICD-10-CM | POA: Diagnosis not present

## 2015-08-05 DIAGNOSIS — J440 Chronic obstructive pulmonary disease with acute lower respiratory infection: Secondary | ICD-10-CM | POA: Diagnosis not present

## 2015-08-05 DIAGNOSIS — C07 Malignant neoplasm of parotid gland: Secondary | ICD-10-CM | POA: Diagnosis not present

## 2015-08-05 LAB — CULTURE, BLOOD (ROUTINE X 2)
CULTURE: NO GROWTH
Culture: NO GROWTH

## 2015-08-05 MED ORDER — HYDROXYZINE HCL 10 MG PO TABS: 10.0000 mg | ORAL_TABLET | Freq: Three times a day (TID) | ORAL | Status: DC | PRN

## 2015-08-05 NOTE — Telephone Encounter (Signed)
Pt is very anxious and needs something for anxiety She is unable to sleep She stated that she can not come in to be seen Please advise

## 2015-08-05 NOTE — Telephone Encounter (Signed)
MEd sent to pharmacy

## 2015-08-07 DIAGNOSIS — J440 Chronic obstructive pulmonary disease with acute lower respiratory infection: Secondary | ICD-10-CM | POA: Diagnosis not present

## 2015-08-07 DIAGNOSIS — Z7901 Long term (current) use of anticoagulants: Secondary | ICD-10-CM | POA: Diagnosis not present

## 2015-08-07 DIAGNOSIS — Z9981 Dependence on supplemental oxygen: Secondary | ICD-10-CM | POA: Diagnosis not present

## 2015-08-07 DIAGNOSIS — J449 Chronic obstructive pulmonary disease, unspecified: Secondary | ICD-10-CM | POA: Diagnosis not present

## 2015-08-07 DIAGNOSIS — I10 Essential (primary) hypertension: Secondary | ICD-10-CM | POA: Diagnosis not present

## 2015-08-07 DIAGNOSIS — K219 Gastro-esophageal reflux disease without esophagitis: Secondary | ICD-10-CM | POA: Diagnosis not present

## 2015-08-07 DIAGNOSIS — F329 Major depressive disorder, single episode, unspecified: Secondary | ICD-10-CM | POA: Diagnosis not present

## 2015-08-07 DIAGNOSIS — J189 Pneumonia, unspecified organism: Secondary | ICD-10-CM | POA: Diagnosis not present

## 2015-08-07 DIAGNOSIS — C07 Malignant neoplasm of parotid gland: Secondary | ICD-10-CM | POA: Diagnosis not present

## 2015-08-07 DIAGNOSIS — I482 Chronic atrial fibrillation: Secondary | ICD-10-CM | POA: Diagnosis not present

## 2015-08-08 MED ORDER — APIXABAN 5 MG PO TABS: 5.0000 mg | ORAL_TABLET | Freq: Two times a day (BID) | ORAL | Status: DC

## 2015-08-08 MED ORDER — DILTIAZEM HCL 120 MG PO TABS: 120.0000 mg | ORAL_TABLET | Freq: Two times a day (BID) | ORAL | Status: DC

## 2015-08-08 NOTE — Telephone Encounter (Signed)
Rx changed to tablets 

## 2015-08-09 DIAGNOSIS — J189 Pneumonia, unspecified organism: Secondary | ICD-10-CM | POA: Diagnosis not present

## 2015-08-09 DIAGNOSIS — I10 Essential (primary) hypertension: Secondary | ICD-10-CM | POA: Diagnosis not present

## 2015-08-09 DIAGNOSIS — Z9981 Dependence on supplemental oxygen: Secondary | ICD-10-CM | POA: Diagnosis not present

## 2015-08-09 DIAGNOSIS — K219 Gastro-esophageal reflux disease without esophagitis: Secondary | ICD-10-CM | POA: Diagnosis not present

## 2015-08-09 DIAGNOSIS — J440 Chronic obstructive pulmonary disease with acute lower respiratory infection: Secondary | ICD-10-CM | POA: Diagnosis not present

## 2015-08-09 DIAGNOSIS — J449 Chronic obstructive pulmonary disease, unspecified: Secondary | ICD-10-CM | POA: Diagnosis not present

## 2015-08-09 DIAGNOSIS — C07 Malignant neoplasm of parotid gland: Secondary | ICD-10-CM | POA: Diagnosis not present

## 2015-08-09 DIAGNOSIS — F329 Major depressive disorder, single episode, unspecified: Secondary | ICD-10-CM | POA: Diagnosis not present

## 2015-08-09 DIAGNOSIS — Z7901 Long term (current) use of anticoagulants: Secondary | ICD-10-CM | POA: Diagnosis not present

## 2015-08-09 DIAGNOSIS — I482 Chronic atrial fibrillation: Secondary | ICD-10-CM | POA: Diagnosis not present

## 2015-08-09 NOTE — Telephone Encounter (Signed)
Pt notified of RX Verbalizes understanding 

## 2015-08-09 NOTE — Telephone Encounter (Signed)
Okay to provide samples as long as available

## 2015-08-10 ENCOUNTER — Telehealth: Payer: Self-pay | Admitting: Family Medicine

## 2015-08-10 DIAGNOSIS — Z9981 Dependence on supplemental oxygen: Secondary | ICD-10-CM | POA: Diagnosis not present

## 2015-08-10 DIAGNOSIS — I10 Essential (primary) hypertension: Secondary | ICD-10-CM | POA: Diagnosis not present

## 2015-08-10 DIAGNOSIS — F329 Major depressive disorder, single episode, unspecified: Secondary | ICD-10-CM | POA: Diagnosis not present

## 2015-08-10 DIAGNOSIS — K219 Gastro-esophageal reflux disease without esophagitis: Secondary | ICD-10-CM | POA: Diagnosis not present

## 2015-08-10 DIAGNOSIS — C07 Malignant neoplasm of parotid gland: Secondary | ICD-10-CM | POA: Diagnosis not present

## 2015-08-10 DIAGNOSIS — J440 Chronic obstructive pulmonary disease with acute lower respiratory infection: Secondary | ICD-10-CM | POA: Diagnosis not present

## 2015-08-10 DIAGNOSIS — I482 Chronic atrial fibrillation: Secondary | ICD-10-CM | POA: Diagnosis not present

## 2015-08-10 DIAGNOSIS — J449 Chronic obstructive pulmonary disease, unspecified: Secondary | ICD-10-CM | POA: Diagnosis not present

## 2015-08-10 DIAGNOSIS — J189 Pneumonia, unspecified organism: Secondary | ICD-10-CM | POA: Diagnosis not present

## 2015-08-10 DIAGNOSIS — Z7901 Long term (current) use of anticoagulants: Secondary | ICD-10-CM | POA: Diagnosis not present

## 2015-08-10 NOTE — Telephone Encounter (Signed)
Patients niece aware card is ready to be picked up.

## 2015-08-11 DIAGNOSIS — I482 Chronic atrial fibrillation: Secondary | ICD-10-CM | POA: Diagnosis not present

## 2015-08-11 DIAGNOSIS — Z9981 Dependence on supplemental oxygen: Secondary | ICD-10-CM | POA: Diagnosis not present

## 2015-08-11 DIAGNOSIS — I10 Essential (primary) hypertension: Secondary | ICD-10-CM | POA: Diagnosis not present

## 2015-08-11 DIAGNOSIS — J449 Chronic obstructive pulmonary disease, unspecified: Secondary | ICD-10-CM | POA: Diagnosis not present

## 2015-08-11 DIAGNOSIS — J440 Chronic obstructive pulmonary disease with acute lower respiratory infection: Secondary | ICD-10-CM | POA: Diagnosis not present

## 2015-08-11 DIAGNOSIS — J189 Pneumonia, unspecified organism: Secondary | ICD-10-CM | POA: Diagnosis not present

## 2015-08-11 DIAGNOSIS — Z7901 Long term (current) use of anticoagulants: Secondary | ICD-10-CM | POA: Diagnosis not present

## 2015-08-11 DIAGNOSIS — K219 Gastro-esophageal reflux disease without esophagitis: Secondary | ICD-10-CM | POA: Diagnosis not present

## 2015-08-11 DIAGNOSIS — F329 Major depressive disorder, single episode, unspecified: Secondary | ICD-10-CM | POA: Diagnosis not present

## 2015-08-11 DIAGNOSIS — C07 Malignant neoplasm of parotid gland: Secondary | ICD-10-CM | POA: Diagnosis not present

## 2015-08-12 DIAGNOSIS — Z9981 Dependence on supplemental oxygen: Secondary | ICD-10-CM | POA: Diagnosis not present

## 2015-08-12 DIAGNOSIS — F329 Major depressive disorder, single episode, unspecified: Secondary | ICD-10-CM | POA: Diagnosis not present

## 2015-08-12 DIAGNOSIS — K219 Gastro-esophageal reflux disease without esophagitis: Secondary | ICD-10-CM | POA: Diagnosis not present

## 2015-08-12 DIAGNOSIS — J440 Chronic obstructive pulmonary disease with acute lower respiratory infection: Secondary | ICD-10-CM | POA: Diagnosis not present

## 2015-08-12 DIAGNOSIS — Z7901 Long term (current) use of anticoagulants: Secondary | ICD-10-CM | POA: Diagnosis not present

## 2015-08-12 DIAGNOSIS — I482 Chronic atrial fibrillation: Secondary | ICD-10-CM | POA: Diagnosis not present

## 2015-08-12 DIAGNOSIS — J449 Chronic obstructive pulmonary disease, unspecified: Secondary | ICD-10-CM | POA: Diagnosis not present

## 2015-08-12 DIAGNOSIS — C07 Malignant neoplasm of parotid gland: Secondary | ICD-10-CM | POA: Diagnosis not present

## 2015-08-12 DIAGNOSIS — I10 Essential (primary) hypertension: Secondary | ICD-10-CM | POA: Diagnosis not present

## 2015-08-12 DIAGNOSIS — J189 Pneumonia, unspecified organism: Secondary | ICD-10-CM | POA: Diagnosis not present

## 2015-08-15 ENCOUNTER — Ambulatory Visit (INDEPENDENT_AMBULATORY_CARE_PROVIDER_SITE_OTHER): Payer: Medicare Other | Admitting: Family Medicine

## 2015-08-15 ENCOUNTER — Encounter: Payer: Self-pay | Admitting: Family Medicine

## 2015-08-15 ENCOUNTER — Ambulatory Visit (INDEPENDENT_AMBULATORY_CARE_PROVIDER_SITE_OTHER): Payer: Medicare Other

## 2015-08-15 VITALS — BP 121/59 | HR 89 | Temp 97.7°F | Ht <= 58 in | Wt 178.4 lb

## 2015-08-15 DIAGNOSIS — I482 Chronic atrial fibrillation, unspecified: Secondary | ICD-10-CM

## 2015-08-15 DIAGNOSIS — J439 Emphysema, unspecified: Secondary | ICD-10-CM

## 2015-08-15 DIAGNOSIS — J189 Pneumonia, unspecified organism: Secondary | ICD-10-CM | POA: Diagnosis not present

## 2015-08-15 DIAGNOSIS — I10 Essential (primary) hypertension: Secondary | ICD-10-CM | POA: Diagnosis not present

## 2015-08-15 DIAGNOSIS — G4700 Insomnia, unspecified: Secondary | ICD-10-CM

## 2015-08-15 MED ORDER — TRAZODONE HCL 50 MG PO TABS: 50.0000 mg | ORAL_TABLET | Freq: Every day | ORAL | Status: DC

## 2015-08-15 NOTE — Progress Notes (Signed)
Subjective:  Patient ID: [name] Walker, female    DOB: 03-23-1925  Age: 81 y.o. MRN: [id number]  CC: Hospitalization Follow-up   HPI [name] Walker presents for follow-up of hospitalization. She was admitted on November 3 for respiratory failure secondary to COPD exacerbation with secondary pneumonia. She has become oxygen dependent and is due for a follow-up chest x-ray today based on discharge summary which was reviewed and is available in the chart. She has also been treated with steroids. Is unclear whether she is going to be chronically dependent or not. Just finished a taper. Of note is that her potassium was discontinued. However she continues to take furosemide. Using O2 at home and in car. She wants me to be sure to clear her to drive with DMV. She just drives locally.   Currently she denies cough, fever, chills. Not dyspneic at rest. Moderate with ambulation.  Needs something to help with sleep. Radiation makes her feel very bad.   Patient in for follow-up of atrial fibrillation. Patient denies any recent bouts of chest pain or palpitations. Additionally, patient is taking anticoagulants. Patient denies any recent excessive bleeding episodes including epistaxis, bleeding from the gums, genitalia, rectal bleeding or hematuria. Additionally there has been no excessive bruising.  History [name] Walker has a past medical history of DVT (deep venous thrombosis) ([place]); Hypertension; COPD (chronic obstructive pulmonary disease) ([place]); GERD (gastroesophageal reflux disease); Osteoarthritis; Ischemic colitis ([place]); Depression; Gastric erosion; Insomnia; Cataract; Parotid mass (2016); and Primary squamous cell carcinoma of parotid gland ([place]) (07/25/2015).   She has past surgical history that includes Total hip arthroplasty; Knee Arthroplasty; Esophagogastroduodenoscopy (10/17/2007); Colonoscopy; Back surgery; Abdominal hysterectomy; Cholecystectomy; Cataracts; and Parotidectomy (Left,  05/17/2015).   Her family history includes Cancer in her sister; Rheum arthritis in her mother; Ulcers in her father and mother.She reports that she has never smoked. She has never used smokeless tobacco. She reports that she does not drink alcohol or use illicit drugs.  Outpatient Prescriptions Prior to Visit  Medication Sig Dispense Refill  . albuterol (PROVENTIL) (2.5 MG/3ML) 0.083% nebulizer solution Take 3 mLs (2.5 mg total) by nebulization every 2 (two) hours as needed for wheezing or shortness of breath. 75 mL 12  . apixaban (ELIQUIS) 5 MG TABS tablet Take 1 tablet (5 mg total) by mouth 2 (two) times daily. 60 tablet 2  . calcium-vitamin D 250-100 MG-UNIT per tablet Take 2 tablets by mouth daily.    . Cholecalciferol (VITAMIN D3) 2000 UNITS CHEW Chew 1 tablet by mouth daily.    . citalopram (CELEXA) 20 MG tablet TAKE 1 TABLET DAILY 30 tablet 2  . diltiazem (CARDIZEM) 120 MG tablet Take 1 tablet (120 mg total) by mouth 2 (two) times daily. 180 tablet 2  . donepezil (ARICEPT) 10 MG tablet Take 1 tablet (10 mg total) by mouth at bedtime. 90 tablet 0  . furosemide (LASIX) 40 MG tablet Take 2 tablets (80 mg total) by mouth daily. (Patient taking differently: Take 80 mg by mouth 2 (two) times daily. ) 60 tablet 5  . gabapentin (NEURONTIN) 300 MG capsule TAKE 1 CAPSULE TWICE DAILY. 180 capsule 0  . hydrOXYzine (ATARAX/VISTARIL) 10 MG tablet Take 1 tablet (10 mg total) by mouth 3 (three) times daily as needed. For nerves 40 tablet 2  . levocetirizine (XYZAL) 5 MG tablet Take 1 tablet (5 mg total) by mouth at bedtime. 90 tablet 1  . nortriptyline (PAMELOR) 75 MG capsule Take 1 capsule (75 mg total) by mouth at bedtime.  90 capsule 0  . omeprazole (PRILOSEC) 20 MG capsule TAKE 2 CAPSULES DAILY AS NEEDED FOR REFLUX (Patient taking differently: Take 40 mg by mouth 2 (two) times daily before a meal. NEEDED FOR REFLUX) 180 capsule 1  . rOPINIRole (REQUIP) 1 MG tablet TAKE 1 OR 2 TABLETS AT  BEDTIME  (Patient taking differently: Take 1-2 mg by mouth at bedtime. ) 180 tablet 0  . triamcinolone cream (KENALOG) 0.1 % Apply 1 application topically daily. 30 g 1  . predniSONE (DELTASONE) 10 MG tablet Take 1 tablet (10 mg total) by mouth daily with breakfast. Take 6 tablets today and then decrease by 1 tablet daily until none are left. (Patient not taking: Reported on 08/15/2015) 21 tablet 0   No facility-administered medications prior to visit.    ROS Review of Systems  Constitutional: Negative for fever, chills, diaphoresis, appetite change, fatigue and unexpected weight change.  HENT: Negative for congestion, ear pain, hearing loss, postnasal drip, rhinorrhea, sneezing, sore throat and trouble swallowing.   Eyes: Negative for pain.  Respiratory: Positive for shortness of breath. Negative for cough and chest tightness.   Cardiovascular: Negative for chest pain and palpitations.  Gastrointestinal: Negative for nausea, vomiting, abdominal pain, diarrhea and constipation.  Genitourinary: Negative for dysuria, frequency and menstrual problem.  Musculoskeletal: Negative for joint swelling and arthralgias.  Skin: Negative for rash.  Neurological: Negative for dizziness, weakness, numbness and headaches.  Psychiatric/Behavioral: Positive for sleep disturbance. Negative for dysphoric mood and agitation.    Objective:  BP 121/59 mmHg  Pulse 89  Temp(Src) 97.7 F (36.5 C) (Oral)  Ht _0  (1.473 m)  Wt 178 lb 6.4 oz (80.922 kg)  BMI 37.30 kg/m2  SpO2 96%  BP Readings from Last 3 Encounters:  08/15/15 121/59  07/31/15 141/47  07/22/15 130/73    Wt Readings from Last 3 Encounters:  08/15/15 178 lb 6.4 oz (80.922 kg)  07/28/15 181 lb (82.101 kg)  07/22/15 181 lb (82.101 kg)     Physical Exam  Constitutional: She is oriented to person, place, and time. No distress.  Elderly, frail  HENT:  Head: Normocephalic and atraumatic.  Right Ear: External ear normal.  Left Ear: External  ear normal.  Nose: Nose normal.  Mouth/Throat: Oropharynx is clear and moist.  Edentulous   Eyes: Conjunctivae and EOM are normal. Pupils are equal, round, and reactive to light.  Neck: Normal range of motion. Neck supple. No thyromegaly present.  Cardiovascular: Normal rate, regular rhythm and normal heart sounds.   No murmur heard. Pulmonary/Chest: Effort normal and breath sounds normal. No respiratory distress. She has no wheezes. She has no rales.  Abdominal: Soft. Bowel sounds are normal. She exhibits no distension. There is no tenderness.  Lymphadenopathy:    She has no cervical adenopathy.  Neurological: She is alert and oriented to person, place, and time. She has normal reflexes.  Skin: Skin is warm and dry.  Healing lesion, left jawline from recent parotidectomy   Psychiatric: She has a normal mood and affect. Her behavior is normal. Judgment and thought content normal.    No results found for: HGBA1C  Lab Results  Component Value Date   WBC 7.0 07/31/2015   HGB 10.4* 07/31/2015   HCT 32.3* 07/31/2015   PLT 483* 07/31/2015   GLUCOSE 135* 07/31/2015   CHOL 180 05/13/2015   TRIG 161* 05/13/2015   HDL 62 05/13/2015   LDLCALC 86 05/13/2015   ALT 25 07/22/2015   AST 22 07/22/2015  NA 137 07/31/2015   K 3.6 07/31/2015   CL 95* 07/31/2015   CREATININE 0.55 07/31/2015   BUN 12 07/31/2015   CO2 33* 07/31/2015   TSH 1.870 05/13/2015   INR 1.6 06/14/2014    Dg Chest 2 View  07/28/2015  CLINICAL DATA:  Shortness of breath with cough and congestion EXAM: CHEST  2 VIEW COMPARISON:  July 13, 2015 FINDINGS: There is patchy airspace opacity in the posterior left base. There is mild scarring in both lower lobe regions as well. Heart is enlarged with pulmonary vascular within normal limits. No adenopathy. No bone lesions. There is degenerative change in the thoracic spine. Is atherosclerotic calcification in the aorta. IMPRESSION: Patchy airspace opacity posterior left base,  most likely pneumonia. Mild scarring noted in both lower lobes. Mild cardiomegaly. Atherosclerotic calcification in aorta. Followup PA and lateral chest radiographs recommended in 3-4 weeks following trial of antibiotic therapy to ensure resolution and exclude underlying malignancy. Electronically Signed   By: Lowella Grip III M.D.   On: 07/28/2015 10:09    Assessment & Plan:   [name] Walker was seen today for hospitalization follow-up.  Diagnoses and all orders for this visit:  Atrial fibrillation, chronic ([place]) -     CMP14+EGFR -     CBC with Differential/Platelet -     DG Chest 2 View; Future  Pulmonary emphysema, unspecified emphysema type ([place]) -     CMP14+EGFR -     CBC with Differential/Platelet -     DG Chest 2 View; Future  HCAP (healthcare-associated pneumonia) -     CMP14+EGFR -     CBC with Differential/Platelet -     DG Chest 2 View; Future  Essential hypertension -     CMP14+EGFR -     CBC with Differential/Platelet -     DG Chest 2 View; Future  Insomnia  Other orders -     traZODone (DESYREL) 50 MG tablet; Take 1 tablet (50 mg total) by mouth at bedtime. For sleep   I have discontinued [name] Walker's predniSONE. I am also having her start on traZODone. Additionally, I am having her maintain her triamcinolone cream, Vitamin D3, calcium-vitamin D, gabapentin, omeprazole, levocetirizine, rOPINIRole, citalopram, furosemide, albuterol, nortriptyline, donepezil, hydrOXYzine, apixaban, and diltiazem.  Meds ordered this encounter  Medications  . traZODone (DESYREL) 50 MG tablet    Sig: Take 1 tablet (50 mg total) by mouth at bedtime. For sleep    Dispense:  30 tablet    Refill:  2     Follow-up: Return in about 6 weeks (around 09/26/2015) for COPD.  Claretta Fraise, M.D.

## 2015-08-16 LAB — CBC WITH DIFFERENTIAL/PLATELET
BASOS ABS: 0 10*3/uL (ref 0.0–0.2)
Basos: 0 %
EOS (ABSOLUTE): 0.1 10*3/uL (ref 0.0–0.4)
Eos: 0 %
HEMOGLOBIN: 11.7 g/dL (ref 11.1–15.9)
Hematocrit: 35 % (ref 34.0–46.6)
IMMATURE GRANS (ABS): 0 10*3/uL (ref 0.0–0.1)
Immature Granulocytes: 0 %
LYMPHS: 4 %
Lymphocytes Absolute: 0.5 10*3/uL — ABNORMAL LOW (ref 0.7–3.1)
MCH: 30.5 pg (ref 26.6–33.0)
MCHC: 33.4 g/dL (ref 31.5–35.7)
MCV: 91 fL (ref 79–97)
MONOCYTES: 6 %
Monocytes Absolute: 0.7 10*3/uL (ref 0.1–0.9)
NEUTROS ABS: 10.8 10*3/uL — AB (ref 1.4–7.0)
Neutrophils: 90 %
PLATELETS: 379 10*3/uL (ref 150–379)
RBC: 3.84 x10E6/uL (ref 3.77–5.28)
RDW: 15.4 % (ref 12.3–15.4)
WBC: 12 10*3/uL — AB (ref 3.4–10.8)

## 2015-08-16 LAB — CMP14+EGFR
A/G RATIO: 1.4 (ref 1.1–2.5)
ALK PHOS: 102 IU/L (ref 39–117)
ALT: 21 IU/L (ref 0–32)
AST: 27 IU/L (ref 0–40)
Albumin: 3.8 g/dL (ref 3.5–4.7)
BILIRUBIN TOTAL: 0.5 mg/dL (ref 0.0–1.2)
BUN/Creatinine Ratio: 18 (ref 11–26)
BUN: 13 mg/dL (ref 8–27)
CHLORIDE: 92 mmol/L — AB (ref 97–106)
CO2: 29 mmol/L (ref 18–29)
Calcium: 9 mg/dL (ref 8.7–10.3)
Creatinine, Ser: 0.74 mg/dL (ref 0.57–1.00)
GFR calc non Af Amer: 73 mL/min/{1.73_m2} (ref >59)
GFR, EST AFRICAN AMERICAN: 84 mL/min/{1.73_m2} (ref >59)
GLUCOSE: 121 mg/dL — AB (ref 65–99)
Globulin, Total: 2.8 g/dL (ref 1.5–4.5)
POTASSIUM: 3.9 mmol/L (ref 3.5–5.2)
Sodium: 137 mmol/L (ref 136–144)
TOTAL PROTEIN: 6.6 g/dL (ref 6.0–8.5)

## 2015-08-20 DIAGNOSIS — C07 Malignant neoplasm of parotid gland: Secondary | ICD-10-CM | POA: Diagnosis not present

## 2015-08-20 DIAGNOSIS — J440 Chronic obstructive pulmonary disease with acute lower respiratory infection: Secondary | ICD-10-CM | POA: Diagnosis not present

## 2015-08-20 DIAGNOSIS — Z7901 Long term (current) use of anticoagulants: Secondary | ICD-10-CM | POA: Diagnosis not present

## 2015-08-20 DIAGNOSIS — Z9981 Dependence on supplemental oxygen: Secondary | ICD-10-CM | POA: Diagnosis not present

## 2015-08-20 DIAGNOSIS — F329 Major depressive disorder, single episode, unspecified: Secondary | ICD-10-CM | POA: Diagnosis not present

## 2015-08-20 DIAGNOSIS — J449 Chronic obstructive pulmonary disease, unspecified: Secondary | ICD-10-CM | POA: Diagnosis not present

## 2015-08-20 DIAGNOSIS — I482 Chronic atrial fibrillation: Secondary | ICD-10-CM | POA: Diagnosis not present

## 2015-08-20 DIAGNOSIS — I10 Essential (primary) hypertension: Secondary | ICD-10-CM | POA: Diagnosis not present

## 2015-08-20 DIAGNOSIS — K219 Gastro-esophageal reflux disease without esophagitis: Secondary | ICD-10-CM | POA: Diagnosis not present

## 2015-08-20 DIAGNOSIS — J189 Pneumonia, unspecified organism: Secondary | ICD-10-CM | POA: Diagnosis not present

## 2015-08-22 NOTE — Assessment & Plan Note (Deleted)
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 Benjamine Mola with broadly present residual tumor at the deep surgical margin with invasion into underlying skeletal muscle.  S/P XRT by Dr. Isidore Moos finishing on 07/13/2015.  Oncology history is up-to-date.  Labs today: CBC diff, CMET  Labs in 3 months: CBC diff, CMET  Return in 3 months for follow-up.

## 2015-08-22 NOTE — Progress Notes (Signed)
No show

## 2015-08-23 ENCOUNTER — Other Ambulatory Visit: Payer: Self-pay | Admitting: Family Medicine

## 2015-08-23 ENCOUNTER — Ambulatory Visit (HOSPITAL_COMMUNITY): Payer: Medicare Other | Admitting: Oncology

## 2015-08-23 DIAGNOSIS — Z923 Personal history of irradiation: Secondary | ICD-10-CM | POA: Diagnosis not present

## 2015-08-23 DIAGNOSIS — C07 Malignant neoplasm of parotid gland: Secondary | ICD-10-CM | POA: Diagnosis not present

## 2015-08-23 NOTE — Telephone Encounter (Signed)
Last seen 08/15/15  Dr Livia Snellen  This med not on EPIC List  If approved print

## 2015-08-24 ENCOUNTER — Encounter: Payer: Self-pay | Admitting: Family Medicine

## 2015-08-24 ENCOUNTER — Ambulatory Visit (INDEPENDENT_AMBULATORY_CARE_PROVIDER_SITE_OTHER): Payer: Medicare Other | Admitting: Family Medicine

## 2015-08-24 VITALS — BP 127/56 | HR 82 | Temp 97.2°F | Ht <= 58 in | Wt 174.0 lb

## 2015-08-24 DIAGNOSIS — J189 Pneumonia, unspecified organism: Secondary | ICD-10-CM

## 2015-08-24 DIAGNOSIS — I10 Essential (primary) hypertension: Secondary | ICD-10-CM | POA: Diagnosis not present

## 2015-08-24 DIAGNOSIS — J449 Chronic obstructive pulmonary disease, unspecified: Secondary | ICD-10-CM | POA: Diagnosis not present

## 2015-08-24 DIAGNOSIS — C07 Malignant neoplasm of parotid gland: Secondary | ICD-10-CM

## 2015-08-24 DIAGNOSIS — J440 Chronic obstructive pulmonary disease with acute lower respiratory infection: Secondary | ICD-10-CM

## 2015-08-24 DIAGNOSIS — R531 Weakness: Secondary | ICD-10-CM

## 2015-08-24 DIAGNOSIS — E538 Deficiency of other specified B group vitamins: Secondary | ICD-10-CM | POA: Diagnosis not present

## 2015-08-24 NOTE — Progress Notes (Addendum)
Subjective:  Patient ID: [name] Walker, female    DOB: Apr 04, 1925  Age: 81 y.o. MRN: [id number]  CC: Wound Check   HPI [name] Walker presents for No energy. Stays busy with house work, decorating for Christmas. Can't do as much as she wanted to do because of fatigue. Pt recently noticed a knot on her right forearm. oncerned it might be a tumor. She has been going through radiation for parotid Ca.  Refuses to take diltiazem due to side effects.DIdn't try it but read about them. Wants to wait and see what happens.  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   She has past surgical history that includes Total hip arthroplasty; Knee Arthroplasty; Esophagogastroduodenoscopy (10/17/2007); Colonoscopy; Back surgery; Abdominal hysterectomy; Cholecystectomy; Cataracts; and Parotidectomy (Left, 05/17/2015).   Her family history includes Cancer in her sister; Rheum arthritis in her mother; Ulcers in her father and mother.She reports that she has never smoked. She has never used smokeless tobacco. She reports that she does not drink alcohol or use illicit drugs.  Outpatient Prescriptions Prior to Visit  Medication Sig Dispense Refill  . albuterol (PROVENTIL) (2.5 MG/3ML) 0.083% nebulizer solution Take 3 mLs (2.5 mg total) by nebulization every 2 (two) hours as needed for wheezing or shortness of breath. 75 mL 12  . apixaban (ELIQUIS) 5 MG TABS tablet Take 1 tablet (5 mg total) by mouth 2 (two) times daily. 60 tablet 2  . calcium-vitamin D 250-100 MG-UNIT per tablet Take 2 tablets by mouth daily.    . Cholecalciferol (VITAMIN D3) 2000 UNITS CHEW Chew 1 tablet by mouth daily.     . citalopram (CELEXA) 20 MG tablet TAKE 1 TABLET DAILY 30 tablet 2  . diltiazem (CARDIZEM) 120 MG tablet Take 1 tablet (120 mg total) by mouth 2 (two) times daily. 180 tablet 2  . donepezil (ARICEPT) 10 MG tablet Take 1 tablet (10 mg total) by mouth at bedtime. 90 tablet 0  . furosemide (LASIX) 40 MG tablet Take 2 tablets (80 mg total) by mouth daily. (Patient taking differently: Take 80 mg by mouth 2 (two) times daily. ) 60 tablet 5  . gabapentin (NEURONTIN) 300 MG capsule TAKE 1 CAPSULE TWICE DAILY. 180 capsule 0  . hydrOXYzine (ATARAX/VISTARIL) 10 MG tablet Take 1 tablet (10 mg total) by mouth 3 (three) times daily as needed. For nerves 40 tablet 2  . levocetirizine (XYZAL) 5 MG tablet Take 1 tablet (5 mg total) by mouth at bedtime. 90 tablet 1  . nortriptyline (PAMELOR) 75 MG capsule Take 1 capsule (75 mg total) by mouth at bedtime. 90 capsule 0  . omeprazole (PRILOSEC) 20 MG capsule TAKE 2 CAPSULES DAILY AS NEEDED FOR REFLUX (Patient taking differently: Take 40 mg by mouth 2 (two) times daily before a meal. NEEDED FOR REFLUX) 180 capsule 1  . rOPINIRole (REQUIP) 1 MG tablet TAKE 1 OR 2 TABLETS AT  BEDTIME (Patient taking differently: Take 1-2 mg by mouth at bedtime. ) 180 tablet 0  . traMADol (ULTRAM) 50 MG tablet TAKE (1) TABLET DAILY AS NEEDED. 30 tablet 0  . triamcinolone cream (KENALOG) 0.1 % Apply 1 application topically daily. 30 g 1  . traZODone (DESYREL) 50 MG tablet Take 1 tablet (50 mg total) by mouth at bedtime.  For sleep (Patient not taking: Reported on 08/24/2015) 30 tablet 2   No facility-administered medications prior to visit.    ROS Review of Systems  Constitutional: Negative for fever, activity change and appetite change.  HENT: Negative for congestion, rhinorrhea and sore throat.   Eyes: Negative for visual disturbance.  Respiratory: Negative for cough and shortness of breath.   Cardiovascular: Negative for chest pain and palpitations.  Gastrointestinal: Negative  for nausea, abdominal pain and diarrhea.  Genitourinary: Negative for dysuria.  Musculoskeletal: Negative for myalgias and arthralgias.    Objective:  BP 127/56 mmHg  Pulse 82  Temp(Src) 97.2 F (36.2 C) (Oral)  Ht 4\' 10"  (1.473 m)  Wt 174 lb (78.926 kg)  BMI 36.38 kg/m2  SpO2 96%  BP Readings from Last 3 Encounters:  08/24/15 127/56  08/15/15 121/59  07/31/15 141/47    Wt Readings from Last 3 Encounters:  08/24/15 174 lb (78.926 kg)  08/15/15 178 lb 6.4 oz (80.922 kg)  07/28/15 181 lb (82.101 kg)     Physical Exam  Constitutional: She is oriented to person, place, and time. She appears well-developed and well-nourished. No distress.  HENT:  Head: Normocephalic and atraumatic.  Right Ear: External ear normal.  Left Ear: External ear normal.  Nose: Nose normal.  Mouth/Throat: Oropharynx is clear and moist.  Eyes: Conjunctivae and EOM are normal. Pupils are equal, round, and reactive to light.  Neck: Normal range of motion. Neck supple. No thyromegaly present.  Cardiovascular: Normal rate, regular rhythm and normal heart sounds.   No murmur heard. Pulmonary/Chest: Effort normal and breath sounds normal. No respiratory distress. She has no wheezes. She has no rales.  Abdominal: Soft. Bowel sounds are normal. She exhibits no distension. There is no tenderness.  Lymphadenopathy:    She has no cervical adenopathy.  Neurological: She is alert and oriented to person, place, and time. She has normal reflexes.  Skin: Skin is warm and dry.  Psychiatric: She has a normal mood and affect. Her behavior is normal. Judgment and thought content normal.    No results found for: HGBA1C  Lab Results  Component Value Date   WBC 12.0* 08/15/2015   HGB 10.4* 07/31/2015   HCT 35.0 08/15/2015   PLT 483* 07/31/2015   GLUCOSE 121* 08/15/2015   CHOL 180 05/13/2015   TRIG 161* 05/13/2015   HDL 62 05/13/2015   LDLCALC 86 05/13/2015   ALT 21 08/15/2015   AST 27 08/15/2015   NA 137  08/15/2015   K 3.9 08/15/2015   CL 92* 08/15/2015   CREATININE 0.74 08/15/2015   BUN 13 08/15/2015   CO2 29 08/15/2015   TSH 1.870 05/13/2015   INR 1.6 06/14/2014    Dg Chest 2 View  07/28/2015  CLINICAL DATA:  Shortness of breath with cough and congestion EXAM: CHEST  2 VIEW COMPARISON:  July 13, 2015 FINDINGS: There is patchy airspace opacity in the posterior left base. There is mild scarring in both lower lobe regions as well. Heart is enlarged with pulmonary vascular within normal limits. No adenopathy. No bone lesions. There is degenerative change in the thoracic spine. Is atherosclerotic calcification in the aorta. IMPRESSION: Patchy airspace opacity posterior left base, most likely pneumonia. Mild scarring noted in both lower lobes. Mild cardiomegaly. Atherosclerotic calcification in aorta. Followup PA and lateral chest radiographs recommended in 3-4 weeks following trial of antibiotic therapy to ensure resolution and exclude underlying malignancy. Electronically Signed   By: Lowella Grip III M.D.  On: 07/28/2015 10:09    Assessment & Plan:   Chidera was seen today for wound check.  Diagnoses and all orders for this visit:  Essential hypertension -     Vitamin B12  Weakness -     Vitamin B12  Primary squamous cell carcinoma of parotid gland (HCC) -     Vitamin B12  B12 deficiency -     Vitamin B12   I am having Ms. Coate maintain her triamcinolone cream, Vitamin D3, calcium-vitamin D, gabapentin, omeprazole, levocetirizine, rOPINIRole, citalopram, furosemide, albuterol, nortriptyline, donepezil, hydrOXYzine, apixaban, diltiazem, traZODone, and traMADol.  No orders of the defined types were placed in this encounter.     Follow-up: Return in about 1 month (around 09/23/2015), or if symptoms worsen or fail to improve.  Claretta Fraise, M.D.

## 2015-08-25 ENCOUNTER — Ambulatory Visit (HOSPITAL_COMMUNITY): Payer: Medicare Other | Admitting: Oncology

## 2015-08-26 LAB — VITAMIN B12: Vitamin B-12: 433 pg/mL (ref 211–946)

## 2015-08-28 NOTE — Progress Notes (Addendum)
Claretta Fraise, [place] [address]  Primary squamous cell carcinoma of parotid gland ([place]) - Plan: CBC with Differential, Comprehensive metabolic panel  Hypokalemia - Plan: potassium chloride (K-DUR,KLOR-CON) 10 MEQ tablet  CURRENT THERAPY: Surveillance  INTERVAL HISTORY: NUHA VALDIVIESO 81 y.o. 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 Benjamine Mola with broadly present residual tumor at the deep surgical margin with invasion into underlying skeletal muscle. S/P XRT by Dr. Isidore Moos finishing on 07/13/2015.    Primary squamous cell carcinoma of parotid gland (HCC)   04/18/2015 Imaging CT neck- Well-circumscribed mass in the inferior aspect of the superficial portion of the left parotid gland with some calcification along the lateral wall. Inner margin cannot be clearly separated from the sternocleidomastoid muscle.    05/17/2015 Procedure Left total parotidectomy by Dr. Benjamine Mola   05/17/2015 Pathology Results Parotid gland, Left - INVASIVE SQUAMOUS CELL CARCINOMA, SEE COMMENT. - POSITIVE FOR LYMPH VASCULAR INVASION. - TUMOR IS BROADLY PRESENT AT DEEP SURGICAL MARGIN. - TUMOR IS 5 MM FROM PERIPHERAL MARGIN. - TUMOR INVOLVES OVERLYING ULCERATED SKIN. - TUM   06/09/2015 PET scan No clear evidence of local residual disease at the LEFT parotid resection site. No hypermetabolic cervical lymph node in the LEFT or RIGHT.   06/16/2015 - 07/13/2015 Radiation Therapy Left parotid tumor bed, 9 Mev electrons, 50 Gy in 20 fractions, Dr. Isidore Moos.   07/28/2015 - 07/31/2015 Hospital Admission [facility]    I personally reviewed and went over laboratory results with the patient.  The results are noted within this dictation.  We will update labs today.  She notes that she is recovering from radiation.  She notes that her appetite is slow to improve.  Her weight is stable at this time.  She  reports "cracked skin" on her bottom, posterior to her anus.  She reports that she has a history of this.  Previously, she treated it with an ointment called "fanny only."  An Internet search for this was unsuccessful.  She otherwise denies any complaints.  Past Medical History  Diagnosis Date  . DVT (deep venous thrombosis) ([place])   . Hypertension   . COPD (chronic obstructive pulmonary disease) ([place])   . GERD (gastroesophageal reflux disease)   . Osteoarthritis   . Ischemic colitis ([place])   . Depression   . Gastric erosion   . Insomnia   . Cataract   . Parotid mass 2016  . Primary squamous cell carcinoma of parotid gland ([place]) 07/25/2015    has ANEMIA, NORMOCYTIC; DEPRESSION; Essential hypertension; COPD (chronic obstructive pulmonary disease) ([place]); GERD; IRRITABLE BOWEL SYNDROME; Weakness; Osteopenia; H/O parotidectomy; Atrial fibrillation, chronic ([place]); Primary squamous cell carcinoma of parotid gland ([place]); HCAP (healthcare-associated pneumonia); Insomnia; and B12 deficiency on her problem list.     is allergic to iohexol; latex; penicillins; and shellfish allergy.  Current Outpatient Prescriptions on File Prior to Visit  Medication Sig Dispense Refill  . albuterol (PROVENTIL) (2.5 MG/3ML) 0.083% nebulizer solution Take 3 mLs (2.5 mg total) by nebulization every 2 (two) hours as needed for wheezing or shortness of breath. 75 mL 12  . apixaban (ELIQUIS) 5 MG TABS tablet Take 1 tablet (5 mg total) by mouth 2 (two) times daily. 60 tablet 2  . calcium-vitamin D 250-100 MG-UNIT per tablet Take 2 tablets by mouth daily.    . Cholecalciferol (VITAMIN D3) 2000 UNITS CHEW Chew 1  tablet by mouth daily.    . citalopram (CELEXA) 20 MG tablet TAKE 1 TABLET DAILY 30 tablet 2  . diltiazem (CARDIZEM) 120 MG tablet Take 1 tablet (120 mg total) by mouth 2 (two) times daily. 180 tablet 2  . donepezil (ARICEPT) 10 MG tablet Take 1 tablet (10 mg total) by mouth at bedtime. 90 tablet 0  . furosemide  (LASIX) 40 MG tablet Take 2 tablets (80 mg total) by mouth daily. (Patient taking differently: Take 80 mg by mouth 2 (two) times daily. ) 60 tablet 5  . hydrOXYzine (ATARAX/VISTARIL) 10 MG tablet Take 1 tablet (10 mg total) by mouth 3 (three) times daily as needed. For nerves 40 tablet 2  . levocetirizine (XYZAL) 5 MG tablet Take 1 tablet (5 mg total) by mouth at bedtime. 90 tablet 1  . omeprazole (PRILOSEC) 20 MG capsule TAKE 2 CAPSULES DAILY AS NEEDED FOR REFLUX (Patient taking differently: Take 40 mg by mouth 2 (two) times daily before a meal. NEEDED FOR REFLUX) 180 capsule 1  . rOPINIRole (REQUIP) 1 MG tablet TAKE 1 OR 2 TABLETS AT  BEDTIME (Patient taking differently: Take 1-2 mg by mouth at bedtime. ) 180 tablet 0  . nortriptyline (PAMELOR) 75 MG capsule Take 1 capsule (75 mg total) by mouth at bedtime. (Patient not taking: Reported on 08/29/2015) 90 capsule 0  . traMADol (ULTRAM) 50 MG tablet TAKE (1) TABLET DAILY AS NEEDED. (Patient not taking: Reported on 08/29/2015) 30 tablet 0  . traZODone (DESYREL) 50 MG tablet Take 1 tablet (50 mg total) by mouth at bedtime. For sleep (Patient not taking: Reported on 08/24/2015) 30 tablet 2  . triamcinolone cream (KENALOG) 0.1 % Apply 1 application topically daily. (Patient not taking: Reported on 08/29/2015) 30 g 1   No current facility-administered medications on file prior to visit.    Past Surgical History  Procedure Laterality Date  . Total hip arthroplasty      bilateral  . Knee arthroplasty      bilateral  . Esophagogastroduodenoscopy  10/17/2007    erosion   . Colonoscopy    . Back surgery    . Abdominal hysterectomy    . Cholecystectomy    . Cataracts    . Parotidectomy Left 05/17/2015    Procedure: PAROTIDECTOMY- LEFT TOTAL;  Surgeon: Leta Baptist, MD;  Location: [facility];  Service: ENT;  Laterality: Left;    Denies any headaches, dizziness, double vision, fevers, chills, night sweats, nausea, vomiting, diarrhea,  constipation, chest pain, heart palpitations, shortness of breath, blood in stool, black tarry stool, urinary pain, urinary burning, urinary frequency, hematuria.   PHYSICAL EXAMINATION  ECOG PERFORMANCE STATUS: 2 - Symptomatic, <50% confined to bed  Filed Vitals:   08/29/15 1308  BP: 125/66  Pulse: 92  Temp: 98.3 F (36.8 C)  Resp: 18    GENERAL:alert, no distress, cooperative, smiling and accompanied by her daughter, Vaughan Basta. SKIN: skin color, texture, turgor are normal, no rashes or significant lesions HEAD: Normocephalic, No masses, lesions, tenderness or abnormalities EYES: normal, EOMI, Conjunctiva are pink and non-injected EARS: External ears normal OROPHARYNX:lips, buccal mucosa, and tongue normal, edentulous and mucous membranes are moist  NECK: supple, trachea midline, left cervical neck erythema with dry skin sloughing off. LYMPH:  not examined BREAST:not examined LUNGS: clear to auscultation  HEART: regular rate & rhythm ABDOMEN:abdomen soft and normal bowel sounds BACK: Back symmetric, no curvature. EXTREMITIES:less then 2 second capillary refill, no joint deformities, effusion, or inflammation, no cyanosis, positive  findings:  B/L LE skin changes/hyperpigmentation.  NEURO: alert & oriented x 3 with fluent speech, no focal motor/sensory deficits, in wheelchair.   LABORATORY DATA: CBC    Component Value Date/Time   WBC 6.5 08/29/2015 1449   WBC 12.0* 08/15/2015 1450   WBC 8.6 07/15/2014 1544   RBC 3.56* 08/29/2015 1449   RBC 3.84 08/15/2015 1450   RBC 4.1 07/15/2014 1544   HGB 11.0* 08/29/2015 1449   HGB 12.9 07/15/2014 1544   HCT 33.4* 08/29/2015 1449   HCT 35.0 08/15/2015 1450   HCT 38.9 07/15/2014 1544   PLT 225 08/29/2015 1449   MCV 93.8 08/29/2015 1449   MCV 95.9 07/15/2014 1544   MCH 30.9 08/29/2015 1449   MCH 30.5 08/15/2015 1450   MCH 31.9* 07/15/2014 1544   MCHC 32.9 08/29/2015 1449   MCHC 33.4 08/15/2015 1450   MCHC 33.2 07/15/2014 1544    RDW 15.4 08/29/2015 1449   RDW 15.4 08/15/2015 1450   LYMPHSABS 1.1 08/29/2015 1449   LYMPHSABS 0.5* 08/15/2015 1450   MONOABS 0.9 08/29/2015 1449   EOSABS 0.4 08/29/2015 1449   BASOSABS 0.0 08/29/2015 1449   BASOSABS 0.0 08/15/2015 1450      Chemistry      Component Value Date/Time   NA 138 08/29/2015 1449   NA 137 08/15/2015 1450   K 3.1* 08/29/2015 1449   CL 99* 08/29/2015 1449   CO2 33* 08/29/2015 1449   BUN 9 08/29/2015 1449   BUN 13 08/15/2015 1450   CREATININE 0.68 08/29/2015 1449   CREATININE 0.85 01/28/2013 1303      Component Value Date/Time   CALCIUM 8.3* 08/29/2015 1449   ALKPHOS 86 08/29/2015 1449   AST 28 08/29/2015 1449   ALT 22 08/29/2015 1449   BILITOT 0.4 08/29/2015 1449   BILITOT 0.5 08/15/2015 1450        PENDING LABS:   RADIOGRAPHIC STUDIES:  Dg Chest 2 View  08/15/2015  CLINICAL DATA:  Emphysema. EXAM: CHEST  2 VIEW COMPARISON:  None. FINDINGS: Mediastinum and hilar structures normal. Cardiomegaly with normal pulmonary vascularity. Low lung volumes with mild left base atelectasis and/or infiltrate. Small left pleural effusion. No pneumothorax. IMPRESSION: 1. Low lung volumes with mild left base atelectasis and/or infiltrate. Small left pleural effusion. 2. Cardiomegaly. 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 Benjamine Mola with broadly present residual tumor at the deep surgical margin with invasion into underlying skeletal muscle.  S/P XRT by Dr. Isidore Moos finishing on 07/13/2015.  Oncology history is up-to-date.  Labs today: CBC diff, CMET  Labs in 3 months: CBC diff, CMET  I have recommended A+D ointment to her buttock area for skin healing.  She is to follow-up with her primary care provider as directed.   She is to follow-up with Rad Onc as directed.  She notes that she has an upcoming appointment with Dr. Benjamine Mola (ENT).  Return in 3 months for follow-up.  Addendum: Hypokalemia is noted.  Will address with potassium replacement.  Will defer further management to her primary care provider as this is not oncologically related.    THERAPY PLAN:  We will continue with surveillance.  Her staging PET prior to radiation did not reveal metastatic disease.    All questions were  answered. The patient knows to call the clinic with any problems, questions or concerns. We can certainly see the patient much sooner if necessary.  Patient and plan discussed with Dr. Ancil Linsey and she is in agreement with the aforementioned.   This note is electronically signed by: Robynn Pane, PA-C 08/29/2015 4:20 PM

## 2015-08-28 NOTE — Assessment & Plan Note (Addendum)
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 Benjamine Mola with broadly present residual tumor at the deep surgical margin with invasion into underlying skeletal muscle.  S/P XRT by Dr. Isidore Moos finishing on 07/13/2015.  Oncology history is up-to-date.  Labs today: CBC diff, CMET  Labs in 3 months: CBC diff, CMET  I have recommended A+D ointment to her buttock area for skin healing.  She is to follow-up with her primary care provider as directed.  She is to follow-up with Rad Onc as directed.  She notes that she has an upcoming appointment with Dr. Benjamine Mola (ENT).  Return in 3 months for follow-up.  Addendum: Hypokalemia is noted.  Will address with potassium replacement.  Will defer further management to her primary care provider as this is not oncologically related.

## 2015-08-29 ENCOUNTER — Encounter (HOSPITAL_COMMUNITY): Payer: Medicare Other | Attending: Hematology & Oncology | Admitting: Oncology

## 2015-08-29 ENCOUNTER — Encounter (HOSPITAL_COMMUNITY): Payer: Self-pay | Admitting: Oncology

## 2015-08-29 ENCOUNTER — Other Ambulatory Visit: Payer: Self-pay

## 2015-08-29 VITALS — BP 125/66 | HR 92 | Temp 98.3°F | Resp 18 | Wt 175.0 lb

## 2015-08-29 DIAGNOSIS — E876 Hypokalemia: Secondary | ICD-10-CM | POA: Diagnosis not present

## 2015-08-29 DIAGNOSIS — C07 Malignant neoplasm of parotid gland: Secondary | ICD-10-CM | POA: Diagnosis not present

## 2015-08-29 LAB — COMPREHENSIVE METABOLIC PANEL
ALK PHOS: 86 U/L (ref 38–126)
ALT: 22 U/L (ref 14–54)
AST: 28 U/L (ref 15–41)
Albumin: 3.1 g/dL — ABNORMAL LOW (ref 3.5–5.0)
Anion gap: 6 (ref 5–15)
BILIRUBIN TOTAL: 0.4 mg/dL (ref 0.3–1.2)
BUN: 9 mg/dL (ref 6–20)
CALCIUM: 8.3 mg/dL — AB (ref 8.9–10.3)
CO2: 33 mmol/L — ABNORMAL HIGH (ref 22–32)
CREATININE: 0.68 mg/dL (ref 0.44–1.00)
Chloride: 99 mmol/L — ABNORMAL LOW (ref 101–111)
GFR calc Af Amer: >60 mL/min (ref >60)
Glucose, Bld: 90 mg/dL (ref 65–99)
Potassium: 3.1 mmol/L — ABNORMAL LOW (ref 3.5–5.1)
Sodium: 138 mmol/L (ref 135–145)
TOTAL PROTEIN: 6.7 g/dL (ref 6.5–8.1)

## 2015-08-29 LAB — CBC WITH DIFFERENTIAL/PLATELET
BASOS ABS: 0 10*3/uL (ref 0.0–0.1)
Basophils Relative: 0 %
Eosinophils Absolute: 0.4 10*3/uL (ref 0.0–0.7)
Eosinophils Relative: 5 %
HEMATOCRIT: 33.4 % — AB (ref 36.0–46.0)
Hemoglobin: 11 g/dL — ABNORMAL LOW (ref 12.0–15.0)
LYMPHS PCT: 17 %
Lymphs Abs: 1.1 10*3/uL (ref 0.7–4.0)
MCH: 30.9 pg (ref 26.0–34.0)
MCHC: 32.9 g/dL (ref 30.0–36.0)
MCV: 93.8 fL (ref 78.0–100.0)
MONO ABS: 0.9 10*3/uL (ref 0.1–1.0)
Monocytes Relative: 14 %
NEUTROS ABS: 4.2 10*3/uL (ref 1.7–7.7)
Neutrophils Relative %: 64 %
Platelets: 225 10*3/uL (ref 150–400)
RBC: 3.56 MIL/uL — AB (ref 3.87–5.11)
RDW: 15.4 % (ref 11.5–15.5)
WBC: 6.5 10*3/uL (ref 4.0–10.5)

## 2015-08-29 MED ORDER — GABAPENTIN 300 MG PO CAPS: ORAL_CAPSULE | ORAL | Status: DC

## 2015-08-29 MED ORDER — POTASSIUM CHLORIDE CRYS ER 10 MEQ PO TBCR: 40.0000 meq | EXTENDED_RELEASE_TABLET | Freq: Every day | ORAL | Status: DC

## 2015-08-29 NOTE — Addendum Note (Signed)
Addended by: Baird Cancer on: 08/29/2015 04:21 PM   Modules accepted: Orders

## 2015-08-29 NOTE — Patient Instructions (Signed)
..  [place] at [facility] Discharge Instructions  RECOMMENDATIONS MADE BY THE CONSULTANT AND ANY TEST RESULTS WILL BE SENT TO YOUR REFERRING PHYSICIAN.  Labs today  Labs in 3 months and return to see Dr. In 3 months  Thank you for choosing [place] at [facility] to provide your oncology and hematology care.  To afford each patient quality time with our provider, please arrive at least 15 minutes before your scheduled appointment time.    You need to re-schedule your appointment should you arrive 10 or more minutes late.  We strive to give you quality time with our providers, and arriving late affects you and other patients whose appointments are after yours.  Also, if you no show three or more times for appointments you may be dismissed from the clinic at the providers discretion.     Again, thank you for choosing [facility].  Our hope is that these requests will decrease the amount of time that you wait before being seen by our physicians.       _____________________________________________________________  Should you have questions after your visit to [facility], please contact our office at [phone] between the hours of 8:30 a.m. and 4:30 p.m.  Voicemails left after 4:30 p.m. will not be returned until the following business day.  For prescription refill requests, have your pharmacy contact our office.

## 2015-08-29 NOTE — Progress Notes (Signed)
..  [name] Walker's reason for visit today is for labs as scheduled per MD orders.  Venipuncture performed with a 23 gauge butterfly needle to R Antecubital.  [name] Walker tolerated procedure well and without incident; questions were answered and patient was discharged.

## 2015-08-30 ENCOUNTER — Other Ambulatory Visit: Payer: Self-pay | Admitting: Family Medicine

## 2015-08-30 ENCOUNTER — Ambulatory Visit: Payer: Medicare Other | Admitting: Family Medicine

## 2015-08-30 DIAGNOSIS — J449 Chronic obstructive pulmonary disease, unspecified: Secondary | ICD-10-CM | POA: Diagnosis not present

## 2015-08-31 ENCOUNTER — Ambulatory Visit: Payer: Medicare Other | Admitting: Family Medicine

## 2015-09-01 ENCOUNTER — Ambulatory Visit: Payer: Medicare Other | Admitting: Family Medicine

## 2015-09-02 ENCOUNTER — Ambulatory Visit: Payer: Medicare Other | Admitting: Family Medicine

## 2015-09-06 ENCOUNTER — Other Ambulatory Visit: Payer: Self-pay

## 2015-09-06 MED ORDER — DONEPEZIL HCL 10 MG PO TABS: 10.0000 mg | ORAL_TABLET | Freq: Every day | ORAL | Status: DC

## 2015-09-06 MED ORDER — APIXABAN 5 MG PO TABS: 5.0000 mg | ORAL_TABLET | Freq: Two times a day (BID) | ORAL | Status: DC

## 2015-09-13 ENCOUNTER — Other Ambulatory Visit: Payer: Self-pay | Admitting: Family Medicine

## 2015-09-22 ENCOUNTER — Other Ambulatory Visit: Payer: Self-pay | Admitting: Family Medicine

## 2015-09-28 ENCOUNTER — Other Ambulatory Visit: Payer: Self-pay | Admitting: Family Medicine

## 2015-09-28 NOTE — Telephone Encounter (Signed)
Please review and advise.

## 2015-09-28 NOTE — Telephone Encounter (Signed)
Last filled 08/26/15, last seen 08/24/15. Rx will print

## 2015-10-02 NOTE — Telephone Encounter (Signed)
rx called into [facility] per Dr Livia Snellen

## 2015-10-20 ENCOUNTER — Ambulatory Visit (INDEPENDENT_AMBULATORY_CARE_PROVIDER_SITE_OTHER): Payer: Medicare Other | Admitting: Otolaryngology

## 2015-10-25 ENCOUNTER — Other Ambulatory Visit: Payer: Self-pay | Admitting: Family Medicine

## 2015-10-26 NOTE — Telephone Encounter (Signed)
Last seen 08/24/15 Dr Livia Snellen  If approved print

## 2015-10-27 ENCOUNTER — Ambulatory Visit (INDEPENDENT_AMBULATORY_CARE_PROVIDER_SITE_OTHER): Payer: Commercial Managed Care - HMO | Admitting: Otolaryngology

## 2015-10-27 ENCOUNTER — Other Ambulatory Visit: Payer: Self-pay | Admitting: *Deleted

## 2015-10-27 DIAGNOSIS — R221 Localized swelling, mass and lump, neck: Secondary | ICD-10-CM

## 2015-10-27 MED ORDER — DILTIAZEM HCL ER COATED BEADS 240 MG PO CP24: 240.0000 mg | ORAL_CAPSULE | Freq: Every day | ORAL | Status: DC

## 2015-11-09 ENCOUNTER — Other Ambulatory Visit (HOSPITAL_COMMUNITY): Payer: Self-pay | Admitting: Oncology

## 2015-11-16 ENCOUNTER — Other Ambulatory Visit: Payer: Self-pay | Admitting: Family Medicine

## 2015-11-17 ENCOUNTER — Ambulatory Visit (INDEPENDENT_AMBULATORY_CARE_PROVIDER_SITE_OTHER): Payer: Commercial Managed Care - HMO | Admitting: Family Medicine

## 2015-11-17 ENCOUNTER — Encounter: Payer: Self-pay | Admitting: Family Medicine

## 2015-11-17 VITALS — BP 126/88 | HR 89 | Temp 97.3°F | Ht <= 58 in | Wt 164.0 lb

## 2015-11-17 DIAGNOSIS — J439 Emphysema, unspecified: Secondary | ICD-10-CM

## 2015-11-17 DIAGNOSIS — I10 Essential (primary) hypertension: Secondary | ICD-10-CM

## 2015-11-17 DIAGNOSIS — E538 Deficiency of other specified B group vitamins: Secondary | ICD-10-CM

## 2015-11-17 DIAGNOSIS — D649 Anemia, unspecified: Secondary | ICD-10-CM

## 2015-11-17 DIAGNOSIS — I482 Chronic atrial fibrillation, unspecified: Secondary | ICD-10-CM

## 2015-11-17 DIAGNOSIS — K219 Gastro-esophageal reflux disease without esophagitis: Secondary | ICD-10-CM

## 2015-11-17 NOTE — Patient Instructions (Addendum)
Please bring all of your medications to your next office visit.

## 2015-11-17 NOTE — Progress Notes (Signed)
Subjective:  Patient ID: [name] Walker, female    DOB: 14-Jan-1925  Age: 82 y.o. MRN: [id number]  CC: Hypertension; Fatigue; and Gastroesophageal Reflux   HPI [name] Walker presents for Patient in for follow-up of GERD. Currently asymptomatic taking  PPI daily. There is no chest pain or heartburn. No hematemesis and no melena. No dysphagia or choking. Onset is remote. Progression is stable. Complicating factors, none.    follow-up of hypertension. Patient has no history of headache chest pain or shortness of breath or recent cough. Patient also denies symptoms of TIA such as numbness weakness lateralizing. Patient checks  blood pressure at home and has not had any elevated readings recently. Patient says she decided not to take that heart pill cassette made her feel bad. It still very unclear which one she was referring to whether it be elevated this diltiazem diltiazem XT furosemide or perhaps even something else.  Patient has a history of COPD. She is still using oxygen at night but feeling okay overall. She does have some fatigue that keeps her from doing more than just basic activities. She continues to use her dementia medicine. She denies depression.     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   She has past surgical history that includes Total hip arthroplasty; Knee Arthroplasty; Esophagogastroduodenoscopy (10/17/2007); Colonoscopy; Back surgery; Abdominal hysterectomy; Cholecystectomy; Cataracts; and Parotidectomy (Left, 05/17/2015).   Her family history includes Cancer in her sister; Rheum arthritis in her mother; Ulcers in her father and mother.She  reports that she has never smoked. She has never used smokeless tobacco. She reports that she does not drink alcohol or use illicit drugs.    ROS Review of Systems  Constitutional: Negative for fever, activity change and appetite change.  HENT: Negative for congestion, rhinorrhea and sore throat.   Eyes: Negative for visual disturbance.  Respiratory: Negative for cough and shortness of breath.   Cardiovascular: Negative for chest pain and palpitations.  Gastrointestinal: Negative for nausea, abdominal pain and diarrhea.  Genitourinary: Negative for dysuria.  Musculoskeletal: Negative for myalgias and arthralgias.    Objective:  BP 126/88 mmHg  Pulse 89  Temp(Src) 97.3 F (36.3 C) (Oral)  Ht '4\' 10"'$  (1.473 m)  Wt 164 lb (74.39 kg)  BMI 34.29 kg/m2  SpO2 96%  BP Readings from Last 3 Encounters:  11/17/15 126/88  08/29/15 125/66  08/24/15 127/56    Wt Readings from Last 3 Encounters:  11/17/15 164 lb (74.39 kg)  08/29/15 175 lb (79.379 kg)  08/24/15 174 lb (78.926 kg)     Physical Exam  Constitutional: She is oriented to person, place, and time. She appears well-developed and well-nourished. No distress.  HENT:  Head: Normocephalic and atraumatic.  Right Ear: External ear normal.  Left Ear: External ear normal.  Nose: Nose normal.  Mouth/Throat: Oropharynx is clear and moist.  Eyes: Conjunctivae and EOM are normal. Pupils are equal, round, and reactive to light.  Neck: Normal range of motion. Neck supple. No thyromegaly present.  Cardiovascular: Normal rate, regular rhythm and normal heart sounds.   No murmur heard. Pulmonary/Chest: Effort normal and breath sounds normal. No respiratory distress. She has no wheezes. She has no rales.  Abdominal: Soft. Bowel sounds are normal. She exhibits no distension. There  is no tenderness.  Lymphadenopathy:    She has no cervical adenopathy.  Neurological: She is alert and oriented to person, place, and time. She has normal  reflexes.  Skin: Skin is warm and dry.  Psychiatric: She has a normal mood and affect. Her behavior is normal. Judgment and thought content normal.     Lab Results  Component Value Date   WBC 6.5 08/29/2015   HGB 11.0* 08/29/2015   HCT 33.4* 08/29/2015   PLT 225 08/29/2015   GLUCOSE 90 08/29/2015   CHOL 180 05/13/2015   TRIG 161* 05/13/2015   HDL 62 05/13/2015   LDLCALC 86 05/13/2015   ALT 22 08/29/2015   AST 28 08/29/2015   NA 138 08/29/2015   K 3.1* 08/29/2015   CL 99* 08/29/2015   CREATININE 0.68 08/29/2015   BUN 9 08/29/2015   CO2 33* 08/29/2015   TSH 1.870 05/13/2015   INR 1.6 06/14/2014    Dg Chest 2 View  07/28/2015  CLINICAL DATA:  Shortness of breath with cough and congestion EXAM: CHEST  2 VIEW COMPARISON:  July 13, 2015 FINDINGS: There is patchy airspace opacity in the posterior left base. There is mild scarring in both lower lobe regions as well. Heart is enlarged with pulmonary vascular within normal limits. No adenopathy. No bone lesions. There is degenerative change in the thoracic spine. Is atherosclerotic calcification in the aorta. IMPRESSION: Patchy airspace opacity posterior left base, most likely pneumonia. Mild scarring noted in both lower lobes. Mild cardiomegaly. Atherosclerotic calcification in aorta. Followup PA and lateral chest radiographs recommended in 3-4 weeks following trial of antibiotic therapy to ensure resolution and exclude underlying malignancy. Electronically Signed   By: Lowella Grip III M.D.   On: 07/28/2015 10:09    Assessment & Plan:   Milton was seen today for hypertension, fatigue and gastroesophageal reflux.  Diagnoses and all orders for this visit:  ANEMIA, NORMOCYTIC -     CBC with Differential/Platelet -     CMP14+EGFR  Atrial fibrillation, chronic (HCC) -     CMP14+EGFR  B12 deficiency -     CMP14+EGFR  Pulmonary emphysema, unspecified emphysema type (HCC) -     CMP14+EGFR  Essential hypertension -      CMP14+EGFR  Gastroesophageal reflux disease without esophagitis -     CBC with Differential/Platelet -     CMP14+EGFR    Continue nighttime O2.  I am having [name] Walker maintain her triamcinolone cream, Vitamin D3, calcium-vitamin D, omeprazole, levocetirizine, rOPINIRole, citalopram, furosemide, albuterol, nortriptyline, diltiazem, traZODone, gabapentin, amitriptyline, donepezil, apixaban, traMADol, diltiazem, potassium chloride, and hydrOXYzine.  No orders of the defined types were placed in this encounter.     Follow-up: No Follow-up on file.  Claretta Fraise, M.D.

## 2015-11-18 LAB — CBC WITH DIFFERENTIAL/PLATELET
BASOS: 0 %
Basophils Absolute: 0 10*3/uL (ref 0.0–0.2)
EOS (ABSOLUTE): 0.2 10*3/uL (ref 0.0–0.4)
Eos: 3 %
Hematocrit: 34.5 % (ref 34.0–46.6)
Hemoglobin: 11.6 g/dL (ref 11.1–15.9)
IMMATURE GRANULOCYTES: 0 %
Immature Grans (Abs): 0 10*3/uL (ref 0.0–0.1)
Lymphocytes Absolute: 1.3 10*3/uL (ref 0.7–3.1)
Lymphs: 20 %
MCH: 30.7 pg (ref 26.6–33.0)
MCHC: 33.6 g/dL (ref 31.5–35.7)
MCV: 91 fL (ref 79–97)
MONOS ABS: 0.9 10*3/uL (ref 0.1–0.9)
Monocytes: 14 %
NEUTROS PCT: 63 %
Neutrophils Absolute: 3.9 10*3/uL (ref 1.4–7.0)
PLATELETS: 251 10*3/uL (ref 150–379)
RBC: 3.78 x10E6/uL (ref 3.77–5.28)
RDW: 14.9 % (ref 12.3–15.4)
WBC: 6.3 10*3/uL (ref 3.4–10.8)

## 2015-11-18 LAB — CMP14+EGFR
A/G RATIO: 1.6 (ref 1.1–2.5)
ALK PHOS: 103 IU/L (ref 39–117)
ALT: 15 IU/L (ref 0–32)
AST: 24 IU/L (ref 0–40)
Albumin: 4 g/dL (ref 3.5–4.7)
BUN/Creatinine Ratio: 12 (ref 11–26)
BUN: 13 mg/dL (ref 8–27)
Bilirubin Total: 0.3 mg/dL (ref 0.0–1.2)
CALCIUM: 9.2 mg/dL (ref 8.7–10.3)
CHLORIDE: 101 mmol/L (ref 96–106)
CO2: 27 mmol/L (ref 18–29)
Creatinine, Ser: 1.07 mg/dL — ABNORMAL HIGH (ref 0.57–1.00)
GFR calc Af Amer: 54 mL/min/{1.73_m2} — ABNORMAL LOW (ref >59)
GFR, EST NON AFRICAN AMERICAN: 46 mL/min/{1.73_m2} — AB (ref >59)
Globulin, Total: 2.5 g/dL (ref 1.5–4.5)
Glucose: 84 mg/dL (ref 65–99)
POTASSIUM: 3.9 mmol/L (ref 3.5–5.2)
Sodium: 143 mmol/L (ref 134–144)
Total Protein: 6.5 g/dL (ref 6.0–8.5)

## 2015-11-22 ENCOUNTER — Other Ambulatory Visit: Payer: Self-pay | Admitting: Family Medicine

## 2015-11-28 ENCOUNTER — Ambulatory Visit (HOSPITAL_COMMUNITY): Payer: Medicare Other | Admitting: Hematology & Oncology

## 2015-11-28 ENCOUNTER — Other Ambulatory Visit (HOSPITAL_COMMUNITY): Payer: Medicare Other

## 2015-11-29 NOTE — Progress Notes (Signed)
This encounter was created in error - please disregard.

## 2015-11-30 ENCOUNTER — Other Ambulatory Visit: Payer: Self-pay | Admitting: Family Medicine

## 2015-12-01 NOTE — Telephone Encounter (Signed)
Last filled 11/02/15, last seen 11/17/15. Pt is 82 y.o. And doesn't want to have to get someone to pick up Rx twice, could we call in the 5 pills?

## 2015-12-01 NOTE — Telephone Encounter (Signed)
Phoned in #5 to [facility]

## 2015-12-06 ENCOUNTER — Other Ambulatory Visit: Payer: Self-pay | Admitting: Family Medicine

## 2015-12-08 ENCOUNTER — Other Ambulatory Visit: Payer: Self-pay | Admitting: *Deleted

## 2015-12-08 NOTE — Telephone Encounter (Signed)
Seen by [facility] 11/17/15. Will print if approved

## 2015-12-09 MED ORDER — TRAMADOL HCL 50 MG PO TABS: ORAL_TABLET | ORAL | Status: DC

## 2015-12-20 ENCOUNTER — Other Ambulatory Visit: Payer: Self-pay | Admitting: Family Medicine

## 2015-12-21 ENCOUNTER — Other Ambulatory Visit: Payer: Self-pay | Admitting: *Deleted

## 2015-12-21 MED ORDER — LEVOCETIRIZINE DIHYDROCHLORIDE 5 MG PO TABS: 5.0000 mg | ORAL_TABLET | Freq: Every day | ORAL | Status: DC

## 2015-12-22 ENCOUNTER — Other Ambulatory Visit: Payer: Self-pay | Admitting: Family Medicine

## 2015-12-23 ENCOUNTER — Telehealth: Payer: Self-pay | Admitting: Family Medicine

## 2015-12-23 NOTE — Telephone Encounter (Signed)
Pt uses [facility]

## 2016-01-07 ENCOUNTER — Other Ambulatory Visit (HOSPITAL_COMMUNITY): Payer: Self-pay | Admitting: Oncology

## 2016-01-09 ENCOUNTER — Other Ambulatory Visit: Payer: Self-pay | Admitting: Family Medicine

## 2016-01-10 ENCOUNTER — Other Ambulatory Visit: Payer: Self-pay | Admitting: *Deleted

## 2016-01-10 ENCOUNTER — Other Ambulatory Visit: Payer: Self-pay | Admitting: Family Medicine

## 2016-01-10 MED ORDER — POTASSIUM CHLORIDE ER 10 MEQ PO TBCR: EXTENDED_RELEASE_TABLET | ORAL | Status: DC

## 2016-02-02 ENCOUNTER — Other Ambulatory Visit: Payer: Self-pay | Admitting: Family Medicine

## 2016-03-07 ENCOUNTER — Ambulatory Visit: Payer: Commercial Managed Care - HMO | Admitting: Family Medicine

## 2016-03-08 ENCOUNTER — Other Ambulatory Visit: Payer: Self-pay | Admitting: Family Medicine

## 2016-03-09 ENCOUNTER — Other Ambulatory Visit: Payer: Self-pay | Admitting: Family Medicine

## 2016-03-09 ENCOUNTER — Encounter: Payer: Self-pay | Admitting: Family Medicine

## 2016-03-26 ENCOUNTER — Other Ambulatory Visit: Payer: Self-pay | Admitting: Family Medicine

## 2016-03-28 ENCOUNTER — Telehealth: Payer: Self-pay | Admitting: Family Medicine

## 2016-03-28 MED ORDER — FUROSEMIDE 40 MG PO TABS: 80.0000 mg | ORAL_TABLET | Freq: Every day | ORAL | Status: DC

## 2016-03-28 NOTE — Telephone Encounter (Signed)
I sent in a refill but needs to be seen both for the swelling and for the referral.  can be put on my schedule for tomorrow.

## 2016-03-28 NOTE — Telephone Encounter (Signed)
Patient aware and states that she does not want to come in a be seen because she knew where she needed to go and didn't have the money to pay two co pays. Patient also stated that her swelling has improved a little. Advised patient to call us back if it go worse. Verbalizing understanding. Just a FYI.

## 2016-03-28 NOTE — Telephone Encounter (Signed)
Covering PCP 

## 2016-04-03 ENCOUNTER — Other Ambulatory Visit: Payer: Self-pay | Admitting: Family Medicine

## 2016-04-03 NOTE — Telephone Encounter (Signed)
Last seen 11/17/15  Dr Livia Snellen

## 2016-04-04 NOTE — Telephone Encounter (Signed)
Last seen 11/17/15  Dr Livia Snellen

## 2016-04-06 ENCOUNTER — Telehealth: Payer: Self-pay | Admitting: Pharmacist

## 2016-04-06 ENCOUNTER — Ambulatory Visit (INDEPENDENT_AMBULATORY_CARE_PROVIDER_SITE_OTHER): Payer: Commercial Managed Care - HMO | Admitting: Family Medicine

## 2016-04-06 ENCOUNTER — Telehealth: Payer: Self-pay | Admitting: Family Medicine

## 2016-04-06 ENCOUNTER — Ambulatory Visit: Payer: Commercial Managed Care - HMO | Admitting: Family Medicine

## 2016-04-06 ENCOUNTER — Encounter: Payer: Self-pay | Admitting: Family Medicine

## 2016-04-06 ENCOUNTER — Other Ambulatory Visit: Payer: Self-pay | Admitting: Pharmacist

## 2016-04-06 VITALS — BP 126/66 | HR 97 | Temp 97.4°F | Wt 168.6 lb

## 2016-04-06 DIAGNOSIS — J439 Emphysema, unspecified: Secondary | ICD-10-CM | POA: Diagnosis not present

## 2016-04-06 DIAGNOSIS — I482 Chronic atrial fibrillation, unspecified: Secondary | ICD-10-CM

## 2016-04-06 DIAGNOSIS — G4700 Insomnia, unspecified: Secondary | ICD-10-CM

## 2016-04-06 DIAGNOSIS — I10 Essential (primary) hypertension: Secondary | ICD-10-CM | POA: Diagnosis not present

## 2016-04-06 MED ORDER — VERAPAMIL HCL ER 180 MG PO TBCR: 180.0000 mg | EXTENDED_RELEASE_TABLET | Freq: Every day | ORAL | Status: DC

## 2016-04-06 MED ORDER — COUMADIN 5 MG PO TABS: 5.0000 mg | ORAL_TABLET | Freq: Every day | ORAL | Status: DC

## 2016-04-06 NOTE — Progress Notes (Signed)
Subjective:  Patient ID: [name] Walker, female    DOB: 07-Apr-1925  Age: 82 y.o. MRN: [id number]  CC: Hypertension and pulmonary emphysema   HPI [name] Walker presents for  follow-up of hypertension. Patient has no history of headache chest pain or shortness of breath or recent cough. Patient also denies symptoms of TIA such as numbness weakness lateralizing. Patient checks  blood pressure at home and has not had any elevated readings recently. Patient States she is having side effects from diltiazem it makes her feel crazy because she can't get any sleep at night.. States taking it regularly.  Patient is using oxygen at nighttime in general. Most of the time through the day. However she did not wear her oxygen to the office today. It's too hard for her to manage that with her walker. Additionally she states that she can no longer afford the Eliquis and would like to discontinue that. She wants to go back to using Coumadin. At the end of the visit she states that she has always had a female doctor in the past and would like to switch to Ms. Evelina Dun to do a physical on her. She has some Eliquis left and she will continue that until she sees Ms. Hawks and Ms. TammiyEckard   Patient in for follow-up of atrial fibrillation. Patient denies any recent bouts of chest pain or palpitations. Additionally, patient is taking anticoagulants. Patient denies any recent excessive bleeding episodes including epistaxis, bleeding from the gums, genitalia, rectal bleeding or hematuria. Additionally there has been no excessive bruising.  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    She has past surgical history that includes Total hip arthroplasty; Knee Arthroplasty; Esophagogastroduodenoscopy (10/17/2007); Colonoscopy; Back surgery; Abdominal hysterectomy; Cholecystectomy; Cataracts; and Parotidectomy (Left, 05/17/2015).   Her family history includes Cancer in her sister; Rheum arthritis in her mother; Ulcers in her father and mother.She reports that she has never smoked. She has never used smokeless tobacco. She reports that she does not drink alcohol or use illicit drugs.  Current Outpatient Prescriptions on File Prior to Visit  Medication Sig Dispense Refill  . albuterol (PROVENTIL) (2.5 MG/3ML) 0.083% nebulizer solution Take 3 mLs (2.5 mg total) by nebulization every 2 (two) hours as needed for wheezing or shortness of breath. 75 mL 12  . amitriptyline (ELAVIL) 75 MG tablet TAKE 1 TABLETS AT BEDTIME 30 tablet 0  . apixaban (ELIQUIS) 5 MG TABS tablet Take 1 tablet (5 mg total) by mouth 2 (two) times daily. 180 tablet 1  . calcium-vitamin D 250-100 MG-UNIT per tablet Take 2 tablets by mouth daily.    . Cholecalciferol (VITAMIN D3) 2000 UNITS CHEW Chew 1 tablet by mouth daily.    . citalopram (CELEXA) 20 MG tablet TAKE 1 TABLET DAILY 30 tablet 0  . donepezil (ARICEPT) 10 MG tablet Take 1 tablet (10 mg total) by mouth at bedtime. 90 tablet 1  . furosemide (LASIX) 40 MG tablet Take 2 tablets (80 mg total) by mouth daily. 60 tablet 0  . gabapentin (NEURONTIN) 300 MG capsule TAKE (1) CAPSULE TWICE DAILY. 60 capsule 0  . hydrOXYzine (ATARAX/VISTARIL) 10 MG tablet TAKE (1) TABLET THREE TIMES DAILY. 90 tablet 0  . levocetirizine (XYZAL) 5 MG tablet Take 1 tablet (5  mg total) by mouth at bedtime. 90 tablet 2  . nortriptyline (PAMELOR) 75 MG capsule Take 1 capsule (75 mg total) by mouth at bedtime. 90 capsule 0  . omeprazole (PRILOSEC) 20 MG capsule TAKE 2 CAPSULES DAILY AS NEEDED FOR REFLUX 60 capsule 3  . potassium chloride (K-DUR) 10 MEQ tablet TAKE 4 TABLETS ONCE DAILY 120  tablet 2  . rOPINIRole (REQUIP) 1 MG tablet TAKE 1 OR 2 TABLETS BY MOUTH AT BEDTIME 56 tablet 3  . traMADol (ULTRAM) 50 MG tablet TAKE (1) TABLET DAILY AS NEEDED. 30 tablet 0  . traZODone (DESYREL) 50 MG tablet Take 1 tablet (50 mg total) by mouth at bedtime. For sleep 30 tablet 2  . triamcinolone cream (KENALOG) 0.1 % Apply 1 application topically daily. 30 g 1   No current facility-administered medications on file prior to visit.    ROS Review of Systems  Constitutional: Negative for fever, activity change and appetite change.  HENT: Negative for congestion, rhinorrhea and sore throat.   Eyes: Negative for visual disturbance.  Respiratory: Negative for cough and shortness of breath.   Cardiovascular: Negative for chest pain and palpitations.  Gastrointestinal: Negative for nausea, abdominal pain and diarrhea.  Genitourinary: Negative for dysuria.  Musculoskeletal: Negative for myalgias and arthralgias.    Objective:  BP 126/66 mmHg  Pulse 97  Temp(Src) 97.4 F (36.3 C) (Oral)  Wt 168 lb 9.6 oz (76.476 kg)  SpO2 87%  BP Readings from Last 3 Encounters:  04/06/16 126/66  11/17/15 126/88  08/29/15 125/66    Wt Readings from Last 3 Encounters:  04/06/16 168 lb 9.6 oz (76.476 kg)  11/17/15 164 lb (74.39 kg)  08/29/15 175 lb (79.379 kg)     Physical Exam  Constitutional: She is oriented to person, place, and time. She appears well-developed and well-nourished. No distress.  HENT:  Head: Normocephalic and atraumatic.  Right Ear: External ear normal.  Left Ear: External ear normal.  Nose: Nose normal.  Mouth/Throat: Oropharynx is clear and moist.  Eyes: Conjunctivae and EOM are normal. Pupils are equal, round, and reactive to light.  Neck: Normal range of motion. Neck supple. No thyromegaly present.  Cardiovascular: Normal rate, regular rhythm and normal heart sounds.   No murmur heard. Pulmonary/Chest: Effort normal and breath sounds normal. No respiratory distress.  She has no wheezes. She has no rales.  Abdominal: Soft. Bowel sounds are normal. She exhibits no distension. There is no tenderness.  Lymphadenopathy:    She has no cervical adenopathy.  Neurological: She is alert and oriented to person, place, and time. She has normal reflexes.  Skin: Skin is warm and dry.  Psychiatric: She has a normal mood and affect. Her behavior is normal. Judgment and thought content normal.     Lab Results  Component Value Date   WBC 6.3 11/17/2015   HGB 11.0* 08/29/2015   HCT 34.5 11/17/2015   PLT 251 11/17/2015   GLUCOSE 84 11/17/2015   CHOL 180 05/13/2015   TRIG 161* 05/13/2015   HDL 62 05/13/2015   LDLCALC 86 05/13/2015   ALT 15 11/17/2015   AST 24 11/17/2015   NA 143 11/17/2015   K 3.9 11/17/2015   CL 101 11/17/2015   CREATININE 1.07* 11/17/2015   BUN 13 11/17/2015   CO2 27 11/17/2015   TSH 1.870 05/13/2015   INR 1.6 06/14/2014    Dg Chest 2 View  07/28/2015  CLINICAL DATA:  Shortness of breath with cough and congestion EXAM: CHEST  2 VIEW COMPARISON:  July 13, 2015 FINDINGS: There is patchy airspace opacity in the posterior left base. There is mild scarring in both lower lobe regions as well. Heart is enlarged with pulmonary vascular within normal limits. No adenopathy. No bone lesions. There is degenerative change in the thoracic spine. Is atherosclerotic calcification in the aorta. IMPRESSION: Patchy airspace opacity posterior left base, most likely pneumonia. Mild scarring noted in both lower lobes. Mild cardiomegaly. Atherosclerotic calcification in aorta. Followup PA and lateral chest radiographs recommended in 3-4 weeks following trial of antibiotic therapy to ensure resolution and exclude underlying malignancy. Electronically Signed   By: Lowella Grip III M.D.   On: 07/28/2015 10:09    Assessment & Plan:   [name] Walker was seen today for hypertension and pulmonary emphysema.  Diagnoses and all orders for this visit:  Pulmonary  emphysema, unspecified emphysema type ([place])  Atrial fibrillation, chronic (HCC)  Essential hypertension  Insomnia  Other orders -     verapamil (CALAN-SR) 180 MG CR tablet; Take 1 tablet (180 mg total) by mouth at bedtime.   I have discontinued Ms. Tant's diltiazem and diltiazem. I am also having her start on verapamil. Additionally, I am having her maintain her triamcinolone cream, Vitamin D3, calcium-vitamin D, albuterol, nortriptyline, traZODone, donepezil, apixaban, traMADol, levocetirizine, rOPINIRole, potassium chloride, omeprazole, furosemide, amitriptyline, hydrOXYzine, gabapentin, and citalopram.  Meds ordered this encounter  Medications  . verapamil (CALAN-SR) 180 MG CR tablet    Sig: Take 1 tablet (180 mg total) by mouth at bedtime.    Dispense:  30 tablet    Refill:  2    Arrange appointment with Tammy Eckerd to transition her to Coumadin from her Eliquis.  I will arrange for you to see Ms. Evelina Dun in the future. Her first appointment with you will be set up as a complete physical as you requested.  In order to save money at your request I will arrange for you to see Ms. Tammy Eckerd who will help you with the transition from Eliquis to Coumadin  I have arranged for a substitute for the green capsule called diltiazem that should help you with your blood pressure without a problem you mentioned from it.    Claretta Fraise, M.D.

## 2016-04-06 NOTE — Telephone Encounter (Signed)
Patient decided to come today.

## 2016-04-06 NOTE — Telephone Encounter (Signed)
Per Dr Livia Snellen patient would like to change from Eliquis back to warfarin.  Tried to call patient to make appt.  No answer on cell number.  She is also due sub AWV after 05/12/16.

## 2016-04-06 NOTE — Patient Instructions (Signed)
I will arrange for you to see Ms. [name] Walker in the future. Her first appointment with you will be set up as a complete physical as you requested.  In order to save money at your request I will arrange for you to see Ms. [name] Walker who will help you with the transition from Eliquis to Coumadin  I have arranged for a substitute for the green capsule called diltiazem that should help you with your blood pressure without a problem you mentioned from it.

## 2016-04-09 ENCOUNTER — Encounter: Payer: Self-pay | Admitting: Family Medicine

## 2016-04-16 ENCOUNTER — Other Ambulatory Visit: Payer: Self-pay | Admitting: Family Medicine

## 2016-04-25 ENCOUNTER — Other Ambulatory Visit: Payer: Self-pay | Admitting: Pediatrics

## 2016-04-26 ENCOUNTER — Ambulatory Visit (INDEPENDENT_AMBULATORY_CARE_PROVIDER_SITE_OTHER): Payer: Medicare Other | Admitting: Otolaryngology

## 2016-05-02 ENCOUNTER — Other Ambulatory Visit: Payer: Self-pay | Admitting: Family Medicine

## 2016-05-07 ENCOUNTER — Other Ambulatory Visit: Payer: Self-pay | Admitting: Family Medicine

## 2016-05-07 ENCOUNTER — Encounter: Payer: Self-pay | Admitting: Pharmacist

## 2016-05-09 ENCOUNTER — Ambulatory Visit (INDEPENDENT_AMBULATORY_CARE_PROVIDER_SITE_OTHER): Payer: Commercial Managed Care - HMO | Admitting: Pharmacist

## 2016-05-09 DIAGNOSIS — Z7901 Long term (current) use of anticoagulants: Secondary | ICD-10-CM

## 2016-05-09 DIAGNOSIS — Z86718 Personal history of other venous thrombosis and embolism: Secondary | ICD-10-CM | POA: Diagnosis not present

## 2016-05-09 DIAGNOSIS — I482 Chronic atrial fibrillation, unspecified: Secondary | ICD-10-CM

## 2016-05-09 LAB — COAGUCHEK XS/INR WAIVED
INR: 2.4 — AB (ref 0.9–1.1)
PROTHROMBIN TIME: 28.3 s

## 2016-05-15 ENCOUNTER — Other Ambulatory Visit: Payer: Self-pay | Admitting: *Deleted

## 2016-05-15 MED ORDER — TRIAMCINOLONE ACETONIDE 0.1 % EX CREA: TOPICAL_CREAM | CUTANEOUS | 1 refills | Status: DC

## 2016-05-17 ENCOUNTER — Encounter: Payer: Self-pay | Admitting: Pharmacist

## 2016-05-21 ENCOUNTER — Telehealth: Payer: Self-pay

## 2016-05-21 ENCOUNTER — Other Ambulatory Visit: Payer: Self-pay | Admitting: *Deleted

## 2016-05-21 ENCOUNTER — Encounter: Payer: Self-pay | Admitting: Family Medicine

## 2016-05-21 DIAGNOSIS — M8580 Other specified disorders of bone density and structure, unspecified site: Secondary | ICD-10-CM

## 2016-05-21 DIAGNOSIS — S12400A Unspecified displaced fracture of fifth cervical vertebra, initial encounter for closed fracture: Secondary | ICD-10-CM

## 2016-05-21 NOTE — Telephone Encounter (Signed)
Okay to refer. WS

## 2016-05-22 ENCOUNTER — Other Ambulatory Visit: Payer: Self-pay

## 2016-05-22 ENCOUNTER — Ambulatory Visit (INDEPENDENT_AMBULATORY_CARE_PROVIDER_SITE_OTHER): Payer: Commercial Managed Care - HMO | Admitting: Nurse Practitioner

## 2016-05-22 ENCOUNTER — Emergency Department (HOSPITAL_COMMUNITY)
Admission: EM | Admit: 2016-05-22 | Discharge: 2016-05-22 | Disposition: A | Discharge status: Home / Self Care | Payer: Commercial Managed Care - HMO | Source: Home / Self Care | Attending: Emergency Medicine | Admitting: Emergency Medicine

## 2016-05-22 ENCOUNTER — Encounter: Payer: Self-pay | Admitting: Nurse Practitioner

## 2016-05-22 ENCOUNTER — Ambulatory Visit (INDEPENDENT_AMBULATORY_CARE_PROVIDER_SITE_OTHER): Payer: Commercial Managed Care - HMO | Admitting: Pharmacist

## 2016-05-22 ENCOUNTER — Emergency Department (HOSPITAL_COMMUNITY): Payer: Commercial Managed Care - HMO

## 2016-05-22 ENCOUNTER — Encounter (HOSPITAL_COMMUNITY): Payer: Self-pay

## 2016-05-22 VITALS — BP 125/62 | HR 90 | Temp 96.9°F | Ht <= 58 in | Wt 174.0 lb

## 2016-05-22 DIAGNOSIS — L03115 Cellulitis of right lower limb: Secondary | ICD-10-CM

## 2016-05-22 DIAGNOSIS — L97929 Non-pressure chronic ulcer of unspecified part of left lower leg with unspecified severity: Principal | ICD-10-CM

## 2016-05-22 DIAGNOSIS — Z86718 Personal history of other venous thrombosis and embolism: Secondary | ICD-10-CM | POA: Diagnosis not present

## 2016-05-22 DIAGNOSIS — B999 Unspecified infectious disease: Secondary | ICD-10-CM

## 2016-05-22 DIAGNOSIS — I482 Chronic atrial fibrillation, unspecified: Secondary | ICD-10-CM

## 2016-05-22 DIAGNOSIS — A419 Sepsis, unspecified organism: Secondary | ICD-10-CM | POA: Diagnosis not present

## 2016-05-22 DIAGNOSIS — Z7901 Long term (current) use of anticoagulants: Secondary | ICD-10-CM

## 2016-05-22 DIAGNOSIS — J449 Chronic obstructive pulmonary disease, unspecified: Secondary | ICD-10-CM

## 2016-05-22 DIAGNOSIS — I959 Hypotension, unspecified: Secondary | ICD-10-CM | POA: Diagnosis not present

## 2016-05-22 DIAGNOSIS — I83019 Varicose veins of right lower extremity with ulcer of unspecified site: Secondary | ICD-10-CM

## 2016-05-22 DIAGNOSIS — I10 Essential (primary) hypertension: Secondary | ICD-10-CM | POA: Insufficient documentation

## 2016-05-22 DIAGNOSIS — IMO0001 Reserved for inherently not codable concepts without codable children: Secondary | ICD-10-CM

## 2016-05-22 DIAGNOSIS — L97919 Non-pressure chronic ulcer of unspecified part of right lower leg with unspecified severity: Secondary | ICD-10-CM

## 2016-05-22 DIAGNOSIS — Z79899 Other long term (current) drug therapy: Secondary | ICD-10-CM | POA: Insufficient documentation

## 2016-05-22 LAB — COAGUCHEK XS/INR WAIVED
INR: 5.3 — AB (ref 0.9–1.1)
Prothrombin Time: 63.4 s

## 2016-05-22 LAB — CBC WITH DIFFERENTIAL/PLATELET
BASOS PCT: 0 %
Basophils Absolute: 0 10*3/uL (ref 0.0–0.1)
EOS ABS: 0.2 10*3/uL (ref 0.0–0.7)
Eosinophils Relative: 1 %
HEMATOCRIT: 31.8 % — AB (ref 36.0–46.0)
Hemoglobin: 10.6 g/dL — ABNORMAL LOW (ref 12.0–15.0)
LYMPHS ABS: 1.4 10*3/uL (ref 0.7–4.0)
Lymphocytes Relative: 9 %
MCH: 31.2 pg (ref 26.0–34.0)
MCHC: 33.3 g/dL (ref 30.0–36.0)
MCV: 93.5 fL (ref 78.0–100.0)
MONO ABS: 1.5 10*3/uL — AB (ref 0.1–1.0)
MONOS PCT: 10 %
Neutro Abs: 11.9 10*3/uL — ABNORMAL HIGH (ref 1.7–7.7)
Neutrophils Relative %: 80 %
Platelets: 203 10*3/uL (ref 150–400)
RBC: 3.4 MIL/uL — ABNORMAL LOW (ref 3.87–5.11)
RDW: 13.8 % (ref 11.5–15.5)
WBC: 15 10*3/uL — ABNORMAL HIGH (ref 4.0–10.5)

## 2016-05-22 LAB — BASIC METABOLIC PANEL
Anion gap: 10 (ref 5–15)
BUN: 21 mg/dL — AB (ref 6–20)
CALCIUM: 9.1 mg/dL (ref 8.9–10.3)
CO2: 28 mmol/L (ref 22–32)
CREATININE: 1.16 mg/dL — AB (ref 0.44–1.00)
Chloride: 100 mmol/L — ABNORMAL LOW (ref 101–111)
GFR calc non Af Amer: 41 mL/min — ABNORMAL LOW (ref >60)
GFR, EST AFRICAN AMERICAN: 47 mL/min — AB (ref >60)
Glucose, Bld: 93 mg/dL (ref 65–99)
Potassium: 2.9 mmol/L — ABNORMAL LOW (ref 3.5–5.1)
SODIUM: 138 mmol/L (ref 135–145)

## 2016-05-22 MED ORDER — SULFAMETHOXAZOLE-TRIMETHOPRIM 800-160 MG PO TABS: 2.0000 | ORAL_TABLET | Freq: Two times a day (BID) | ORAL | 0 refills | Status: DC

## 2016-05-22 MED ORDER — ONDANSETRON HCL 4 MG/2ML IJ SOLN
4.0000 mg | Freq: Once | INTRAMUSCULAR | Status: AC
  Administered 2016-05-22: 4 mg via INTRAVENOUS
  Filled 2016-05-22: qty 2

## 2016-05-22 MED ORDER — TRAMADOL HCL 50 MG PO TABS: 50.0000 mg | ORAL_TABLET | Freq: Four times a day (QID) | ORAL | 0 refills | Status: DC | PRN

## 2016-05-22 MED ORDER — MORPHINE SULFATE (PF) 4 MG/ML IV SOLN
4.0000 mg | INTRAVENOUS | Status: DC | PRN
  Administered 2016-05-22 (×2): 4 mg via INTRAVENOUS
  Filled 2016-05-22 (×2): qty 1

## 2016-05-22 MED ORDER — WARFARIN SODIUM 2.5 MG PO TABS: 2.5000 mg | ORAL_TABLET | Freq: Every day | ORAL | 1 refills | Status: DC

## 2016-05-22 MED ORDER — VANCOMYCIN HCL IN DEXTROSE 1-5 GM/200ML-% IV SOLN
1000.0000 mg | Freq: Once | INTRAVENOUS | Status: AC
  Administered 2016-05-22: 1000 mg via INTRAVENOUS
  Filled 2016-05-22: qty 200

## 2016-05-22 NOTE — ED Notes (Signed)
Applied wet to dry dressing to wound on right foot and applied ace bandage.

## 2016-05-22 NOTE — ED Provider Notes (Signed)
[facility] DEPT Provider Note   CSN: [id number] Arrival date & time: 05/22/16  1715     History   Chief Complaint Chief Complaint  Patient presents with  . Wound Infection    HPI [name] Walker is a 82 y.o. female.  [name] Walker presents with complaint of right foot pain. [name] Walker's had some redness of her right foot first 2-3 weeks. States [name] Walker was seen by physician and given 5 days and antibiotic and it "didn't go away".  [name] Walker states that [name] Walker had an ultrasound that "that another place" and that [name] Walker was told it was not a blood clot. No fevers or chills. Redness and pain in her right foot. No fall or injury.  HPI  Past Medical History:  Diagnosis Date  . Cataract   . COPD (chronic obstructive pulmonary disease) ([place])   . Depression   . DVT (deep venous thrombosis) ([place])   . Gastric erosion   . GERD (gastroesophageal reflux disease)   . Hypertension   . Insomnia   . Ischemic colitis ([place])   . Osteoarthritis   . Parotid mass 2016  . 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 B12 deficiency 08/24/2015  . Insomnia 08/15/2015  . 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 Atrial fibrillation, chronic ([place]) 07/22/2015  . H/O parotidectomy 05/17/2015  . Osteopenia 05/13/2015  . Weakness 07/15/2013  . ANEMIA, NORMOCYTIC 01/06/2009  . DEPRESSION 01/06/2009  . Essential hypertension 01/06/2009  . COPD (chronic obstructive pulmonary disease) ([place]) 01/06/2009  . GERD 01/06/2009  . IRRITABLE BOWEL SYNDROME 01/06/2009    Past Surgical History:  Procedure Laterality Date  . ABDOMINAL HYSTERECTOMY    . BACK SURGERY    . Cataracts    . CHOLECYSTECTOMY    . COLONOSCOPY    . ESOPHAGOGASTRODUODENOSCOPY  10/17/2007   erosion   . KNEE ARTHROPLASTY     bilateral  . PAROTIDECTOMY Left 05/17/2015   Procedure: PAROTIDECTOMY- LEFT TOTAL;  Surgeon: Leta Baptist, MD;  Location: [facility];  Service: ENT;  Laterality: Left;  . TOTAL HIP ARTHROPLASTY     bilateral    OB History    No data available       Home Medications    Prior to Admission medications   Medication Sig Start Date End Date Taking? Authorizing Provider  albuterol (PROVENTIL) (2.5 MG/3ML) 0.083% nebulizer solution Take 3 mLs (2.5 mg total) by nebulization every 2 (two) hours as needed for wheezing or shortness of breath. 07/31/15  Yes Erline Hau, MD  amitriptyline (ELAVIL) 75 MG tablet TAKE 1 TABLETS AT BEDTIME 05/02/16  Yes Claretta Fraise, MD  Calcium Carbonate-Vitamin D (CALCIUM-VITAMIN D) 500-200 MG-UNIT tablet Take 1 tablet by mouth every morning.   Yes Historical Provider, MD  citalopram (CELEXA) 20 MG tablet TAKE 1 TABLET DAILY Patient taking differently: TAKE 1 TABLET DAILY IN THE MORNING 04/04/16  Yes Claretta Fraise, MD  donepezil (ARICEPT) 10 MG tablet TAKE ONE TABLET AT BEDTIME 04/16/16  Yes Claretta Fraise, MD  furosemide (LASIX) 40 MG tablet TAKE (2) TABLETS DAILY Patient taking differently: TAKE ONE TABLET BY MOUTH TWICE DAILY 04/26/16  Yes Claretta Fraise, MD  gabapentin (NEURONTIN) 300 MG capsule TAKE (1) CAPSULE TWICE DAILY. 05/02/16  Yes Claretta Fraise, MD  hydrOXYzine (ATARAX/VISTARIL) 10 MG tablet TAKE (1) TABLET THREE TIMES DAILY. 05/08/16  Yes Claretta Fraise, MD  levocetirizine Harlow Ohms) 5  MG tablet Take 1 tablet (5 mg total) by mouth at bedtime. 12/21/15  Yes Claretta Fraise, MD  omeprazole (PRILOSEC) 20 MG capsule TAKE 2 CAPSULES DAILY AS NEEDED FOR REFLUX Patient taking differently: TAKE 2 CAPSULES DAILY IN THE MORNING 01/10/16  Yes Claretta Fraise, MD  OXYGEN Inhale into the lungs at bedtime.   Yes Historical Provider, MD  potassium chloride (K-DUR) 10 MEQ tablet TAKE 4 TABLETS ONCE DAILY Patient taking differently: TAKE 4 TABLETS ONCE DAILY IN THE MORNING 05/08/16  Yes Claretta Fraise, MD  rOPINIRole (REQUIP) 1 MG tablet TAKE 1 OR 2 TABLETS BY MOUTH AT BEDTIME 12/22/15  Yes Chipper Herb, MD  triamcinolone cream (KENALOG) 0.1 % Apply 1 application topically daily. 05/15/16  Yes Claretta Fraise, MD  verapamil (CALAN-SR) 180 MG CR tablet Take 1 tablet (180 mg total) by mouth at bedtime. 04/06/16  Yes Claretta Fraise, MD  Vitamin D-Vitamin K (VITAMIN K2-VITAMIN D3 PO) Take 2 capsules by mouth every morning.   Yes Historical Provider, MD  warfarin (COUMADIN) 2.5 MG tablet Take 1 tablet (2.5 mg total) by mouth daily. Patient taking differently: Take 2.5-5 mg by mouth daily. Take one-half tablets on MWF, and take one tablet on all other days. 05/22/16  Yes Cherre Robins, PharmD  sulfamethoxazole-trimethoprim (BACTRIM DS,SEPTRA DS) 800-160 MG tablet Take 2 tablets by mouth 2 (two) times daily. 05/22/16   Tanna Furry, MD  traMADol (ULTRAM) 50 MG tablet Take 1 tablet (50 mg total) by mouth every 6 (six) hours as needed. 05/22/16   Tanna Furry, MD    Family History Family History  Problem Relation Age of Onset  . Rheum arthritis Mother   . Ulcers Mother     on foot  . Ulcers Father   . Cancer Sister     uterine    Social History Social History  Substance Use Topics  . Smoking status: Never Smoker  . Smokeless tobacco: Never Used  . Alcohol use No     Allergies   Diltiazem; Iohexol; Latex; Penicillins; and Shellfish allergy   Review of Systems Review of Systems  Constitutional: Negative for appetite change, chills, diaphoresis, fatigue and fever.  HENT: Negative for mouth sores, sore throat and trouble swallowing.   Eyes: Negative for visual disturbance.  Respiratory: Negative for cough, chest tightness, shortness of breath and wheezing.   Cardiovascular: Negative for chest pain.  Gastrointestinal: Negative for abdominal distention, abdominal pain, diarrhea, nausea and vomiting.  Endocrine: Negative for polydipsia, polyphagia and polyuria.  Genitourinary: Negative for dysuria, frequency and hematuria.  Musculoskeletal: Negative for gait problem.       Right foot pain.  Redness and erythema at the ankle. No proximal striations.  Skin: Negative for color change, pallor and rash.  Neurological: Negative for dizziness, syncope, light-headedness and headaches.  Hematological: Does not bruise/bleed easily.  Psychiatric/Behavioral: Negative for behavioral problems and confusion.     Physical Exam Updated Vital Signs BP 139/77 (BP Location: Left Arm)   Pulse 88   Temp 97.9 F (36.6 C) (Oral)   Resp 16   Ht 4\' 9"  (1.448 m)   Wt 170 lb (77.1 kg)   SpO2 94%   BMI 36.79 kg/m   Physical Exam  Constitutional: [name] Walker is oriented to person, place, and time. [name] Walker appears well-developed and well-nourished. No distress.  HENT:  Head: Normocephalic.  Eyes: Conjunctivae are normal. Pupils are equal, round, and reactive to light. No scleral icterus.  Neck: Normal range of motion. Neck supple. No thyromegaly present.  Cardiovascular: Normal rate and regular rhythm.  Exam reveals no gallop and no friction rub.   No murmur heard. Pulmonary/Chest: Effort normal and breath sounds normal. No respiratory distress. [name] Walker has no wheezes. [name] Walker has no rales.  Abdominal: Soft. Bowel sounds are normal. [name] Walker exhibits no distension. There is no tenderness. There is no rebound.  Musculoskeletal: Normal range of motion.  Erythema about the right ankle. Does not appear consistent with gout. No pain with movement of the ankle. Pain to palpation. No crepitus. Well-perfused. One made by 4 cm medial ulcer.  Neurological: [name] Walker is alert and oriented to person, place, and time.  Skin: Skin is warm and dry. No rash noted.  Psychiatric: [name] Walker has a normal mood and affect. Her behavior is normal.     ED Treatments / Results  Labs (all labs ordered are listed, but only abnormal results are displayed) Labs Reviewed  CBC WITH DIFFERENTIAL/PLATELET - Abnormal; Notable for the following:       Result Value   WBC 15.0 (*)    RBC 3.40 (*)    Hemoglobin 10.6 (*)    HCT 31.8 (*)    Neutro Abs 11.9 (*)     Monocytes Absolute 1.5 (*)    All other components within normal limits  BASIC METABOLIC PANEL - Abnormal; Notable for the following:    Potassium 2.9 (*)    Chloride 100 (*)    BUN 21 (*)    Creatinine, Ser 1.16 (*)    GFR calc non Af Amer 41 (*)    GFR calc Af Amer 47 (*)    All other components within normal limits    EKG  EKG Interpretation None       Radiology Dg Ankle Complete Right  Result Date: 05/22/2016 CLINICAL DATA:  RIGHT ankle foot and drainage for 1 month. Medial ankle ulcer. EXAM: RIGHT ANKLE - COMPLETE 3+ VIEW COMPARISON:  RIGHT ankle radiograph March 10, 2014 FINDINGS: There is no evidence of fracture, dislocation, or joint effusion. There is no evidence of arthropathy or other focal bone abnormality. Moderate medial ankle soft tissue swelling though, decreased from prior examination. Focal median ankle subcutaneous gas most compatible with ulcer. No radiopaque foreign bodies. IMPRESSION: Medial ankle soft tissue swelling and ulceration. No acute osseous process. Electronically Signed   By: Elon Alas M.D.   On: 05/22/2016 19:06   Dg Foot Complete Right  Result Date: 05/22/2016 CLINICAL DATA:  Right foot and ankle pain. Drainage for over a month from the medial ankle. EXAM: RIGHT FOOT COMPLETE - 3+ VIEW COMPARISON:  None. FINDINGS: Bony demineralization. Small marginal articular erosion along the base of the middle phalanx small toe. Lisfranc joint alignment normal. No bony destructive findings are identified. Thickened distal Achilles tendon is suggested, with Achilles calcaneal spurring and a Haglund deformity. Plantar calcaneal spur. Subcutaneous edema along the plantar heel. IMPRESSION: 1. Bony demineralization. No bony destructive findings identified to suggest osteomyelitis. No gas tracking in the soft tissues. 2. Probable distal Achilles tendinopathy with Achilles calcaneal spur and Haglund deformity. 3. Plantar calcaneal spurring 4. Subcutaneous edema  along the plantar heel. 5. Small marginal erosion in the small toe may reflect low-grade underlying erosive arthropathy . Electronically Signed   By: Van Clines M.D.   On: 05/22/2016 19:03    Procedures Procedures (including critical care time)  Medications Ordered in ED Medications  vancomycin (VANCOCIN) IVPB 1000 mg/200 mL premix (1,000 mg Intravenous New Bag/Given 05/22/16 2020)  morphine 4 MG/ML injection 4 mg (  4 mg Intravenous Given 05/22/16 2020)  ondansetron (ZOFRAN) injection 4 mg (4 mg Intravenous Given 05/22/16 2021)     Initial Impression / Assessment and Plan / ED Course  I have reviewed the triage vital signs and the nursing notes.  Pertinent labs & imaging results that were available during my care of the patient were reviewed by me and considered in my medical decision making (see chart for details).  Clinical Course   Patient does not appear septic toxic. [name] Walker is not febrile. Has no signs of osteo-, or 17 years gas. Has not been treated for more than a week for this infection. [name] Walker is appropriate for a trial of outpatient treatment. Given IV vancomycin here. We'll discharge with 2 Bactrim DS twice a day. Tramadol. I left message and order with case manager for face-to-face home health evaluation for dressing changes. Wound was dressed here. Recheck with primary care within 1 week. ER with worsening.  Final Clinical Impressions(s) / ED Diagnoses   Final diagnoses:  Cellulitis of right foot    New Prescriptions New Prescriptions   SULFAMETHOXAZOLE-TRIMETHOPRIM (BACTRIM DS,SEPTRA DS) 800-160 MG TABLET    Take 2 tablets by mouth 2 (two) times daily.   TRAMADOL (ULTRAM) 50 MG TABLET    Take 1 tablet (50 mg total) by mouth every 6 (six) hours as needed.     Tanna Furry, MD 05/22/16 2103

## 2016-05-22 NOTE — Progress Notes (Signed)
   Subjective:    Patient ID: [name] Walker, female    DOB: Nov 26, 1924, 82 y.o.   MRN: [id number]  HPI Patient here today, she is here by herself. She has a complain of right lower extremity swelling, pain, and weeping. The current problem as been going on for about over a month per patient report.She said the wound came on suddenly, she denied any trauma to her left leg. She is concerned that the wound as having a lot of drainage.  Patient report going to the [facility] Emergency room twice two weeks ago for the same complain.  She said she was suppose to follow-up with a provider in [place], but has not been able to get an appointment with him.   Review of Systems  Constitutional: Negative for appetite change, chills, fatigue and fever.  Respiratory: Negative.   Cardiovascular: Positive for leg swelling (bilateral legs). Negative for chest pain.  Musculoskeletal: Positive for arthralgias.  Hematological: Bruises/bleeds easily.       Objective:   Physical Exam  Constitutional: She appears well-developed and well-nourished.  Cardiovascular: Normal rate.   Irregular rhythm  Pulmonary/Chest: Effort normal and breath sounds normal.  Skin: Skin is warm and dry.  Open wound on medial aspect of right ankle (3cmx 1.5cm). Bilateral lower extremities swelling and erythema noted. Copious drainage of serous secretion noted from the wound.   Psychiatric: She has a normal mood and affect. Her behavior is normal. Thought content normal.  BP 125/62   Pulse 90   Temp (!) 96.9 F (36.1 C) (Oral)   Ht 4\' 10"  (1.473 m)   Wt 174 lb (78.9 kg)   BMI 36.37 kg/m         Assessment & Plan:   1. Venous stasis ulcer, right [facility])    Patient family to take her to  ER- report called to [facility]- triage nurse. May need wound care as well as IV antibiotics RTO prn  Mary-Margaret Hassell Done, FNP'

## 2016-05-22 NOTE — Patient Instructions (Signed)
Venous Ulcer A venous ulcer is a shallow sore on your lower leg. It is caused by poor circulation in your veins. Venous ulcer is the most common type of lower leg ulcer. You may have venous ulcers on one leg or on both legs. This condition most often develops around your ankles. This type of ulcer may last for a long time (chronic ulcer) or it may return often (recurrent ulcer). HOME CARE Wound Care  Follow instructions from your doctor about:  How to take care of your wound.  When and how you should change your bandage (dressing).  When you should remove your bandage. If your bandage is dry and gets stuck to your leg when you try to remove it, moisten or wet the bandage with saline solution or water. This helps you to remove it without harming your skin or wound.  Check your wound every day for signs of infection. Have a caregiver do this for you if you are not able to do it yourself. Watch for:  More redness, swelling, or pain.  More fluid or blood.  Pus, warmth, or a bad smell. Medicines  Take over-the-counter and prescription medicines only as told by your doctor.  If you were prescribed an antibiotic medicine, take it or apply it as told by your doctor. Do not stop taking or using the antibiotic even if your condition improves. Activity  Do not stand or sit in one position for a long period of time. Rest with your legs raised during the day. If possible, keep your legs above your heart for 30 minutes, 3-4 times a day, or as told by your doctor.  Do not sit with your legs crossed.  Walk often to increase the blood flow in your legs. Ask your doctor what level of activity is safe for you.  If you are taking a long ride in a car or plane, take a break to walk around at least once every two hours, or as told by your doctor. Ask your doctor if you should take aspirin before long trips. General Instructions  Wear elastic stockings, compression stockings, or support hose as told  by your doctor. This is very important.  Raise the foot of your bed as told by your doctor.  Do not smoke.  Keep all follow-up visits as told by your doctor. This is important. GET HELP IF:  You have a fever.  Your ulcer is getting larger or is not healing.  Your pain gets worse.  You have more redness or swelling around your ulcer.  You have more fluid, blood, or pus coming from your ulcer after it has been cleaned by you or your doctor.  You have warmth or a bad smell coming from your ulcer.   This information is not intended to replace advice given to you by your health care provider. Make sure you discuss any questions you have with your health care provider.   Document Released: 10/18/2004 Document Revised: 06/01/2015 Document Reviewed: 01/19/2015 Elsevier Interactive Patient Education [employer].

## 2016-05-22 NOTE — ED Triage Notes (Signed)
Pt sent by [facility] for evaluation of weeping wounds to r foot.

## 2016-05-22 NOTE — Discharge Instructions (Signed)
Our case manager will contact you at home tomorrow. We will attempt to set up a home health care nurse to come perform dressing changes on your foot at home.  Make an appointment to recheck with your physician within the next 7 days.  Return to emergency room if you are getting worse with fever, spreading redness, or other new or worsening symptoms.

## 2016-05-23 ENCOUNTER — Emergency Department (HOSPITAL_COMMUNITY): Payer: Commercial Managed Care - HMO

## 2016-05-23 ENCOUNTER — Encounter (HOSPITAL_COMMUNITY): Payer: Self-pay | Admitting: *Deleted

## 2016-05-23 ENCOUNTER — Inpatient Hospital Stay (HOSPITAL_COMMUNITY)
Admission: EM | Admit: 2016-05-23 | Discharge: 2016-05-25 | DRG: 871 | Disposition: A | Discharge status: Home Health | Payer: Commercial Managed Care - HMO | Attending: Internal Medicine | Admitting: Internal Medicine

## 2016-05-23 DIAGNOSIS — Z66 Do not resuscitate: Secondary | ICD-10-CM | POA: Diagnosis present

## 2016-05-23 DIAGNOSIS — Y92009 Unspecified place in unspecified non-institutional (private) residence as the place of occurrence of the external cause: Secondary | ICD-10-CM

## 2016-05-23 DIAGNOSIS — I959 Hypotension, unspecified: Secondary | ICD-10-CM | POA: Diagnosis present

## 2016-05-23 DIAGNOSIS — Z8261 Family history of arthritis: Secondary | ICD-10-CM | POA: Diagnosis not present

## 2016-05-23 DIAGNOSIS — R0902 Hypoxemia: Secondary | ICD-10-CM

## 2016-05-23 DIAGNOSIS — Z91040 Latex allergy status: Secondary | ICD-10-CM

## 2016-05-23 DIAGNOSIS — K219 Gastro-esophageal reflux disease without esophagitis: Secondary | ICD-10-CM | POA: Diagnosis present

## 2016-05-23 DIAGNOSIS — I482 Chronic atrial fibrillation, unspecified: Secondary | ICD-10-CM | POA: Diagnosis present

## 2016-05-23 DIAGNOSIS — A419 Sepsis, unspecified organism: Secondary | ICD-10-CM | POA: Diagnosis not present

## 2016-05-23 DIAGNOSIS — Z808 Family history of malignant neoplasm of other organs or systems: Secondary | ICD-10-CM | POA: Diagnosis not present

## 2016-05-23 DIAGNOSIS — Z7901 Long term (current) use of anticoagulants: Secondary | ICD-10-CM | POA: Diagnosis not present

## 2016-05-23 DIAGNOSIS — R6521 Severe sepsis with septic shock: Secondary | ICD-10-CM | POA: Diagnosis present

## 2016-05-23 DIAGNOSIS — Z85818 Personal history of malignant neoplasm of other sites of lip, oral cavity, and pharynx: Secondary | ICD-10-CM | POA: Diagnosis not present

## 2016-05-23 DIAGNOSIS — Z96643 Presence of artificial hip joint, bilateral: Secondary | ICD-10-CM | POA: Diagnosis present

## 2016-05-23 DIAGNOSIS — I10 Essential (primary) hypertension: Secondary | ICD-10-CM | POA: Diagnosis present

## 2016-05-23 DIAGNOSIS — J449 Chronic obstructive pulmonary disease, unspecified: Secondary | ICD-10-CM | POA: Diagnosis present

## 2016-05-23 DIAGNOSIS — S12400A Unspecified displaced fracture of fifth cervical vertebra, initial encounter for closed fracture: Secondary | ICD-10-CM | POA: Diagnosis present

## 2016-05-23 DIAGNOSIS — Z91013 Allergy to seafood: Secondary | ICD-10-CM | POA: Diagnosis not present

## 2016-05-23 DIAGNOSIS — L03115 Cellulitis of right lower limb: Secondary | ICD-10-CM | POA: Diagnosis present

## 2016-05-23 DIAGNOSIS — Z880 Allergy status to penicillin: Secondary | ICD-10-CM | POA: Diagnosis not present

## 2016-05-23 DIAGNOSIS — L97319 Non-pressure chronic ulcer of right ankle with unspecified severity: Secondary | ICD-10-CM | POA: Diagnosis present

## 2016-05-23 DIAGNOSIS — L0390 Cellulitis, unspecified: Secondary | ICD-10-CM

## 2016-05-23 DIAGNOSIS — E876 Hypokalemia: Secondary | ICD-10-CM | POA: Diagnosis present

## 2016-05-23 DIAGNOSIS — W19XXXA Unspecified fall, initial encounter: Secondary | ICD-10-CM | POA: Diagnosis present

## 2016-05-23 DIAGNOSIS — N179 Acute kidney failure, unspecified: Secondary | ICD-10-CM

## 2016-05-23 DIAGNOSIS — R55 Syncope and collapse: Secondary | ICD-10-CM

## 2016-05-23 DIAGNOSIS — Z86718 Personal history of other venous thrombosis and embolism: Secondary | ICD-10-CM | POA: Diagnosis not present

## 2016-05-23 LAB — CBC WITH DIFFERENTIAL/PLATELET
BASOS ABS: 0 10*3/uL (ref 0.0–0.1)
BASOS PCT: 0 %
EOS PCT: 2 %
Eosinophils Absolute: 0.3 10*3/uL (ref 0.0–0.7)
HEMATOCRIT: 28.9 % — AB (ref 36.0–46.0)
Hemoglobin: 9.6 g/dL — ABNORMAL LOW (ref 12.0–15.0)
LYMPHS PCT: 7 %
Lymphs Abs: 0.8 10*3/uL (ref 0.7–4.0)
MCH: 31.1 pg (ref 26.0–34.0)
MCHC: 33.2 g/dL (ref 30.0–36.0)
MCV: 93.5 fL (ref 78.0–100.0)
Monocytes Absolute: 1.4 10*3/uL — ABNORMAL HIGH (ref 0.1–1.0)
Monocytes Relative: 11 %
NEUTROS ABS: 9.5 10*3/uL — AB (ref 1.7–7.7)
Neutrophils Relative %: 80 %
PLATELETS: 189 10*3/uL (ref 150–400)
RBC: 3.09 MIL/uL — AB (ref 3.87–5.11)
RDW: 13.7 % (ref 11.5–15.5)
WBC: 12 10*3/uL — AB (ref 4.0–10.5)

## 2016-05-23 LAB — COMPREHENSIVE METABOLIC PANEL
ALBUMIN: 3.1 g/dL — AB (ref 3.5–5.0)
ALK PHOS: 104 U/L (ref 38–126)
ALT: 11 U/L — AB (ref 14–54)
ALT: 16 U/L (ref 14–54)
AST: 23 U/L (ref 15–41)
AST: 32 U/L (ref 15–41)
Albumin: 2.9 g/dL — ABNORMAL LOW (ref 3.5–5.0)
Alkaline Phosphatase: 94 U/L (ref 38–126)
Anion gap: 8 (ref 5–15)
Anion gap: 9 (ref 5–15)
BILIRUBIN TOTAL: 0.8 mg/dL (ref 0.3–1.2)
BUN: 20 mg/dL (ref 6–20)
BUN: 21 mg/dL — AB (ref 6–20)
CALCIUM: 7.8 mg/dL — AB (ref 8.9–10.3)
CHLORIDE: 102 mmol/L (ref 101–111)
CO2: 28 mmol/L (ref 22–32)
CO2: 26 mmol/L (ref 22–32)
CREATININE: 1.19 mg/dL — AB (ref 0.44–1.00)
CREATININE: 1.35 mg/dL — AB (ref 0.44–1.00)
Calcium: 8.5 mg/dL — ABNORMAL LOW (ref 8.9–10.3)
Chloride: 106 mmol/L (ref 101–111)
GFR calc Af Amer: 39 mL/min — ABNORMAL LOW (ref >60)
GFR, EST AFRICAN AMERICAN: 46 mL/min — AB (ref >60)
GFR, EST NON AFRICAN AMERICAN: 34 mL/min — AB (ref >60)
GFR, EST NON AFRICAN AMERICAN: 40 mL/min — AB (ref >60)
GLUCOSE: 132 mg/dL — AB (ref 65–99)
Glucose, Bld: 123 mg/dL — ABNORMAL HIGH (ref 65–99)
POTASSIUM: 3.1 mmol/L — AB (ref 3.5–5.1)
Potassium: 3.4 mmol/L — ABNORMAL LOW (ref 3.5–5.1)
Sodium: 138 mmol/L (ref 135–145)
Sodium: 141 mmol/L (ref 135–145)
Total Bilirubin: 1 mg/dL (ref 0.3–1.2)
Total Protein: 6.3 g/dL — ABNORMAL LOW (ref 6.5–8.1)
Total Protein: 5.8 g/dL — ABNORMAL LOW (ref 6.5–8.1)

## 2016-05-23 LAB — URINALYSIS, ROUTINE W REFLEX MICROSCOPIC
BILIRUBIN URINE: NEGATIVE
Glucose, UA: NEGATIVE mg/dL
Ketones, ur: NEGATIVE mg/dL
LEUKOCYTES UA: NEGATIVE
NITRITE: NEGATIVE
PH: 5.5 (ref 5.0–8.0)
SPECIFIC GRAVITY, URINE: 1.015 (ref 1.005–1.030)

## 2016-05-23 LAB — LACTIC ACID, PLASMA
LACTIC ACID, VENOUS: 0.6 mmol/L (ref 0.5–1.9)
LACTIC ACID, VENOUS: 0.6 mmol/L (ref 0.5–1.9)

## 2016-05-23 LAB — MRSA PCR SCREENING: MRSA BY PCR: POSITIVE — AB

## 2016-05-23 LAB — CBC
HEMATOCRIT: 28.3 % — AB (ref 36.0–46.0)
HEMOGLOBIN: 9.2 g/dL — AB (ref 12.0–15.0)
MCH: 31.5 pg (ref 26.0–34.0)
MCHC: 32.5 g/dL (ref 30.0–36.0)
MCV: 96.9 fL (ref 78.0–100.0)
Platelets: 188 10*3/uL (ref 150–400)
RBC: 2.92 MIL/uL — AB (ref 3.87–5.11)
RDW: 13.5 % (ref 11.5–15.5)
WBC: 11.8 10*3/uL — ABNORMAL HIGH (ref 4.0–10.5)

## 2016-05-23 LAB — TROPONIN I
TROPONIN I: 0.03 ng/mL — AB (ref <0.03)
Troponin I: <0.03 ng/mL (ref <0.03)
Troponin I: <0.03 ng/mL (ref <0.03)
Troponin I: <0.03 ng/mL (ref <0.03)

## 2016-05-23 LAB — PROTIME-INR
INR: 4.33 — AB
PROTHROMBIN TIME: 42.6 s — AB (ref 11.4–15.2)

## 2016-05-23 LAB — CK: Total CK: 172 U/L (ref 38–234)

## 2016-05-23 LAB — CG4 I-STAT (LACTIC ACID): Lactic Acid, Venous: 0.61 mmol/L (ref 0.5–1.9)

## 2016-05-23 LAB — URINE MICROSCOPIC-ADD ON

## 2016-05-23 LAB — CBG MONITORING, ED: Glucose-Capillary: 129 mg/dL — ABNORMAL HIGH (ref 65–99)

## 2016-05-23 LAB — I-STAT CG4 LACTIC ACID, ED: Lactic Acid, Venous: 1.39 mmol/L (ref 0.5–1.9)

## 2016-05-23 MED ORDER — HYDROXYZINE HCL 25 MG PO TABS: 25.0000 mg | ORAL_TABLET | Freq: Three times a day (TID) | ORAL | Status: DC | PRN

## 2016-05-23 MED ORDER — MUPIROCIN 2 % EX OINT
1.0000 "application " | TOPICAL_OINTMENT | Freq: Two times a day (BID) | CUTANEOUS | Status: DC
  Administered 2016-05-23 – 2016-05-25 (×5): 1 via NASAL
  Filled 2016-05-23: qty 22

## 2016-05-23 MED ORDER — SODIUM CHLORIDE 0.9 % IV SOLN
INTRAVENOUS | Status: AC
  Administered 2016-05-23 (×2): via INTRAVENOUS

## 2016-05-23 MED ORDER — ONDANSETRON HCL 4 MG PO TABS: 4.0000 mg | ORAL_TABLET | Freq: Four times a day (QID) | ORAL | Status: DC | PRN

## 2016-05-23 MED ORDER — DONEPEZIL HCL 5 MG PO TABS
10.0000 mg | ORAL_TABLET | Freq: Every day | ORAL | Status: DC
  Administered 2016-05-23 – 2016-05-24 (×2): 10 mg via ORAL
  Filled 2016-05-23 (×2): qty 2

## 2016-05-23 MED ORDER — SODIUM CHLORIDE 0.9 % IV BOLUS (SEPSIS)
1000.0000 mL | Freq: Once | INTRAVENOUS | Status: AC
  Administered 2016-05-23: 1000 mL via INTRAVENOUS

## 2016-05-23 MED ORDER — POTASSIUM CHLORIDE 10 MEQ/100ML IV SOLN
10.0000 meq | INTRAVENOUS | Status: AC
  Administered 2016-05-23 (×3): 10 meq via INTRAVENOUS
  Filled 2016-05-23 (×3): qty 100

## 2016-05-23 MED ORDER — CITALOPRAM HYDROBROMIDE 20 MG PO TABS
20.0000 mg | ORAL_TABLET | Freq: Every morning | ORAL | Status: DC
Start: 2016-05-23 — End: 2016-05-25
  Administered 2016-05-23 – 2016-05-25 (×3): 20 mg via ORAL
  Filled 2016-05-23 (×3): qty 1

## 2016-05-23 MED ORDER — NALOXONE HCL 0.4 MG/ML IJ SOLN
0.4000 mg | Freq: Once | INTRAMUSCULAR | Status: AC
  Administered 2016-05-23: 0.4 mg via INTRAVENOUS

## 2016-05-23 MED ORDER — ALBUTEROL SULFATE (2.5 MG/3ML) 0.083% IN NEBU: 2.5000 mg | INHALATION_SOLUTION | RESPIRATORY_TRACT | Status: DC | PRN

## 2016-05-23 MED ORDER — ACETAMINOPHEN 650 MG RE SUPP: 650.0000 mg | Freq: Four times a day (QID) | RECTAL | Status: DC | PRN

## 2016-05-23 MED ORDER — ROPINIROLE HCL 1 MG PO TABS
1.0000 mg | ORAL_TABLET | Freq: Every day | ORAL | Status: DC
  Administered 2016-05-23 – 2016-05-24 (×2): 1 mg via ORAL
  Filled 2016-05-23 (×2): qty 1

## 2016-05-23 MED ORDER — CHLORHEXIDINE GLUCONATE CLOTH 2 % EX PADS
6.0000 | MEDICATED_PAD | Freq: Every day | CUTANEOUS | Status: DC
  Administered 2016-05-23 – 2016-05-25 (×3): 6 via TOPICAL

## 2016-05-23 MED ORDER — HYDROCODONE-ACETAMINOPHEN 5-325 MG PO TABS
2.0000 | ORAL_TABLET | Freq: Once | ORAL | Status: AC
  Administered 2016-05-23: 2 via ORAL
  Filled 2016-05-23: qty 2

## 2016-05-23 MED ORDER — SODIUM CHLORIDE 0.9 % IV SOLN
INTRAVENOUS | Status: DC
  Administered 2016-05-23: 02:00:00 via INTRAVENOUS

## 2016-05-23 MED ORDER — ONDANSETRON HCL 4 MG/2ML IJ SOLN: 4.0000 mg | Freq: Four times a day (QID) | INTRAMUSCULAR | Status: DC | PRN

## 2016-05-23 MED ORDER — NALOXONE HCL 0.4 MG/ML IJ SOLN
INTRAMUSCULAR | Status: AC
  Administered 2016-05-23: 0.4 mg via INTRAVENOUS
  Filled 2016-05-23: qty 1

## 2016-05-23 MED ORDER — PANTOPRAZOLE SODIUM 40 MG PO TBEC
40.0000 mg | DELAYED_RELEASE_TABLET | Freq: Every day | ORAL | Status: DC
  Administered 2016-05-23 – 2016-05-25 (×3): 40 mg via ORAL
  Filled 2016-05-23 (×3): qty 1

## 2016-05-23 MED ORDER — VANCOMYCIN HCL IN DEXTROSE 1-5 GM/200ML-% IV SOLN
1000.0000 mg | INTRAVENOUS | Status: DC
  Administered 2016-05-23: 1000 mg via INTRAVENOUS
  Filled 2016-05-23: qty 200

## 2016-05-23 MED ORDER — PIPERACILLIN-TAZOBACTAM 3.375 G IVPB
3.3750 g | Freq: Three times a day (TID) | INTRAVENOUS | Status: DC
  Administered 2016-05-23 – 2016-05-24 (×4): 3.375 g via INTRAVENOUS
  Filled 2016-05-23 (×4): qty 50

## 2016-05-23 MED ORDER — SODIUM CHLORIDE 0.9 % IV SOLN
INTRAVENOUS | Status: AC
  Administered 2016-05-24: 08:00:00 via INTRAVENOUS

## 2016-05-23 MED ORDER — GABAPENTIN 300 MG PO CAPS
300.0000 mg | ORAL_CAPSULE | Freq: Two times a day (BID) | ORAL | Status: DC
  Administered 2016-05-23 – 2016-05-25 (×5): 300 mg via ORAL
  Filled 2016-05-23 (×5): qty 1

## 2016-05-23 MED ORDER — ACETAMINOPHEN 325 MG PO TABS
650.0000 mg | ORAL_TABLET | Freq: Four times a day (QID) | ORAL | Status: DC | PRN
  Administered 2016-05-23 – 2016-05-24 (×2): 650 mg via ORAL
  Filled 2016-05-23 (×3): qty 2

## 2016-05-23 NOTE — ED Triage Notes (Signed)
Pt brought in by [facility] for c/o fall and syncopal episode; ems states pt was found in the floor and when they tried to get her up she became apneic and pulseless for 5 seconds; ems reports pt's speech was slurred on scene; pt is alert and speech is coherent

## 2016-05-23 NOTE — ED Notes (Signed)
CRITICAL VALUE ALERT  Critical value received: INR- 4.33  Date of notification:   05/23/16  Time of notification:  0148  Critical value read back:Yes.    Nurse who received alert:  Idelia Salm, RN  MD notified (1st page):  [phone]  Time of first page:  0148  MD notified (2nd page):  Time of second page:  Responding MD:  Dr Leonides Schanz  Time MD responded:  [phone]

## 2016-05-23 NOTE — ED Provider Notes (Signed)
TIME SEEN: 12:45  CHIEF COMPLAINT: Loss of consciousness HPI: HPI Comments:  LEVEL 5 CAVEAT: CONDITION OF THE PATIENT  [name] Walker is a 82 y.o. female with history of hypertension, atrial fibrillation on Coumadin, COPD, previous DVT brought in by ambulance, who presents to the Emergency Department complaining of syncope.  Pt is a poor historian and her speech is very slurred.  She states her speech is always slurred, but that this is worse because she feels "loopy."  Shs denies pain, chest pain, and SOB.  EMS reports that they were called out for a fall and found her on the floor. With EMS she had 2 episodes of syncope. Blood sugar was in the 120s with EMS. She was hypotensive with EMS. She was just seen in the emergency department earlier today for right lower extremity cellulitis. Had x-rays that showed no osteomyelitis or subcutaneous gas. Was given a dose of vancomycin and discharged on Bactrim. Labs showed mild leukocytosis at that time.  ROS: Level V caveat for altered mental status  PAST MEDICAL HISTORY/PAST SURGICAL HISTORY:  Past Medical History:  Diagnosis Date  . Cataract   . COPD (chronic obstructive pulmonary disease) ([place])   . Depression   . DVT (deep venous thrombosis) ([place])   . Gastric erosion   . GERD (gastroesophageal reflux disease)   . Hypertension   . Insomnia   . Ischemic colitis ([place])   . Osteoarthritis   . Parotid mass 2016  . 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 Authorizing Provider  albuterol (PROVENTIL) (2.5 MG/3ML) 0.083% nebulizer solution Take 3 mLs (2.5 mg total) by nebulization every 2 (two) hours as needed for wheezing or shortness of breath. 07/31/15   Erline Hau, MD  amitriptyline (ELAVIL) 75 MG tablet TAKE 1 TABLETS AT BEDTIME 05/02/16   Claretta Fraise, MD  Calcium Carbonate-Vitamin D (CALCIUM-VITAMIN D)  500-200 MG-UNIT tablet Take 1 tablet by mouth every morning.    Historical Provider, MD  citalopram (CELEXA) 20 MG tablet TAKE 1 TABLET DAILY Patient taking differently: TAKE 1 TABLET DAILY IN THE MORNING 04/04/16   Claretta Fraise, MD  donepezil (ARICEPT) 10 MG tablet TAKE ONE TABLET AT BEDTIME 04/16/16   Claretta Fraise, MD  furosemide (LASIX) 40 MG tablet TAKE (2) TABLETS DAILY Patient taking differently: TAKE ONE TABLET BY MOUTH TWICE DAILY 04/26/16   Claretta Fraise, MD  gabapentin (NEURONTIN) 300 MG capsule TAKE (1) CAPSULE TWICE DAILY. 05/02/16   Claretta Fraise, MD  hydrOXYzine (ATARAX/VISTARIL) 10 MG tablet TAKE (1) TABLET THREE TIMES DAILY. 05/08/16   Claretta Fraise, MD  levocetirizine (XYZAL) 5 MG tablet Take 1 tablet (5 mg total) by mouth at bedtime. 12/21/15   Claretta Fraise, MD  omeprazole (PRILOSEC) 20 MG capsule TAKE 2 CAPSULES DAILY AS NEEDED FOR REFLUX Patient taking differently: TAKE 2 CAPSULES DAILY IN THE MORNING 01/10/16   Claretta Fraise, MD  OXYGEN Inhale into the lungs at bedtime.    Historical Provider, MD  potassium chloride (K-DUR) 10 MEQ tablet TAKE 4 TABLETS ONCE DAILY Patient taking differently: TAKE 4 TABLETS ONCE DAILY IN THE MORNING 05/08/16   Claretta Fraise, MD  rOPINIRole (REQUIP) 1 MG tablet TAKE 1 OR 2 TABLETS BY MOUTH AT BEDTIME 12/22/15   Chipper Herb, MD  sulfamethoxazole-trimethoprim (BACTRIM DS,SEPTRA DS) 800-160 MG tablet Take 2 tablets by mouth 2 (two) times daily. 05/22/16   Elta [place]  Jeneen Rinks, MD  traMADol (ULTRAM) 50 MG tablet Take 1 tablet (50 mg total) by mouth every 6 (six) hours as needed. 05/22/16   Tanna Furry, MD  triamcinolone cream (KENALOG) 0.1 % Apply 1 application topically daily. 05/15/16   Claretta Fraise, MD  verapamil (CALAN-SR) 180 MG CR tablet Take 1 tablet (180 mg total) by mouth at bedtime. 04/06/16   Claretta Fraise, MD  Vitamin D-Vitamin K (VITAMIN K2-VITAMIN D3 PO) Take 2 capsules by mouth every morning.    Historical Provider, MD  warfarin (COUMADIN) 2.5 MG tablet  Take 1 tablet (2.5 mg total) by mouth daily. Patient taking differently: Take 2.5-5 mg by mouth daily. Take one-half tablets on MWF, and take one tablet on all other days. 05/22/16   Cherre Robins, PharmD    ALLERGIES:  Allergies  Allergen Reactions  . Diltiazem Anxiety and Other (See Comments)    Sleep disturbance  . Iohexol      Desc: THROAT SWELLING-NOTED IN CHART AFTER IVC PLACEMENT-ARS 12-02-05   . Latex Other (See Comments)    unknown  . Penicillins Swelling  . Shellfish Allergy Other (See Comments)    Reaction unknown    SOCIAL HISTORY:  Social History  Substance Use Topics  . Smoking status: Never Smoker  . Smokeless tobacco: Never Used  . Alcohol use No    FAMILY HISTORY: Family History  Problem Relation Age of Onset  . Rheum arthritis Mother   . Ulcers Mother     on foot  . Ulcers Father   . Cancer Sister     uterine    EXAM: BP (!) 73/53   Pulse 71   Temp 97.7 F (36.5 C) (Rectal)   Resp 15   Ht 4\' 9"  (1.448 m)   Wt 170 lb (77.1 kg)   SpO2 92%   BMI 36.79 kg/m  CONSTITUTIONAL: Alert, elderly, nontoxic-appearing, very slurred speech and difficult to understand HEAD: Normocephalic; atraumatic EYES: Conjunctivae clear, PERRL, EOMI ENT: normal nose; no rhinorrhea; dry mucous membranes; pharynx without lesions noted; no dental injury; no septal hematoma NECK: Supple, no meningismus, no LAD; no midline spinal tenderness, step-off or deformity CARD: Irregularly irregular; S1 and S2 appreciated; no murmurs, no clicks, no rubs, no gallops RESP: Normal chest excursion without splinting or tachypnea; breath sounds clear and equal bilaterally; no wheezes, no rhonchi, no rales; no hypoxia or respiratory distress CHEST:  chest wall stable, no crepitus or ecchymosis or deformity, nontender to palpation ABD/GI: Normal bowel sounds; non-distended; soft, non-tender, no rebound, no guarding PELVIS:  stable, nontender to palpation BACK:  The back appears normal and is  non-tender to palpation, there is no CVA tenderness; no midline spinal tenderness, step-off or deformity EXT: Patient has erythema and warmth noted to the right dorsal foot into the distal right shin and calf with some induration but no fluctuance. She does have a superficial wound noted to the right inner ankle. 2+ DP pulses bilaterally. No joint effusion. Compartment are soft. Normal ROM in all joints; extremities are non-tender to palpation; no edema; normal capillary refill; no cyanosis, no bony tenderness or bony deformity of patient's extremities, no joint effusion, no ecchymosis or lacerations    SKIN: Normal color for age and race; warm NEURO: Moves all extremities equally, sensation to light touch intact diffusely, cranial nerves II through XII intact, patient has dysarthria   MEDICAL DECISION MAKING: Patient here with fall at home and to syncopal events with EMS. She is extremely hypotensive. Was just in the emergency  department for right foot cellulitis. That time had a normal blood pressure. Found to have a mild leukocytosis. She has no current complaints. She is extremely dysarthric but her daughter Vaughan Basta states that this is chronic for her. She is on Elavil at home and was discharged with tramadol. She did receive 2 doses of morphine at 2020 and 2123 while in the emergency department yesterday. Given Narcan in the emergency department without any significant change in symptoms.  She does appear very dry on exam. This could be hypotension related to hypovolemia versus sepsis. We'll give IV Zosyn, IV fluids. Will obtain rectal temp, cultures.  Given patient did have a fall and is on Coumadin, will obtain a head CT and cervical spine CT. She denies any pain currently. This also could be related to medications. I feel this time given her acute change she will need admission to the hospital. Daughter is on her way to the emergency department and is comfortable with this plan. She confirms the patient  is a DO NOT RESUSCITATE/DO NOT INTUBATE.  ED PROGRESS: 1:45 AM  Pt's rectal temperature is 97.7. Blood pressures were initially in the 70s/50s but have not improved to the 80s/60s with IV hydration. Patient's heart rate is in the 70s. Labs show improvement of leukocytosis now 12,000.  Patient has mild history of present illness with creatinine of 1.35.  3:00 AM  BP continued to improve with IV hydration. Urine shows no sign of infection. Chest x-ray shows no infiltrate. CT of the head shows no acute abnormality but CT of the cervical spine shows possible acute anterior C5 fracture is minimally displaced. We'll discuss with neurosurgery on call. We'll place her in a c-collar.  3:20 AM  D/w Dr. Cyndy Freeze with neurosurgery. He does not feel patient needs to be in a cervical collar. Reports this fracture is very small. Does not need outpatient follow-up or MRI. Will admit to medicine for treatment of her hypotension, syncope, cellulitis.  3:40 AM  Discussed patient's case with hospitalist, Dr. Darrick Meigs.  Recommend admission to inpatient, stepdown bed.  I will place holding orders per their request. Patient and family (if present) updated with plan. Care transferred to hospitalist service.  I reviewed all nursing notes, vitals, pertinent old records, EKGs, labs, imaging (as available).    EKG Interpretation  Date/Time:  Wednesday May 23 2016 00:53:04 EDT Ventricular Rate:  61 PR Interval:    QRS Duration: 97 QT Interval:  458 QTC Calculation: 462 R Axis:   31 Text Interpretation:  Atrial fibrillation Abnormal R-wave progression, early transition Nonspecific T abnormalities, lateral leads No significant change since last tracing Confirmed by Rhylin Venters,  DO, Justeen Hehr [id number]) on 05/23/2016 12:57:41 AM        CRITICAL CARE Performed by: Nyra Jabs   Total critical care time: 45 minutes  Critical care time was exclusive of separately billable procedures and treating other patients.  Critical care  was necessary to treat or prevent imminent or life-threatening deterioration.  Critical care was time spent personally by me on the following activities: development of treatment plan with patient and/or surrogate as well as nursing, discussions with consultants, evaluation of patient's response to treatment, examination of patient, obtaining history from patient or surrogate, ordering and performing treatments and interventions, ordering and review of laboratory studies, ordering and review of radiographic studies, pulse oximetry and re-evaluation of patient's condition.     Level Green, DO 05/23/16 [phone]

## 2016-05-23 NOTE — Progress Notes (Signed)
ANTIBIOTIC CONSULT NOTE-Preliminary  Pharmacy Consult for zosyn, vancomycin Indication: sepsis  Allergies  Allergen Reactions  . Diltiazem Anxiety and Other (See Comments)    Sleep disturbance  . Iohexol      Desc: THROAT SWELLING-NOTED IN CHART AFTER IVC PLACEMENT-ARS 12-02-05   . Latex Other (See Comments)    unknown  . Penicillins Swelling  . Shellfish Allergy Other (See Comments)    Reaction unknown    Patient Measurements: Height: 4\' 9"  (144.8 cm) Weight: 170 lb (77.1 kg) IBW/kg (Calculated) : 38.6 Adjusted Body Weight:   Vital Signs: Temp: 97.7 F (36.5 C) (08/30 0145) Temp Source: Rectal (08/30 0145) BP: 173/53 (08/30 0059) Pulse Rate: 71 (08/30 0059)  Labs:  Recent Labs  05/22/16 1857 05/23/16 0055  WBC 15.0* 12.0*  HGB 10.6* 9.6*  PLT 203 189  CREATININE 1.16* 1.35*    Estimated Creatinine Clearance: 24.6 mL/min (by C-G formula based on SCr of 1.35 mg/dL).  No results for input(s): VANCOTROUGH, VANCOPEAK, VANCORANDOM, GENTTROUGH, GENTPEAK, GENTRANDOM, TOBRATROUGH, TOBRAPEAK, TOBRARND, AMIKACINPEAK, AMIKACINTROU, AMIKACIN in the last 72 hours.   Microbiology: No results found for this or any previous visit (from the past 720 hour(s)).  Medical History: Past Medical History:  Diagnosis Date  . Cataract   . COPD (chronic obstructive pulmonary disease) ([place])   . Depression   . DVT (deep venous thrombosis) ([place])   . Gastric erosion   . GERD (gastroesophageal reflux disease)   . Hypertension   . Insomnia   . Ischemic colitis ([place])   . Osteoarthritis   . Parotid mass 2016  . 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 sodium chloride 125 mL/hr at 05/23/16 0145  . piperacillin-tazobactam (ZOSYN)  IV    . sodium chloride 1,000 mL (05/23/16 0130)   PRN:  Anti-infectives    Start     Dose/Rate Route Frequency Ordered Stop   05/23/16 0215   piperacillin-tazobactam (ZOSYN) IVPB 3.375 g     3.375 g 12.5 mL/hr over 240 Minutes Intravenous Every 8 hours 05/23/16 0201        Assessment: Pt fell at home and has been having syncopal events. Was in ED earlier and received 1 dose of vancomycin for right foot cellulitis.Pt currently very hypotensive.   Goal of Therapy:  Vancomycin trough level 15-20 mcg/ml  Plan:  Preliminary review of pertinent patient information completed.  Protocol will be initiated with zosyn 3.375 g IV Q 8 hours. Vanc given on previous ED visit at 2020 and does not need a dose at this time. [place] clinical pharmacist will complete review during morning rounds to assess patient and finalize treatment regimen.  Jesusa Stenerson Scarlett, RPH 05/23/2016,2:02 AM

## 2016-05-23 NOTE — ED Notes (Signed)
Manual BP 90/60 

## 2016-05-23 NOTE — ED Notes (Signed)
Pt cbg was 126 with ems

## 2016-05-23 NOTE — Progress Notes (Signed)
ANTICOAGULATION CONSULT NOTE - Initial Consult  Pharmacy Consult for WARFARIN (chronic Rx PTA) Indication: atrial fibrillation  Allergies  Allergen Reactions  . Diltiazem Anxiety and Other (See Comments)    Sleep disturbance  . Iohexol      Desc: THROAT SWELLING-NOTED IN CHART AFTER IVC PLACEMENT-ARS 12-02-05   . Latex Other (See Comments)    unknown  . Penicillins Swelling  . Shellfish Allergy Other (See Comments)    Reaction unknown   Patient Measurements: Height: 4\' 9"  (144.8 cm) Weight: 180 lb 8.9 oz (81.9 kg) IBW/kg (Calculated) : 38.6  Vital Signs: Temp: 97.6 F (36.4 C) (08/30 0747) Temp Source: Oral (08/30 0747) BP: 89/60 (08/30 0800) Pulse Rate: 68 (08/30 0800)  Labs:  Recent Labs  05/22/16 1236  05/22/16 1857 05/23/16 0055 05/23/16 0537  HGB  --   < > 10.6* 9.6* 9.2*  HCT  --   --  31.8* 28.9* 28.3*  PLT  --   --  203 189 188  LABPROT  --   --   --  42.6*  --   INR 5.3*  --   --  4.33*  --   CREATININE  --   --  1.16* 1.35* 1.19*  CKTOTAL  --   --   --  172  --   TROPONINI  --   --   --  0.03* <0.03  < > = values in this interval not displayed.  Estimated Creatinine Clearance: 28.8 mL/min (by C-G formula based on SCr of 1.19 mg/dL).   Medical History: Past Medical History:  Diagnosis Date  . Cataract   . COPD (chronic obstructive pulmonary disease) ([place])   . Depression   . DVT (deep venous thrombosis) ([place])   . Gastric erosion   . GERD (gastroesophageal reflux disease)   . Hypertension   . Insomnia   . Ischemic colitis ([place])   . Osteoarthritis   . Parotid mass 2016  . 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 albuterol (PROVENTIL) (2.5 MG/3ML) 0.083% nebulizer solution Take 3 mLs (2.5 mg total) by nebulization every 2 (two) hours as needed for wheezing or shortness of breath. 75 mL 12 unknown  . amitriptyline (ELAVIL)  75 MG tablet TAKE 1 TABLETS AT BEDTIME 30 tablet 2 05/21/2016 at Unknown time  . Calcium Carbonate-Vitamin D (CALCIUM-VITAMIN D) 500-200 MG-UNIT tablet Take 1 tablet by mouth every morning.   05/21/2016 at Unknown time  . citalopram (CELEXA) 20 MG tablet TAKE 1 TABLET DAILY (Patient taking differently: TAKE 1 TABLET DAILY IN THE MORNING) 30 tablet 0 05/21/2016 at Unknown time  . donepezil (ARICEPT) 10 MG tablet TAKE ONE TABLET AT BEDTIME 90 tablet 1 05/21/2016 at Unknown time  . furosemide (LASIX) 40 MG tablet TAKE (2) TABLETS DAILY (Patient taking differently: TAKE ONE TABLET BY MOUTH TWICE DAILY) 60 tablet 0 05/21/2016 at Unknown time  . gabapentin (NEURONTIN) 300 MG capsule TAKE (1) CAPSULE TWICE DAILY. 60 capsule 3 05/21/2016 at Unknown time  . hydrOXYzine (ATARAX/VISTARIL) 10 MG tablet TAKE (1) TABLET THREE TIMES DAILY. 90 tablet 2 05/21/2016 at Unknown time  . levocetirizine (XYZAL) 5 MG tablet Take 1 tablet (5 mg total) by mouth at bedtime. 90 tablet 2 05/21/2016 at Unknown time  . omeprazole (PRILOSEC) 20 MG capsule TAKE 2 CAPSULES DAILY AS NEEDED FOR REFLUX (Patient taking differently: TAKE 2 CAPSULES DAILY IN THE MORNING) 60  capsule 3 05/21/2016 at Unknown time  . OXYGEN Inhale into the lungs at bedtime.     . potassium chloride (K-DUR) 10 MEQ tablet TAKE 4 TABLETS ONCE DAILY (Patient taking differently: TAKE 4 TABLETS ONCE DAILY IN THE MORNING) 120 tablet 4 05/21/2016 at Unknown time  . rOPINIRole (REQUIP) 1 MG tablet TAKE 1 OR 2 TABLETS BY MOUTH AT BEDTIME 56 tablet 3 05/21/2016 at Unknown time  . sulfamethoxazole-trimethoprim (BACTRIM DS,SEPTRA DS) 800-160 MG tablet Take 2 tablets by mouth 2 (two) times daily. 40 tablet 0   . traMADol (ULTRAM) 50 MG tablet Take 1 tablet (50 mg total) by mouth every 6 (six) hours as needed. 15 tablet 0   . triamcinolone cream (KENALOG) 0.1 % Apply 1 application topically daily. 30 g 1 05/22/2016 at Unknown time  . verapamil (CALAN-SR) 180 MG CR tablet Take 1 tablet  (180 mg total) by mouth at bedtime. 30 tablet 2 05/21/2016 at Unknown time  . Vitamin D-Vitamin K (VITAMIN K2-VITAMIN D3 PO) Take 2 capsules by mouth every morning.   05/21/2016 at Unknown time  . warfarin (COUMADIN) 2.5 MG tablet Take 1 tablet (2.5 mg total) by mouth daily. (Patient taking differently: Take 2.5-5 mg by mouth daily. Take one-half tablets on MWF, and take one tablet on all other days.) 30 tablet 1 05/21/2016 at 2200    Assessment: 82yo female on chronic Coumadin PTA.  Home dose listed above.  INR > 4 today, SUPRAtherapeutic.  Pt brought to hospital after a fall.  CT cervical spine showed minimally displaced fracture of C5 vertebral body.  Goal of Therapy:  INR 2-3 Monitor platelets by anticoagulation protocol: Yes   Plan:  HOLD coumadin today Allow INR to trend down toward goal range INR daily, monitor CBC, s/sx of bleeding complications  Hart Robinsons A 05/23/2016,8:24 AM

## 2016-05-23 NOTE — Progress Notes (Signed)
ANTIBIOTIC CONSULT NOTE- follow up  Pharmacy Consult for zosyn, vancomycin Indication: sepsis  Allergies  Allergen Reactions  . Diltiazem Anxiety and Other (See Comments)    Sleep disturbance  . Iohexol      Desc: THROAT SWELLING-NOTED IN CHART AFTER IVC PLACEMENT-ARS 12-02-05   . Latex Other (See Comments)    unknown  . Penicillins Swelling  . Shellfish Allergy Other (See Comments)    Reaction unknown   Patient Measurements: Height: 4\' 9"  (144.8 cm) Weight: 180 lb 8.9 oz (81.9 kg) IBW/kg (Calculated) : 38.6 Adjusted Body Weight:   Vital Signs: Temp: 97.6 F (36.4 C) (08/30 0747) Temp Source: Oral (08/30 0747) BP: 89/60 (08/30 0800) Pulse Rate: 68 (08/30 0800)  Labs:  Recent Labs  05/22/16 1857 05/23/16 0055 05/23/16 0537  WBC 15.0* 12.0* 11.8*  HGB 10.6* 9.6* 9.2*  PLT 203 189 188  CREATININE 1.16* 1.35* 1.19*    Estimated Creatinine Clearance: 28.8 mL/min (by C-G formula based on SCr of 1.19 mg/dL).  No results for input(s): VANCOTROUGH, VANCOPEAK, VANCORANDOM, GENTTROUGH, GENTPEAK, GENTRANDOM, TOBRATROUGH, TOBRAPEAK, TOBRARND, AMIKACINPEAK, AMIKACINTROU, AMIKACIN in the last 72 hours.   Microbiology: Recent Results (from the past 720 hour(s))  Blood culture (routine x 2)     Status: None (Preliminary result)   Collection Time: 05/23/16  1:25 AM  Result Value Ref Range Status   Specimen Description BLOOD LEFT FOREARM  Final   Special Requests BOTTLES DRAWN AEROBIC AND ANAEROBIC 4CC  Final   Culture PENDING  Incomplete   Report Status PENDING  Incomplete  Blood culture (routine x 2)     Status: None (Preliminary result)   Collection Time: 05/23/16  1:30 AM  Result Value Ref Range Status   Specimen Description BLOOD LEFT HAND  Final   Special Requests BOTTLES DRAWN AEROBIC AND ANAEROBIC 6CC  Final   Culture PENDING  Incomplete   Report Status PENDING  Incomplete  MRSA PCR Screening     Status: Abnormal   Collection Time: 05/23/16  5:23 AM  Result Value  Ref Range Status   MRSA by PCR POSITIVE (A) NEGATIVE Final    Comment:        The GeneXpert MRSA Assay (FDA approved for NASAL specimens only), is one component of a comprehensive MRSA colonization surveillance program. It is not intended to diagnose MRSA infection nor to guide or monitor treatment for MRSA infections. RESULT CALLED TO, READ BACK BY AND VERIFIED WITH: BETHEL E. AT 0730A ON AT:[id number] BY THOMPSON S.     Medical History: Past Medical History:  Diagnosis Date  . Cataract   . COPD (chronic obstructive pulmonary disease) ([place])   . Depression   . DVT (deep venous thrombosis) ([place])   . Gastric erosion   . GERD (gastroesophageal reflux disease)   . Hypertension   . Insomnia   . Ischemic colitis ([place])   . Osteoarthritis   . Parotid mass 2016  . 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 albuterol (PROVENTIL) (2.5 MG/3ML) 0.083% nebulizer solution Take 3 mLs (2.5 mg total) by nebulization every 2 (two) hours as needed for wheezing or shortness of breath. 75 mL 12 unknown  . amitriptyline (ELAVIL) 75 MG tablet TAKE 1 TABLETS AT BEDTIME 30 tablet 2 05/21/2016 at Unknown time  . Calcium Carbonate-Vitamin D (CALCIUM-VITAMIN D) 500-200 MG-UNIT tablet Take 1 tablet by mouth every morning.   05/21/2016 at  Unknown time  . citalopram (CELEXA) 20 MG tablet TAKE 1 TABLET DAILY (Patient taking differently: TAKE 1 TABLET DAILY IN THE MORNING) 30 tablet 0 05/21/2016 at Unknown time  . donepezil (ARICEPT) 10 MG tablet TAKE ONE TABLET AT BEDTIME 90 tablet 1 05/21/2016 at Unknown time  . furosemide (LASIX) 40 MG tablet TAKE (2) TABLETS DAILY (Patient taking differently: TAKE ONE TABLET BY MOUTH TWICE DAILY) 60 tablet 0 05/21/2016 at Unknown time  . gabapentin (NEURONTIN) 300 MG capsule TAKE (1) CAPSULE TWICE DAILY. 60 capsule 3 05/21/2016 at Unknown time  . hydrOXYzine  (ATARAX/VISTARIL) 10 MG tablet TAKE (1) TABLET THREE TIMES DAILY. 90 tablet 2 05/21/2016 at Unknown time  . levocetirizine (XYZAL) 5 MG tablet Take 1 tablet (5 mg total) by mouth at bedtime. 90 tablet 2 05/21/2016 at Unknown time  . omeprazole (PRILOSEC) 20 MG capsule TAKE 2 CAPSULES DAILY AS NEEDED FOR REFLUX (Patient taking differently: TAKE 2 CAPSULES DAILY IN THE MORNING) 60 capsule 3 05/21/2016 at Unknown time  . OXYGEN Inhale into the lungs at bedtime.     . potassium chloride (K-DUR) 10 MEQ tablet TAKE 4 TABLETS ONCE DAILY (Patient taking differently: TAKE 4 TABLETS ONCE DAILY IN THE MORNING) 120 tablet 4 05/21/2016 at Unknown time  . rOPINIRole (REQUIP) 1 MG tablet TAKE 1 OR 2 TABLETS BY MOUTH AT BEDTIME 56 tablet 3 05/21/2016 at Unknown time  . sulfamethoxazole-trimethoprim (BACTRIM DS,SEPTRA DS) 800-160 MG tablet Take 2 tablets by mouth 2 (two) times daily. 40 tablet 0   . traMADol (ULTRAM) 50 MG tablet Take 1 tablet (50 mg total) by mouth every 6 (six) hours as needed. 15 tablet 0   . triamcinolone cream (KENALOG) 0.1 % Apply 1 application topically daily. 30 g 1 05/22/2016 at Unknown time  . verapamil (CALAN-SR) 180 MG CR tablet Take 1 tablet (180 mg total) by mouth at bedtime. 30 tablet 2 05/21/2016 at Unknown time  . Vitamin D-Vitamin K (VITAMIN K2-VITAMIN D3 PO) Take 2 capsules by mouth every morning.   05/21/2016 at Unknown time  . warfarin (COUMADIN) 2.5 MG tablet Take 1 tablet (2.5 mg total) by mouth daily. (Patient taking differently: Take 2.5-5 mg by mouth daily. Take one-half tablets on MWF, and take one tablet on all other days.) 30 tablet 1 05/21/2016 at 2200   Scheduled:  . sodium chloride   Intravenous STAT  . Chlorhexidine Gluconate Cloth  6 each Topical Q0600  . citalopram  20 mg Oral q morning - 10a  . donepezil  10 mg Oral QHS  . gabapentin  300 mg Oral BID  . mupirocin ointment  1 application Nasal BID  . pantoprazole  40 mg Oral Daily  . piperacillin-tazobactam (ZOSYN)  IV   3.375 g Intravenous Q8H  . potassium chloride  10 mEq Intravenous Q1 Hr x 3  . rOPINIRole  1 mg Oral QHS  . vancomycin  1,000 mg Intravenous Q24H   Infusions:    PRN:  Anti-infectives    Start     Dose/Rate Route Frequency Ordered Stop   05/23/16 1600  vancomycin (VANCOCIN) IVPB 1000 mg/200 mL premix     1,000 mg 200 mL/hr over 60 Minutes Intravenous Every 24 hours 05/23/16 0741     05/23/16 0215  piperacillin-tazobactam (ZOSYN) IVPB 3.375 g     3.375 g 12.5 mL/hr over 240 Minutes Intravenous Every 8 hours 05/23/16 0201       Assessment: Pt fell at home and has been having syncopal events. Was in  ED earlier and received 1 dose of vancomycin for right foot cellulitis.  Pt currently very hypotensive.  SCr at baseline.  Goal of Therapy:  Vancomycin trough level 15-20 mcg/ml  Plan:   Vancomycin 1000mg  IV q24hrs  Zosyn 3.375gm IV q8h, EID  Monitor labs, renal fxn, cultures  Deescalate ABX when appropriate  [place] Crane, Mariyana Fulop A, RPH 05/23/2016,8:28 AM

## 2016-05-23 NOTE — Progress Notes (Signed)
PROGRESS NOTE    [name] Walker  [id number] DOB: [birth year] DOA: 05/23/2016 PCP: Claretta Fraise, MD    Brief Narrative:  82 y.o. female, With history of hypertension, atrial fibrillation on anticoagulation with Coumadin, COPD, previous history of DVT was brought to the hospital by EMS after a fall. Patient was obviously seen in the ED for complaints of right lower extremity swelling, ulcer on right ankle, patient was given 1 dose of vancomycin and morphine 4 mg IV 1 patient was discharged on Bactrim.  At home patient became very weak was unable to stand and fell. Though she denies passing out. She denies chest pain, no shortness of breath. No nausea vomiting or diarrhea. In the ED patient found to be hypotensive with blood pressure 73/53, required IV fluid boluses. Lactic acid 1.39  CT cervical spine showed Minimally displaced fracture of the anterior superior corner of the C5 vertebral body, neurosurgery  Dr Cyndy Freeze was consulted by the ED physician, who recommended no cervical collar no need of outpatient follow-up or MRI  Assessment & Plan:   Principal Problem:   Sepsis due to cellulitis [facility]) Active Problems:   Essential hypertension   COPD (chronic obstructive pulmonary disease) ([facility])   Atrial fibrillation, chronic ([facility])   Hypotension   Hypokalemia   1. Right lower extremity cellulitis with sepsis and septic shock present on admission 1. Right lower extremity with increased redness, tenderness and swelling 2. Patient presented with hypothermia and mild leukocytosis of 11.8 thousand with hypotension 3. Hypotension improved with IV fluid hydration 4. Continue to hold blood pressure medications for the time being 5. Continue IV antibiotics as tolerated 6. Follow-up on pancultures, currently pending 2. Hypokalemia 1. Will replace 3. Chronic atrial fibrillation 1. Presently rate controlled. 2. Continue to monitor on telemetry 3. On Coumadin for secondary stroke  prevention, dosing per pharmacy 4. Minimally displaced fracture of the anterior superior corner of the C5 vertebral body 1. See resulting H&P 2. Case was discussed with neurosurgery on call. 3. Supportive care for now 5. Status post fall 1. When more stable, will consult physical therapy 6. Hypertension 1. Patient presented in presumed septic shock, improved with IV fluids 2. Lasix, verapamil currently on hold  DVT prophylaxis: Coumadin Code Status: DO NOT RESUSCITATE Family Communication: Patient in room, family not at bedside Disposition Plan: Uncertain at this time  Consultants:     Procedures:     Antimicrobials: Anti-infectives    Start     Dose/Rate Route Frequency Ordered Stop   05/23/16 1600  vancomycin (VANCOCIN) IVPB 1000 mg/200 mL premix     1,000 mg 200 mL/hr over 60 Minutes Intravenous Every 24 hours 05/23/16 0741     05/23/16 0215  piperacillin-tazobactam (ZOSYN) IVPB 3.375 g     3.375 g 12.5 mL/hr over 240 Minutes Intravenous Every 8 hours 05/23/16 0201        Subjective: Patient reports continued pain in her right leg described as sharp. Denies shortness of breath. Still states she feels generally weak  Objective: Vitals:   05/23/16 0900 05/23/16 1000 05/23/16 1100 05/23/16 1115  BP: (!) 93/53 (!) 91/58 (!) 96/50 108/79  Pulse: 67 66 65 66  Resp: 15 13 12 10   Temp:    98.4 F (36.9 C)  TempSrc:    Oral  SpO2: 100% 100% 100% 100%  Weight:      Height:        Intake/Output Summary (Last 24 hours) at 05/23/16 1356 Last data filed at 05/23/16 1100  Gross per 24 hour  Intake             4975 ml  Output              514 ml  Net             4461 ml   Filed Weights   05/23/16 0048 05/23/16 0059 05/23/16 0543  Weight: 77.1 kg (170 lb) 77.1 kg (170 lb) 81.9 kg (180 lb 8.9 oz)    Examination:  General exam: Appears calm and comfortable, Lying in bed  Respiratory system: Clear to auscultation. Respiratory effort normal. Cardiovascular system:  S1 & S2 heard, RRR. No JVD, murmurs, rubs, gallops or clicks. No pedal edema. Gastrointestinal system: Abdomen is nondistended, soft and nontender. No organomegaly or masses felt. Normal bowel sounds heard. Central nervous system: Alert and oriented. No focal neurological deficits. Extremities: Symmetric 5 x 5 power. Skin: Bilateral lower extremities with chronic venous stasis changes. Right lower extremity with increased redness swelling and pain on palpation. Inner aspect of right ankle with open nondraining wound Psychiatry: Judgement and insight appear normal. Mood & affect appropriate.   Data Reviewed: I have personally reviewed following labs and imaging studies  CBC:  Recent Labs Lab 05/22/16 1857 05/23/16 0055 05/23/16 0537  WBC 15.0* 12.0* 11.8*  NEUTROABS 11.9* 9.5*  --   HGB 10.6* 9.6* 9.2*  HCT 31.8* 28.9* 28.3*  MCV 93.5 93.5 96.9  PLT 203 189 0000000   Basic Metabolic Panel:  Recent Labs Lab 05/22/16 1857 05/23/16 0055 05/23/16 0537  NA 138 138 141  K 2.9* 3.1* 3.4*  CL 100* 102 106  CO2 28 28 26   GLUCOSE 93 132* 123*  BUN 21* 21* 20  CREATININE 1.16* 1.35* 1.19*  CALCIUM 9.1 8.5* 7.8*   GFR: Estimated Creatinine Clearance: 28.8 mL/min (by C-G formula based on SCr of 1.19 mg/dL). Liver Function Tests:  Recent Labs Lab 05/23/16 0055 05/23/16 0537  AST 23 32  ALT 11* 16  ALKPHOS 94 104  BILITOT 1.0 0.8  PROT 6.3* 5.8*  ALBUMIN 3.1* 2.9*   No results for input(s): LIPASE, AMYLASE in the last 168 hours. No results for input(s): AMMONIA in the last 168 hours. Coagulation Profile:  Recent Labs Lab 05/22/16 1236 05/23/16 0055  INR 5.3* 4.33*   Cardiac Enzymes:  Recent Labs Lab 05/23/16 0055 05/23/16 0537 05/23/16 1204  CKTOTAL 172  --   --   TROPONINI 0.03* <0.03 <0.03   BNP (last 3 results) No results for input(s): PROBNP in the last 8760 hours. HbA1C: No results for input(s): HGBA1C in the last 72 hours. CBG:  Recent Labs Lab  05/23/16 0056  GLUCAP 129*   Lipid Profile: No results for input(s): CHOL, HDL, LDLCALC, TRIG, CHOLHDL, LDLDIRECT in the last 72 hours. Thyroid Function Tests: No results for input(s): TSH, T4TOTAL, FREET4, T3FREE, THYROIDAB in the last 72 hours. Anemia Panel: No results for input(s): VITAMINB12, FOLATE, FERRITIN, TIBC, IRON, RETICCTPCT in the last 72 hours. Sepsis Labs:  Recent Labs Lab 05/23/16 0158 05/23/16 0517 05/23/16 0537 05/23/16 0806  LATICACIDVEN 1.39 0.61 0.6 0.6    Recent Results (from the past 240 hour(s))  Blood culture (routine x 2)     Status: None (Preliminary result)   Collection Time: 05/23/16  1:25 AM  Result Value Ref Range Status   Specimen Description BLOOD LEFT FOREARM  Final   Special Requests BOTTLES DRAWN AEROBIC AND ANAEROBIC 4CC  Final   Culture NO GROWTH < 12  HOURS  Final   Report Status PENDING  Incomplete  Blood culture (routine x 2)     Status: None (Preliminary result)   Collection Time: 05/23/16  1:30 AM  Result Value Ref Range Status   Specimen Description BLOOD LEFT HAND  Final   Special Requests BOTTLES DRAWN AEROBIC AND ANAEROBIC 6CC  Final   Culture NO GROWTH < 12 HOURS  Final   Report Status PENDING  Incomplete  MRSA PCR Screening     Status: Abnormal   Collection Time: 05/23/16  5:23 AM  Result Value Ref Range Status   MRSA by PCR POSITIVE (A) NEGATIVE Final    Comment:        The GeneXpert MRSA Assay (FDA approved for NASAL specimens only), is one component of a comprehensive MRSA colonization surveillance program. It is not intended to diagnose MRSA infection nor to guide or monitor treatment for MRSA infections. RESULT CALLED TO, READ BACK BY AND VERIFIED WITH: BETHEL E. AT 0730A ON LI:[id number] BY THOMPSON S.      Radiology Studies: Dg Ankle Complete Right  Result Date: 05/22/2016 CLINICAL DATA:  RIGHT ankle foot and drainage for 1 month. Medial ankle ulcer. EXAM: RIGHT ANKLE - COMPLETE 3+ VIEW COMPARISON:  RIGHT ankle  radiograph March 10, 2014 FINDINGS: There is no evidence of fracture, dislocation, or joint effusion. There is no evidence of arthropathy or other focal bone abnormality. Moderate medial ankle soft tissue swelling though, decreased from prior examination. Focal median ankle subcutaneous gas most compatible with ulcer. No radiopaque foreign bodies. IMPRESSION: Medial ankle soft tissue swelling and ulceration. No acute osseous process. Electronically Signed   By: Elon Alas M.D.   On: 05/22/2016 19:06   Ct Head Wo Contrast  Result Date: 05/23/2016 CLINICAL DATA:  Atrial fibrillation. On anti coagulation. Status post fall. EXAM: CT HEAD WITHOUT CONTRAST CT CERVICAL SPINE WITHOUT CONTRAST TECHNIQUE: Multidetector CT imaging of the head and cervical spine was performed following the standard protocol without intravenous contrast. Multiplanar CT image reconstructions of the cervical spine were also generated. COMPARISON:  Head CT 04/18/2015 and neck CT 04/18/2015 FINDINGS: CT HEAD FINDINGS There is no mass lesion, intraparenchymal hemorrhage or extra-axial collection. No evidence of acute cortical infarct. No hydrocephalus. 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 Brain parenchyma otherwise normal for age. Normal ventricles and sulci. The paranasal sinuses and mastoids are free of fluid. The orbits are normal. Normal skull and visualized extracranial soft tissues. CT CERVICAL SPINE FINDINGS There is a minimally displaced fracture of the anterior superior corner of the C5 vertebral body (series 8, image 39). No static subluxation. Facets are normally aligned. The occipital condyles are aligned. The dens is intact.There is extensive facet arthrosis with moderate to severe narrowing of the neural foramina at multiple levels. No advanced spinal canal stenosis.Visualized paraspinal soft tissues and lung apices are normal. IMPRESSION: 1. Minimally displaced fracture of the anterior superior corner of the C5 vertebral  body. This is somewhat age indeterminate, but was not present on neck CT of 04/18/2015 and is favored to be acute. 2. No acute intracranial abnormality. Electronically Signed   By: Ulyses Jarred M.D.   On: 05/23/2016 02:22   Ct Cervical Spine Wo Contrast  Result Date: 05/23/2016 CLINICAL DATA:  Atrial fibrillation. On anti coagulation. Status post fall. EXAM: CT HEAD WITHOUT CONTRAST CT CERVICAL SPINE WITHOUT CONTRAST TECHNIQUE: Multidetector CT imaging of the head and cervical spine was performed following the standard protocol without intravenous contrast. Multiplanar CT image reconstructions of  the cervical spine were also generated. COMPARISON:  Head CT 04/18/2015 and neck CT 04/18/2015 FINDINGS: CT HEAD FINDINGS There is no mass lesion, intraparenchymal hemorrhage or extra-axial collection. No evidence of acute cortical infarct. No hydrocephalus. 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 Brain parenchyma otherwise normal for age. Normal ventricles and sulci. The paranasal sinuses and mastoids are free of fluid. The orbits are normal. Normal skull and visualized extracranial soft tissues. CT CERVICAL SPINE FINDINGS There is a minimally displaced fracture of the anterior superior corner of the C5 vertebral body (series 8, image 39). No static subluxation. Facets are normally aligned. The occipital condyles are aligned. The dens is intact.There is extensive facet arthrosis with moderate to severe narrowing of the neural foramina at multiple levels. No advanced spinal canal stenosis.Visualized paraspinal soft tissues and lung apices are normal. IMPRESSION: 1. Minimally displaced fracture of the anterior superior corner of the C5 vertebral body. This is somewhat age indeterminate, but was not present on neck CT of 04/18/2015 and is favored to be acute. 2. No acute intracranial abnormality. Electronically Signed   By: Ulyses Jarred M.D.   On: 05/23/2016 02:22   Dg Chest Portable 1 View  Result Date:  05/23/2016 CLINICAL DATA:  [place] at home tonight.  Found on floor. EXAM: PORTABLE CHEST 1 VIEW COMPARISON:  07/28/2015 FINDINGS: There is mild unchanged cardiomegaly. The lungs are clear. There is no large effusion. Mediastinal and hilar contours are unremarkable and unchanged. Incidental findings include chronic arthropathy of both shoulders. IMPRESSION: Unchanged cardiomegaly.  No acute findings. Electronically Signed   By: Andreas Newport M.D.   On: 05/23/2016 01:40   Dg Foot Complete Right  Result Date: 05/22/2016 CLINICAL DATA:  Right foot and ankle pain. Drainage for over a month from the medial ankle. EXAM: RIGHT FOOT COMPLETE - 3+ VIEW COMPARISON:  None. FINDINGS: Bony demineralization. Small marginal articular erosion along the base of the middle phalanx small toe. Lisfranc joint alignment normal. No bony destructive findings are identified. Thickened distal Achilles tendon is suggested, with Achilles calcaneal spurring and a Haglund deformity. Plantar calcaneal spur. Subcutaneous edema along the plantar heel. IMPRESSION: 1. Bony demineralization. No bony destructive findings identified to suggest osteomyelitis. No gas tracking in the soft tissues. 2. Probable distal Achilles tendinopathy with Achilles calcaneal spur and Haglund deformity. 3. Plantar calcaneal spurring 4. Subcutaneous edema along the plantar heel. 5. Small marginal erosion in the small toe may reflect low-grade underlying erosive arthropathy . Electronically Signed   By: Van Clines M.D.   On: 05/22/2016 19:03    Scheduled Meds: . [COMPLETED] sodium chloride   Intravenous STAT  . sodium chloride   Intravenous STAT  . Chlorhexidine Gluconate Cloth  6 each Topical Q0600  . citalopram  20 mg Oral q morning - 10a  . donepezil  10 mg Oral QHS  . gabapentin  300 mg Oral BID  . mupirocin ointment  1 application Nasal BID  . pantoprazole  40 mg Oral Daily  . piperacillin-tazobactam (ZOSYN)  IV  3.375 g Intravenous Q8H  .  rOPINIRole  1 mg Oral QHS  . vancomycin  1,000 mg Intravenous Q24H   Continuous Infusions:    LOS: 1 day   CHIU, Orpah Melter, MD [facility] Pager [phone]  If 7PM-7AM, please contact night-coverage [email or web address] Password [email or web address] 05/23/2016, 1:56 PM

## 2016-05-23 NOTE — ED Notes (Signed)
Assisted Nurse Baldo Ash with in and out cath on patient.

## 2016-05-23 NOTE — H&P (Addendum)
TRH H&P   Patient Demographics:    [name] Walker, is a 82 y.o. female  MRN: [id number]  DOB - 08-26-1925  Admit Date - 05/23/2016  Outpatient Primary MD for the patient is Claretta Fraise, MD  Referring MD/NP/PA: Dr. Leonides Schanz  Patient coming from: Home  Chief Complaint  Patient presents with  . Loss of Consciousness      HPI:    [name] Walker  is a 82 y.o. female, With history of hypertension, atrial fibrillation on anticoagulation with Coumadin, COPD, previous history of DVT was brought to the hospital by EMS after a fall. Patient was obviously seen in the ED for complaints of right lower extremity swelling, ulcer on right ankle, patient was given 1 dose of vancomycin and morphine 4 mg IV 1 patient was discharged on Bactrim.  At home patient became very weak was unable to stand and fell. Though she denies passing out. She denies chest pain, no shortness of breath. No nausea vomiting or diarrhea. In the ED patient found to be hypotensive with blood pressure 73/53, required IV fluid boluses. Lactic acid 1.39  CT cervical spine showed Minimally displaced fracture of the anterior superior corner of the C5 vertebral body, neurosurgery  Dr Cyndy Freeze was consulted by the ED physician, who recommended no cervical collar no need of outpatient follow-up or MRI.   Review of systems:    In addition to the HPI above,   No Headache, No changes with Vision or hearing, No problems swallowing food or Liquids, No Chest pain, Cough or Shortness of Breath, No Abdominal pain, No Nausea or Vomiting, bowel movements are regular, No Blood in stool or Urine, No dysuria, No new skin rashes or bruises, No new joints pains-aches,  No new weakness, tingling, numbness in any extremity, No recent weight gain or loss, No polyuria, polydypsia or polyphagia, No significant Mental Stressors.  A full 10 point Review of  Systems was done, except as stated above, all other Review of Systems were negative.   With Past History of the following :    Past Medical History:  Diagnosis Date  . Cataract   . COPD (chronic obstructive pulmonary disease) ([place])   . Depression   . DVT (deep venous thrombosis) ([place])   . Gastric erosion   . GERD (gastroesophageal reflux disease)   . Hypertension   . Insomnia   . Ischemic colitis ([place])   . Osteoarthritis   . Parotid mass 2016  . 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 ABDOMINAL HYSTERECTOMY    . BACK SURGERY    . Cataracts    . CHOLECYSTECTOMY    . COLONOSCOPY    . ESOPHAGOGASTRODUODENOSCOPY  10/17/2007   erosion   . KNEE ARTHROPLASTY     bilateral  . PAROTIDECTOMY Left 05/17/2015   Procedure: PAROTIDECTOMY- LEFT TOTAL;  Surgeon: Leta Baptist, MD;  Location: [facility];  Service: ENT;  Laterality: Left;  .  TOTAL HIP ARTHROPLASTY     bilateral      Social History:     Social History  Substance Use Topics  . Smoking status: Never Smoker  . Smokeless tobacco: Never Used  . Alcohol use No        Family History :     Family History  Problem Relation Age of Onset  . Rheum arthritis Mother   . Ulcers Mother     on foot  . Ulcers Father   . Cancer Sister     uterine      Home Medications:   Prior to Admission medications   Medication Sig Start Date End Date Taking? Authorizing Provider  albuterol (PROVENTIL) (2.5 MG/3ML) 0.083% nebulizer solution Take 3 mLs (2.5 mg total) by nebulization every 2 (two) hours as needed for wheezing or shortness of breath. 07/31/15   Erline Hau, MD  amitriptyline (ELAVIL) 75 MG tablet TAKE 1 TABLETS AT BEDTIME 05/02/16   Claretta Fraise, MD  Calcium Carbonate-Vitamin D (CALCIUM-VITAMIN D) 500-200 MG-UNIT tablet Take 1 tablet by mouth every morning.    Historical Provider, MD  citalopram (CELEXA) 20 MG  tablet TAKE 1 TABLET DAILY Patient taking differently: TAKE 1 TABLET DAILY IN THE MORNING 04/04/16   Claretta Fraise, MD  donepezil (ARICEPT) 10 MG tablet TAKE ONE TABLET AT BEDTIME 04/16/16   Claretta Fraise, MD  furosemide (LASIX) 40 MG tablet TAKE (2) TABLETS DAILY Patient taking differently: TAKE ONE TABLET BY MOUTH TWICE DAILY 04/26/16   Claretta Fraise, MD  gabapentin (NEURONTIN) 300 MG capsule TAKE (1) CAPSULE TWICE DAILY. 05/02/16   Claretta Fraise, MD  hydrOXYzine (ATARAX/VISTARIL) 10 MG tablet TAKE (1) TABLET THREE TIMES DAILY. 05/08/16   Claretta Fraise, MD  levocetirizine (XYZAL) 5 MG tablet Take 1 tablet (5 mg total) by mouth at bedtime. 12/21/15   Claretta Fraise, MD  omeprazole (PRILOSEC) 20 MG capsule TAKE 2 CAPSULES DAILY AS NEEDED FOR REFLUX Patient taking differently: TAKE 2 CAPSULES DAILY IN THE MORNING 01/10/16   Claretta Fraise, MD  OXYGEN Inhale into the lungs at bedtime.    Historical Provider, MD  potassium chloride (K-DUR) 10 MEQ tablet TAKE 4 TABLETS ONCE DAILY Patient taking differently: TAKE 4 TABLETS ONCE DAILY IN THE MORNING 05/08/16   Claretta Fraise, MD  rOPINIRole (REQUIP) 1 MG tablet TAKE 1 OR 2 TABLETS BY MOUTH AT BEDTIME 12/22/15   Chipper Herb, MD  sulfamethoxazole-trimethoprim (BACTRIM DS,SEPTRA DS) 800-160 MG tablet Take 2 tablets by mouth 2 (two) times daily. 05/22/16   Tanna Furry, MD  traMADol (ULTRAM) 50 MG tablet Take 1 tablet (50 mg total) by mouth every 6 (six) hours as needed. 05/22/16   Tanna Furry, MD  triamcinolone cream (KENALOG) 0.1 % Apply 1 application topically daily. 05/15/16   Claretta Fraise, MD  verapamil (CALAN-SR) 180 MG CR tablet Take 1 tablet (180 mg total) by mouth at bedtime. 04/06/16   Claretta Fraise, MD  Vitamin D-Vitamin K (VITAMIN K2-VITAMIN D3 PO) Take 2 capsules by mouth every morning.    Historical Provider, MD  warfarin (COUMADIN) 2.5 MG tablet Take 1 tablet (2.5 mg total) by mouth daily. Patient taking differently: Take 2.5-5 mg by mouth daily. Take  one-half tablets on MWF, and take one tablet on all other days. 05/22/16   Cherre Robins, PharmD     Allergies:     Allergies  Allergen Reactions  . Diltiazem Anxiety and Other (See Comments)    Sleep disturbance  . Iohexol  Desc: THROAT SWELLING-NOTED IN CHART AFTER IVC PLACEMENT-ARS 12-02-05   . Latex Other (See Comments)    unknown  . Penicillins Swelling  . Shellfish Allergy Other (See Comments)    Reaction unknown     Physical Exam:   Vitals  Blood pressure (!) 95/48, pulse 61, temperature 97.7 F (36.5 C), temperature source Rectal, resp. rate 14, height 4\' 9"  (1.448 m), weight 77.1 kg (170 lb), SpO2 100 %.   1. General Elderly female lying in bed in NAD, cooperative with exam  2. Normal affect and insight, Awake Alert, Oriented X 3.  3. No F.N deficits, ALL C.Nerves Intact, Strength 5/5 all 4 extremities, Sensation intact all 4 extremities, Plantars down going.  4. Ears and Eyes appear Normal, Conjunctivae clear, PERRLA. Moist Oral Mucosa.  5. Supple Neck, No JVD, No cervical lymphadenopathy appriciated, No Carotid Bruits.  6. Symmetrical Chest wall movement, Good air movement bilaterally, CTAB.  7. RRR, No Gallops, Rubs or Murmurs, No Parasternal Heave.1+ pitting edema for 2 extremity  8. Positive Bowel Sounds, Abdomen Soft, No tenderness, No organomegaly appriciated,No rebound -guarding or rigidity.  9.  Large ulcer noted at medial malleolus, with surrounding erythema, warmth involving the foot, tenderness to palpation.  10. Good muscle tone,  joints appear normal , no effusions, Normal ROM.      Data Review:    CBC  Recent Labs Lab 05/22/16 1857 05/23/16 0055  WBC 15.0* 12.0*  HGB 10.6* 9.6*  HCT 31.8* 28.9*  PLT 203 189  MCV 93.5 93.5  MCH 31.2 31.1  MCHC 33.3 33.2  RDW 13.8 13.7  LYMPHSABS 1.4 0.8  MONOABS 1.5* 1.4*  EOSABS 0.2 0.3  BASOSABS 0.0 0.0    ------------------------------------------------------------------------------------------------------------------  Chemistries   Recent Labs Lab 05/22/16 1857 05/23/16 0055  NA 138 138  K 2.9* 3.1*  CL 100* 102  CO2 28 28  GLUCOSE 93 132*  BUN 21* 21*  CREATININE 1.16* 1.35*  CALCIUM 9.1 8.5*  AST  --  23  ALT  --  11*  ALKPHOS  --  94  BILITOT  --  1.0   ------------------------------------------------------------------------------------------------------------------  ------------------------------------------------------------------------------------------------------------------ GFR: Estimated Creatinine Clearance: 24.6 mL/min (by C-G formula based on SCr of 1.35 mg/dL). Liver Function Tests:  Recent Labs Lab 05/23/16 0055  AST 23  ALT 11*  ALKPHOS 94  BILITOT 1.0  PROT 6.3*  ALBUMIN 3.1*   No results for input(s): LIPASE, AMYLASE in the last 168 hours. No results for input(s): AMMONIA in the last 168 hours. Coagulation Profile:  Recent Labs Lab 05/22/16 1236 05/23/16 0055  INR 5.3* 4.33*   Cardiac Enzymes:  Recent Labs Lab 05/23/16 0055  CKTOTAL 172  TROPONINI 0.03*   BNP (last 3 results) No results for input(s): PROBNP in the last 8760 hours. HbA1C: No results for input(s): HGBA1C in the last 72 hours. CBG:  Recent Labs Lab 05/23/16 0056  GLUCAP 129*   Lipid Profile: No results for input(s): CHOL, HDL, LDLCALC, TRIG, CHOLHDL, LDLDIRECT in the last 72 hours. Thyroid Function Tests: No results for input(s): TSH, T4TOTAL, FREET4, T3FREE, THYROIDAB in the last 72 hours. Anemia Panel: No results for input(s): VITAMINB12, FOLATE, FERRITIN, TIBC, IRON, RETICCTPCT in the last 72 hours.  --------------------------------------------------------------------------------------------------------------- Urine analysis:    Component Value Date/Time   COLORURINE YELLOW 05/23/2016 0140   APPEARANCEUR CLEAR 05/23/2016 0140   LABSPEC 1.015  05/23/2016 0140   PHURINE 5.5 05/23/2016 0140   GLUCOSEU NEGATIVE 05/23/2016 0140   HGBUR TRACE (A) 05/23/2016 0140  BILIRUBINUR NEGATIVE 05/23/2016 0140   KETONESUR NEGATIVE 05/23/2016 0140   PROTEINUR TRACE (A) 05/23/2016 0140   UROBILINOGEN 0.2 01/19/2013 0040   NITRITE NEGATIVE 05/23/2016 0140   LEUKOCYTESUR NEGATIVE 05/23/2016 0140      ----------------------------------------------------------------------------------------------------------------   Imaging Results:    Dg Ankle Complete Right  Result Date: 05/22/2016 CLINICAL DATA:  RIGHT ankle foot and drainage for 1 month. Medial ankle ulcer. EXAM: RIGHT ANKLE - COMPLETE 3+ VIEW COMPARISON:  RIGHT ankle radiograph March 10, 2014 FINDINGS: There is no evidence of fracture, dislocation, or joint effusion. There is no evidence of arthropathy or other focal bone abnormality. Moderate medial ankle soft tissue swelling though, decreased from prior examination. Focal median ankle subcutaneous gas most compatible with ulcer. No radiopaque foreign bodies. IMPRESSION: Medial ankle soft tissue swelling and ulceration. No acute osseous process. Electronically Signed   By: Elon Alas M.D.   On: 05/22/2016 19:06   Ct Head Wo Contrast  Result Date: 05/23/2016 CLINICAL DATA:  Atrial fibrillation. On anti coagulation. Status post fall. EXAM: CT HEAD WITHOUT CONTRAST CT CERVICAL SPINE WITHOUT CONTRAST TECHNIQUE: Multidetector CT imaging of the head and cervical spine was performed following the standard protocol without intravenous contrast. Multiplanar CT image reconstructions of the cervical spine were also generated. COMPARISON:  Head CT 04/18/2015 and neck CT 04/18/2015 FINDINGS: CT HEAD FINDINGS There is no mass lesion, intraparenchymal hemorrhage or extra-axial collection. No evidence of acute cortical infarct. No hydrocephalus. 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 Brain parenchyma otherwise normal for age. Normal ventricles and sulci.  The paranasal sinuses and mastoids are free of fluid. The orbits are normal. Normal skull and visualized extracranial soft tissues. CT CERVICAL SPINE FINDINGS There is a minimally displaced fracture of the anterior superior corner of the C5 vertebral body (series 8, image 39). No static subluxation. Facets are normally aligned. The occipital condyles are aligned. The dens is intact.There is extensive facet arthrosis with moderate to severe narrowing of the neural foramina at multiple levels. No advanced spinal canal stenosis.Visualized paraspinal soft tissues and lung apices are normal. IMPRESSION: 1. Minimally displaced fracture of the anterior superior corner of the C5 vertebral body. This is somewhat age indeterminate, but was not present on neck CT of 04/18/2015 and is favored to be acute. 2. No acute intracranial abnormality. Electronically Signed   By: Ulyses Jarred M.D.   On: 05/23/2016 02:22   Ct Cervical Spine Wo Contrast  Result Date: 05/23/2016 CLINICAL DATA:  Atrial fibrillation. On anti coagulation. Status post fall. EXAM: CT HEAD WITHOUT CONTRAST CT CERVICAL SPINE WITHOUT CONTRAST TECHNIQUE: Multidetector CT imaging of the head and cervical spine was performed following the standard protocol without intravenous contrast. Multiplanar CT image reconstructions of the cervical spine were also generated. COMPARISON:  Head CT 04/18/2015 and neck CT 04/18/2015 FINDINGS: CT HEAD FINDINGS There is no mass lesion, intraparenchymal hemorrhage or extra-axial collection. No evidence of acute cortical infarct. No hydrocephalus. 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 Brain parenchyma otherwise normal for age. Normal ventricles and sulci. The paranasal sinuses and mastoids are free of fluid. The orbits are normal. Normal skull and visualized extracranial soft tissues. CT CERVICAL SPINE FINDINGS There is a minimally displaced fracture of the anterior superior corner of the C5 vertebral body (series 8, image 39).  No static subluxation. Facets are normally aligned. The occipital condyles are aligned. The dens is intact.There is extensive facet arthrosis with moderate to severe narrowing of the neural foramina at multiple levels. No advanced spinal canal stenosis.Visualized  paraspinal soft tissues and lung apices are normal. IMPRESSION: 1. Minimally displaced fracture of the anterior superior corner of the C5 vertebral body. This is somewhat age indeterminate, but was not present on neck CT of 04/18/2015 and is favored to be acute. 2. No acute intracranial abnormality. Electronically Signed   By: Ulyses Jarred M.D.   On: 05/23/2016 02:22   Dg Chest Portable 1 View  Result Date: 05/23/2016 CLINICAL DATA:  [place] at home tonight.  Found on floor. EXAM: PORTABLE CHEST 1 VIEW COMPARISON:  07/28/2015 FINDINGS: There is mild unchanged cardiomegaly. The lungs are clear. There is no large effusion. Mediastinal and hilar contours are unremarkable and unchanged. Incidental findings include chronic arthropathy of both shoulders. IMPRESSION: Unchanged cardiomegaly.  No acute findings. Electronically Signed   By: Andreas Newport M.D.   On: 05/23/2016 01:40   Dg Foot Complete Right  Result Date: 05/22/2016 CLINICAL DATA:  Right foot and ankle pain. Drainage for over a month from the medial ankle. EXAM: RIGHT FOOT COMPLETE - 3+ VIEW COMPARISON:  None. FINDINGS: Bony demineralization. Small marginal articular erosion along the base of the middle phalanx small toe. Lisfranc joint alignment normal. No bony destructive findings are identified. Thickened distal Achilles tendon is suggested, with Achilles calcaneal spurring and a Haglund deformity. Plantar calcaneal spur. Subcutaneous edema along the plantar heel. IMPRESSION: 1. Bony demineralization. No bony destructive findings identified to suggest osteomyelitis. No gas tracking in the soft tissues. 2. Probable distal Achilles tendinopathy with Achilles calcaneal spur and Haglund  deformity. 3. Plantar calcaneal spurring 4. Subcutaneous edema along the plantar heel. 5. Small marginal erosion in the small toe may reflect low-grade underlying erosive arthropathy . Electronically Signed   By: Van Clines M.D.   On: 05/22/2016 19:03    My personal review of EKG: Rhythm is atrial fibrillation   Assessment & Plan:    Active Problems:   Atrial fibrillation, chronic (HCC)   Hypotension   Sepsis (HCC)   Hypokalemia   1. Sepsis- likely source right lower extremity cellulitis, we'll start vancomycin and Zosyn per pharmacy consultation. Blood cultures 2 have been drawn. Monitor the patient on stepdown 2. Hypokalemia- replace potassium and check BMP in a.m. 3. Chronic atrial fibrillation-heart rate is controlled, will hold verapamil at this time due to hypotension. Once blood pressure improves consider restarting verapamil. Consult pharmacy for dosing of Coumadin. 4. Minimally displaced fracture of the anterior superior corner of the C5 vertebral body- neurosurgery was consulted by Dr. Leonides Schanz, who recommended no MRI or cervical collar. No need to follow-up as outpatient. 5. Status post fall- patient denies syncope, likely from morphine patient received in the ED.  Once medically stable stable consider PT evaluation    DVT Prophylaxis-   patient on Coumadin  AM Labs Ordered, also please review Full Orders  Family Communication: No family at bedside  Code Status:  DO NOT RESUSCITATE  Admission status: Observation  Time spent in minutes : 60 minutes   Uday Jantz S M.D on 05/23/2016 at 4:58 AM  Between 7am to 7pm - Pager - [phone]. After 7pm go to [email or web address] - password [facility]  [facility] - Office  [phone]

## 2016-05-24 DIAGNOSIS — E876 Hypokalemia: Secondary | ICD-10-CM

## 2016-05-24 LAB — PROTIME-INR
INR: 3.66
PROTHROMBIN TIME: 37.3 s — AB (ref 11.4–15.2)

## 2016-05-24 LAB — BASIC METABOLIC PANEL
Anion gap: 5 (ref 5–15)
BUN: 18 mg/dL (ref 6–20)
CALCIUM: 8.2 mg/dL — AB (ref 8.9–10.3)
CO2: 25 mmol/L (ref 22–32)
CREATININE: 1.2 mg/dL — AB (ref 0.44–1.00)
Chloride: 109 mmol/L (ref 101–111)
GFR, EST AFRICAN AMERICAN: 45 mL/min — AB (ref >60)
GFR, EST NON AFRICAN AMERICAN: 39 mL/min — AB (ref >60)
Glucose, Bld: 100 mg/dL — ABNORMAL HIGH (ref 65–99)
Potassium: 3.7 mmol/L (ref 3.5–5.1)
SODIUM: 139 mmol/L (ref 135–145)

## 2016-05-24 LAB — CBC
HCT: 30.3 % — ABNORMAL LOW (ref 36.0–46.0)
Hemoglobin: 9.7 g/dL — ABNORMAL LOW (ref 12.0–15.0)
MCH: 31.2 pg (ref 26.0–34.0)
MCHC: 32 g/dL (ref 30.0–36.0)
MCV: 97.4 fL (ref 78.0–100.0)
Platelets: 200 10*3/uL (ref 150–400)
RBC: 3.11 MIL/uL — ABNORMAL LOW (ref 3.87–5.11)
RDW: 14.1 % (ref 11.5–15.5)
WBC: 5 10*3/uL (ref 4.0–10.5)

## 2016-05-24 MED ORDER — CEFAZOLIN IN D5W 1 GM/50ML IV SOLN
1.0000 g | Freq: Three times a day (TID) | INTRAVENOUS | Status: DC
  Filled 2016-05-24 (×5): qty 50

## 2016-05-24 MED ORDER — CEFAZOLIN IN D5W 1 GM/50ML IV SOLN
1.0000 g | Freq: Two times a day (BID) | INTRAVENOUS | Status: DC
  Administered 2016-05-24 – 2016-05-25 (×3): 1 g via INTRAVENOUS
  Filled 2016-05-24 (×9): qty 50

## 2016-05-24 MED ORDER — MORPHINE SULFATE (PF) 2 MG/ML IV SOLN
2.0000 mg | INTRAVENOUS | Status: DC | PRN
  Administered 2016-05-24: 2 mg via INTRAVENOUS
  Filled 2016-05-24: qty 1

## 2016-05-24 MED ORDER — MUPIROCIN CALCIUM 2 % EX CREA
TOPICAL_CREAM | Freq: Every day | CUTANEOUS | Status: DC
  Administered 2016-05-24 – 2016-05-25 (×2): via TOPICAL
  Filled 2016-05-24: qty 15

## 2016-05-24 MED ORDER — CEFAZOLIN IN D5W 1 GM/50ML IV SOLN
INTRAVENOUS | Status: AC
  Filled 2016-05-24: qty 50

## 2016-05-24 NOTE — Progress Notes (Signed)
ANTICOAGULATION CONSULT NOTE -  Pharmacy Consult for WARFARIN (chronic Rx PTA) Indication: atrial fibrillation  Allergies  Allergen Reactions  . Diltiazem Anxiety and Other (See Comments)    Sleep disturbance  . Iohexol      Desc: THROAT SWELLING-NOTED IN CHART AFTER IVC PLACEMENT-ARS 12-02-05   . Latex Other (See Comments)    unknown  . Penicillins Swelling  . Shellfish Allergy Other (See Comments)    Reaction unknown   Patient Measurements: Height: 4\' 9"  (144.8 cm) Weight: 180 lb 8.9 oz (81.9 kg) IBW/kg (Calculated) : 38.6  Vital Signs: Temp: 96.8 F (36 C) (08/31 0400) Temp Source: Oral (08/31 0400) BP: 109/61 (08/31 0800) Pulse Rate: 80 (08/31 0800)  Labs:  Recent Labs  05/22/16 1236  05/23/16 0055 05/23/16 0537 05/23/16 0830 05/23/16 1204 05/24/16 0452  HGB  --   < > 9.6* 9.2*  --   --  9.7*  HCT  --   < > 28.9* 28.3*  --   --  30.3*  PLT  --   < > 189 188  --   --  200  LABPROT  --   --  42.6*  --   --   --  37.3*  INR 5.3*  --  4.33*  --   --   --  3.66  CREATININE  --   < > 1.35* 1.19*  --   --  1.20*  CKTOTAL  --   --  172  --   --   --   --   TROPONINI  --   < > 0.03* <0.03 <0.03 <0.03  --   < > = values in this interval not displayed.  Estimated Creatinine Clearance: 28.6 mL/min (by C-G formula based on SCr of 1.2 mg/dL).   Medical History: Past Medical History:  Diagnosis Date  . Cataract   . COPD (chronic obstructive pulmonary disease) ([place])   . Depression   . DVT (deep venous thrombosis) ([place])   . Gastric erosion   . GERD (gastroesophageal reflux disease)   . Hypertension   . Insomnia   . Ischemic colitis ([place])   . Osteoarthritis   . Parotid mass 2016  . 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 albuterol (PROVENTIL) (2.5 MG/3ML) 0.083% nebulizer solution Take 3 mLs (2.5 mg total) by nebulization every 2 (two)  hours as needed for wheezing or shortness of breath. 75 mL 12 unknown  . amitriptyline (ELAVIL) 75 MG tablet TAKE 1 TABLETS AT BEDTIME 30 tablet 2 05/21/2016  . Calcium Carbonate-Vitamin D (CALCIUM-VITAMIN D) 500-200 MG-UNIT tablet Take 1 tablet by mouth every morning.   05/21/2016  . citalopram (CELEXA) 20 MG tablet TAKE 1 TABLET DAILY (Patient taking differently: TAKE 1 TABLET DAILY IN THE MORNING) 30 tablet 0 05/21/2016  . donepezil (ARICEPT) 10 MG tablet TAKE ONE TABLET AT BEDTIME 90 tablet 1 05/21/2016  . furosemide (LASIX) 40 MG tablet TAKE (2) TABLETS DAILY (Patient taking differently: TAKE ONE TABLET BY MOUTH TWICE DAILY) 60 tablet 0 05/21/2016  . gabapentin (NEURONTIN) 300 MG capsule TAKE (1) CAPSULE TWICE DAILY. 60 capsule 3 05/21/2016  . hydrOXYzine (ATARAX/VISTARIL) 10 MG tablet TAKE (1) TABLET THREE TIMES DAILY. 90 tablet 2 05/21/2016  . ibuprofen (ADVIL,MOTRIN) 200 MG tablet Take 200 mg by mouth every 6 (six) hours as needed for moderate pain.   unknown  . levocetirizine (XYZAL) 5  MG tablet Take 1 tablet (5 mg total) by mouth at bedtime. 90 tablet 2 05/21/2016  . omeprazole (PRILOSEC) 20 MG capsule TAKE 2 CAPSULES DAILY AS NEEDED FOR REFLUX (Patient taking differently: TAKE 2 CAPSULES DAILY IN THE MORNING) 60 capsule 3 05/21/2016  . potassium chloride (K-DUR) 10 MEQ tablet TAKE 4 TABLETS ONCE DAILY (Patient taking differently: TAKE 4 TABLETS ONCE DAILY IN THE MORNING) 120 tablet 4 05/21/2016  . rOPINIRole (REQUIP) 1 MG tablet TAKE 1 OR 2 TABLETS BY MOUTH AT BEDTIME 56 tablet 3 05/21/2016  . verapamil (CALAN-SR) 180 MG CR tablet Take 1 tablet (180 mg total) by mouth at bedtime. 30 tablet 2 05/21/2016  . Vitamin D-Vitamin K (VITAMIN K2-VITAMIN D3 PO) Take 2 capsules by mouth every morning.   05/21/2016  . warfarin (COUMADIN) 2.5 MG tablet Take 1 tablet (2.5 mg total) by mouth daily. 30 tablet 1 05/21/2016 at 0930  . OXYGEN Inhale into the lungs at bedtime.     . triamcinolone cream (KENALOG) 0.1 %  Apply 1 application topically daily. 30 g 1 05/21/2016    Assessment: 82yo female on chronic Coumadin PTA.  Home dose listed above.  Pt brought to hospital after a fall.  CT cervical spine showed minimally displaced fracture of C5 vertebral body.  INR 3.66 today  Goal of Therapy:  INR 2-3 Monitor platelets by anticoagulation protocol: Yes   Plan:  HOLD coumadin today Allow INR to trend down toward goal range INR daily, monitor CBC, s/sx of bleeding complications  Isac Sarna, BS [place], BCPS Clinical Pharmacist Pager [phone] 05/24/2016,9:01 AM

## 2016-05-24 NOTE — Consult Note (Addendum)
[facility] Nurse wound consult note Reason for Consult: Consult requested for right ankle and abd/breast skin folds. This was performed using a remote camera and assistance from the bedside nurse for assessment and measurements. Wound type: Right inner ankle with chronic full thickness wound.  She was previously followed by the outpatient wound care center in [place], but patient states she had to stop going for some reason. Abd/breast skin folds with generalized erythremia and moist red maceration in patchy areas.  Appearance consistent with intertrigo. Measurement: Right ankle 1X2.5X.2cm, 90% red, 10% yellow, no odor, small amt yellow drainage.  There is a partial thickness wound located nearby; .2X.2X.1cm, red and dry. Periwound: Generalized edema and erythremia to bilat legs.  Pt states she wears compression stockings when her legs are not swollen, but they are currently too large to fit into the stockings. Dressing procedure/placement/frequency: Interdry silver-impregnated fabric to promote healing to inner abd/breast skin folds. This should remain in place for 5 days optimal plan of care to promote healing.  Instructions provided for staff nurse use. Bactroban and dressing to promote moist healing to right ankle. Discussed plan of care with patient and bedside nurse; she verbalized understanding. Please re-consult if further assistance is needed.  Thank-you,  Julien Girt MSN, [place], [place], [place], [place]

## 2016-05-24 NOTE — Progress Notes (Signed)
PROGRESS NOTE    [name] Walker  [id number] DOB: [birth year] DOA: 05/23/2016 PCP: Claretta Fraise, MD    Brief Narrative:  82 y.o. female, With history of hypertension, atrial fibrillation on anticoagulation with Coumadin, COPD, previous history of DVT was brought to the hospital by EMS after a fall. Patient was obviously seen in the ED for complaints of right lower extremity swelling, ulcer on right ankle, patient was given 1 dose of vancomycin and morphine 4 mg IV 1 patient was discharged on Bactrim.  At home patient became very weak was unable to stand and fell. Though she denies passing out. She denies chest pain, no shortness of breath. No nausea vomiting or diarrhea. In the ED patient found to be hypotensive with blood pressure 73/53, required IV fluid boluses. Lactic acid 1.39  CT cervical spine showed Minimally displaced fracture of the anterior superior corner of the C5 vertebral body, neurosurgery  Dr Cyndy Freeze was consulted by the ED physician, who recommended no cervical collar no need of outpatient follow-up or MRI  Assessment & Plan:   Principal Problem:   Sepsis due to cellulitis [facility]) Active Problems:   Essential hypertension   COPD (chronic obstructive pulmonary disease) ([place])   Atrial fibrillation, chronic ([place])   Hypotension   Hypokalemia   Sepsis ([place])   1. Right lower extremity cellulitis with sepsis and septic shock present on admission 1. Right lower extremity with increased redness, tenderness and swelling 2. Patient presented with hypothermia and mild leukocytosis of 11.8 thousand with hypotension 3. Hypotension improved with IV fluid hydration 4. Blood pressure medications on hold for the time being 5. Patient had been on empiric vancomycin and zosyn, tolerating well 6. Question patient's "penicilin allergy" as pt is tolerating zosyn. Discussed with Pharmacy. Recs for transition to ancef for moderate nonpurulent cellulitis. 7. If patient continues to  improve, consider transition to keflex to complete course 2. Hypokalemia 1. Replaced 2. Will check bmet in AM 3. Chronic atrial fibrillation 1. Presently rate controlled. 2. Continue to monitor on telemetry 3. On Coumadin for secondary stroke prevention, dosing per pharmacy 4. Minimally displaced fracture of the anterior superior corner of the C5 vertebral body 1. See resulting H&P 2. Case was discussed with neurosurgery on call on admission 3. Supportive care for now 5. Status post fall 1. Seen by PT. Recs for home health PT 6. Hypertension 1. Patient presented in presumed septic shock, improved with IV fluids 2. Lasix, verapamil currently on hold 3. BP improving  DVT prophylaxis: Coumadin Code Status: DO NOT RESUSCITATE Family Communication: Patient in room, family not at bedside Disposition Plan: Transfer to floor  Consultants:     Procedures:     Antimicrobials: Anti-infectives    Start     Dose/Rate Route Frequency Ordered Stop   05/24/16 0930  ceFAZolin (ANCEF) IVPB 1 g/50 mL premix    Comments:  Discussed with pharmacy. Tolerating zosyn.   1 g 100 mL/hr over 30 Minutes Intravenous Every 12 hours 05/24/16 0916     05/24/16 0915  ceFAZolin (ANCEF) IVPB 1 g/50 mL premix  Status:  Discontinued    Comments:  Discussed with pharmacy. Tolerating zosyn.   1 g 100 mL/hr over 30 Minutes Intravenous Every 8 hours 05/24/16 0905 05/24/16 0916   05/23/16 1600  vancomycin (VANCOCIN) IVPB 1000 mg/200 mL premix  Status:  Discontinued     1,000 mg 200 mL/hr over 60 Minutes Intravenous Every 24 hours 05/23/16 0741 05/24/16 0905   05/23/16 0215  piperacillin-tazobactam (ZOSYN) IVPB  3.375 g  Status:  Discontinued     3.375 g 12.5 mL/hr over 240 Minutes Intravenous Every 8 hours 05/23/16 0201 05/24/16 0905      Subjective: No complaints. Eager to go home  Objective: Vitals:   05/24/16 0800 05/24/16 0900 05/24/16 1221 05/24/16 1300  BP: 109/61 121/68  132/74  Pulse: 80 84   78  Resp: 12 13  17   Temp:   97.4 F (36.3 C)   TempSrc:   Oral   SpO2: 95% 97%  99%  Weight:      Height:        Intake/Output Summary (Last 24 hours) at 05/24/16 1751 Last data filed at 05/24/16 1100  Gross per 24 hour  Intake          2041.67 ml  Output              400 ml  Net          1641.67 ml   Filed Weights   05/23/16 0059 05/23/16 0543 05/24/16 0500  Weight: 77.1 kg (170 lb) 81.9 kg (180 lb 8.9 oz) 81.9 kg (180 lb 8.9 oz)    Examination:  General exam: Appears calm and comfortable, Lying in bed, awake, oriented Respiratory system: Clear to auscultation. Respiratory effort normal. Cardiovascular system: S1 & S2 heard, RRR. No JVD, murmurs, rubs, gallops or clicks. No pedal edema. Gastrointestinal system: Abdomen is nondistended, soft and nontender. No organomegaly or masses felt. Normal bowel sounds heard. Central nervous system: Alert and oriented. No focal neurological deficits. Extremities: Symmetric 5 x 5 power. Skin: Bilateral lower extremities with chronic venous stasis changes. Right lower extremity with increased redness swelling and pain on palpation. Inner aspect of right ankle with open nondraining wound Psychiatry: Judgement and insight appear normal. Mood & affect appropriate.   Data Reviewed: I have personally reviewed following labs and imaging studies  CBC:  Recent Labs Lab 05/22/16 1857 05/23/16 0055 05/23/16 0537 05/24/16 0452  WBC 15.0* 12.0* 11.8* 5.0  NEUTROABS 11.9* 9.5*  --   --   HGB 10.6* 9.6* 9.2* 9.7*  HCT 31.8* 28.9* 28.3* 30.3*  MCV 93.5 93.5 96.9 97.4  PLT 203 189 188 [id number]   Basic Metabolic Panel:  Recent Labs Lab 05/22/16 1857 05/23/16 0055 05/23/16 0537 05/24/16 0452  NA 138 138 141 139  K 2.9* 3.1* 3.4* 3.7  CL 100* 102 106 109  CO2 28 28 26 25   GLUCOSE 93 132* 123* 100*  BUN 21* 21* 20 18  CREATININE 1.16* 1.35* 1.19* 1.20*  CALCIUM 9.1 8.5* 7.8* 8.2*   GFR: Estimated Creatinine Clearance: 28.6 mL/min (by  C-G formula based on SCr of 1.2 mg/dL). Liver Function Tests:  Recent Labs Lab 05/23/16 0055 05/23/16 0537  AST 23 32  ALT 11* 16  ALKPHOS 94 104  BILITOT 1.0 0.8  PROT 6.3* 5.8*  ALBUMIN 3.1* 2.9*   No results for input(s): LIPASE, AMYLASE in the last 168 hours. No results for input(s): AMMONIA in the last 168 hours. Coagulation Profile:  Recent Labs Lab 05/22/16 1236 05/23/16 0055 05/24/16 0452  INR 5.3* 4.33* 3.66   Cardiac Enzymes:  Recent Labs Lab 05/23/16 0055 05/23/16 0537 05/23/16 0830 05/23/16 1204  CKTOTAL 172  --   --   --   TROPONINI 0.03* <0.03 <0.03 <0.03   BNP (last 3 results) No results for input(s): PROBNP in the last 8760 hours. HbA1C: No results for input(s): HGBA1C in the last 72 hours. CBG:  Recent Labs  Lab 05/23/16 0056  GLUCAP 129*   Lipid Profile: No results for input(s): CHOL, HDL, LDLCALC, TRIG, CHOLHDL, LDLDIRECT in the last 72 hours. Thyroid Function Tests: No results for input(s): TSH, T4TOTAL, FREET4, T3FREE, THYROIDAB in the last 72 hours. Anemia Panel: No results for input(s): VITAMINB12, FOLATE, FERRITIN, TIBC, IRON, RETICCTPCT in the last 72 hours. Sepsis Labs:  Recent Labs Lab 05/23/16 0158 05/23/16 0517 05/23/16 0537 05/23/16 0806  LATICACIDVEN 1.39 0.61 0.6 0.6    Recent Results (from the past 240 hour(s))  Blood culture (routine x 2)     Status: None (Preliminary result)   Collection Time: 05/23/16  1:25 AM  Result Value Ref Range Status   Specimen Description BLOOD LEFT FOREARM  Final   Special Requests BOTTLES DRAWN AEROBIC AND ANAEROBIC 4CC  Final   Culture NO GROWTH 1 DAY  Final   Report Status PENDING  Incomplete  Blood culture (routine x 2)     Status: None (Preliminary result)   Collection Time: 05/23/16  1:30 AM  Result Value Ref Range Status   Specimen Description BLOOD LEFT HAND  Final   Special Requests BOTTLES DRAWN AEROBIC AND ANAEROBIC 6CC  Final   Culture NO GROWTH 1 DAY  Final   Report  Status PENDING  Incomplete  MRSA PCR Screening     Status: Abnormal   Collection Time: 05/23/16  5:23 AM  Result Value Ref Range Status   MRSA by PCR POSITIVE (A) NEGATIVE Final    Comment:        The GeneXpert MRSA Assay (FDA approved for NASAL specimens only), is one component of a comprehensive MRSA colonization surveillance program. It is not intended to diagnose MRSA infection nor to guide or monitor treatment for MRSA infections. RESULT CALLED TO, READ BACK BY AND VERIFIED WITH: BETHEL E. AT [id number] ON [id number] BY THOMPSON S.      Radiology Studies: Dg Ankle Complete Right  Result Date: 05/22/2016 CLINICAL DATA:  RIGHT ankle foot and drainage for 1 month. Medial ankle ulcer. EXAM: RIGHT ANKLE - COMPLETE 3+ VIEW COMPARISON:  RIGHT ankle radiograph March 10, 2014 FINDINGS: There is no evidence of fracture, dislocation, or joint effusion. There is no evidence of arthropathy or other focal bone abnormality. Moderate medial ankle soft tissue swelling though, decreased from prior examination. Focal median ankle subcutaneous gas most compatible with ulcer. No radiopaque foreign bodies. IMPRESSION: Medial ankle soft tissue swelling and ulceration. No acute osseous process. Electronically Signed   By: Elon Alas M.D.   On: 05/22/2016 19:06   Ct Head Wo Contrast  Result Date: 05/23/2016 CLINICAL DATA:  Atrial fibrillation. On anti coagulation. Status post fall. EXAM: CT HEAD WITHOUT CONTRAST CT CERVICAL SPINE WITHOUT CONTRAST TECHNIQUE: Multidetector CT imaging of the head and cervical spine was performed following the standard protocol without intravenous contrast. Multiplanar CT image reconstructions of the cervical spine were also generated. COMPARISON:  Head CT 04/18/2015 and neck CT 04/18/2015 FINDINGS: CT HEAD FINDINGS There is no mass lesion, intraparenchymal hemorrhage or extra-axial collection. No evidence of acute cortical infarct. No hydrocephalus. Old right basal ganglia lacunar  infarct. Brain parenchyma otherwise normal for age. Normal ventricles and sulci. The paranasal sinuses and mastoids are free of fluid. The orbits are normal. Normal skull and visualized extracranial soft tissues. CT CERVICAL SPINE FINDINGS There is a minimally displaced fracture of the anterior superior corner of the C5 vertebral body (series 8, image 39). No static subluxation. Facets are normally aligned. The occipital condyles are aligned.  The dens is intact.There is extensive facet arthrosis with moderate to severe narrowing of the neural foramina at multiple levels. No advanced spinal canal stenosis.Visualized paraspinal soft tissues and lung apices are normal. IMPRESSION: 1. Minimally displaced fracture of the anterior superior corner of the C5 vertebral body. This is somewhat age indeterminate, but was not present on neck CT of 04/18/2015 and is favored to be acute. 2. No acute intracranial abnormality. Electronically Signed   By: Ulyses Jarred M.D.   On: 05/23/2016 02:22   Ct Cervical Spine Wo Contrast  Result Date: 05/23/2016 CLINICAL DATA:  Atrial fibrillation. On anti coagulation. Status post fall. EXAM: CT HEAD WITHOUT CONTRAST CT CERVICAL SPINE WITHOUT CONTRAST TECHNIQUE: Multidetector CT imaging of the head and cervical spine was performed following the standard protocol without intravenous contrast. Multiplanar CT image reconstructions of the cervical spine were also generated. COMPARISON:  Head CT 04/18/2015 and neck CT 04/18/2015 FINDINGS: CT HEAD FINDINGS There is no mass lesion, intraparenchymal hemorrhage or extra-axial collection. No evidence of acute cortical infarct. No hydrocephalus. 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 Brain parenchyma otherwise normal for age. Normal ventricles and sulci. The paranasal sinuses and mastoids are free of fluid. The orbits are normal. Normal skull and visualized extracranial soft tissues. CT CERVICAL SPINE FINDINGS There is a minimally displaced  fracture of the anterior superior corner of the C5 vertebral body (series 8, image 39). No static subluxation. Facets are normally aligned. The occipital condyles are aligned. The dens is intact.There is extensive facet arthrosis with moderate to severe narrowing of the neural foramina at multiple levels. No advanced spinal canal stenosis.Visualized paraspinal soft tissues and lung apices are normal. IMPRESSION: 1. Minimally displaced fracture of the anterior superior corner of the C5 vertebral body. This is somewhat age indeterminate, but was not present on neck CT of 04/18/2015 and is favored to be acute. 2. No acute intracranial abnormality. Electronically Signed   By: Ulyses Jarred M.D.   On: 05/23/2016 02:22   Dg Chest Portable 1 View  Result Date: 05/23/2016 CLINICAL DATA:  [place] at home tonight.  Found on floor. EXAM: PORTABLE CHEST 1 VIEW COMPARISON:  07/28/2015 FINDINGS: There is mild unchanged cardiomegaly. The lungs are clear. There is no large effusion. Mediastinal and hilar contours are unremarkable and unchanged. Incidental findings include chronic arthropathy of both shoulders. IMPRESSION: Unchanged cardiomegaly.  No acute findings. Electronically Signed   By: Andreas Newport M.D.   On: 05/23/2016 01:40   Dg Foot Complete Right  Result Date: 05/22/2016 CLINICAL DATA:  Right foot and ankle pain. Drainage for over a month from the medial ankle. EXAM: RIGHT FOOT COMPLETE - 3+ VIEW COMPARISON:  None. FINDINGS: Bony demineralization. Small marginal articular erosion along the base of the middle phalanx small toe. Lisfranc joint alignment normal. No bony destructive findings are identified. Thickened distal Achilles tendon is suggested, with Achilles calcaneal spurring and a Haglund deformity. Plantar calcaneal spur. Subcutaneous edema along the plantar heel. IMPRESSION: 1. Bony demineralization. No bony destructive findings identified to suggest osteomyelitis. No gas tracking in the soft tissues.  2. Probable distal Achilles tendinopathy with Achilles calcaneal spur and Haglund deformity. 3. Plantar calcaneal spurring 4. Subcutaneous edema along the plantar heel. 5. Small marginal erosion in the small toe may reflect low-grade underlying erosive arthropathy . Electronically Signed   By: Van Clines M.D.   On: 05/22/2016 19:03    Scheduled Meds: . sodium chloride   Intravenous STAT  .  ceFAZolin (ANCEF) IV  1  g Intravenous Q12H  . Chlorhexidine Gluconate Cloth  6 each Topical Q0600  . citalopram  20 mg Oral q morning - 10a  . donepezil  10 mg Oral QHS  . gabapentin  300 mg Oral BID  . mupirocin cream   Topical Daily  . mupirocin ointment  1 application Nasal BID  . pantoprazole  40 mg Oral Daily  . rOPINIRole  1 mg Oral QHS   Continuous Infusions:    LOS: 1 day   Kaicen Desena, Orpah Melter, MD [facility] Pager [phone]  If 7PM-7AM, please contact night-coverage [email or web address] Password [facility] 05/24/2016, 5:51 PM

## 2016-05-24 NOTE — Consult Note (Signed)
   [facility] CM Inpatient Consult   05/24/2016  [place] 1924-10-26 [id number]  Spoke with patient at bedside regarding [facility] services. Patient does not want to participate with [facility] at this time. Patient given [facility] brochure and contact information for future reference, voices appreciation of information.    Inpatient case manager aware that patient offered [facility] case management services but declined.   Of note, [facility] Care Management services would not replace or interfere with any services that are arranged by inpatient case management or social work. For additional questions or referrals please contact:   Royetta Crochet. Laymond Purser, RN, BSN, [facility] Liaison [phone]

## 2016-05-24 NOTE — Evaluation (Signed)
Physical Therapy Evaluation Patient Details Name: [name] Walker MRN: [id number] DOB: [birth year] Today's Date: 05/24/2016   History of Present Illness  82 y.o. female, With history of hypertension, atrial fibrillation on anticoagulation with Coumadin, COPD, previous history of DVT was brought to the hospital by EMS after a fall. Patient was obviously seen in the ED for complaints of right lower extremity swelling, ulcer on right ankle, patient was given 1 dose of vancomycin and morphine 4 mg IV 1 patient was discharged on Bactrim.  At home patient became very weak was unable to stand and fell. Though she denies passing out.  She denies chest pain, no shortness of breath. No nausea vomiting or diarrhea.  In the ED patient found to be hypotensive with blood pressure 73/53, required IV fluid boluses. Lactic acid 1.39.  CT cervical spine showed Minimally displaced fracture of the anterior superior corner of the C5 vertebral body, neurosurgery - Dr Cyndy Freeze was consulted by the ED physician, who recommended no cervical collar no need of outpatient follow-up or MRI.  Dx: Sepsis due to LE cellulitis.  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  Clinical Impression  Pt received in bed, and was agreeable to PT evaluation.  Pt lives alone, but is very independent.  She uses a rollator for ambulation, and is independent with ADL's.  She has family that live on either side and behind her.  Today, she required Min guard for all functional mobility, and was able to transfer bed<>BSC.  She ambulated a short distance due to IV being attached to the bed instead of an IV pole.  Pt will benefit from HHPT upon d/c to continue to progress gait, strength, and balance.       Follow Up Recommendations Home health PT    Equipment  Recommendations       Recommendations for Other Services       Precautions / Restrictions Precautions Precautions: Fall Precaution Comments: Pt states that she felt weak from medications that she had received in the ED.  Restrictions Weight Bearing Restrictions: No      Mobility  Bed Mobility Overal bed mobility: Needs Assistance                Transfers Overall transfer level: Needs assistance Equipment used: Rolling walker (2 wheeled) Transfers: Sit to/from Stand;Stand Pivot Transfers Sit to Stand: Min guard Stand pivot transfers: Min guard          Ambulation/Gait Ambulation/Gait assistance: Min guard Ambulation Distance (Feet): 8 Feet Assistive device: Rolling walker (2 wheeled) Gait Pattern/deviations: Step-through pattern     General Gait Details: Mobility limited due to IV being attached to the bed frame with no IV pole.    Stairs            Wheelchair Mobility    Modified Rankin (Stroke Patients Only)       Balance Overall balance assessment: Needs assistance Sitting-balance support: Bilateral upper extremity supported Sitting balance-Leahy Scale: Fair     Standing balance support: Bilateral upper extremity supported Standing balance-Leahy Scale: Fair                               Pertinent Vitals/Pain Pain Assessment: No/denies pain    Home Living   Living Arrangements:  Alone (Pt states she has a niece that lives on one side, and a nephew that lives on the other, and a dtr that lives behind her.  ) Available Help at Discharge: Family Type of Home: House       Home Layout: One level Home Equipment: Environmental consultant - 4 wheels;Walker - 2 wheels;Bedside commode;Shower seat (Lift chair) Additional Comments: Pt sleeps in the lift chair.     Prior Function     Gait / Transfers Assistance Needed: Pt states that she uses her RW or a Rollator all the time.    ADL's / Homemaking Assistance Needed: Pt reports that she is  independent with dressing and bathing.   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       Hand Dominance   Dominant Hand: Left    Extremity/Trunk Assessment   Upper Extremity Assessment: Overall WFL for tasks assessed           Lower Extremity Assessment: Generalized weakness         Communication   Communication:  (difficult to understand without dentures. )  Cognition Arousal/Alertness: Awake/alert Behavior During Therapy: WFL for tasks assessed/performed Overall Cognitive Status: Within Functional Limits for tasks assessed                      General Comments      Exercises        Assessment/Plan    PT Assessment Patient needs continued PT services  PT Diagnosis Difficulty walking;Generalized weakness   PT Problem List Decreased strength;Decreased activity tolerance;Decreased balance;Decreased mobility;Obesity  PT Treatment Interventions DME instruction;Gait training;Functional mobility training;Therapeutic activities;Therapeutic exercise;Balance training;Patient/family education   PT Goals (Current goals can be found in the Care Plan section) Acute Rehab PT Goals Patient Stated Goal: Pt wants to go home.  PT Goal Formulation: With patient Time For Goal Achievement: 05/31/16 Potential to Achieve Goals: Good    Frequency Min 3X/week   Barriers to discharge Decreased caregiver support      Co-evaluation               End of Session Equipment Utilized During Treatment: Gait belt;Oxygen Activity Tolerance: Patient tolerated treatment well Patient left: in bed;with call bell/phone within reach Nurse Communication: Mobility status    Functional Assessment Tool Used: [employer] "6-clicks"  Functional Limitation: Mobility: Walking and moving around Mobility: Walking and Moving Around Current Status [phone]): At least 20 percent but less than 40 percent impaired, limited  or restricted Mobility: Walking and Moving Around Goal Status [phone]): At least 1 percent but less than 20 percent impaired, limited or restricted    Time: [id number] PT Time Calculation (min) (ACUTE ONLY): 40 min   Charges:   PT Evaluation $PT Eval Low Complexity: 1 Procedure PT Treatments $Therapeutic Activity: 23-37 mins   PT G Codes:   PT G-Codes **NOT FOR INPATIENT CLASS** Functional Assessment Tool Used: [employer] "6-clicks"  Functional Limitation: Mobility: Walking and moving around Mobility: Walking and Moving Around Current Status [phone]): At least 20 percent but less than 40 percent impaired, limited or restricted Mobility: Walking and Moving Around Goal Status [phone]): At least 1 percent but less than 20 percent impaired, limited or restricted    Beth Yoav Okane, PT, DPT X: [phone]

## 2016-05-24 NOTE — Progress Notes (Signed)
Patient is currently eating and requesting to be transferred when done eating. Patient to be transported when she has finished eating.

## 2016-05-24 NOTE — Progress Notes (Signed)
Attempted to call report, nurse is currently discharging a patient and will call back for report.

## 2016-05-24 NOTE — Progress Notes (Signed)
Report called to [facility] on unit 300. Patient to be transported via bed to Rm 338

## 2016-05-24 NOTE — Care Management Note (Signed)
Case Management Note  Patient Details  Name: [name] Walker MRN: [id number] Date of Birth: 09/03/1925  Subjective/Objective:                  Pt admitted with cellulitis. She is from home, lives alone and has strong family support. She has home O2 that she wears at night. She has RW, BSC and lift chair. She sleeps in her chair. PT has recommended HH PT and pt is agreeable. She has used [facility] in the past and would like to use them again. Romualdo Bolk, of [facility], made aware of referral and will obtain pt info from chart.   Action/Plan: Anticipate DC home tomorrow with [facility] RN/PT.   Expected Discharge Date:    05/25/2016              Expected Discharge Plan:  [place]  In-House Referral:  NA  Discharge planning Services  CM Consult  Post Acute Care Choice:  Home Health Choice offered to:  Patient  DME Arranged:    DME Agency:     HH Arranged:  RN, PT [place] Agency:  [place]  Status of Service:  In process, will continue to follow  If discussed at Long Length of Stay Meetings, dates discussed:    Additional Comments:  Sherald Barge, RN 05/24/2016, 2:27 PM

## 2016-05-25 ENCOUNTER — Encounter: Payer: Self-pay | Admitting: Pharmacist Clinician (PhC)/ Clinical Pharmacy Specialist

## 2016-05-25 DIAGNOSIS — S12400A Unspecified displaced fracture of fifth cervical vertebra, initial encounter for closed fracture: Secondary | ICD-10-CM

## 2016-05-25 DIAGNOSIS — R6521 Severe sepsis with septic shock: Secondary | ICD-10-CM

## 2016-05-25 LAB — BASIC METABOLIC PANEL
ANION GAP: 5 (ref 5–15)
BUN: 12 mg/dL (ref 6–20)
CALCIUM: 8.5 mg/dL — AB (ref 8.9–10.3)
CO2: 25 mmol/L (ref 22–32)
Chloride: 110 mmol/L (ref 101–111)
Creatinine, Ser: 0.9 mg/dL (ref 0.44–1.00)
GFR calc Af Amer: >60 mL/min (ref >60)
GFR, EST NON AFRICAN AMERICAN: 55 mL/min — AB (ref >60)
GLUCOSE: 97 mg/dL (ref 65–99)
Potassium: 3.6 mmol/L (ref 3.5–5.1)
Sodium: 140 mmol/L (ref 135–145)

## 2016-05-25 LAB — PROTIME-INR
INR: 2.61
PROTHROMBIN TIME: 28.4 s — AB (ref 11.4–15.2)

## 2016-05-25 MED ORDER — WARFARIN SODIUM 5 MG PO TABS: 2.5000 mg | ORAL_TABLET | Freq: Once | ORAL | Status: DC

## 2016-05-25 MED ORDER — CEFAZOLIN IN D5W 1 GM/50ML IV SOLN
1.0000 g | Freq: Three times a day (TID) | INTRAVENOUS | Status: DC
  Filled 2016-05-25 (×9): qty 50

## 2016-05-25 MED ORDER — MUPIROCIN CALCIUM 2 % EX CREA: TOPICAL_CREAM | Freq: Every day | CUTANEOUS | 0 refills | Status: DC

## 2016-05-25 MED ORDER — WARFARIN - PHARMACIST DOSING INPATIENT: Freq: Every day | Status: DC

## 2016-05-25 MED ORDER — CEPHALEXIN 500 MG PO CAPS: 500.0000 mg | ORAL_CAPSULE | Freq: Two times a day (BID) | ORAL | 0 refills | Status: DC

## 2016-05-25 NOTE — Progress Notes (Signed)
Patient with orders to be discharge home. Discharge instructions given, patient verbalized understanding. Prescriptions given. Patient stable. Patient left in private vehicle with family.  

## 2016-05-25 NOTE — Progress Notes (Signed)
ANTICOAGULATION CONSULT NOTE -  Pharmacy Consult for WARFARIN (chronic Rx PTA) Indication: atrial fibrillation  Allergies  Allergen Reactions  . Diltiazem Anxiety and Other (See Comments)    Sleep disturbance  . Iohexol      Desc: THROAT SWELLING-NOTED IN CHART AFTER IVC PLACEMENT-ARS 12-02-05   . Latex Other (See Comments)    unknown  . Penicillins Swelling    Patient was given zosyn this admission and tolerated. After d/w MD and pt, she had swelling in arm only, so possible just infiltration and not allergic. ABx changed to Cefazolin and is tolerating  . Shellfish Allergy Other (See Comments)    Reaction unknown   Patient Measurements: Height: 4\' 9"  (144.8 cm) Weight: 180 lb 8.9 oz (81.9 kg) IBW/kg (Calculated) : 38.6  Vital Signs: Temp: 98.2 F (36.8 C) (09/01 0452) Temp Source: Oral (09/01 0452) BP: 132/62 (09/01 0452) Pulse Rate: 88 (09/01 0452)  Labs:  Recent Labs  05/23/16 0055 05/23/16 0537 05/23/16 0830 05/23/16 1204 05/24/16 0452 05/25/16 0632 05/25/16 0810  HGB 9.6* 9.2*  --   --  9.7*  --   --   HCT 28.9* 28.3*  --   --  30.3*  --   --   PLT 189 188  --   --  200  --   --   LABPROT 42.6*  --   --   --  37.3* 28.4*  --   INR 4.33*  --   --   --  3.66 2.61  --   CREATININE 1.35* 1.19*  --   --  1.20*  --  0.90  CKTOTAL 172  --   --   --   --   --   --   TROPONINI 0.03* <0.03 <0.03 <0.03  --   --   --     Estimated Creatinine Clearance: 38.1 mL/min (by C-G formula based on SCr of 0.9 mg/dL).   Medical History: Past Medical History:  Diagnosis Date  . Cataract   . COPD (chronic obstructive pulmonary disease) ([place])   . Depression   . DVT (deep venous thrombosis) ([place])   . Gastric erosion   . GERD (gastroesophageal reflux disease)   . Hypertension   . Insomnia   . Ischemic colitis ([place])   . Osteoarthritis   . Parotid mass 2016  . 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 albuterol (PROVENTIL) (2.5 MG/3ML) 0.083% nebulizer solution Take 3 mLs (2.5 mg total) by nebulization every 2 (two) hours as needed for wheezing or shortness of breath. 75 mL 12 unknown  . amitriptyline (ELAVIL) 75 MG tablet TAKE 1 TABLETS AT BEDTIME 30 tablet 2 05/21/2016  . Calcium Carbonate-Vitamin D (CALCIUM-VITAMIN D) 500-200 MG-UNIT tablet Take 1 tablet by mouth every morning.   05/21/2016  . citalopram (CELEXA) 20 MG tablet TAKE 1 TABLET DAILY (Patient taking differently: TAKE 1 TABLET DAILY IN THE MORNING) 30 tablet 0 05/21/2016  . donepezil (ARICEPT) 10 MG tablet TAKE ONE TABLET AT BEDTIME 90 tablet 1 05/21/2016  . furosemide (LASIX) 40 MG tablet TAKE (2) TABLETS DAILY (Patient taking differently: TAKE ONE TABLET BY MOUTH TWICE DAILY) 60 tablet 0 05/21/2016  . gabapentin (NEURONTIN) 300 MG capsule TAKE (1) CAPSULE TWICE DAILY. 60 capsule 3 05/21/2016  . hydrOXYzine (ATARAX/VISTARIL) 10 MG tablet TAKE (1) TABLET THREE TIMES DAILY. 90 tablet 2 05/21/2016  . ibuprofen (ADVIL,MOTRIN)  200 MG tablet Take 200 mg by mouth every 6 (six) hours as needed for moderate pain.   unknown  . levocetirizine (XYZAL) 5 MG tablet Take 1 tablet (5 mg total) by mouth at bedtime. 90 tablet 2 05/21/2016  . omeprazole (PRILOSEC) 20 MG capsule TAKE 2 CAPSULES DAILY AS NEEDED FOR REFLUX (Patient taking differently: TAKE 2 CAPSULES DAILY IN THE MORNING) 60 capsule 3 05/21/2016  . potassium chloride (K-DUR) 10 MEQ tablet TAKE 4 TABLETS ONCE DAILY (Patient taking differently: TAKE 4 TABLETS ONCE DAILY IN THE MORNING) 120 tablet 4 05/21/2016  . rOPINIRole (REQUIP) 1 MG tablet TAKE 1 OR 2 TABLETS BY MOUTH AT BEDTIME 56 tablet 3 05/21/2016  . verapamil (CALAN-SR) 180 MG CR tablet Take 1 tablet (180 mg total) by mouth at bedtime. 30 tablet 2 05/21/2016  . Vitamin D-Vitamin K (VITAMIN K2-VITAMIN D3 PO) Take 2 capsules by mouth every morning.   05/21/2016  . warfarin (COUMADIN) 2.5 MG  tablet Take 1 tablet (2.5 mg total) by mouth daily. 30 tablet 1 05/21/2016 at 0930  . OXYGEN Inhale into the lungs at bedtime.     . triamcinolone cream (KENALOG) 0.1 % Apply 1 application topically daily. 30 g 1 05/21/2016    Assessment: 82yo female on chronic Coumadin PTA.  Home dose listed above.  Pt brought to hospital after a fall.  CT cervical spine showed minimally displaced fracture of C5 vertebral body.  INR 2.61 today, restart Coumadin.  Goal of Therapy:  INR 2-3 Monitor platelets by anticoagulation protocol: Yes   Plan:  Coumadin 2.5mg  today Daily PT/INR  Monitor CBC, s/sx of bleeding complications  Isac Sarna, BS Vena [place], BCPS Clinical Pharmacist Pager [phone] 05/25/2016,9:40 AM

## 2016-05-25 NOTE — Progress Notes (Signed)
Wean patient off of oxygen therapy. Patient O2 saturation maintained between 92%-96% on room air.

## 2016-05-25 NOTE — Care Management Important Message (Signed)
Important Message  Patient Details  Name: [name] Walker MRN: [id number] Date of Birth: 06-23-1925   Medicare Important Message Given:  Yes    Laniece Hornbaker, Chauncey [place], RN 05/25/2016, 2:06 PM

## 2016-05-25 NOTE — Discharge Summary (Signed)
Physician Discharge Summary  [name] Walker [id number] DOB: 1925/09/08 DOA: 05/23/2016  PCP: Claretta Fraise, MD  Admit date: 05/23/2016 Discharge date: 05/25/2016  Admitted From: Home Disposition:  Home  Recommendations for Outpatient Follow-up:  1. Follow up with PCP in 1-2 weeks 2. Repeat INR in 2-3 days, goal to keep INR 2-3 3. Please monitor blood pressure. Patient asked to hold verapamil for now given resolving septic shock. Consider resuming when blood pressure further improves. 4. Wound care recommendations: 1. Interdry silver-impregnated fabric to promote healing to inner abd/breast skin folds.  2. This should remain in place for 5 days optimal plan of care to promote healing. 3. Daily bactroban and dressing to promote moist healing to right ankle.   Home Health:[facility]   Discharge Condition:Improved CODE STATUS:DNR Diet recommendation: Soft   Brief/Interim Summary: 82 y.o.female,with history of hypertension, atrial fibrillation on anticoagulation with Coumadin, COPD, previous history of DVT was brought to the hospital by EMS after a fall. Patient was obviously seen in the ED for complaints of right lower extremity swelling, ulcer on right ankle, patient was given 1 dose of vancomycin and morphine 4 mg IV 1 patient was discharged on Bactrim.  At home patient became very weak was unable to stand and fell. She denied chest pain, no shortness of breath. No nausea vomiting or diarrhea.  In the ED patient found to be hypotensive with blood pressure 73/53, required IV fluid boluses. Presenting lactic acid 1.39  CT cervical spine showed Minimally displaced fracture of the anterior superior corner of the C5 vertebral body, neurosurgery Dr Cyndy Freeze was consulted by the ED physician, who recommended no cervical collar no need of outpatient follow-up or MRI   1. Right lower extremity cellulitis with sepsis and septic shock present on admission 1. Right lower extremity presented  with increased redness, tenderness and swelling 2. Patient presented with hypothermia and mild leukocytosis of 11.8 thousand with hypotension 3. Hypotension improved with IV fluid hydration 4. Blood pressure medications were placed on hold temporarily 5. Blood pressures normalized 6. Patient had been on empiric vancomycin and zosyn, tolerating well 7. Question patient's "penicilin allergy" as pt tolerated zosyn and later ancef 8. RLE cellulitis continues to improve. Recommend completing course with keflex on discharge 2. Hypokalemia 1. Replaced 3. Chronic atrial fibrillation 1. Presently rate controlled. 2. Continue to monitor on telemetry 3. On Coumadin for secondary stroke prevention, dosing per pharmacy 4. Goal to keep INR 2-3 4. Minimally displaced fracture of the anterior superior corner of the C5 vertebral body 1. See resulting H&P 2. Case was discussed with neurosurgery on call on admission 3. Recommendations for supportive care 5. Status post fall 1. Seen by PT. Recs for home health PT 6. Hypertension 1. Patient presented in presumed septic shock, resolved with IV fluids 2. Lasix, verapamil were on hold this admission 3. BP improved. 4. Patient to resume lasix on discharge. When blood pressure further improves, consider resuming verapamil as outpatient 5. Will ask PCP to follow BP closely as outpatient 7. R ankle ulcer, suspect related to venous stasis 1. Seen by [place] with wound care recs per above  Discharge Diagnoses:  Principal Problem:   Sepsis due to cellulitis [facility]) Active Problems:   Essential hypertension   COPD (chronic obstructive pulmonary disease) ([facility])   Atrial fibrillation, chronic ([facility])   Hypotension   Hypokalemia   Severe sepsis with septic shock ([place])   C5 vertebral fracture [facility])    Discharge Instructions     Medication List  STOP taking these medications   verapamil 180 MG CR tablet Commonly known as:  CALAN-SR     TAKE these  medications   albuterol (2.5 MG/3ML) 0.083% nebulizer solution Commonly known as:  PROVENTIL Take 3 mLs (2.5 mg total) by nebulization every 2 (two) hours as needed for wheezing or shortness of breath.   amitriptyline 75 MG tablet Commonly known as:  ELAVIL TAKE 1 TABLETS AT BEDTIME   calcium-vitamin D 500-200 MG-UNIT tablet Take 1 tablet by mouth every morning.   cephALEXin 500 MG capsule Commonly known as:  KEFLEX Take 1 capsule (500 mg total) by mouth 2 (two) times daily.   citalopram 20 MG tablet Commonly known as:  CELEXA TAKE 1 TABLET DAILY What changed:  See the new instructions.   donepezil 10 MG tablet Commonly known as:  ARICEPT TAKE ONE TABLET AT BEDTIME   furosemide 40 MG tablet Commonly known as:  LASIX TAKE (2) TABLETS DAILY What changed:  See the new instructions.   gabapentin 300 MG capsule Commonly known as:  NEURONTIN TAKE (1) CAPSULE TWICE DAILY.   hydrOXYzine 10 MG tablet Commonly known as:  ATARAX/VISTARIL TAKE (1) TABLET THREE TIMES DAILY.   ibuprofen 200 MG tablet Commonly known as:  ADVIL,MOTRIN Take 200 mg by mouth every 6 (six) hours as needed for moderate pain.   levocetirizine 5 MG tablet Commonly known as:  XYZAL Take 1 tablet (5 mg total) by mouth at bedtime.   mupirocin cream 2 % Commonly known as:  BACTROBAN Apply topically daily. Apply to right ankle daily then cover with foam dressing   omeprazole 20 MG capsule Commonly known as:  PRILOSEC TAKE 2 CAPSULES DAILY AS NEEDED FOR REFLUX What changed:  See the new instructions.   OXYGEN Inhale into the lungs at bedtime.   potassium chloride 10 MEQ tablet Commonly known as:  K-DUR TAKE 4 TABLETS ONCE DAILY What changed:  See the new instructions.   rOPINIRole 1 MG tablet Commonly known as:  REQUIP TAKE 1 OR 2 TABLETS BY MOUTH AT BEDTIME   triamcinolone cream 0.1 % Commonly known as:  KENALOG Apply 1 application topically daily.   VITAMIN K2-VITAMIN D3 PO Take 2  capsules by mouth every morning.   warfarin 2.5 MG tablet Commonly known as:  COUMADIN Take 1 tablet (2.5 mg total) by mouth daily.       Allergies  Allergen Reactions  . Diltiazem Anxiety and Other (See Comments)    Sleep disturbance  . Iohexol      Desc: THROAT SWELLING-NOTED IN CHART AFTER IVC PLACEMENT-ARS 12-02-05   . Latex Other (See Comments)    unknown  . Penicillins Swelling    Patient was given zosyn this admission and tolerated. After d/w MD and pt, she had swelling in arm only, so possible just infiltration and not allergic. ABx changed to Cefazolin and is tolerating  . Shellfish Allergy Other (See Comments)    Reaction unknown    Procedures/Studies: Dg Ankle Complete Right  Result Date: 05/22/2016 CLINICAL DATA:  RIGHT ankle foot and drainage for 1 month. Medial ankle ulcer. EXAM: RIGHT ANKLE - COMPLETE 3+ VIEW COMPARISON:  RIGHT ankle radiograph March 10, 2014 FINDINGS: There is no evidence of fracture, dislocation, or joint effusion. There is no evidence of arthropathy or other focal bone abnormality. Moderate medial ankle soft tissue swelling though, decreased from prior examination. Focal median ankle subcutaneous gas most compatible with ulcer. No radiopaque foreign bodies. IMPRESSION: Medial ankle soft tissue swelling and ulceration.  No acute osseous process. Electronically Signed   By: Elon Alas M.D.   On: 05/22/2016 19:06   Ct Head Wo Contrast  Result Date: 05/23/2016 CLINICAL DATA:  Atrial fibrillation. On anti coagulation. Status post fall. EXAM: CT HEAD WITHOUT CONTRAST CT CERVICAL SPINE WITHOUT CONTRAST TECHNIQUE: Multidetector CT imaging of the head and cervical spine was performed following the standard protocol without intravenous contrast. Multiplanar CT image reconstructions of the cervical spine were also generated. COMPARISON:  Head CT 04/18/2015 and neck CT 04/18/2015 FINDINGS: CT HEAD FINDINGS There is no mass lesion, intraparenchymal hemorrhage  or extra-axial collection. No evidence of acute cortical infarct. No hydrocephalus. 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 Brain parenchyma otherwise normal for age. Normal ventricles and sulci. The paranasal sinuses and mastoids are free of fluid. The orbits are normal. Normal skull and visualized extracranial soft tissues. CT CERVICAL SPINE FINDINGS There is a minimally displaced fracture of the anterior superior corner of the C5 vertebral body (series 8, image 39). No static subluxation. Facets are normally aligned. The occipital condyles are aligned. The dens is intact.There is extensive facet arthrosis with moderate to severe narrowing of the neural foramina at multiple levels. No advanced spinal canal stenosis.Visualized paraspinal soft tissues and lung apices are normal. IMPRESSION: 1. Minimally displaced fracture of the anterior superior corner of the C5 vertebral body. This is somewhat age indeterminate, but was not present on neck CT of 04/18/2015 and is favored to be acute. 2. No acute intracranial abnormality. Electronically Signed   By: Ulyses Jarred M.D.   On: 05/23/2016 02:22   Ct Cervical Spine Wo Contrast  Result Date: 05/23/2016 CLINICAL DATA:  Atrial fibrillation. On anti coagulation. Status post fall. EXAM: CT HEAD WITHOUT CONTRAST CT CERVICAL SPINE WITHOUT CONTRAST TECHNIQUE: Multidetector CT imaging of the head and cervical spine was performed following the standard protocol without intravenous contrast. Multiplanar CT image reconstructions of the cervical spine were also generated. COMPARISON:  Head CT 04/18/2015 and neck CT 04/18/2015 FINDINGS: CT HEAD FINDINGS There is no mass lesion, intraparenchymal hemorrhage or extra-axial collection. No evidence of acute cortical infarct. No hydrocephalus. 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 Brain parenchyma otherwise normal for age. Normal ventricles and sulci. The paranasal sinuses and mastoids are free of fluid. The orbits are normal.  Normal skull and visualized extracranial soft tissues. CT CERVICAL SPINE FINDINGS There is a minimally displaced fracture of the anterior superior corner of the C5 vertebral body (series 8, image 39). No static subluxation. Facets are normally aligned. The occipital condyles are aligned. The dens is intact.There is extensive facet arthrosis with moderate to severe narrowing of the neural foramina at multiple levels. No advanced spinal canal stenosis.Visualized paraspinal soft tissues and lung apices are normal. IMPRESSION: 1. Minimally displaced fracture of the anterior superior corner of the C5 vertebral body. This is somewhat age indeterminate, but was not present on neck CT of 04/18/2015 and is favored to be acute. 2. No acute intracranial abnormality. Electronically Signed   By: Ulyses Jarred M.D.   On: 05/23/2016 02:22   Dg Chest Portable 1 View  Result Date: 05/23/2016 CLINICAL DATA:  [place] at home tonight.  Found on floor. EXAM: PORTABLE CHEST 1 VIEW COMPARISON:  07/28/2015 FINDINGS: There is mild unchanged cardiomegaly. The lungs are clear. There is no large effusion. Mediastinal and hilar contours are unremarkable and unchanged. Incidental findings include chronic arthropathy of both shoulders. IMPRESSION: Unchanged cardiomegaly.  No acute findings. Electronically Signed   By: Valerie Roys.D.  On: 05/23/2016 01:40   Dg Foot Complete Right  Result Date: 05/22/2016 CLINICAL DATA:  Right foot and ankle pain. Drainage for over a month from the medial ankle. EXAM: RIGHT FOOT COMPLETE - 3+ VIEW COMPARISON:  None. FINDINGS: Bony demineralization. Small marginal articular erosion along the base of the middle phalanx small toe. Lisfranc joint alignment normal. No bony destructive findings are identified. Thickened distal Achilles tendon is suggested, with Achilles calcaneal spurring and a Haglund deformity. Plantar calcaneal spur. Subcutaneous edema along the plantar heel. IMPRESSION: 1. Bony  demineralization. No bony destructive findings identified to suggest osteomyelitis. No gas tracking in the soft tissues. 2. Probable distal Achilles tendinopathy with Achilles calcaneal spur and Haglund deformity. 3. Plantar calcaneal spurring 4. Subcutaneous edema along the plantar heel. 5. Small marginal erosion in the small toe may reflect low-grade underlying erosive arthropathy . Electronically Signed   By: Van Clines M.D.   On: 05/22/2016 19:03    Subjective: Eager to go home  Discharge Exam: Vitals:   05/24/16 2220 05/25/16 0452  BP: (!) 127/59 132/62  Pulse: 84 88  Resp: 18 16  Temp: 98.4 F (36.9 C) 98.2 F (36.8 C)   Vitals:   05/24/16 1221 05/24/16 1300 05/24/16 2220 05/25/16 0452  BP:  132/74 (!) 127/59 132/62  Pulse:  78 84 88  Resp:  17 18 16   Temp: 97.4 F (36.3 C)  98.4 F (36.9 C) 98.2 F (36.8 C)  TempSrc: Oral  Oral Oral  SpO2:  99% 96% 95%  Weight:      Height:        General: Pt is alert, awake, not in acute distress Cardiovascular: RRR, S1/S2 +, no rubs, no gallops Respiratory: CTA bilaterally, no wheezing, no rhonchi Abdominal: Soft, NT, ND, bowel sounds + Extremities: no cyanosis, chronic venous changes in bilateral lower extremities, RLE with improving erythema and swelling   The results of significant diagnostics from this hospitalization (including imaging, microbiology, ancillary and laboratory) are listed below for reference.     Microbiology: Recent Results (from the past 240 hour(s))  Blood culture (routine x 2)     Status: None (Preliminary result)   Collection Time: 05/23/16  1:25 AM  Result Value Ref Range Status   Specimen Description BLOOD LEFT FOREARM  Final   Special Requests BOTTLES DRAWN AEROBIC AND ANAEROBIC 4CC  Final   Culture NO GROWTH 1 DAY  Final   Report Status PENDING  Incomplete  Blood culture (routine x 2)     Status: None (Preliminary result)   Collection Time: 05/23/16  1:30 AM  Result Value Ref Range  Status   Specimen Description BLOOD LEFT HAND  Final   Special Requests BOTTLES DRAWN AEROBIC AND ANAEROBIC 6CC  Final   Culture NO GROWTH 1 DAY  Final   Report Status PENDING  Incomplete  MRSA PCR Screening     Status: Abnormal   Collection Time: 05/23/16  5:23 AM  Result Value Ref Range Status   MRSA by PCR POSITIVE (A) NEGATIVE Final    Comment:        The GeneXpert MRSA Assay (FDA approved for NASAL specimens only), is one component of a comprehensive MRSA colonization surveillance program. It is not intended to diagnose MRSA infection nor to guide or monitor treatment for MRSA infections. RESULT CALLED TO, READ BACK BY AND VERIFIED WITH: BETHEL E. AT 0730A ON [id number] BY THOMPSON S.      Labs: BNP (last 3 results)  Recent Labs  07/13/15 1640 07/28/15 0918  BNP 669.0* 99991111*   Basic Metabolic Panel:  Recent Labs Lab 05/22/16 1857 05/23/16 0055 05/23/16 0537 05/24/16 0452 05/25/16 0810  NA 138 138 141 139 140  K 2.9* 3.1* 3.4* 3.7 3.6  CL 100* 102 106 109 110  CO2 28 28 26 25 25   GLUCOSE 93 132* 123* 100* 97  BUN 21* 21* 20 18 12   CREATININE 1.16* 1.35* 1.19* 1.20* 0.90  CALCIUM 9.1 8.5* 7.8* 8.2* 8.5*   Liver Function Tests:  Recent Labs Lab 05/23/16 0055 05/23/16 0537  AST 23 32  ALT 11* 16  ALKPHOS 94 104  BILITOT 1.0 0.8  PROT 6.3* 5.8*  ALBUMIN 3.1* 2.9*   No results for input(s): LIPASE, AMYLASE in the last 168 hours. No results for input(s): AMMONIA in the last 168 hours. CBC:  Recent Labs Lab 05/22/16 1857 05/23/16 0055 05/23/16 0537 05/24/16 0452  WBC 15.0* 12.0* 11.8* 5.0  NEUTROABS 11.9* 9.5*  --   --   HGB 10.6* 9.6* 9.2* 9.7*  HCT 31.8* 28.9* 28.3* 30.3*  MCV 93.5 93.5 96.9 97.4  PLT 203 189 188 200   Cardiac Enzymes:  Recent Labs Lab 05/23/16 0055 05/23/16 0537 05/23/16 0830 05/23/16 1204  CKTOTAL 172  --   --   --   TROPONINI 0.03* <0.03 <0.03 <0.03   BNP: Invalid input(s): POCBNP CBG:  Recent Labs Lab  05/23/16 0056  GLUCAP 129*   D-Dimer No results for input(s): DDIMER in the last 72 hours. Hgb A1c No results for input(s): HGBA1C in the last 72 hours. Lipid Profile No results for input(s): CHOL, HDL, LDLCALC, TRIG, CHOLHDL, LDLDIRECT in the last 72 hours. Thyroid function studies No results for input(s): TSH, T4TOTAL, T3FREE, THYROIDAB in the last 72 hours.  Invalid input(s): FREET3 Anemia work up No results for input(s): VITAMINB12, FOLATE, FERRITIN, TIBC, IRON, RETICCTPCT in the last 72 hours. Urinalysis    Component Value Date/Time   COLORURINE YELLOW 05/23/2016 0140   APPEARANCEUR CLEAR 05/23/2016 0140   LABSPEC 1.015 05/23/2016 0140   PHURINE 5.5 05/23/2016 0140   GLUCOSEU NEGATIVE 05/23/2016 0140   HGBUR TRACE (A) 05/23/2016 0140   BILIRUBINUR NEGATIVE 05/23/2016 0140   KETONESUR NEGATIVE 05/23/2016 0140   PROTEINUR TRACE (A) 05/23/2016 0140   UROBILINOGEN 0.2 01/19/2013 0040   NITRITE NEGATIVE 05/23/2016 0140   LEUKOCYTESUR NEGATIVE 05/23/2016 0140   Sepsis Labs Invalid input(s): PROCALCITONIN,  WBC,  LACTICIDVEN Microbiology Recent Results (from the past 240 hour(s))  Blood culture (routine x 2)     Status: None (Preliminary result)   Collection Time: 05/23/16  1:25 AM  Result Value Ref Range Status   Specimen Description BLOOD LEFT FOREARM  Final   Special Requests BOTTLES DRAWN AEROBIC AND ANAEROBIC 4CC  Final   Culture NO GROWTH 1 DAY  Final   Report Status PENDING  Incomplete  Blood culture (routine x 2)     Status: None (Preliminary result)   Collection Time: 05/23/16  1:30 AM  Result Value Ref Range Status   Specimen Description BLOOD LEFT HAND  Final   Special Requests BOTTLES DRAWN AEROBIC AND ANAEROBIC 6CC  Final   Culture NO GROWTH 1 DAY  Final   Report Status PENDING  Incomplete  MRSA PCR Screening     Status: Abnormal   Collection Time: 05/23/16  5:23 AM  Result Value Ref Range Status   MRSA by PCR POSITIVE (A) NEGATIVE Final    Comment:  The GeneXpert MRSA Assay (FDA approved for NASAL specimens only), is one component of a comprehensive MRSA colonization surveillance program. It is not intended to diagnose MRSA infection nor to guide or monitor treatment for MRSA infections. RESULT CALLED TO, READ BACK BY AND VERIFIED WITH: BETHEL E. AT 0730A ON [id number] BY Biagio Borg.      SIGNED:   CHIU, Orpah Melter, MD  [facility] 05/25/2016, 11:55 AM  If 7PM-7AM, please contact night-coverage [email or web address] Password [email or web address]

## 2016-05-29 ENCOUNTER — Telehealth: Payer: Self-pay | Admitting: *Deleted

## 2016-05-29 LAB — CULTURE, BLOOD (ROUTINE X 2)
CULTURE: NO GROWTH
CULTURE: NO GROWTH

## 2016-05-30 ENCOUNTER — Other Ambulatory Visit: Payer: Self-pay | Admitting: Family Medicine

## 2016-05-30 ENCOUNTER — Telehealth: Payer: Self-pay | Admitting: *Deleted

## 2016-05-30 ENCOUNTER — Ambulatory Visit (INDEPENDENT_AMBULATORY_CARE_PROVIDER_SITE_OTHER): Payer: Commercial Managed Care - HMO | Admitting: Pharmacist

## 2016-05-30 LAB — PROTIME-INR: Protime: 39.7 seconds — AB (ref 10.0–13.8)

## 2016-05-30 LAB — POCT INR: INR: 3.3 — AB (ref 0.9–1.1)

## 2016-05-30 MED ORDER — WARFARIN SODIUM 2 MG PO TABS: 2.0000 mg | ORAL_TABLET | Freq: Every day | ORAL | 1 refills | Status: DC

## 2016-05-30 NOTE — Telephone Encounter (Signed)
Patient was discharged from [facility] on 05/25/16. She was admitted for hypotension and found to have sepsis related to cellulitis. She has a home health nurse coming several times per week for wound care. Discharge instructions were to have a protime in 2-3 days. I spoke with [place] and gave a verbal order for PT/INR when nurse goes out today. They will call me directly with the results.   I spoke with patient yesterday afternoon at 4:45. She has an appointment for a physical scheduled with Evelina Dun, NP for 05/31/16. She states that she is feeling fine and would still like to have the physical. She is taking her medications as prescribed.  No TCM scheduled at this time since Ms Denaro would like to keep her physical appointment. A follow up appt may be necessary in the near future.

## 2016-05-30 NOTE — Telephone Encounter (Signed)
Instructions given to [facility] with [facility] - recheck INR in 5 days

## 2016-05-30 NOTE — Telephone Encounter (Signed)
Patient is notified to hold warfarin for 1 day.  She is to then start warfarin 2mg  1 tablet daily.  Patient recieves meds through [facility] for daily use.  She has removed warfarin dose for tomorrow (she already took today's this am).  New packaging going out tomorrow to be started Friday with warfarin 2mg  daily .  Patient and [facility] aware.  Tried to call Tye Maryland with [facility] but go VM which did not have any identifying information so left message to call office for instruction.

## 2016-05-30 NOTE — Telephone Encounter (Signed)
Tye Maryland, Nurse with [facility] called with Ms Zorn's PT/INR results from today. INR was 3.3 and PT was 39.7. She is currently taking coumadin 5mg  1/2 tablet daily. Please review and call home health nurse with dosage instructions.   She will be in for an office visit tomorrow with Evelina Dun, FNP.

## 2016-05-31 ENCOUNTER — Encounter: Payer: Commercial Managed Care - HMO | Admitting: Family

## 2016-06-01 ENCOUNTER — Encounter: Payer: Self-pay | Admitting: Family Medicine

## 2016-06-04 ENCOUNTER — Telehealth: Payer: Self-pay | Admitting: Pharmacist

## 2016-06-04 ENCOUNTER — Telehealth: Payer: Self-pay

## 2016-06-04 ENCOUNTER — Other Ambulatory Visit: Payer: Self-pay | Admitting: Family Medicine

## 2016-06-04 NOTE — Telephone Encounter (Signed)
Notified Otila Kluver that it's ok to postpone INR recheck until tomorrow 06/05/16

## 2016-06-04 NOTE — Telephone Encounter (Signed)
Understand - Otila Kluver with [facility] will come out tomorrow 06/05/16 to check INR.

## 2016-06-05 ENCOUNTER — Telehealth: Payer: Self-pay | Admitting: *Deleted

## 2016-06-05 ENCOUNTER — Other Ambulatory Visit: Payer: Self-pay | Admitting: Pharmacist

## 2016-06-05 ENCOUNTER — Ambulatory Visit (INDEPENDENT_AMBULATORY_CARE_PROVIDER_SITE_OTHER): Payer: Commercial Managed Care - HMO | Admitting: Pharmacist

## 2016-06-05 LAB — POCT INR: INR: 1.7 — AB (ref 0.9–1.1)

## 2016-06-05 MED ORDER — WARFARIN SODIUM 2 MG PO TABS: 2.0000 mg | ORAL_TABLET | Freq: Every day | ORAL | 0 refills | Status: DC

## 2016-06-05 NOTE — Telephone Encounter (Signed)
INR today was 1.7  Please call Gilmore Laroche with [facility] with directions for coumadin adjustment. She had her daily dose of coumadin this morning. The nurse was questioning whether she should take it at night vs in the morning.

## 2016-06-05 NOTE — Progress Notes (Signed)
No charge for labs or visit - INR check through home health nurse

## 2016-06-05 NOTE — Telephone Encounter (Signed)
It is generally recommended that warfarin be taken in evening in case we need to have patient hold warfarin dose however [name] Walker has been taking in the morning and this does not affect it efficacy.   I recommend continuing to take in the morning as she has been taking  INR was subtherapeutic - pharmacy will deliver an extra 2mg  tablet for her to take tomorrow.  Then on Thursday she will receive her weekly meds that will include new warfarin dose of 2mg  daily except 4mg  on Saturdays.  Left message with [name] with [facility] reagarding new dose and recheck INR in 1 week.   Patient notified.

## 2016-06-11 ENCOUNTER — Telehealth: Payer: Self-pay

## 2016-06-11 ENCOUNTER — Ambulatory Visit (INDEPENDENT_AMBULATORY_CARE_PROVIDER_SITE_OTHER): Payer: Commercial Managed Care - HMO | Admitting: Pharmacist

## 2016-06-11 LAB — POCT INR: INR: 1.4

## 2016-06-11 MED ORDER — WARFARIN SODIUM 2 MG PO TABS: ORAL_TABLET | ORAL | 0 refills | Status: DC

## 2016-06-11 MED ORDER — WARFARIN SODIUM 2 MG PO TABS: 2.0000 mg | ORAL_TABLET | Freq: Every day | ORAL | 0 refills | Status: DC

## 2016-06-11 NOTE — Telephone Encounter (Signed)
Verified warfarin dose with [facility] since they provide patient's medications in packaging.  She is taking warfarin 2mg  qd except 4mg  on Saturdays. INR 1.4 PT 16.7 per Home Health nurse INR is subtherapeutic.  Recommend take warfarin 4mg  for the next 3 days.  Then start new dose of warfarin 4mg  MWF and 2mg  all other days.  Patient was notified of change.  Patient's pharmacy was notified of changes and they are going to deliver extra tablets need this week to her today.  Notified Otila Kluver with [facility] - she will recheck INR Thursday, September 21st.

## 2016-06-12 ENCOUNTER — Other Ambulatory Visit: Payer: Self-pay | Admitting: Licensed Clinical Social Worker

## 2016-06-12 ENCOUNTER — Encounter: Payer: Self-pay | Admitting: Licensed Clinical Social Worker

## 2016-06-12 NOTE — Patient Outreach (Signed)
Assessment:  CSW received referral on [name] Walker. CSW completed chart review on client on 06/12/16. Client sees Dr. Claretta Fraise as primary care doctor. Client was recently hospitalized. She discharged from the hospital on 05/25/16 and returned to her home with needed supports in place. Client is receiving home health services with [facility].  Client does live alone and has reported that she has reduced support system to help her.  Client has [employer]. Client is receiving home health physical therapy services in the home. Client is receiving home health nursing services in the home.  CSW spoke via phone with client on 06/12/16.  CSW verified client identity. CSW received verbal permission from client on 06/12/16 for CSW to speak with client about current client needs. Client is eating adequately and is sleeping adequately.  Client has prescribed medications and is taking medications as prescribed.  Client has some support from her daughter, [name] Walker. [name] Walker lives near client and checks on client and client needs daily.  Client has several walkers she uses in her home to help client ambulate. CSW and client spoke of client care plan. CSW encouraged that client participate in all scheduled home health physical therapy sessions for client in next 30 days with [facility].  CSW and client completed needed [facility] assessments for client.  CSW informed client of [facility] program services in pharmacy, nursing and social work.  CSW thanked client for phone call with CSW on 06/12/16. CSW encouraged client or her daughter, [name] Walker, to call CSW at [phone] as needed to address social work needs of client.      Plan:  Client to participate in all scheduled home health physical therapy sessions for client in next 30 days with [facility].  CSW to call client in 3 weeks to assess client needs.  [name].Carmelita Amparo MSW, LCSW Licensed Clinical Social Worker [facility] Care  Management [phone]

## 2016-06-13 ENCOUNTER — Other Ambulatory Visit: Payer: Self-pay | Admitting: *Deleted

## 2016-06-13 NOTE — Patient Outreach (Signed)
06/13/16- Referral received from [facility], pt hospitalized end of August and discharged on 05/25/16 for cellulitis/ sepsis, lives alone per referral and no support system.  Referrals also to CSW and pharmacy.  Telephone call to pt, spoke with pt, HIPAA verified,  Pt has difficulty understanding over the telephone, states she does not need daughter to answer questions for her.  Pt agreeable to home visit, RN CM scheduled for next week on 06/21/16.  [name] [facility] [place] Coordinator [phone]

## 2016-06-14 ENCOUNTER — Ambulatory Visit: Payer: Self-pay | Admitting: Pharmacist

## 2016-06-14 ENCOUNTER — Ambulatory Visit (INDEPENDENT_AMBULATORY_CARE_PROVIDER_SITE_OTHER): Payer: Commercial Managed Care - HMO | Admitting: Otolaryngology

## 2016-06-14 ENCOUNTER — Telehealth: Payer: Self-pay | Admitting: *Deleted

## 2016-06-14 LAB — POCT INR: INR: 1.4

## 2016-06-14 NOTE — Telephone Encounter (Signed)
Patient notified of dose change and pharmacy will send out extra tablets at needed for change in dose  Increase to 4mg  daily except 2mg  on tuesdays and thursdays.  Patient given appt to follow up with Shelah Lewandowsky for cellulitis Monday, Setp 25th - will check INR then.

## 2016-06-14 NOTE — Telephone Encounter (Signed)
Juliann Pulse with [facility] called to report INR 1.4 PT 16.5

## 2016-06-18 ENCOUNTER — Ambulatory Visit (INDEPENDENT_AMBULATORY_CARE_PROVIDER_SITE_OTHER): Payer: Commercial Managed Care - HMO | Admitting: Nurse Practitioner

## 2016-06-18 ENCOUNTER — Encounter: Payer: Self-pay | Admitting: Nurse Practitioner

## 2016-06-18 VITALS — BP 133/80 | HR 138 | Temp 98.0°F | Ht <= 58 in | Wt 160.0 lb

## 2016-06-18 DIAGNOSIS — I482 Chronic atrial fibrillation, unspecified: Secondary | ICD-10-CM

## 2016-06-18 DIAGNOSIS — Z09 Encounter for follow-up examination after completed treatment for conditions other than malignant neoplasm: Secondary | ICD-10-CM | POA: Diagnosis not present

## 2016-06-18 DIAGNOSIS — Z7901 Long term (current) use of anticoagulants: Secondary | ICD-10-CM | POA: Diagnosis not present

## 2016-06-18 DIAGNOSIS — Z86718 Personal history of other venous thrombosis and embolism: Secondary | ICD-10-CM

## 2016-06-18 LAB — COAGUCHEK XS/INR WAIVED
INR: 2 — AB (ref 0.9–1.1)
PROTHROMBIN TIME: 24.3 s

## 2016-06-18 MED ORDER — WARFARIN SODIUM 2 MG PO TABS: ORAL_TABLET | ORAL | 5 refills | Status: DC

## 2016-06-18 NOTE — Patient Instructions (Signed)
Anticoagulation Dose Instructions as of 06/18/2016      [name] Walker Tue Wed Thu Fri Sat   New Dose 4 mg 4 mg 4 mg 4 mg 4 mg 4 mg 4 mg   Alt Week 4 mg 4 mg 2 mg 4 mg 2 mg 4 mg 4 mg    Description   Take 2 mg daily for 1 week then back to 2mg  daily except 1mg  on tues and thurs.    rechek in 2 weeks

## 2016-06-18 NOTE — Progress Notes (Signed)
   Subjective:    Patient ID: [name] Walker, female    DOB: April 03, 1925, 82 y.o.   MRN: [id number]  HPI  PAtient here today for hospital follow up- sh ewas seenin office with vounous stasis and cellulitis with legs weeping fluid on 05/23/16. She was sent to hospitla. Dx with cellulitis and was given vanconycin IV nad was discahrged home on Bact. After going home that night sh ebecame very weak and actually had a fall. She was taken back to the ER and admitted for dehydration and sepsis. SH ewas also foun dto have a small C5 fracture that just required a cervical collar. She stayed for several days. SHe is here today for follow up. She is still not taking verapamil due to blod pressure being low. She says she feels much better. All swelling has gone down in both legs.   * weight down 14lbs since last visit.  Review of Systems  Constitutional: Negative.   HENT: Negative.   Respiratory: Negative for cough and shortness of breath.   Cardiovascular: Negative.   Gastrointestinal: Negative.   Genitourinary: Negative.   Neurological: Negative for weakness.  Psychiatric/Behavioral: Negative.   All other systems reviewed and are negative.      Objective:   Physical Exam  Constitutional: She appears well-developed and well-nourished. No distress.  Cardiovascular: Normal rate, regular rhythm and normal heart sounds.   Pulmonary/Chest: Effort normal and breath sounds normal.  Abdominal: Soft. Bowel sounds are normal.  Musculoskeletal: She exhibits edema (1+ edema bil lower ext.).  Skin: Skin is warm.  Darken skin discoloration bil lower ext- healing scabbed area on right medial ankle.    BP 133/80   Pulse (!) 138   Temp 98 F (36.7 C) (Oral)   Ht 4\' 10"  (1.473 m)   Wt 160 lb (72.6 kg)   BMI 33.44 kg/m       Assessment & Plan:  1. Hospital discharge follow-up Hospital records reviewed  2. Atrial fibrillation, chronic (HCC) - CoaguChek XS/INR Waived  3. History of deep vein  thrombosis of lower extremity - CoaguChek XS/INR Waived  4. Chronic anticoagulation See INR documantation - CoaguChek XS/INR Waived  Mary-Margaret Hassell Done, FNP

## 2016-06-21 ENCOUNTER — Encounter: Payer: Self-pay | Admitting: *Deleted

## 2016-06-21 ENCOUNTER — Other Ambulatory Visit: Payer: Self-pay | Admitting: *Deleted

## 2016-06-21 NOTE — Telephone Encounter (Signed)
Multiple attempts made to contact patient.  This encounter will now be closed  

## 2016-06-21 NOTE — Patient Outreach (Signed)
[facility]) Care Management   06/21/2016  [name] Walker 06/19/1925 [id number]  [name] Walker is an 82 y.o. female  Subjective: Routine home visit with pt, HIPAA verified, pt reports she lives alone and has daughter [name] Walker and granddaughter that assists her but does not give permission for RN CM to speak with any family members.  Pt reports she tries to wear compression hose "most of the time, but misses some days".  Pt states RN CM cannot observe medications because [facility] and they will deliver them this afternoon.  Pt states " I'm doing good with my breathing"    Objective:   Vitals:   06/21/16 1228  BP: (!) 108/58  Pulse: 95  Resp: 18  SpO2: 96%  Weight: 157 lb (71.2 kg)  Height: 1.448 m (4\' 9" )   ROS  Physical Exam  Constitutional: She is oriented to person, place, and time. She appears well-developed and well-nourished.  HENT:  Head: Normocephalic.  Neck: Normal range of motion.  Cardiovascular: Normal rate.   Respiratory: Effort normal and breath sounds normal.  Dyspnea with exertion  GI: Soft. Bowel sounds are normal.  Musculoskeletal: Normal range of motion. She exhibits edema.  1+ Lower extremities bil Lower extremities mottled, dark in color  Neurological: She is alert and oriented to person, place, and time.  Skin: Skin is warm and dry.  Psychiatric: She has a normal mood and affect. Her behavior is normal. Judgment and thought content normal.    Encounter Medications:   Outpatient Encounter Prescriptions as of 06/21/2016  Medication Sig  . albuterol (PROVENTIL) (2.5 MG/3ML) 0.083% nebulizer solution Take 3 mLs (2.5 mg total) by nebulization every 2 (two) hours as needed for wheezing or shortness of breath.  Marland Kitchen amitriptyline (ELAVIL) 75 MG tablet TAKE 1 TABLETS AT BEDTIME  . Calcium Carbonate-Vitamin D (CALCIUM-VITAMIN D) 500-200 MG-UNIT tablet Take 1 tablet by mouth every morning.  . citalopram (CELEXA) 20 MG tablet  TAKE 1 TABLET DAILY  . donepezil (ARICEPT) 10 MG tablet TAKE ONE TABLET AT BEDTIME  . furosemide (LASIX) 40 MG tablet TAKE (2) TABLETS DAILY  . gabapentin (NEURONTIN) 300 MG capsule TAKE (1) CAPSULE TWICE DAILY.  . hydrOXYzine (ATARAX/VISTARIL) 10 MG tablet TAKE (1) TABLET THREE TIMES DAILY.  Marland Kitchen ibuprofen (ADVIL,MOTRIN) 200 MG tablet Take 200 mg by mouth every 6 (six) hours as needed for moderate pain.  Marland Kitchen levocetirizine (XYZAL) 5 MG tablet Take 1 tablet (5 mg total) by mouth at bedtime.  . mupirocin cream (BACTROBAN) 2 % Apply topically daily. Apply to right ankle daily then cover with foam dressing  . omeprazole (PRILOSEC) 20 MG capsule TAKE 2 CAPSULES DAILY AS NEEDED FOR REFLUX  . OXYGEN Inhale into the lungs at bedtime.  . potassium chloride (K-DUR) 10 MEQ tablet TAKE 4 TABLETS ONCE DAILY (Patient taking differently: TAKE 4 TABLETS ONCE DAILY IN THE MORNING)  . rOPINIRole (REQUIP) 1 MG tablet TAKE 1 OR 2 TABLETS AT BEDTIME  . triamcinolone cream (KENALOG) 0.1 % Apply 1 application topically daily.  . verapamil (VERELAN PM) 180 MG 24 hr capsule Take 180 mg by mouth at bedtime.  . Vitamin D-Vitamin K (VITAMIN K2-VITAMIN D3 PO) Take 2 capsules by mouth every morning.  . warfarin (COUMADIN) 2 MG tablet 2 tablets po daily except 1 tablet on tueday and thursday  . [DISCONTINUED] cephALEXin (KEFLEX) 500 MG capsule Take 1 capsule (500 mg total) by mouth 2 (two) times daily. (Patient not taking: Reported on 06/21/2016)  No facility-administered encounter medications on file as of 06/21/2016.     Functional Status:   In your present state of health, do you have any difficulty performing the following activities: 06/21/2016 06/12/2016  Hearing? N N  Vision? N N  Difficulty concentrating or making decisions? N N  Walking or climbing stairs? Y Y  Dressing or bathing? Y Y  Doing errands, shopping? [name]  Preparing Food and eating ? - Y  Using the Toilet? N N  In the past six months, have you accidently  leaked urine? Y N  Do you have problems with loss of bowel control? N N  Managing your Medications? Y Y  Managing your Finances? [name]  Housekeeping or managing your Housekeeping? [name]  Some recent data might be hidden    Fall/Depression Screening:    PHQ 2/9 Scores 06/21/2016 06/18/2016 06/12/2016 05/22/2016 04/06/2016 08/15/2015 07/22/2015  PHQ - 2 Score 0 0 2 0 1 0 0  PHQ- 9 Score - - 8 - - - -   Fall Risk  06/21/2016 06/18/2016 06/12/2016 05/22/2016 04/06/2016  Falls in the past year? Yes No Yes No Yes  Number falls in past yr: 1 - 1 - 1  Injury with Fall? No - Yes - -  Risk Factor Category  High Fall Risk - High Fall Risk - -  Risk for fall due to : History of fall(s);Impaired mobility;Impaired balance/gait;Medication side effect - History of fall(s);Impaired mobility;Impaired balance/gait;Medication side effect - -  Risk for fall due to (comments): - - - - -  Follow up Education provided;Falls prevention discussed;Falls evaluation completed - Falls prevention discussed - -    Assessment:  RN CM sent In Basket to [facility] reporting it does not appear that pt has any pharmacy needs at present with her prefilled med box, and pt reporting she always has all her medications and reports no issues with medications.   RN CM called [facility] case manager Elijio Miles RN to provide update and left voicemail requesting return phone call. RN CM called [facility] to reconcile medication list, per pharmacist, he also prefills warfarin and works in Facilities manager at [facility] to make sure pt is taking the correct dosage, pt gets PT/ INR checked at primary MD office, home health is involved with pt (PT and RN) and RN has been checking PT/ INR recently. RN CM left voicemail for [facility] CSW [employer] with update. RN CM faxed initial home visit and barrier letter to primary MD Dr. Claretta Fraise. Pt is high risk for falls, RN CM reviewed safety  precautions/ keeping pathways clear and always use walker. Pt states her goal is to be able to stay at home and not go back to hospital.  Pt does not care to involve her family members at present.  Pt says she does not qualify for medicaid or food stamps and does not have money to pay for assistance in the home if she were to ever need this service.  [facility] CM Care Plan Problem One   Flowsheet Row Most Recent Value  Care Plan Problem One  Knowledge deficit related to cellulits  Role Documenting the Problem One  Care Management Coordinator  Care Plan for Problem One  Active  THN Long Term Goal (31-90 days)  pt will verbalize better understanding related to cellulitis aeb no hospitalizations within 90 days  THN Long Term Goal Start Date  06/21/16  Interventions for Problem One Long  Term Goal  RN CM reviewed signs/ symptoms cellutlits/ infection and reportable signs/ symptoms/ action plan  THN CM Short Term Goal #1 (0-30 days)  pt will wear compression hose consistenly within 30 days  THN CM Short Term Goal #1 Start Date  06/21/16  Interventions for Short Term Goal #1  RN CM reinforced importance of wearing hose daily  to reduce risk of exacerbation of cellulitis, pt states she is able to apply the hose and waiting to apply, would not put hose on for RN CM    [facility] CM Care Plan Problem Two   Flowsheet Row Most Recent Value  Care Plan Problem Two  Knowledge deficit related to COPD  Role Documenting the Problem Two  Care Management Coordinator  Care Plan for Problem Two  Active  THN CM Short Term Goal #1 (0-30 days)  pt will verbalize COPD action plan/ zones at next visit within 30 days  THN CM Short Term Goal #1 Start Date  06/21/16  Interventions for Short Term Goal #2   RN CM reviewed COPD zones/ action plan with emphasis on yellow zone and importance of knowing how you feel each day.      Plan: see pt for home visit in 3 weeks on 07/12/16 Continue teaching on cellulitis management, COPD Look at  medications  [name] [facility] [place] [phone]

## 2016-06-22 ENCOUNTER — Telehealth: Payer: Self-pay | Admitting: *Deleted

## 2016-06-22 LAB — POCT INR: INR: 2.5 — AB (ref 0.9–1.1)

## 2016-06-22 NOTE — Telephone Encounter (Signed)
Angie with [facility] called with an INR for [name] Walker. It was 2.5 today.  Please call Angie back at [phone] with instructions.

## 2016-06-23 ENCOUNTER — Ambulatory Visit (INDEPENDENT_AMBULATORY_CARE_PROVIDER_SITE_OTHER): Payer: Commercial Managed Care - HMO | Admitting: Family Medicine

## 2016-06-23 DIAGNOSIS — L97311 Non-pressure chronic ulcer of right ankle limited to breakdown of skin: Secondary | ICD-10-CM

## 2016-06-23 DIAGNOSIS — I10 Essential (primary) hypertension: Secondary | ICD-10-CM

## 2016-06-23 DIAGNOSIS — L03115 Cellulitis of right lower limb: Secondary | ICD-10-CM

## 2016-06-23 DIAGNOSIS — I959 Hypotension, unspecified: Secondary | ICD-10-CM | POA: Diagnosis not present

## 2016-06-26 ENCOUNTER — Other Ambulatory Visit: Payer: Self-pay | Admitting: Family Medicine

## 2016-06-27 ENCOUNTER — Telehealth: Payer: Self-pay | Admitting: *Deleted

## 2016-06-27 ENCOUNTER — Ambulatory Visit (INDEPENDENT_AMBULATORY_CARE_PROVIDER_SITE_OTHER): Payer: Commercial Managed Care - HMO | Admitting: Pharmacist

## 2016-06-27 DIAGNOSIS — I482 Chronic atrial fibrillation: Secondary | ICD-10-CM

## 2016-06-27 LAB — POCT INR: INR: 1.9

## 2016-06-27 NOTE — Telephone Encounter (Signed)
INR slightly subtherapeutic.  Current dose is warfarin 2mg  tablets - take 2 tablets daily except 1 tablet  Recommend continue current dose and recheck in 1 week.  TIna with [facility] notifed of order - LM on VM Patient notified and pharmacy notified.

## 2016-06-27 NOTE — Telephone Encounter (Signed)
Per Otila Kluver with [facility] INR 1.9 Pt  22.5

## 2016-07-03 ENCOUNTER — Other Ambulatory Visit: Payer: Self-pay | Admitting: Licensed Clinical Social Worker

## 2016-07-03 ENCOUNTER — Other Ambulatory Visit: Payer: Self-pay | Admitting: Family Medicine

## 2016-07-03 NOTE — Patient Outreach (Signed)
Assessment:  CSW spoke via phone with client.  CSW verified client identity. CSW received verbal permission from client on 07/03/16 for CSW to speak with client about current client needs.  Client sees Dr. Claretta Fraise as primary care doctor. Client is receiving in home care support with [facility].  Client does live alone and has reported that she has reduced support system.  Client has [employer]. She is receiving home health physical therapy support in the home. She is receiving home health nursing support in the home. Client said she had her prescribed medications and is taking medications as prescribed. She said she [facility] her [facility] from [facility].  She said she is eating adequately and sleeping adequately. Client said she has some support from her daughter, [name] Walker. [name] Walker lives near client and visits client regularly to check on client needs and status. Client uses a walker to assist her in ambulation. CSW and client spoke of client care plan. CSW encouraged that client participate in all scheduled home health physical therapy sessions for client in next 30 days with [name].  Client said she has a lift chair she uses daily. She said she uses lift chair to elevate her feet as needed and to help her stand up. Client tries to maintain her home indedpendently. Client said that her family helps transport her to and from her scheduled medical appointments.  Client did not mention any pain issues.  She said she uses glasses to help her read.  Client said she has adequate food supply. Client said she sometimes goes into community to have a meal with her friends. CSW encouraged client to socialize as able with her friends, such as enjoying meals in community with friends. Client said she enjoyed going to local restaurant to eat with her friends.   Client watches TV to relax. She said she plays piano to relax. Client said she uses oxygen as prescribed at  night to help her breathe well.  Client said she is using nebulizer for breathing treatments as needed. Client requested of RN [name] Walker recently that [facility] staff communicate with client only and not with client's family.  Client said, however, that her daughter, [name] Walker, is nearby and checks on client regularly and helps client with daily client needs. Client wants to be independent if possible. CSW thanked client for phone call with CSW on 07/03/16. CSW invited [name] Walker to call CSW at [phone] as needed to discuss social work needs of client.   Plan:  Client to participate in all scheduled home health  physical therapy sessions for client in next 30 days with [name].  CSW to collaborate with RN [name] Walker in monitoring needs of client.  CSW to call client in 4 weeks to assess client needs.  [name] Walker.[name] Walker MSW, LCSW Licensed Clinical Social Worker [facility] Care Management [phone]

## 2016-07-09 ENCOUNTER — Other Ambulatory Visit: Payer: Self-pay | Admitting: *Deleted

## 2016-07-09 ENCOUNTER — Encounter: Payer: Self-pay | Admitting: *Deleted

## 2016-07-09 NOTE — Patient Outreach (Signed)
[facility]) Care Management   07/09/2016  [name] Walker 1925/04/06 [id number]  [name] Walker is an 82 y.o. female  Subjective: Routine home visit with pt, HIPAA verified, pt reports she is doing well, is wearing compression hose "every day and swelling is gone, legs look better"  Pt reports she has all medications and taking as prescribed prefilled by her local pharmacy. Pt states she still drives and went to church yesterday.  Objective:   Vitals:   07/09/16 1351  BP: 132/60  Pulse: 60  Resp: 18  SpO2: 96%   ROS  Physical Exam  Constitutional: She is oriented to person, place, and time. She appears well-developed and well-nourished.  HENT:  Head: Normocephalic.  Neck: Normal range of motion. Neck supple.  Cardiovascular: Normal rate.   Respiratory: Effort normal and breath sounds normal.  GI: Soft. Bowel sounds are normal.  Musculoskeletal: Normal range of motion. She exhibits no edema.  Pt wearing compression hose lower extremities bil Lower extremities discolored, purplish color (chronic)  Neurological: She is alert and oriented to person, place, and time.  Skin: Skin is warm and dry.  Psychiatric: She has a normal mood and affect. Her behavior is normal. Judgment and thought content normal.    Encounter Medications:   Outpatient Encounter Prescriptions as of 07/09/2016  Medication Sig  . albuterol (PROVENTIL) (2.5 MG/3ML) 0.083% nebulizer solution Take 3 mLs (2.5 mg total) by nebulization every 2 (two) hours as needed for wheezing or shortness of breath.  Marland Kitchen amitriptyline (ELAVIL) 75 MG tablet TAKE 1 TABLETS AT BEDTIME  . Calcium Carbonate-Vitamin D (CALCIUM-VITAMIN D) 500-200 MG-UNIT tablet Take 1 tablet by mouth every morning.  . citalopram (CELEXA) 20 MG tablet TAKE 1 TABLET DAILY  . donepezil (ARICEPT) 10 MG tablet TAKE ONE TABLET AT BEDTIME  . furosemide (LASIX) 40 MG tablet TAKE (2) TABLETS DAILY  . gabapentin (NEURONTIN) 300 MG  capsule TAKE (1) CAPSULE TWICE DAILY.  . hydrOXYzine (ATARAX/VISTARIL) 10 MG tablet TAKE (1) TABLET THREE TIMES DAILY.  Marland Kitchen ibuprofen (ADVIL,MOTRIN) 200 MG tablet Take 200 mg by mouth every 6 (six) hours as needed for moderate pain.  Marland Kitchen levocetirizine (XYZAL) 5 MG tablet Take 1 tablet (5 mg total) by mouth at bedtime.  . mupirocin cream (BACTROBAN) 2 % Apply topically daily. Apply to right ankle daily then cover with foam dressing  . omeprazole (PRILOSEC) 20 MG capsule TAKE 2 CAPSULES DAILY AS NEEDED FOR REFLUX  . OXYGEN Inhale into the lungs at bedtime.  . potassium chloride (K-DUR) 10 MEQ tablet TAKE 4 TABLETS ONCE DAILY (Patient taking differently: TAKE 4 TABLETS ONCE DAILY IN THE MORNING)  . rOPINIRole (REQUIP) 1 MG tablet TAKE 1 OR 2 TABLETS AT BEDTIME  . triamcinolone cream (KENALOG) 0.1 % Apply 1 application topically daily.  . verapamil (CALAN-SR) 180 MG CR tablet TAKE ONE TABLET AT BEDTIME  . Vitamin D-Vitamin K (VITAMIN K2-VITAMIN D3 PO) Take 2 capsules by mouth every morning.  . warfarin (COUMADIN) 2 MG tablet 2 tablets po daily except 1 tablet on tueday and thursday  . verapamil (VERELAN PM) 180 MG 24 hr capsule Take 180 mg by mouth at bedtime.   No facility-administered encounter medications on file as of 07/09/2016.     Functional Status:   In your present state of health, do you have any difficulty performing the following activities: 06/21/2016 06/12/2016  Hearing? N N  Vision? N N  Difficulty concentrating or making decisions? N N  Walking or climbing  stairs? Y Y  Dressing or bathing? Y Y  Doing errands, shopping? [name]  Preparing Food and eating ? - Y  Using the Toilet? N N  In the past six months, have you accidently leaked urine? Y N  Do you have problems with loss of bowel control? N N  Managing your Medications? Y Y  Managing your Finances? [name]  Housekeeping or managing your Housekeeping? [name]  Some recent data might be hidden    Fall/Depression Screening:    PHQ  2/9 Scores 06/21/2016 06/18/2016 06/12/2016 05/22/2016 04/06/2016 08/15/2015 07/22/2015  PHQ - 2 Score 0 0 2 0 1 0 0  PHQ- 9 Score - - 8 - - - -    Assessment:  Pt has life alert wrist bracelet and verbalizes understanding of use, pt is using walker, RN CM emphasized safety precautions.  Reviewed importance of taking medication as prescribed,  RN CM praised and encouraged pt for wearing compression hose daily.  RN CM discussed discharge plan and will see pt next month and plan to discharge from community.  CSW continues to work with pt.  [facility] CM Care Plan Problem One   Flowsheet Row Most Recent Value  Care Plan Problem One  Knowledge deficit related to cellulits  Role Documenting the Problem One  Care Management Coordinator  Care Plan for Problem One  Active  [facility]N Long Term Goal (31-90 days)  pt will verbalize better understanding related to cellulitis aeb no hospitalizations within 90 days  [facility]N Long Term Goal Start Date  06/21/16  Interventions for Problem One Long Term Goal  RN CM reinforced signs/ symptoms cellutlits/ infection and reportable signs/ symptoms/ action plan  [facility] CM Short Term Goal #1 (0-30 days)  pt will wear compression hose consistenly within 30 days  [facility] CM Short Term Goal #1 Start Date  06/21/16  [facility] CM Short Term Goal #1 Met Date  07/09/16  Interventions for Short Term Goal #1  RN CM praised and encouraged pt for wearing compression hose daily    [facility] CM Care Plan Problem Two   Flowsheet Row Most Recent Value  Care Plan Problem Two  Knowledge deficit related to COPD  Role Documenting the Problem Two  Care Management Coordinator  Care Plan for Problem Two  Active  [facility] CM Short Term Goal #1 (0-30 days)  pt will verbalize COPD action plan/ zones at next visit within 30 days  [facility] CM Short Term Goal #1 Start Date  07/21/16 [goal restarted]  Interventions for Short Term Goal #2   RN CM reinforced COPD zones/ action plan with emphasis on yellow zone and importance of knowing how you  feel each day.      Plan: follow up with home visit 11/ 13/17  [name] [facility], [place] Coordinator [phone]

## 2016-07-12 ENCOUNTER — Ambulatory Visit: Payer: Self-pay | Admitting: *Deleted

## 2016-07-12 ENCOUNTER — Telehealth: Payer: Self-pay | Admitting: Pharmacist

## 2016-07-12 NOTE — Telephone Encounter (Signed)
Patient is due INR recheck.  Called to make appt and patient is coming tomorrow to see Dr Livia Snellen.  She will have INR checked then. [facility] is still coming 1-2 times per week.  Recommend have them recheck for her at home in 1-2 weeks based on what INR is tomorrow.

## 2016-07-13 ENCOUNTER — Telehealth: Payer: Self-pay | Admitting: *Deleted

## 2016-07-13 ENCOUNTER — Ambulatory Visit (INDEPENDENT_AMBULATORY_CARE_PROVIDER_SITE_OTHER): Payer: Commercial Managed Care - HMO | Admitting: Family Medicine

## 2016-07-13 ENCOUNTER — Encounter: Payer: Self-pay | Admitting: Family Medicine

## 2016-07-13 VITALS — BP 109/56 | HR 79 | Temp 96.8°F | Ht <= 58 in | Wt 163.1 lb

## 2016-07-13 DIAGNOSIS — Z7901 Long term (current) use of anticoagulants: Secondary | ICD-10-CM

## 2016-07-13 DIAGNOSIS — I482 Chronic atrial fibrillation, unspecified: Secondary | ICD-10-CM

## 2016-07-13 DIAGNOSIS — I10 Essential (primary) hypertension: Secondary | ICD-10-CM | POA: Diagnosis not present

## 2016-07-13 DIAGNOSIS — Z9981 Dependence on supplemental oxygen: Secondary | ICD-10-CM

## 2016-07-13 DIAGNOSIS — Z86718 Personal history of other venous thrombosis and embolism: Secondary | ICD-10-CM

## 2016-07-13 DIAGNOSIS — Z23 Encounter for immunization: Secondary | ICD-10-CM

## 2016-07-13 DIAGNOSIS — J439 Emphysema, unspecified: Secondary | ICD-10-CM

## 2016-07-13 LAB — COAGUCHEK XS/INR WAIVED
INR: 1.5 — AB (ref 0.9–1.1)
Prothrombin Time: 17.5 s

## 2016-07-13 NOTE — Progress Notes (Signed)
Subjective:  Patient ID: [name] Walker, female    DOB: [birth year]  Age: 82 y.o. MRN: [id number]  CC: Emphysema (pt here today for her routine follow up on her Emphysema and has a form for her oxygen that needs to be filled out)   HPI [name] Walker presents for Breathing much better. She is at baseline. She is comfortable at rest sitting up. However at night she is unable to lay flat without oxygen. She is due for recertification apparently and needs pulse ox readings etc. to show that she still needs oxygen to avoid getting billed personally for her concentrator and thanks. She brings in a statement from her insurance company asking what to do about that. At this time she is able to sit without difficulty but as soon as she gets up to walk without using her oxygen she is unable to go more than a few steps without feeling winded and having her pulse ox dropped.  Additionally she states that her swelling has gone down as recently caused by her DVT and hospitalization. and her pain is gone and she continues to use her Coumadin. Patient in for follow-up INR reading. Patient denies any recent excessive bleeding episodes including epistaxis, bleeding from the gums, genitalia, rectal bleeding or hematuria. Additionally there has been no excessive bruising.   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   She has a past surgical history that includes Total hip arthroplasty; Knee Arthroplasty; Esophagogastroduodenoscopy (10/17/2007); Colonoscopy; Back surgery; Abdominal hysterectomy; Cholecystectomy; Cataracts; and Parotidectomy (Left, 05/17/2015).   Her family history includes  Cancer in her sister; Rheum arthritis in her mother; Ulcers in her father and mother.She reports that she has never smoked. She has never used smokeless tobacco. She reports that she does not drink alcohol or use drugs.    ROS Review of Systems  Constitutional: Negative for activity change, appetite change and fever.  HENT: Negative for congestion, rhinorrhea and sore throat.   Eyes: Negative for visual disturbance.  Respiratory: Positive for shortness of breath. Negative for cough and wheezing.   Cardiovascular: Negative for chest pain, palpitations and leg swelling.  Gastrointestinal: Negative for abdominal pain, diarrhea and nausea.  Genitourinary: Negative for dysuria.  Musculoskeletal: Positive for arthralgias. Negative for myalgias.    Objective:  BP (!) 109/56   Pulse 79   Temp (!) 96.8 F (36 C) (Oral)   Ht 4\' 10"  (1.473 m)   Wt 163 lb 2 oz (74 kg)   BMI 34.09 kg/m   BP Readings from Last 3 Encounters:  07/13/16 (!) 109/56  07/09/16 132/60  06/21/16 (!) 108/58    Wt Readings from Last 3 Encounters:  07/13/16 163 lb 2 oz (74 kg)  06/21/16 157 lb (71.2 kg)  06/18/16 160 lb (72.6 kg)     Physical Exam  Constitutional: She is oriented to person, place, and time. She appears well-developed and well-nourished. No distress.  HENT:  Head: Normocephalic and atraumatic.  Right Ear: External ear normal.  Left Ear: External ear normal.  Nose: Nose normal.  Mouth/Throat: Oropharynx is clear and moist and mucous membranes are normal.  She is edentulous, leaving her speech somewhat unclear.  Eyes: Conjunctivae and EOM are normal. Pupils are equal, round, and reactive to light.  Neck:  Normal range of motion. Neck supple. No thyromegaly present.  Cardiovascular: Normal rate, regular rhythm, normal heart sounds and intact distal pulses.   No murmur heard. Pulmonary/Chest: Effort normal and breath sounds normal. No respiratory distress. She has no wheezes. She has no rales.    Abdominal: Soft. Bowel sounds are normal. She exhibits no distension. There is no tenderness.  Lymphadenopathy:    She has no cervical adenopathy.  Neurological: She is alert and oriented to person, place, and time. She has normal reflexes.  Skin: Skin is warm and dry.  Psychiatric: She has a normal mood and affect. Her behavior is normal. Judgment and thought content normal.     Lab Results  Component Value Date   WBC 5.0 05/24/2016   HGB 9.7 (L) 05/24/2016   HCT 30.3 (L) 05/24/2016   PLT 200 05/24/2016   GLUCOSE 97 05/25/2016   CHOL 180 05/13/2015   TRIG 161 (H) 05/13/2015   HDL 62 05/13/2015   LDLCALC 86 05/13/2015   ALT 16 05/23/2016   AST 32 05/23/2016   NA 140 05/25/2016   K 3.6 05/25/2016   CL 110 05/25/2016   CREATININE 0.90 05/25/2016   BUN 12 05/25/2016   CO2 25 05/25/2016   TSH 1.870 05/13/2015   INR 1.5 (H) 07/13/2016    Ct Head Wo Contrast  Result Date: 05/23/2016 CLINICAL DATA:  Atrial fibrillation. On anti coagulation. Status post fall. EXAM: CT HEAD WITHOUT CONTRAST CT CERVICAL SPINE WITHOUT CONTRAST TECHNIQUE: Multidetector CT imaging of the head and cervical spine was performed following the standard protocol without intravenous contrast. Multiplanar CT image reconstructions of the cervical spine were also generated. COMPARISON:  Head CT 04/18/2015 and neck CT 04/18/2015 FINDINGS: CT HEAD FINDINGS There is no mass lesion, intraparenchymal hemorrhage or extra-axial collection. No evidence of acute cortical infarct. No hydrocephalus. 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 Brain parenchyma otherwise normal for age. Normal ventricles and sulci. The paranasal sinuses and mastoids are free of fluid. The orbits are normal. Normal skull and visualized extracranial soft tissues. CT CERVICAL SPINE FINDINGS There is a minimally displaced fracture of the anterior superior corner of the C5 vertebral body (series 8, image 39). No static subluxation. Facets are normally  aligned. The occipital condyles are aligned. The dens is intact.There is extensive facet arthrosis with moderate to severe narrowing of the neural foramina at multiple levels. No advanced spinal canal stenosis.Visualized paraspinal soft tissues and lung apices are normal. IMPRESSION: 1. Minimally displaced fracture of the anterior superior corner of the C5 vertebral body. This is somewhat age indeterminate, but was not present on neck CT of 04/18/2015 and is favored to be acute. 2. No acute intracranial abnormality. Electronically Signed   By: Ulyses Jarred M.D.   On: 05/23/2016 02:22   Ct Cervical Spine Wo Contrast  Result Date: 05/23/2016 CLINICAL DATA:  Atrial fibrillation. On anti coagulation. Status post fall. EXAM: CT HEAD WITHOUT CONTRAST CT CERVICAL SPINE WITHOUT CONTRAST TECHNIQUE: Multidetector CT imaging of the head and cervical spine was performed following the standard protocol without intravenous contrast. Multiplanar CT image reconstructions of the cervical spine were also generated. COMPARISON:  Head CT 04/18/2015 and neck CT 04/18/2015 FINDINGS: CT HEAD FINDINGS There is no mass lesion, intraparenchymal hemorrhage or extra-axial collection. No evidence of acute cortical infarct. No hydrocephalus. 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 Brain parenchyma otherwise normal for age. Normal ventricles and sulci. The paranasal sinuses and mastoids are free of fluid. The orbits are normal. Normal skull and  visualized extracranial soft tissues. CT CERVICAL SPINE FINDINGS There is a minimally displaced fracture of the anterior superior corner of the C5 vertebral body (series 8, image 39). No static subluxation. Facets are normally aligned. The occipital condyles are aligned. The dens is intact.There is extensive facet arthrosis with moderate to severe narrowing of the neural foramina at multiple levels. No advanced spinal canal stenosis.Visualized paraspinal soft tissues and lung apices are normal.  IMPRESSION: 1. Minimally displaced fracture of the anterior superior corner of the C5 vertebral body. This is somewhat age indeterminate, but was not present on neck CT of 04/18/2015 and is favored to be acute. 2. No acute intracranial abnormality. Electronically Signed   By: Ulyses Jarred M.D.   On: 05/23/2016 02:22   Dg Chest Portable 1 View  Result Date: 05/23/2016 CLINICAL DATA:  [place] at home tonight.  Found on floor. EXAM: PORTABLE CHEST 1 VIEW COMPARISON:  07/28/2015 FINDINGS: There is mild unchanged cardiomegaly. The lungs are clear. There is no large effusion. Mediastinal and hilar contours are unremarkable and unchanged. Incidental findings include chronic arthropathy of both shoulders. IMPRESSION: Unchanged cardiomegaly.  No acute findings. Electronically Signed   By: Andreas Newport M.D.   On: 05/23/2016 01:40    Assessment & Plan:   [name] Walker was seen today for emphysema.  Diagnoses and all orders for this visit:  Atrial fibrillation, chronic ([place]) -     CoaguChek XS/INR Waived  Encounter for immunization -     Flu Vaccine QUAD 36+ mos IM  History of deep vein thrombosis of lower extremity -     CoaguChek XS/INR Waived  Chronic anticoagulation -     CoaguChek XS/INR Waived  Pulmonary emphysema, unspecified emphysema type (H[facility])  Essential hypertension  Supplemental oxygen dependent   Anticoagulation Dose Instructions as of 07/13/2016      Dorene Grebe Tue Wed Thu Fri Sat   New Dose 4 mg 4 mg 4 mg 4 mg 4 mg 4 mg 4 mg    Description   Take 3 tabs today     I am having Ms. Hostetler maintain her albuterol, levocetirizine, donepezil, gabapentin, amitriptyline, hydrOXYzine, potassium chloride, triamcinolone cream, calcium-vitamin D, Vitamin D-Vitamin K (VITAMIN K2-VITAMIN D3 PO), OXYGEN, ibuprofen, mupirocin cream, citalopram, warfarin, verapamil, rOPINIRole, omeprazole, furosemide, and verapamil.  Patient was tested on room air seated with an oxygenation of 92. She was  tested with minimal exertion walking in the hallway and oxygen saturation dropped to 87%.   Follow-up: Return in about 1 month (around 08/13/2016) for With Tammy Eckard for INR. And with me in 3 months, COPD.  Claretta Fraise, M.D.

## 2016-07-14 DIAGNOSIS — Z9981 Dependence on supplemental oxygen: Secondary | ICD-10-CM | POA: Insufficient documentation

## 2016-07-14 NOTE — Telephone Encounter (Signed)
Patient aware and verbalizes understanding. 

## 2016-07-14 NOTE — Telephone Encounter (Signed)
Needs to change warfarin dosing

## 2016-07-16 ENCOUNTER — Telehealth: Payer: Self-pay | Admitting: Pharmacist

## 2016-07-16 MED ORDER — WARFARIN SODIUM 2 MG PO TABS: ORAL_TABLET | ORAL | 1 refills | Status: DC

## 2016-07-16 NOTE — Telephone Encounter (Signed)
[name] Walker pharmacy packages [name] Walker - notified pharmacy of med change from Friday 07/13/2016 - warfarin increased to 4mg  daily.  Also contacted [name] Walker with [facility] and ordered recheck INR the next time she sees [name] Walker which will be 07/25/2016.

## 2016-07-24 ENCOUNTER — Other Ambulatory Visit: Payer: Self-pay | Admitting: Family Medicine

## 2016-07-25 ENCOUNTER — Ambulatory Visit: Payer: Self-pay | Admitting: Pharmacist

## 2016-07-25 ENCOUNTER — Telehealth: Payer: Self-pay | Admitting: *Deleted

## 2016-07-25 LAB — POCT INR: INR: 1.3

## 2016-07-25 MED ORDER — WARFARIN SODIUM 5 MG PO TABS: 5.0000 mg | ORAL_TABLET | Freq: Every day | ORAL | 1 refills | Status: DC

## 2016-07-25 NOTE — Telephone Encounter (Signed)
Patient denies any missed doses and I verified current dose with her pharmacy.  Increase warfarin to 5mg  qd except 2.5mg  on Wednesdays.  Patient notified, [place] notified and Otila Kluver with [facility] notified. Patient has follow up appt for 07/31/16 (she was unable to come sooner due to other medical appts)

## 2016-07-25 NOTE — Telephone Encounter (Signed)
Protime 15.0 INR 1.3

## 2016-07-26 ENCOUNTER — Other Ambulatory Visit: Payer: Self-pay | Admitting: Family Medicine

## 2016-07-31 ENCOUNTER — Ambulatory Visit (INDEPENDENT_AMBULATORY_CARE_PROVIDER_SITE_OTHER): Payer: Commercial Managed Care - HMO | Admitting: Pharmacist

## 2016-07-31 ENCOUNTER — Other Ambulatory Visit: Payer: Self-pay | Admitting: Family Medicine

## 2016-07-31 DIAGNOSIS — Z86718 Personal history of other venous thrombosis and embolism: Secondary | ICD-10-CM

## 2016-07-31 DIAGNOSIS — I482 Chronic atrial fibrillation, unspecified: Secondary | ICD-10-CM

## 2016-07-31 DIAGNOSIS — Z7901 Long term (current) use of anticoagulants: Secondary | ICD-10-CM | POA: Diagnosis not present

## 2016-07-31 LAB — COAGUCHEK XS/INR WAIVED
INR: 1.2 — ABNORMAL HIGH (ref 0.9–1.1)
Prothrombin Time: 14.9 s

## 2016-07-31 MED ORDER — APIXABAN 5 MG PO TABS: 5.0000 mg | ORAL_TABLET | Freq: Two times a day (BID) | ORAL | 1 refills | Status: DC

## 2016-07-31 NOTE — Progress Notes (Signed)
Patient ID: [name] Walker, female   DOB: 1925/06/26, 82 y.o.   MRN: [id number]  Subjective:     Indication: atrial fibrillation Bleeding signs/symptoms: None Thromboembolic signs/symptoms: None  Missed Coumadin doses: None - meds filled in pill container for patient by pharmacy Medication changes: no Dietary changes: no Bacterial/viral infection: yes - last ABX about 2 or 3 weeks ago Other concerns: no   Objective:    INR Today: 1.2 Current dose: warfarin 5mg  take 1/2 tablet wednesdays and 1 tablet all other days.  Assessment:    Subtherapeutic INR for goal of 2-3  - continues to have variable INR  Plan:    1. New dose: discontinue warfarin    Restart Eliquis 5mg  take 1 tablet bid - will send to pharmacy to fill her pill container / dose pack.  #5 weeks for free.   2.  Patient given application for LIS for Medicare  3. Next INR: no need since switching to eliquis - F/U with PCP in 2 months as planned

## 2016-08-06 ENCOUNTER — Encounter: Payer: Self-pay | Admitting: *Deleted

## 2016-08-06 ENCOUNTER — Other Ambulatory Visit: Payer: Self-pay | Admitting: Licensed Clinical Social Worker

## 2016-08-06 ENCOUNTER — Other Ambulatory Visit: Payer: Self-pay | Admitting: *Deleted

## 2016-08-06 NOTE — Patient Outreach (Signed)
[facility]) Care Management   08/06/2016  [name] Walker [birth year] [id number]  [name] Walker is an 82 y.o. female  Subjective: Routine home visit with pt, HIPAA verified, pt reports no changes to report, continues wearing compression hose daily, pt states she still drives, continues to have prepackaged medications from [facility] and reports taking all medications as prescribed.   Continues to have PT/ INR checked at primary MD office.  Objective:   Vitals:   08/06/16 1246  BP: 122/68  Pulse: 67  Resp: 18  SpO2: 97%   ROS  Physical Exam  Constitutional: She is oriented to person, place, and time. She appears well-developed and well-nourished.  HENT:  Head: Normocephalic.  Neck: Normal range of motion. Neck supple.  Cardiovascular: Normal rate.   Respiratory: Effort normal and breath sounds normal.  GI: Soft. Bowel sounds are normal.  Musculoskeletal: Normal range of motion.  Pt wearing compression hose LE BL  Neurological: She is alert and oriented to person, place, and time.  Skin: Skin is warm and dry.  Psychiatric: She has a normal mood and affect. Her behavior is normal. Judgment and thought content normal.    Encounter Medications:   Outpatient Encounter Prescriptions as of 08/06/2016  Medication Sig  . albuterol (PROVENTIL) (2.5 MG/3ML) 0.083% nebulizer solution Take 3 mLs (2.5 mg total) by nebulization every 2 (two) hours as needed for wheezing or shortness of breath.  Marland Kitchen amitriptyline (ELAVIL) 75 MG tablet TAKE 1 TABLETS AT BEDTIME  . apixaban (ELIQUIS) 5 MG TABS tablet Take 1 tablet (5 mg total) by mouth 2 (two) times daily.  . Calcium Carbonate-Vitamin D (CALCIUM-VITAMIN D) 500-200 MG-UNIT tablet Take 1 tablet by mouth every morning.  . citalopram (CELEXA) 20 MG tablet TAKE 1 TABLET DAILY  . donepezil (ARICEPT) 10 MG tablet TAKE ONE TABLET AT BEDTIME  . furosemide (LASIX) 40 MG tablet TAKE (2) TABLETS DAILY  . gabapentin  (NEURONTIN) 300 MG capsule TAKE (1) CAPSULE TWICE DAILY.  . hydrOXYzine (ATARAX/VISTARIL) 10 MG tablet TAKE (1) TABLET THREE TIMES DAILY.  Marland Kitchen ibuprofen (ADVIL,MOTRIN) 200 MG tablet Take 200 mg by mouth every 6 (six) hours as needed for moderate pain.  Marland Kitchen levocetirizine (XYZAL) 5 MG tablet Take 1 tablet (5 mg total) by mouth at bedtime.  . mupirocin cream (BACTROBAN) 2 % Apply topically daily. Apply to right ankle daily then cover with foam dressing  . omeprazole (PRILOSEC) 20 MG capsule TAKE 2 CAPSULES DAILY AS NEEDED FOR REFLUX  . OXYGEN Inhale into the lungs at bedtime.  . potassium chloride (K-DUR) 10 MEQ tablet TAKE 4 TABLETS ONCE DAILY (Patient taking differently: TAKE 4 TABLETS ONCE DAILY IN THE MORNING)  . rOPINIRole (REQUIP) 1 MG tablet TAKE 1 OR 2 TABLETS AT BEDTIME  . triamcinolone cream (KENALOG) 0.1 % Apply 1 application topically daily.  . verapamil (CALAN-SR) 180 MG CR tablet TAKE ONE TABLET AT BEDTIME  . Vitamin D-Vitamin K (VITAMIN K2-VITAMIN D3 PO) Take 2 capsules by mouth every morning.  . verapamil (VERELAN PM) 180 MG 24 hr capsule Take 180 mg by mouth at bedtime.   No facility-administered encounter medications on file as of 08/06/2016.     Functional Status:   In your present state of health, do you have any difficulty performing the following activities: 06/21/2016 06/12/2016  Hearing? N N  Vision? N N  Difficulty concentrating or making decisions? N N  Walking or climbing stairs? Y Y  Dressing or bathing? [name]  Doing  errands, shopping? [name]  Preparing Food and eating ? - Y  Using the Toilet? N N  In the past six months, have you accidently leaked urine? Y N  Do you have problems with loss of bowel control? N N  Managing your Medications? Y Y  Managing your Finances? [name]  Housekeeping or managing your Housekeeping? [name]  Some recent data might be hidden    Fall/Depression Screening:    PHQ 2/9 Scores 07/13/2016 06/21/2016 06/18/2016 06/12/2016 05/22/2016 04/06/2016  08/15/2015  PHQ - 2 Score 0 0 0 2 0 1 0  PHQ- 9 Score - - - 8 - - -    Assessment:  Pt ambulating with walker, is able to take medications with prefilled package from pharmacy, pt still not willing to involve immediate family members in the conversation regarding plan of care, does not give permission for RN CM to speak with family members.  Pt still drives and her family does assist her as needed.  Pt wants to remain independent in her own home and likes doing things her way.  Pt feels there are no other needs for care management, RN or CSW, pt agreeable with discharge today. RN CM sent In Basket to [place] and informed pt states she has no further CSW needs.    [facility] CM Care Plan Problem One   Flowsheet Row Most Recent Value  Care Plan Problem One  Knowledge deficit related to cellulits  Role Documenting the Problem One  Care Management Coordinator  Care Plan for Problem One  Active  [facility] Long Term Goal (31-90 days)  pt will verbalize better understanding related to cellulitis aeb no hospitalizations within 90 days  [facility] Long Term Goal Start Date  06/21/16  [facility] Long Term Goal Met Date  08/06/16  Interventions for Problem One Long Term Goal  RN CM reminded pt of importance of wearing compression hose daily, praised and encouraged pt for wearing hose daily.    [facility] CM Care Plan Problem Two   Flowsheet Row Most Recent Value  Care Plan Problem Two  Knowledge deficit related to COPD  Role Documenting the Problem Two  Care Management Coordinator  Care Plan for Problem Two  Active  [facility] CM Short Term Goal #1 (0-30 days)  pt will verbalize COPD action plan/ zones at next visit within 30 days  [facility] CM Short Term Goal #1 Start Date  07/21/16 [goal restarted]  [facility] CM Short Term Goal #1 Met Date   08/06/16  Interventions for Short Term Goal #2   RN CM reiterated COPD zones/ action plan with emphasis on yellow zone and importance of knowing how you feel each day.      Plan: close case today CSW  continues  [name] [facility], BSN [place] Coordinator [phone]

## 2016-08-06 NOTE — Patient Outreach (Signed)
Assessment:  CSW spoke via phone with client. CSW verified client identity. CSW and client spoke of client needs.  Client sees Dr. Claretta Fraise as primary care doctor. Client is receiving in home care support with [facility]. Client is receiving [place] from [facility].  Client does live alone. She has reported that she has reduced support system. She has [employer].  Client has prescribed medications and is taking medications as prescribed. Client has some support from her daughter, Arti Kose, who lives near client's residence.  Client is using a walker to help her with ambulation. She has a lift chair she uses in her home. She said she is eating well and sleeping well. CSW and client spoke of client care plan. CSW encouraged client to communicate with CSW in next 30 days to discuss community resources of assistance for client. Client said that her family members help transport her to and from her scheduled medical appointments. Client said she sometimes goes with her friends into the community to share a meal together. She said that sharing a meal with friends in the community is relaxing to her.  Client said she also watches TV or plays the piano to relax.  Client said she uses oxygen as prescribed at night to help her with breathing. She said she takes nebulizer treatments as prescribed. She said she sometimes becomes short of breath upon walking. She has talked with her primary care doctor about her shortness of breath and about her fatigue upon walking or exertion.  Client enjoys attending church and she said she plays the piano at church.  Client has Life Alert bracelet she wears to use as needed. Client said she will have [employer] insurance through December of 2017. She said she will have [facility] starting on September 24, 2016.  CSW spoke with client about [place], nursing and social work program. [place] encouraged [place] to call CSW at  [phone] as needed to discuss social work needs of client. Client was appreciative of call from [place] on 08/06/16.   Plan:  Client to communicate with CSW in next 30 days to discuss community resources of assistance for client..  CSW to call client in 4 weeks to assess client needs at that time.  [name].Ragan Duhon MSW, LCSW Licensed Clinical Social Worker [facility] Care Management [phone]

## 2016-08-22 ENCOUNTER — Other Ambulatory Visit: Payer: Self-pay | Admitting: Family Medicine

## 2016-08-30 ENCOUNTER — Other Ambulatory Visit: Payer: Self-pay | Admitting: Family Medicine

## 2016-09-04 ENCOUNTER — Other Ambulatory Visit: Payer: Self-pay | Admitting: Licensed Clinical Social Worker

## 2016-09-04 ENCOUNTER — Other Ambulatory Visit: Payer: Self-pay | Admitting: Family Medicine

## 2016-09-04 NOTE — Patient Outreach (Signed)
Assessment:  CSW spoke via phone with client.  CSW verified client identity. CSW and client spoke of client needs. Client sees Dr. Livia Snellen as primary care doctor. Client has prescribed medications and is taking medications as prescribed.  Client does live alone. She had been receiving in home nursing support with RN from [place], as scheduled .  Client said she is no longer receiving in home nursing support with RN from [facility]. .  Client has some support from [facility], client's daughter, who lives nearby.  Client has a walker she uses for ambulation. She has a lift chair she uses as needed in the home. Client has [employer]. Client and CSW spoke of client care plan. CSW encouraged client to communicate with CSW in next 30 days to discuss community resources of assistance for client. Client said that her family members transport her to and from client's scheduled medical appointments. Client said she uses oxygen as prescribed at night to help her with breathing. She said she relaxes by watching TV, playing the piano, or by going out to eat with friends. Client wears Life Alert bracelet to use as needed. She enjoys going to church. Client said she will have [employer] insurance through December of 2017. She said starting on September 24, 2016 she will have insurance coverage through [facility]. Client said she did not use   a hearing aid. She said she has talked with her primary doctor about current pain issues she faces.  Client said she wants to remain at her home as she is able with needed supports in place. Client said she has adequate food supply . Client said that her oldest daughter passed away in 07/18/2016.  She said that she was still adjusting to this loss. She said she has many friends at church and has talked with friends at church about the loss of her daughter in 07-18-2016. She said she enjoys the Christmas season. She said she enjoys playing the piano  as she is able. CSW reminded client that [facility] program has support services in social work, nursing and pharmacy area. CSW encouraged client to call CSW at [phone] as needed to discuss social work needs of client. Client was very appreciative of call from [place] on 09/04/16.    Plan:  Client to communicate with CSW in next 30 days to discuss community resources of assistance for client.  CSW to call client in 4 weeks to assess client needs at that time.   [name].Tekla Malachowski MSW, LCSW Licensed Clinical Social Worker [facility] Care Management [phone]

## 2016-09-05 ENCOUNTER — Telehealth: Payer: Self-pay | Admitting: Pharmacist

## 2016-09-05 MED ORDER — RIVAROXABAN 20 MG PO TABS: 20.0000 mg | ORAL_TABLET | Freq: Every day | ORAL | 0 refills | Status: DC

## 2016-09-05 NOTE — Telephone Encounter (Signed)
Eliquis too expensive.  Needs samples of alternative to get through 2017.  Switch to Xarelto 20mg  qd with evening meal - we had 30 day coupon.  In January 2018 will switch back to Eliquis as insurance will pay again.  Paperwork given to daughter to apply for Lower income subsidy  If does not qualify then will try for assistance from drug manufacturer when reaches coverage gap.

## 2016-09-21 ENCOUNTER — Other Ambulatory Visit: Payer: Self-pay | Admitting: Family Medicine

## 2016-09-24 DIAGNOSIS — J449 Chronic obstructive pulmonary disease, unspecified: Secondary | ICD-10-CM | POA: Diagnosis not present

## 2016-09-27 ENCOUNTER — Other Ambulatory Visit: Payer: Self-pay | Admitting: Family Medicine

## 2016-10-02 ENCOUNTER — Other Ambulatory Visit: Payer: Self-pay | Admitting: Family Medicine

## 2016-10-03 ENCOUNTER — Ambulatory Visit: Payer: Commercial Managed Care - HMO | Admitting: Family Medicine

## 2016-10-05 ENCOUNTER — Other Ambulatory Visit: Payer: Self-pay | Admitting: Pharmacist

## 2016-10-08 ENCOUNTER — Other Ambulatory Visit: Payer: Self-pay | Admitting: Licensed Clinical Social Worker

## 2016-10-08 NOTE — Patient Outreach (Signed)
Assessment:  CSW spoke with client via phone. CSW verified client identity. CSW and client spoke of client needs. Client sees Dr. Claretta Fraise as primary care doctor. Client has appointment with Dr. Livia Snellen on 10/10/16.  Client said he had his prescribed medications and is taking medications as prescribed.  Client does live alone. She said she had completed her home health nursing services with [facility]. Client said she has some support from her daughter, Keajah Miesner. Client uses a walker to assist her in ambulation. Client has a lift chair she uses in her home as needed to assist client. Client has had [employer]. She said that her [employer] coverage lasted through December of 2017. She said that starting on 09/24/16 she was now being covered with [facility]. CSW and client spoke of client care plan. CSW encouraged client to communicate with  CSW in next 30 days to discuss community resources of assistance with client. Client said that her family members transport client to and from client's scheduled medical appointments. Client said she uses oxygen as prescribed at night to help her with breathing needs of client. Client enjoys going to church. She enjoys playing the piano for relaxation. She wears a Life Alert bracelet to use as needed by client. Client said she wants to remain at her home with needed supports in place. She said she had adequate food supply. She said that her daughter helped her today in obtaining needed food from grocery store for client.  Client has a ramp at her home to help her go in and out of her home. She said she uses walker regularly to help her walk.  Client said one of her daughters passed away in 07-12-2016. She said she is adjusting to loss of her daughter in 07-12-2016.  She said she goes to church as she is able.  RN [name] recently discharged client from [facility].   CSW encouraged client to call CSW at  [phone] as needed to discuss social work needs of client. Client was appreciative of call from [place] on 10/08/16.    Plan:  Client to communicate with CSW in next 30 days to discuss community resources of assistance with client.  CSW to call client in 4 weeks to assess client needs at that time.  [name].Kabrea Seeney MSW, LCSW Licensed Clinical Social Worker [facility] Care Management [phone]

## 2016-10-09 ENCOUNTER — Other Ambulatory Visit: Payer: Self-pay | Admitting: Family Medicine

## 2016-10-10 ENCOUNTER — Ambulatory Visit: Payer: Commercial Managed Care - HMO | Admitting: Family Medicine

## 2016-10-15 ENCOUNTER — Encounter: Payer: Self-pay | Admitting: Family Medicine

## 2016-10-15 ENCOUNTER — Other Ambulatory Visit: Payer: Self-pay | Admitting: Family Medicine

## 2016-10-15 ENCOUNTER — Ambulatory Visit (INDEPENDENT_AMBULATORY_CARE_PROVIDER_SITE_OTHER): Payer: Medicare Other | Admitting: Family Medicine

## 2016-10-15 VITALS — BP 121/76 | HR 83 | Temp 97.1°F | Ht <= 58 in | Wt 170.0 lb

## 2016-10-15 DIAGNOSIS — I10 Essential (primary) hypertension: Secondary | ICD-10-CM

## 2016-10-15 DIAGNOSIS — K589 Irritable bowel syndrome without diarrhea: Secondary | ICD-10-CM

## 2016-10-15 DIAGNOSIS — D6489 Other specified anemias: Secondary | ICD-10-CM | POA: Diagnosis not present

## 2016-10-15 DIAGNOSIS — G4700 Insomnia, unspecified: Secondary | ICD-10-CM | POA: Diagnosis not present

## 2016-10-15 DIAGNOSIS — I482 Chronic atrial fibrillation, unspecified: Secondary | ICD-10-CM

## 2016-10-15 DIAGNOSIS — E538 Deficiency of other specified B group vitamins: Secondary | ICD-10-CM

## 2016-10-15 DIAGNOSIS — J439 Emphysema, unspecified: Secondary | ICD-10-CM

## 2016-10-15 MED ORDER — BETAMETHASONE SOD PHOS & ACET 6 (3-3) MG/ML IJ SUSP
6.0000 mg | Freq: Once | INTRAMUSCULAR | Status: AC
  Administered 2016-10-15: 6 mg via INTRAMUSCULAR

## 2016-10-15 NOTE — Progress Notes (Signed)
Subjective:  Patient ID: [name] Walker, female    DOB: 1925-05-27  Age: 83 y.o. MRN: [id number]  CC: Follow-up (pt here today for her 3 month chronic medical problems)   HPI [name] Walker presents for  follow-up of hypertension. Patient has no history of headache chest pain or shortness of breath or recent cough. Patient also denies symptoms of TIA such as numbness weakness lateralizing. Patient checks  blood pressure at home and has not had any elevated readings recently. Patient denies side effects from his medication. States taking it regularly. Patient states COPD is stable. There is no shortness of breath. She does have some ongoing pain at the area of the left sided parotid surgery. Energy is fair. She is also being followed for her normocytic anemia. Due for recheck of CBC with indices. Patient in for follow-up of atrial fibrillation. Patient denies any recent bouts of chest pain or palpitations. Additionally, patient is taking anticoagulants. Patient denies any recent excessive bleeding episodes including epistaxis, bleeding from the gums, genitalia, rectal bleeding or hematuria. Additionally there has been no excessive bruising.   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   She has a past surgical history that includes Total hip arthroplasty; Knee Arthroplasty; Esophagogastroduodenoscopy (10/17/2007); Colonoscopy; Back surgery; Abdominal hysterectomy; Cholecystectomy; Cataracts; and Parotidectomy (Left, 05/17/2015).   Her family history includes Cancer in her sister; Rheum arthritis in her mother; Ulcers in her father and mother.She reports that she has  never smoked. She has never used smokeless tobacco. She reports that she does not drink alcohol or use drugs.    ROS Review of Systems  Constitutional: Negative for activity change, appetite change and fever.  HENT: Negative for congestion, rhinorrhea and sore throat.   Eyes: Negative for visual disturbance.  Respiratory: Shortness of breath: at baseline.   Cardiovascular: Negative for chest pain and palpitations.  Gastrointestinal: Negative for abdominal pain, diarrhea and nausea.  Genitourinary: Negative for dysuria.  Musculoskeletal: Negative for arthralgias and myalgias.    Objective:  BP 121/76   Pulse 83   Temp 97.1 F (36.2 C) (Oral)   Ht _0  (1.473 m)   Wt 170 lb (77.1 kg)   BMI 35.53 kg/m   BP Readings from Last 3 Encounters:  10/15/16 121/76  08/06/16 122/68  07/13/16 (!) 109/56    Wt Readings from Last 3 Encounters:  10/15/16 170 lb (77.1 kg)  07/13/16 163 lb 2 oz (74 kg)  06/21/16 157 lb (71.2 kg)     Physical Exam  Constitutional: She is oriented to person, place, and time. She appears well-developed and well-nourished. No distress.  HENT:  Head: Normocephalic and atraumatic.  Right Ear: External ear normal.  Left Ear: External ear normal.  Nose: Nose normal.  Mouth/Throat: Oropharynx is clear and moist.  Eyes: Conjunctivae and EOM are normal. Pupils are equal, round, and reactive to light.  Neck: Normal range of motion. Neck supple. No thyromegaly present.  Cardiovascular: Normal rate, regular rhythm and normal heart sounds.   No murmur heard. Pulmonary/Chest: Effort normal and breath sounds normal. No respiratory distress. She has no wheezes. She has no rales.  Abdominal: Soft. Bowel sounds are normal. She exhibits no distension. There is no tenderness.  Lymphadenopathy:    She has no  cervical adenopathy.  Neurological: She is alert and oriented to person, place, and time. She has normal reflexes.  Skin: Skin is warm and dry.  Psychiatric:  She has a normal mood and affect. Her behavior is normal. Judgment and thought content normal.    Ct Head Wo Contrast  Result Date: 05/23/2016 CLINICAL DATA:  Atrial fibrillation. On anti coagulation. Status post fall. EXAM: CT HEAD WITHOUT CONTRAST CT CERVICAL SPINE WITHOUT CONTRAST TECHNIQUE: Multidetector CT imaging of the head and cervical spine was performed following the standard protocol without intravenous contrast. Multiplanar CT image reconstructions of the cervical spine were also generated. COMPARISON:  Head CT 04/18/2015 and neck CT 04/18/2015 FINDINGS: CT HEAD FINDINGS There is no mass lesion, intraparenchymal hemorrhage or extra-axial collection. No evidence of acute cortical infarct. No hydrocephalus. 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 Brain parenchyma otherwise normal for age. Normal ventricles and sulci. The paranasal sinuses and mastoids are free of fluid. The orbits are normal. Normal skull and visualized extracranial soft tissues. CT CERVICAL SPINE FINDINGS There is a minimally displaced fracture of the anterior superior corner of the C5 vertebral body (series 8, image 39). No static subluxation. Facets are normally aligned. The occipital condyles are aligned. The dens is intact.There is extensive facet arthrosis with moderate to severe narrowing of the neural foramina at multiple levels. No advanced spinal canal stenosis.Visualized paraspinal soft tissues and lung apices are normal. IMPRESSION: 1. Minimally displaced fracture of the anterior superior corner of the C5 vertebral body. This is somewhat age indeterminate, but was not present on neck CT of 04/18/2015 and is favored to be acute. 2. No acute intracranial abnormality. Electronically Signed   By: Ulyses Jarred M.D.   On: 05/23/2016 02:22   Ct Cervical Spine Wo Contrast  Result Date: 05/23/2016 CLINICAL DATA:  Atrial fibrillation. On anti coagulation. Status post fall. EXAM: CT HEAD WITHOUT CONTRAST CT CERVICAL SPINE  WITHOUT CONTRAST TECHNIQUE: Multidetector CT imaging of the head and cervical spine was performed following the standard protocol without intravenous contrast. Multiplanar CT image reconstructions of the cervical spine were also generated. COMPARISON:  Head CT 04/18/2015 and neck CT 04/18/2015 FINDINGS: CT HEAD FINDINGS There is no mass lesion, intraparenchymal hemorrhage or extra-axial collection. No evidence of acute cortical infarct. No hydrocephalus. 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 Brain parenchyma otherwise normal for age. Normal ventricles and sulci. The paranasal sinuses and mastoids are free of fluid. The orbits are normal. Normal skull and visualized extracranial soft tissues. CT CERVICAL SPINE FINDINGS There is a minimally displaced fracture of the anterior superior corner of the C5 vertebral body (series 8, image 39). No static subluxation. Facets are normally aligned. The occipital condyles are aligned. The dens is intact.There is extensive facet arthrosis with moderate to severe narrowing of the neural foramina at multiple levels. No advanced spinal canal stenosis.Visualized paraspinal soft tissues and lung apices are normal. IMPRESSION: 1. Minimally displaced fracture of the anterior superior corner of the C5 vertebral body. This is somewhat age indeterminate, but was not present on neck CT of 04/18/2015 and is favored to be acute. 2. No acute intracranial abnormality. Electronically Signed   By: Ulyses Jarred M.D.   On: 05/23/2016 02:22   Dg Chest Portable 1 View  Result Date: 05/23/2016 CLINICAL DATA:  [place] at home tonight.  Found on floor. EXAM: PORTABLE CHEST 1 VIEW COMPARISON:  07/28/2015 FINDINGS: There is mild unchanged cardiomegaly. The lungs are clear. There is no large effusion. Mediastinal and hilar contours are unremarkable  and unchanged. Incidental findings include chronic arthropathy of both shoulders. IMPRESSION: Unchanged cardiomegaly.  No acute findings. Electronically  Signed   By: Andreas Newport M.D.   On: 05/23/2016 01:40    Assessment & Plan:   Allysia was seen today for follow-up.  Diagnoses and all orders for this visit:  Atrial fibrillation, chronic (HCC) -     CBC with Differential/Platelet -     CMP14+EGFR  Irritable bowel syndrome, unspecified type -     CMP14+EGFR  Anemia due to other cause, not classified -     CBC with Differential/Platelet  Pulmonary emphysema, unspecified emphysema type (HCC) -     CBC with Differential/Platelet -     CMP14+EGFR -     betamethasone acetate-betamethasone sodium phosphate (CELESTONE) injection 6 mg; Inject 1 mL (6 mg total) into the muscle once. -     Pulse oximetry, overnight; Future  Insomnia, unspecified type  B12 deficiency -     Vitamin B12  Essential hypertension -     CMP14+EGFR      I am having Ms. Calandro maintain her albuterol, triamcinolone cream, calcium-vitamin D, Vitamin D-Vitamin K (VITAMIN K2-VITAMIN D3 PO), OXYGEN, ibuprofen, mupirocin cream, verapamil, verapamil, omeprazole, furosemide, rOPINIRole, amitriptyline, apixaban, gabapentin, levocetirizine, citalopram, potassium chloride, hydrOXYzine, and donepezil. We administered betamethasone acetate-betamethasone sodium phosphate.  Allergies as of 10/15/2016      Reactions   Diltiazem Anxiety, Other (See Comments)   Sleep disturbance   Iohexol     Desc: THROAT SWELLING-NOTED IN CHART AFTER IVC PLACEMENT-ARS 12-02-05   Latex Other (See Comments)   unknown   Penicillins Swelling   Patient was given zosyn this admission and tolerated. After d/w MD and pt, she had swelling in arm only, so possible just infiltration and not allergic. ABx changed to Cefazolin and is tolerating   Shellfish Allergy Other (See Comments)   Reaction unknown      Medication List       Accurate as of 10/15/16 10:53 PM. Always use your most recent med list.          albuterol (2.5 MG/3ML) 0.083% nebulizer solution Commonly known as:   PROVENTIL Take 3 mLs (2.5 mg total) by nebulization every 2 (two) hours as needed for wheezing or shortness of breath.   amitriptyline 75 MG tablet Commonly known as:  ELAVIL TAKE 1 TABLETS AT BEDTIME   apixaban 5 MG Tabs tablet Commonly known as:  ELIQUIS Take 1 tablet (5 mg total) by mouth 2 (two) times daily.   calcium-vitamin D 500-200 MG-UNIT tablet Take 1 tablet by mouth every morning.   citalopram 20 MG tablet Commonly known as:  CELEXA TAKE 1 TABLET DAILY   donepezil 10 MG tablet Commonly known as:  ARICEPT TAKE ONE TABLET AT BEDTIME   furosemide 40 MG tablet Commonly known as:  LASIX TAKE (2) TABLETS DAILY   gabapentin 300 MG capsule Commonly known as:  NEURONTIN TAKE (1) CAPSULE TWICE DAILY.   hydrOXYzine 10 MG tablet Commonly known as:  ATARAX/VISTARIL TAKE (1) TABLET THREE TIMES DAILY.   ibuprofen 200 MG tablet Commonly known as:  ADVIL,MOTRIN Take 200 mg by mouth every 6 (six) hours as needed for moderate pain.   levocetirizine 5 MG tablet Commonly known as:  XYZAL TAKE ONE TABLET AT BEDTIME   mupirocin cream 2 % Commonly known as:  BACTROBAN Apply topically daily. Apply to right ankle daily then cover with foam dressing   omeprazole 20 MG capsule Commonly known as:  PRILOSEC TAKE  2 CAPSULES DAILY AS NEEDED FOR REFLUX   OXYGEN Inhale into the lungs at bedtime.   potassium chloride 10 MEQ tablet Commonly known as:  K-DUR TAKE 4 TABLETS ONCE DAILY   rOPINIRole 1 MG tablet Commonly known as:  REQUIP TAKE 1 OR 2 TABLETS AT BEDTIME   triamcinolone cream 0.1 % Commonly known as:  KENALOG Apply 1 application topically daily.   verapamil 180 MG 24 hr capsule Commonly known as:  VERELAN PM Take 180 mg by mouth at bedtime.   verapamil 180 MG CR tablet Commonly known as:  CALAN-SR TAKE ONE TABLET AT BEDTIME   VITAMIN K2-VITAMIN D3 PO Take 2 capsules by mouth every morning.        Follow-up: Return in about 3 months (around  01/13/2017).  Claretta Fraise, M.D.

## 2016-10-16 ENCOUNTER — Telehealth: Payer: Self-pay | Admitting: Family Medicine

## 2016-10-16 LAB — CBC WITH DIFFERENTIAL/PLATELET
BASOS ABS: 0 10*3/uL (ref 0.0–0.2)
BASOS: 0 %
EOS (ABSOLUTE): 0.2 10*3/uL (ref 0.0–0.4)
Eos: 3 %
HEMATOCRIT: 34.2 % (ref 34.0–46.6)
Hemoglobin: 11.2 g/dL (ref 11.1–15.9)
Immature Grans (Abs): 0 10*3/uL (ref 0.0–0.1)
Immature Granulocytes: 0 %
LYMPHS ABS: 1.3 10*3/uL (ref 0.7–3.1)
Lymphs: 19 %
MCH: 30.9 pg (ref 26.6–33.0)
MCHC: 32.7 g/dL (ref 31.5–35.7)
MCV: 94 fL (ref 79–97)
MONOCYTES: 12 %
Monocytes Absolute: 0.8 10*3/uL (ref 0.1–0.9)
NEUTROS ABS: 4.4 10*3/uL (ref 1.4–7.0)
Neutrophils: 66 %
Platelets: 233 10*3/uL (ref 150–379)
RBC: 3.63 x10E6/uL — ABNORMAL LOW (ref 3.77–5.28)
RDW: 13.9 % (ref 12.3–15.4)
WBC: 6.7 10*3/uL (ref 3.4–10.8)

## 2016-10-16 LAB — CMP14+EGFR
A/G RATIO: 1.3 (ref 1.2–2.2)
ALBUMIN: 3.6 g/dL (ref 3.5–4.7)
ALK PHOS: 107 IU/L (ref 39–117)
ALT: 12 IU/L (ref 0–32)
AST: 19 IU/L (ref 0–40)
BILIRUBIN TOTAL: 0.5 mg/dL (ref 0.0–1.2)
BUN/Creatinine Ratio: 12 (ref 12–28)
BUN: 10 mg/dL (ref 8–27)
CHLORIDE: 103 mmol/L (ref 96–106)
CO2: 26 mmol/L (ref 18–29)
Calcium: 8.9 mg/dL (ref 8.7–10.3)
Creatinine, Ser: 0.85 mg/dL (ref 0.57–1.00)
GFR calc non Af Amer: 61 mL/min/{1.73_m2} (ref >59)
GFR, EST AFRICAN AMERICAN: 70 mL/min/{1.73_m2} (ref >59)
GLOBULIN, TOTAL: 2.8 g/dL (ref 1.5–4.5)
Glucose: 100 mg/dL — ABNORMAL HIGH (ref 65–99)
Potassium: 4.4 mmol/L (ref 3.5–5.2)
SODIUM: 141 mmol/L (ref 134–144)
TOTAL PROTEIN: 6.4 g/dL (ref 6.0–8.5)

## 2016-10-16 LAB — VITAMIN B12: Vitamin B-12: 274 pg/mL (ref 232–1245)

## 2016-10-23 ENCOUNTER — Other Ambulatory Visit: Payer: Self-pay | Admitting: Family Medicine

## 2016-10-25 ENCOUNTER — Other Ambulatory Visit: Payer: Self-pay | Admitting: Pharmacist

## 2016-10-25 ENCOUNTER — Telehealth: Payer: Self-pay | Admitting: Pharmacist

## 2016-10-25 DIAGNOSIS — J449 Chronic obstructive pulmonary disease, unspecified: Secondary | ICD-10-CM | POA: Diagnosis not present

## 2016-10-25 MED ORDER — RIVAROXABAN 15 MG PO TABS: 15.0000 mg | ORAL_TABLET | Freq: Every day | ORAL | 0 refills | Status: DC

## 2016-10-25 NOTE — Telephone Encounter (Signed)
Patients is trying to meet beginnning of year deductible.  Currently Eliquis is over $100 and she cannot afford.  Once deductible met will be $45 per month.  Given #3 weeks of Xarelto 23m samples take 1 tablet qpm until we can either help her apply for LIS or until she can save to meet deductible.

## 2016-11-13 ENCOUNTER — Other Ambulatory Visit: Payer: Self-pay | Admitting: Licensed Clinical Social Worker

## 2016-11-13 NOTE — Patient Outreach (Signed)
Assessment:  CSW spoke via phone with client. CSW verified client identity. CSW and client spoke of client needs.  Client sees Dr. Livia Walker as primary care doctor. Client has prescribed medications and is taking medications as prescribed. Client does live alone. She said she had completed home health nursing services with [place]. Client has support from her daughter, [name] Walker. Client uses a walker to assist her in ambulation. Client has a lift chair she uses in her home as needed.  Client has [employer]. CSW and client spoke of client care plan. CSW encouraged client to communicate with CSW in next 30 days to discuss community resources of assistance for client. Client said that family members help in transporting client to and from client's scheduled medical appointments. Client uses oxygen as prescribed at night to help her with breathing needs of client. Client enjoys going to church. She enjoys playing the piano at church and at her home for relaxation. She wears a Life Alert bracelet to help with emergency needs of client. Client has ramp at her home to assist her in going in and out of her home. RN [name] Walker has discharged client from [facility]. Client has also said that one of her daughters passed away in 06/30/2016. Client said she is still trying to adjust to the death of her daughter. She said she is talking with family and friends about death of her daughter in 06-30-16.  She said she is now starting to slowly adjust to the death of her daughter last Fall. CSW encouraged client to call CSW at [phone] as needed to discuss social work needs of client. Client was appreciative of call from [place] on 11/13/16.   Plan:  Client to communicate with CSW in next 30 days to discuss community resources of assistance for client  CSW to call client in 4 weeks to assess needs of client.  [name] Walker.[name] Walker MSW, LCSW Licensed Clinical Social  Worker [facility] Care Management [phone]

## 2016-11-22 DIAGNOSIS — J449 Chronic obstructive pulmonary disease, unspecified: Secondary | ICD-10-CM | POA: Diagnosis not present

## 2016-11-26 DIAGNOSIS — H43811 Vitreous degeneration, right eye: Secondary | ICD-10-CM | POA: Diagnosis not present

## 2016-11-26 DIAGNOSIS — H35361 Drusen (degenerative) of macula, right eye: Secondary | ICD-10-CM | POA: Diagnosis not present

## 2016-11-26 DIAGNOSIS — H353211 Exudative age-related macular degeneration, right eye, with active choroidal neovascularization: Secondary | ICD-10-CM | POA: Diagnosis not present

## 2016-11-26 DIAGNOSIS — H353133 Nonexudative age-related macular degeneration, bilateral, advanced atrophic without subfoveal involvement: Secondary | ICD-10-CM | POA: Diagnosis not present

## 2016-11-27 ENCOUNTER — Other Ambulatory Visit: Payer: Self-pay | Admitting: Nurse Practitioner

## 2016-11-28 ENCOUNTER — Other Ambulatory Visit: Payer: Self-pay | Admitting: Family Medicine

## 2016-11-29 DIAGNOSIS — H353211 Exudative age-related macular degeneration, right eye, with active choroidal neovascularization: Secondary | ICD-10-CM | POA: Diagnosis not present

## 2016-12-03 ENCOUNTER — Encounter: Payer: Self-pay | Admitting: Physical Medicine & Rehabilitation

## 2016-12-03 ENCOUNTER — Ambulatory Visit (INDEPENDENT_AMBULATORY_CARE_PROVIDER_SITE_OTHER): Payer: Self-pay | Admitting: Otolaryngology

## 2016-12-11 ENCOUNTER — Other Ambulatory Visit: Payer: Self-pay | Admitting: Licensed Clinical Social Worker

## 2016-12-11 NOTE — Patient Outreach (Signed)
Assessment:  CSW spoke via phone with client.CSW verified client identity. Client sees Dr. Livia Snellen as primary doctor. Client has completed home health nursing support with [facility]. Client has support from her daughter, Renise Gillies. Client has prescribed medications and is taking mediatoins as precribed. Client uses walker to help with ambulation. CSW encouraged client to communicate with CSW in next 30 days to discuss community resources of assistance to client. Client has lift chair she uses as needed in the home. Client uses oxygen as prescribed at night to help her with breathing needs of client. Client said she has transport assistance as needed from family members. Client wears Life Alert bracelet to use as needed. Client wishes to remain at home with needed supports in place.  Client said she has appointment on 01/10/17 at [facility] in [place], [place].  Client wears reading glasses as needed. Client sometimes receives food support from her daughter, who lives nearby.  Client said she is sleeping well.  Client has adequate food supply. CSW and client spoke of [employer] [facility] [employer] [facility] in [place], [place]. [place] encouraged client to contact [employer] [facility] as needed for food support. CSW encouraged client to call CSW at [phone] as needed to discuss social work needs of client.   Plan:  Client to communicate with CSW in next 30 days to discuss community resources of assistance for client.  CSW to call client in 4 weeks to assess client needs.  [name].Kamal Jurgens MSW, LCSW Licensed Clinical Social Worker [facility] Care Management [phone]

## 2016-12-19 ENCOUNTER — Other Ambulatory Visit: Payer: Self-pay | Admitting: Pharmacist

## 2016-12-19 MED ORDER — APIXABAN 5 MG PO TABS: 5.0000 mg | ORAL_TABLET | Freq: Two times a day (BID) | ORAL | 0 refills | Status: DC

## 2016-12-23 DIAGNOSIS — J449 Chronic obstructive pulmonary disease, unspecified: Secondary | ICD-10-CM | POA: Diagnosis not present

## 2016-12-31 ENCOUNTER — Other Ambulatory Visit: Payer: Self-pay | Admitting: Family Medicine

## 2017-01-01 ENCOUNTER — Other Ambulatory Visit: Payer: Self-pay | Admitting: Family Medicine

## 2017-01-09 ENCOUNTER — Ambulatory Visit: Payer: Self-pay | Admitting: Physical Medicine & Rehabilitation

## 2017-01-11 ENCOUNTER — Other Ambulatory Visit: Payer: Self-pay | Admitting: Licensed Clinical Social Worker

## 2017-01-11 NOTE — Patient Outreach (Signed)
Assessment:  CSW spoke via phone with client.  CSW verified client identity. CSW and client spoke of client needs. Client sees Dr. Livia Snellen as primary care doctor. Client said she has a scheduled appointment with Dr. Livia Snellen on 02/04/17.  Client has completed home health nursing support with [facility].Client has support from her daughter, [name] Walker. Client uses a walker to assist client with ambulation. Client has prescribed medications and is taking medications as prescribed. CSW and client spoke of client care plan. CSW encouraged client to communicate with CSW in the next 30 days to discuss community resources of assistance to client. Client has lift chair she uses as needed in the home. She uses oxygen as prescribed at night to help her with breathing needs of client. Client has transport assistance as needed from her family members. She also wears a Life Alert bracelet to utilize for emergency help as needed.  Client said she does wish to remain at home, if possible, with needed supports in place. CSW again encouraged client to contact [facility] locally to seek food assistance for client.   Client said RN from [facility] visited client last week at home of client.  She said she is eating adequately.  She is sleeping well.  She was appreciative of call from [name] Walker.  CSW encouraged client or her daughter, [name] Walker, to call CSW at [phone] as needed to discuss social work needs of client.    Plan:  Client to communicate with CSW in next 30 days to discuss community resources of assistance to client.  CSW to call client in 4 weeks to assess client needs at that time.  [name] Walker.[name] Walker MSW, LCSW Licensed Clinical Social Worker [facility] Care Management [phone]

## 2017-01-14 ENCOUNTER — Encounter: Payer: Self-pay | Attending: Physical Medicine & Rehabilitation | Admitting: Physical Medicine & Rehabilitation

## 2017-01-15 ENCOUNTER — Other Ambulatory Visit: Payer: Self-pay | Admitting: Family Medicine

## 2017-01-22 DIAGNOSIS — J449 Chronic obstructive pulmonary disease, unspecified: Secondary | ICD-10-CM | POA: Diagnosis not present

## 2017-01-23 ENCOUNTER — Other Ambulatory Visit: Payer: Self-pay | Admitting: Family Medicine

## 2017-02-04 ENCOUNTER — Ambulatory Visit: Payer: Medicare Other | Admitting: Family Medicine

## 2017-02-04 ENCOUNTER — Other Ambulatory Visit: Payer: Self-pay | Admitting: Family Medicine

## 2017-02-06 ENCOUNTER — Ambulatory Visit: Payer: Medicare Other | Admitting: Family Medicine

## 2017-02-11 ENCOUNTER — Telehealth: Payer: Self-pay | Admitting: Family Medicine

## 2017-02-11 MED ORDER — APIXABAN 5 MG PO TABS: 5.0000 mg | ORAL_TABLET | Freq: Two times a day (BID) | ORAL | 2 refills | Status: DC

## 2017-02-11 NOTE — Telephone Encounter (Signed)
Rx sent for Eliquis 5mg  bid

## 2017-02-12 ENCOUNTER — Encounter: Payer: Self-pay | Admitting: Family Medicine

## 2017-02-12 ENCOUNTER — Ambulatory Visit (INDEPENDENT_AMBULATORY_CARE_PROVIDER_SITE_OTHER): Payer: Medicare Other | Admitting: Family Medicine

## 2017-02-12 ENCOUNTER — Other Ambulatory Visit: Payer: Self-pay | Admitting: Licensed Clinical Social Worker

## 2017-02-12 VITALS — BP 138/59 | HR 62 | Temp 97.7°F | Ht <= 58 in | Wt 178.0 lb

## 2017-02-12 DIAGNOSIS — I482 Chronic atrial fibrillation, unspecified: Secondary | ICD-10-CM

## 2017-02-12 DIAGNOSIS — Z7901 Long term (current) use of anticoagulants: Secondary | ICD-10-CM

## 2017-02-12 DIAGNOSIS — Z9981 Dependence on supplemental oxygen: Secondary | ICD-10-CM | POA: Diagnosis not present

## 2017-02-12 DIAGNOSIS — M159 Polyosteoarthritis, unspecified: Secondary | ICD-10-CM

## 2017-02-12 DIAGNOSIS — J449 Chronic obstructive pulmonary disease, unspecified: Secondary | ICD-10-CM | POA: Diagnosis not present

## 2017-02-12 DIAGNOSIS — M150 Primary generalized (osteo)arthritis: Secondary | ICD-10-CM | POA: Diagnosis not present

## 2017-02-12 DIAGNOSIS — R531 Weakness: Secondary | ICD-10-CM

## 2017-02-12 DIAGNOSIS — R0602 Shortness of breath: Secondary | ICD-10-CM

## 2017-02-12 NOTE — Patient Outreach (Signed)
Assessment:  CSW spoke via phone with client. CSW verified client identity. CSW and client spoke of client needs. Client sees Dr. Livia Snellen as primary care doctor. Client said she has a scheduled appointment with Dr. Livia Snellen later this afternoon. Client has prescribed medications and is taking medications as prescribed.  Client has completed home health nursing services. Client has support from her daughter, Nashaly Dorantes. Client uses a walker to assist client with ambulation. CSW and client spoke of client care plan. CSW encouraged client to communicate with CSW in next 30 days to discuss community resources of assistance for client. Client has a lift chair she uses in the home as needed. Client uses oxygen, as prescribed, at night to help client with breathing needs of client. Client receives transport assistance from family members to transport client to and from client's scheduled medical appointments.  Client wears a Scientist, research (medical) and utilizes Life Alert if needed for emergency assist for client. Client said she does wish to remain at home, if possible, with needed supports in place. Client is eating well and sleeping well. CSW has provided client with name, address, and phone number of [employer] [facility] (food pantry) for client to contact regarding food needs of client.   CSW spoke with client about community resources of assistance to client.  Client said she has a scheduled appointment on 03/05/17 at [facility]. Client was appreciative of support from [facility] program. She was appreciative of call from [place] on 02/12/17. CSW encouraged client to call CSW at [phone] as needed to discuss social work needs of client.   Plan:  Client to communicate with CSW in next 30 days to discuss community resources of assistance for client.   CSW to call client in 4 weeks to assess client needs at that time.  [name].Saxon Crosby MSW, LCSW Licensed Clinical Social Worker [facility] Care  Management [phone]

## 2017-02-12 NOTE — Progress Notes (Addendum)
Subjective:  Patient ID: [name] Walker, female    DOB: 1925/04/10  Age: 83 y.o. MRN: [id number]  CC: face to face (pt here today for face to face for mobility examination to try and get [facility])   HPI [name] presents for Face to face evaluation for mobility. She has been using a power mobility wheelchair for several years. Unfortunately if finally gave out. She is trying to get around with a walker now. However she's had a recent fall due to this ambulation. She can walk about 6-8 feet on her own with a cane. She can walk approximately 12-15 feet with a walker but she gets very tired and unstable. History is continued Assessment portion of this exam as well.   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   She has a past surgical history that includes Total hip arthroplasty; Knee Arthroplasty; Esophagogastroduodenoscopy (10/17/2007); Colonoscopy; Back surgery; Abdominal hysterectomy; Cholecystectomy; Cataracts; and Parotidectomy (Left, 05/17/2015).   Her family history includes Cancer in her sister; Rheum arthritis in her mother; Ulcers in her father and mother.She reports that she has never smoked. She has never used smokeless tobacco. She reports that she does not drink alcohol or use drugs.    ROS Review of Systems  Constitutional: Positive for activity change (she has been restricted due to loss of her power wheelchair. She is now able to walk just a few feet and has in fact fallen just trying to get to her bathroom). Negative for appetite change, fever and unexpected weight change.  HENT: Positive for hearing loss and tinnitus. Negative for  congestion, drooling, ear pain, postnasal drip, sore throat, trouble swallowing and voice change.   Respiratory: Positive for shortness of breath (with exertion). Negative for apnea, cough and choking.   Cardiovascular: Negative for chest pain.  Musculoskeletal: Positive for arthralgias, back pain, gait problem, joint swelling, myalgias, neck pain and neck stiffness.  Neurological: Positive for dizziness, speech difficulty (dysphasia/apraxia), weakness and light-headedness. Negative for tremors and seizures.    Objective:  BP (!) 138/59   Pulse 62   Temp 97.7 F (36.5 C) (Oral)   Ht 4\' 10"  (1.473 m)   Wt 178 lb (80.7 kg)   BMI 37.20 kg/m   BP Readings from Last 3 Encounters:  02/12/17 (!) 138/59  10/15/16 121/76  08/06/16 122/68    Wt Readings from Last 3 Encounters:  02/12/17 178 lb (80.7 kg)  10/15/16 170 lb (77.1 kg)  07/13/16 163 lb 2 oz (74 kg)     Physical Exam  Constitutional: She is oriented to person, place, and time. She appears well-developed and well-nourished. No distress.  Very frail   HENT:  Head: Normocephalic and atraumatic.  Right Ear: External ear normal.  Left Ear: External ear normal.  Nose: Nose normal.  Mouth/Throat: Oropharynx is clear and moist.  Eyes: Conjunctivae and EOM are normal. Pupils are equal, round, and reactive to light.  Neck: Normal range of motion. Neck supple. No thyromegaly present.  Cardiovascular: Normal rate and regular rhythm.   Murmur heard. Pulmonary/Chest: No respiratory distress. She has wheezes (decreased expiratory phase with diminished character). She has no rales.  Abdominal: Soft. Bowel sounds are normal. She exhibits no distension. There is no tenderness.  Musculoskeletal:  Right hip: She exhibits decreased range of motion, decreased strength, tenderness, bony tenderness and deformity.       Left hip: She exhibits decreased range of motion, decreased strength, tenderness, bony tenderness, crepitus and  deformity.       Right knee: She exhibits decreased range of motion, deformity, abnormal alignment and bony tenderness. Tenderness found. Medial joint line, lateral joint line and patellar tendon tenderness noted.       Left knee: She exhibits decreased range of motion, effusion, deformity, abnormal alignment and bony tenderness. Tenderness found. Medial joint line and lateral joint line tenderness noted.  Patient's gait is observed to be slow, unsteady and wobbly. It is antalgic and broad-based. She struggles to walk just a few feet with her walker and has to stop and rest and catch her breath every 8-10 feet. She demonstrates exhaustion within a very short distance of approximately 15 feet using her walker and with nursing assistance. She is wobbly for most of this distance and at risk of falling. Leg strength is 2/10 bilaterally for extension of the knees and hips. 1/5 for hip abduction bilaterally. Deep tendon reflexes are symmetric but minimal at the knees and 0 at the ankles. Pulses are intact for the dorsalis pedis bilaterally there is marked hypertrophy and meatus sig just above her arthritis diagnosis.  Lymphadenopathy:    She has no cervical adenopathy.  Neurological: She is alert and oriented to person, place, and time. She exhibits abnormal muscle tone. Coordination (her gait is very unsteady, uneven and poorly balanced) abnormal.  Skin: Skin is warm and dry.  Psychiatric: She has a normal mood and affect. Her behavior is normal.      Assessment & Pla n:   Rynlee was seen today for face to face.  Diagnoses and all orders for this visit:  Primary osteoarthritis involving multiple joints  Atrial fibrillation, chronic (HCC)  Supplemental oxygen dependent  Chronic anticoagulation  Chronic obstructive pulmonary disease, unspecified COPD type ([place])  SOB (shortness of breath)  Weakness       I am having Ms. Eckrich maintain her albuterol, triamcinolone cream,  calcium-vitamin D, Vitamin D-Vitamin K (VITAMIN K2-VITAMIN D3 PO), OXYGEN, ibuprofen, mupirocin cream, levocetirizine, verapamil, donepezil, potassium chloride, furosemide, omeprazole, gabapentin, citalopram, rOPINIRole, amitriptyline, hydrOXYzine, and apixaban.  Allergies as of 02/12/2017      Reactions   Diltiazem Anxiety, Other (See Comments)   Sleep disturbance   Iohexol     Desc: THROAT SWELLING-NOTED IN CHART AFTER IVC PLACEMENT-ARS 12-02-05   Latex Other (See Comments)   unknown   Penicillins Swelling   Patient was given zosyn this admission and tolerated. After d/w MD and pt, she had swelling in arm only, so possible just infiltration and not allergic. ABx changed to Cefazolin and is tolerating   Shellfish Allergy Other (See Comments)   Reaction unknown      Medication List       Accurate as of 02/12/17 11:59 PM. Always use your most recent med list.          albuterol (2.5 MG/3ML) 0.083% nebulizer solution Commonly known as:  PROVENTIL Take 3 mLs (2.5 mg total) by nebulization every 2 (two) hours as needed for wheezing or shortness of breath.   amitriptyline 75 MG tablet Commonly known as:  ELAVIL TAKE 1 TABLET AT BEDTIME   apixaban 5 MG Tabs tablet Commonly known as:  ELIQUIS Take 1 tablet (5 mg total) by mouth 2 (two) times daily.   calcium-vitamin D 500-200 MG-UNIT tablet  Take 1 tablet by mouth every morning.   citalopram 20 MG tablet Commonly known as:  CELEXA TAKE 1 TABLET DAILY   donepezil 10 MG tablet Commonly known as:  ARICEPT TAKE ONE TABLET AT BEDTIME   furosemide 40 MG tablet Commonly known as:  LASIX TAKE (2) TABLETS DAILY   gabapentin 300 MG capsule Commonly known as:  NEURONTIN TAKE (1) CAPSULE TWICE DAILY.   hydrOXYzine 10 MG tablet Commonly known as:  ATARAX/VISTARIL TAKE (1) TABLET THREE TIMES DAILY.   ibuprofen 200 MG tablet Commonly known as:  ADVIL,MOTRIN Take 200 mg by mouth every 6 (six) hours as needed for moderate pain.     levocetirizine 5 MG tablet Commonly known as:  XYZAL TAKE ONE TABLET AT BEDTIME   mupirocin cream 2 % Commonly known as:  BACTROBAN Apply topically daily. Apply to right ankle daily then cover with foam dressing   omeprazole 20 MG capsule Commonly known as:  PRILOSEC TAKE 2 CAPSULES DAILY AS NEEDED FOR REFLUX   OXYGEN Inhale into the lungs at bedtime.   potassium chloride 10 MEQ tablet Commonly known as:  K-DUR TAKE 4 TABLETS ONCE DAILY   rOPINIRole 1 MG tablet Commonly known as:  REQUIP TAKE 1 OR 2 TABLETS AT BEDTIME   triamcinolone cream 0.1 % Commonly known as:  KENALOG Apply 1 application topically daily.   verapamil 180 MG CR tablet Commonly known as:  CALAN-SR TAKE ONE TABLET AT BEDTIME   VITAMIN K2-VITAMIN D3 PO Take 2 capsules by mouth every morning.      Patient's primary diagnosis for her prior mobility device is osteoarthritis. This affects the legs arms shoulders and back. It leaves her quite weak. Strength is noted above is 2/5 in the legs and the arms. She is observed ambulating and having to stop every few feet indicating instability and risk of fall. She is able to transfer from seated to standing to the device with a walker to help her balance. The personal power medical device is essential for her to move from the bedroom to the bathroom in order to toilet and base as well as to move from bedroom to kitchen to prepare meals etc. She can use a walker for very short distance but has demonstrated that she is at high risk for falls due to poor balance. Her lack of balance and poor stability indicate that she could not operate a scooter adequately without risk of falling off. If she were to try to turn with the [place] she is at high risk for slipping off of the device. Therefore only a power wheelchair would be safe for this patient. Additionally there are some narrow hallways in her home that would be difficult for her to maneuver should she have a scooter. Patient  can safely operate her power wheelchair and has actually demonstrated that in the past. She is definitely motivated and willing to use the device as she desires to be as independent as possible.  Follow-up: Return in about 1 month (around 03/15/2017).  Claretta Fraise, M.D.  Addendum: For clarification it should be noted that the patient also has grip strength 2/5 in each hand. Therefore she cannot adequately propel a manual wheelchair   Claretta Fraise, MD 03/15/2017

## 2017-02-13 ENCOUNTER — Telehealth: Payer: Self-pay | Admitting: Family Medicine

## 2017-02-14 ENCOUNTER — Other Ambulatory Visit: Payer: Self-pay | Admitting: Family Medicine

## 2017-02-15 NOTE — Telephone Encounter (Signed)
Faxed forms back to [facility]

## 2017-02-22 DIAGNOSIS — J449 Chronic obstructive pulmonary disease, unspecified: Secondary | ICD-10-CM | POA: Diagnosis not present

## 2017-02-23 IMAGING — CT NM PET TUM IMG INITIAL (PI) SKULL BASE T - THIGH
7 series · 25 of 25 positions shown · non-contrast
Comparison: None.

CLINICAL DATA: Initial treatment strategy for squamous cell
carcinoma. Invasive squamous cell carcinoma with lymphovascular
invasion following LEFT parotid lesion resection.

EXAM:
NUCLEAR MEDICINE PET SKULL BASE TO THIGH
TECHNIQUE: 8.9 mCi F-18 FDG was injected intravenously. Full-ring PET imaging
was performed from the skull base to thigh after the radiotracer. CT
data was obtained and used for attenuation correction and anatomic
localization.
FASTING BLOOD GLUCOSE:  Value: 107 mg/dl

[Series 3: pet sk_thigh ac · axial · 5.0mm · 4.07mm/px · z∈[-1458,-622]mm · 6 of 210 slices shown]
[im 1/210]
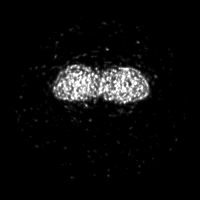
[im 42/210]
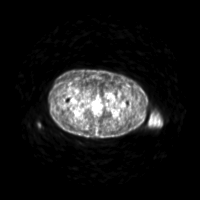
[im 84/210]
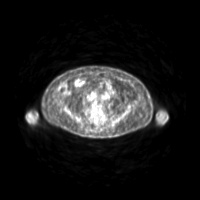
[im 126/210]
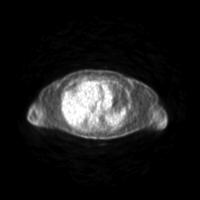
[im 168/210]
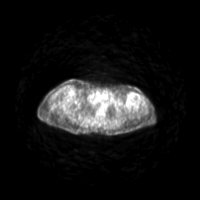
[im 210/210]
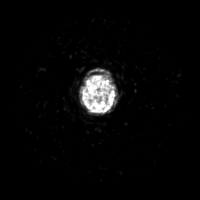

[Series 4: ct sk_thigh 5.0 b31f · axial · 5.0mm · 0.98mm/px · z∈[-1458,-622]mm · 5 of 210 slices shown]
[im 1/210]
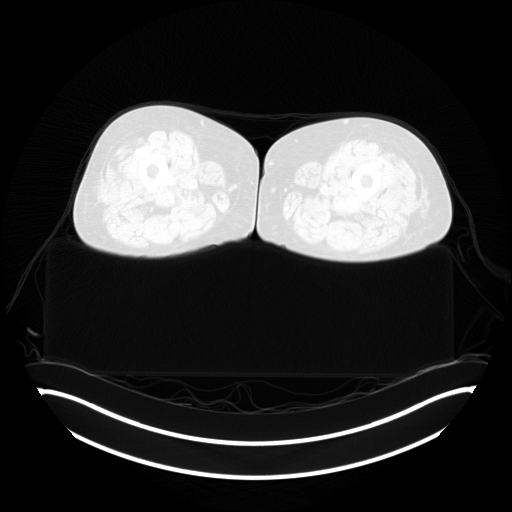
[im 53/210]
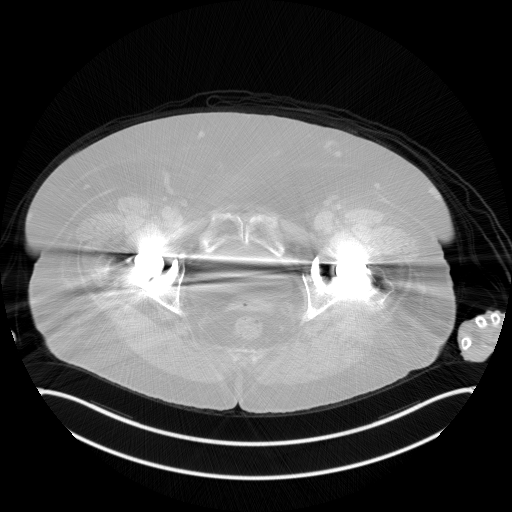
[im 105/210]
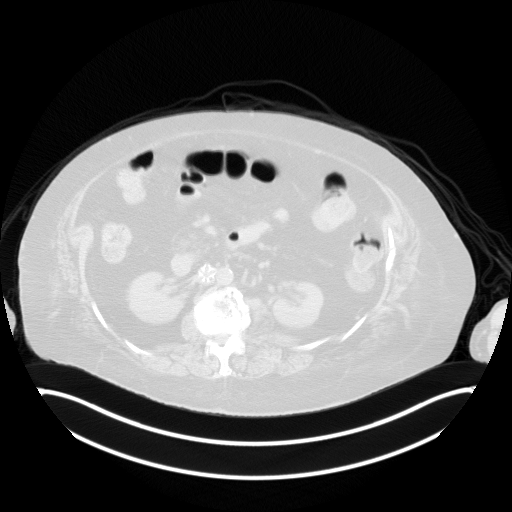
[im 157/210]
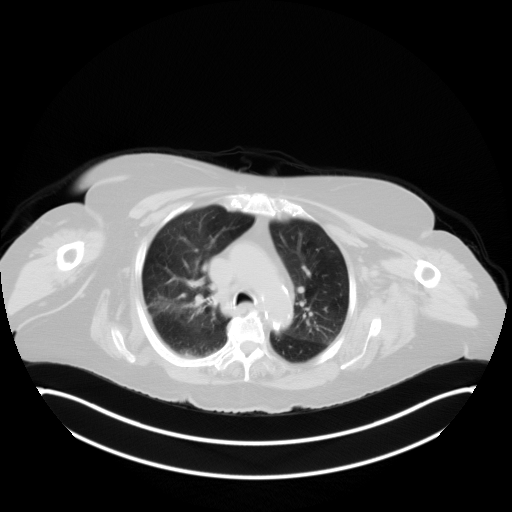
[im 210/210  brain]
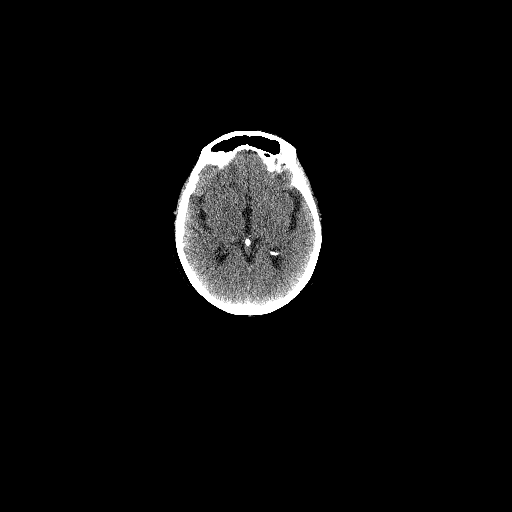

[Series 6: ct sk_thigh 5.0 b70f (id)_bone · axial · 5.0mm · 0.56mm/px · 1 of 50 slices shown]
[im 1/50  bone]
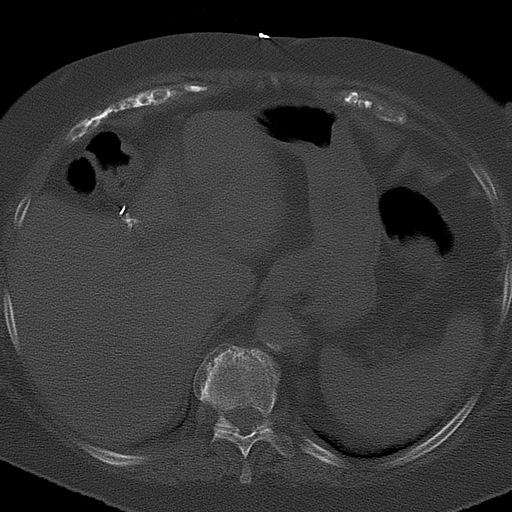

[Series 8: pet sk_thigh nac · axial · 5.0mm · 4.07mm/px · z∈[-1458,-622]mm · 5 of 210 slices shown]
[im 1/210]
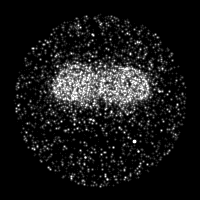
[im 53/210]
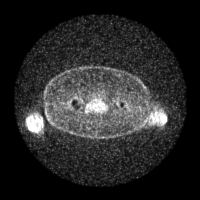
[im 105/210]
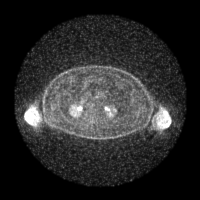
[im 157/210]
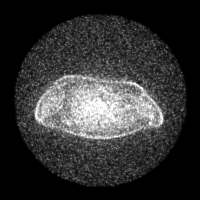
[im 210/210]
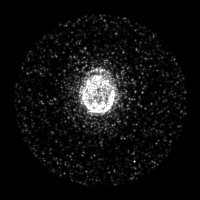

[Series 604: mip collection<mip range> · coronal · 1.74mm/px · 1 of 32 slices shown]
[im 1/32]
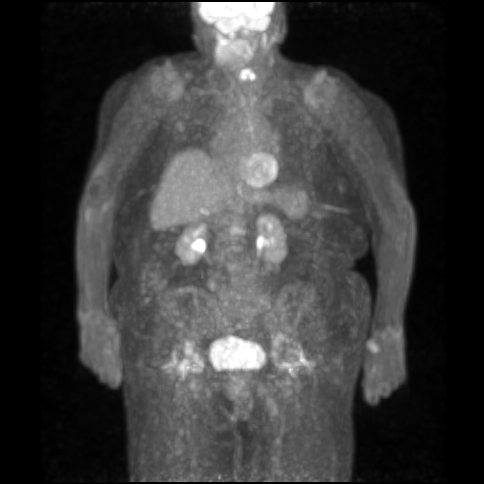

[Series 605: range-ct sk_thigh 5.0 (id)<alpha range> · 2 of 73 slices shown (1 of 2)]
[im 1/73]
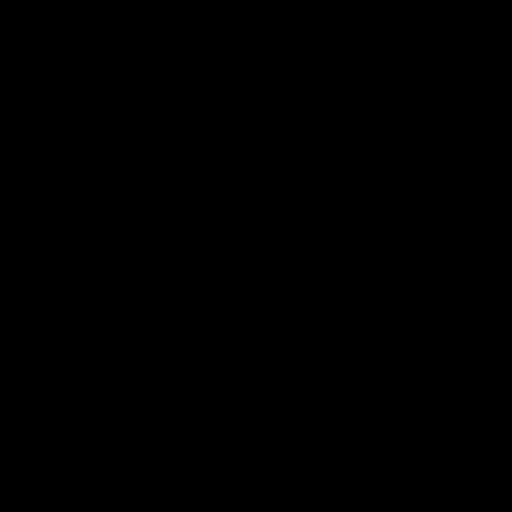
[im 73/73]
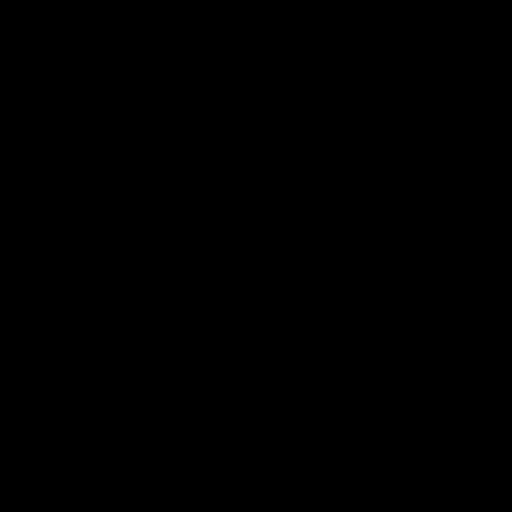

[Series 606: range-ct sk_thigh 5.0 (id)<alpha range> · 5 of 194 slices shown (2 of 2)]
[im 1/194]
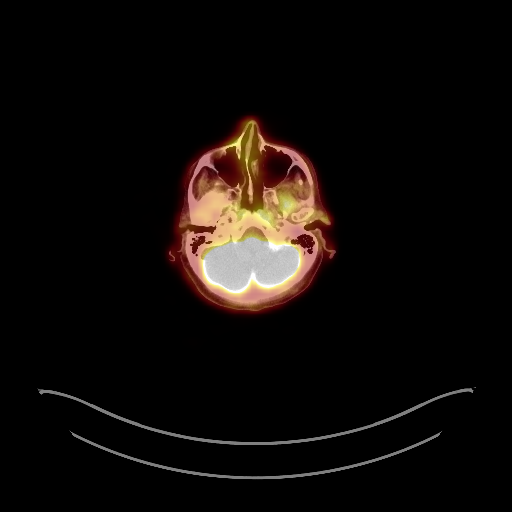
[im 49/194]
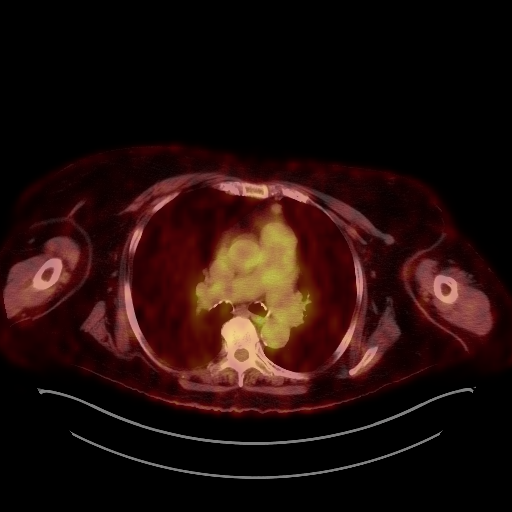
[im 97/194]
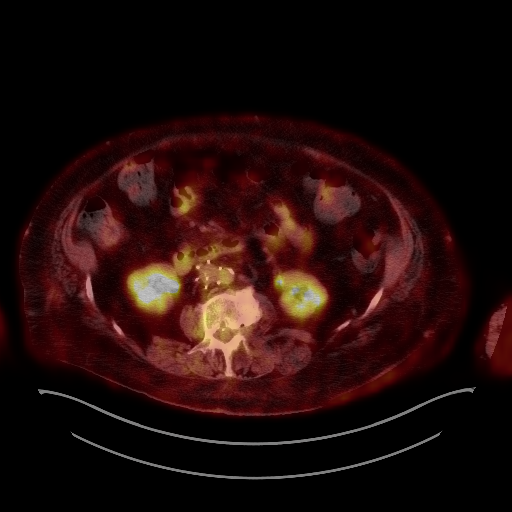
[im 145/194]
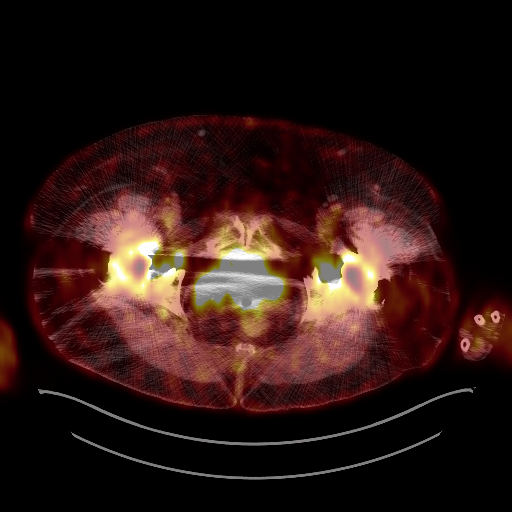
[im 194/194]
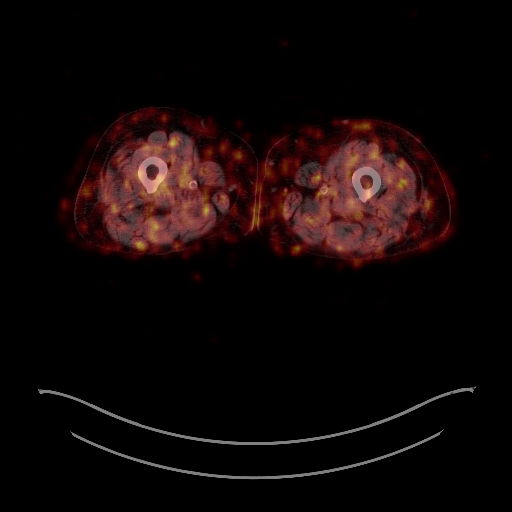

[25 of 25 positions shown; findings below may reference images not displayed]

FINDINGS: NECK

Mild metabolic activity at the resection site of the LEFT parotid
gland. No hypermetabolic LEFT level 2 lymph nodes. No hypermetabolic
cervical lymph nodes on LEFT or RIGHT.

There is diffuse metabolic activity within oral cavity. This felt to
be physiologic. Patient is edentulous. There is focal activity lung
the skin surface inferior to the nose along the RIGHT upper lip with
SUV max equal 15 (Image 17 the fused data set).

There is symmetric intense metabolic activity associated the vocal
cords which suggests patient was speaking during the FDG uptake
period.

CHEST

No hypermetabolic mediastinal or hilar nodes. Rounded prevascular
lymph node measures 9 mm on image 60, series 4 without associated
metabolic activity.

No suspicious pulmonary nodules on the CT scan.

ABDOMEN/PELVIS

No abnormal hypermetabolic activity within the liver, pancreas,
adrenal glands, or spleen. No hypermetabolic lymph nodes in the
abdomen or pelvis. IVC filter noted.

SKELETON

No focal hypermetabolic activity to suggest skeletal metastasis.
IMPRESSION: 1. No clear evidence of local residual disease at the LEFT parotid
resection site.
2. No hypermetabolic cervical lymph node in the LEFT or RIGHT.
3. Intense uptake within the skin surface inferior to the RIGHT
nostril along the RIGHT upper lip. Recommend clinical correlation.
4. Symmetric uptake within the vocal cords likely relates to
phonation during FDG equilibration period.
5. No evidence of distant metastatic disease.

## 2017-03-04 ENCOUNTER — Other Ambulatory Visit: Payer: Self-pay | Admitting: Family Medicine

## 2017-03-05 DIAGNOSIS — C07 Malignant neoplasm of parotid gland: Secondary | ICD-10-CM | POA: Diagnosis not present

## 2017-03-05 DIAGNOSIS — Z85818 Personal history of malignant neoplasm of other sites of lip, oral cavity, and pharynx: Secondary | ICD-10-CM | POA: Diagnosis not present

## 2017-03-07 ENCOUNTER — Encounter (HOSPITAL_COMMUNITY): Payer: Self-pay | Admitting: Emergency Medicine

## 2017-03-07 ENCOUNTER — Inpatient Hospital Stay (HOSPITAL_COMMUNITY)
Admission: EM | Admit: 2017-03-07 | Discharge: 2017-03-11 | DRG: 603 | Disposition: A | Discharge status: Home Health | Payer: Medicare Other | Attending: Internal Medicine | Admitting: Internal Medicine

## 2017-03-07 DIAGNOSIS — F329 Major depressive disorder, single episode, unspecified: Secondary | ICD-10-CM | POA: Diagnosis not present

## 2017-03-07 DIAGNOSIS — L02611 Cutaneous abscess of right foot: Secondary | ICD-10-CM | POA: Diagnosis not present

## 2017-03-07 DIAGNOSIS — H353123 Nonexudative age-related macular degeneration, left eye, advanced atrophic without subfoveal involvement: Secondary | ICD-10-CM | POA: Diagnosis not present

## 2017-03-07 DIAGNOSIS — Z66 Do not resuscitate: Secondary | ICD-10-CM | POA: Diagnosis present

## 2017-03-07 DIAGNOSIS — F0390 Unspecified dementia without behavioral disturbance: Secondary | ICD-10-CM | POA: Diagnosis not present

## 2017-03-07 DIAGNOSIS — K219 Gastro-esophageal reflux disease without esophagitis: Secondary | ICD-10-CM | POA: Diagnosis not present

## 2017-03-07 DIAGNOSIS — Z91013 Allergy to seafood: Secondary | ICD-10-CM

## 2017-03-07 DIAGNOSIS — Z8711 Personal history of peptic ulcer disease: Secondary | ICD-10-CM

## 2017-03-07 DIAGNOSIS — Z90710 Acquired absence of both cervix and uterus: Secondary | ICD-10-CM

## 2017-03-07 DIAGNOSIS — Z8489 Family history of other specified conditions: Secondary | ICD-10-CM | POA: Diagnosis not present

## 2017-03-07 DIAGNOSIS — L97519 Non-pressure chronic ulcer of other part of right foot with unspecified severity: Secondary | ICD-10-CM | POA: Diagnosis present

## 2017-03-07 DIAGNOSIS — H353211 Exudative age-related macular degeneration, right eye, with active choroidal neovascularization: Secondary | ICD-10-CM | POA: Diagnosis not present

## 2017-03-07 DIAGNOSIS — M79671 Pain in right foot: Secondary | ICD-10-CM | POA: Diagnosis not present

## 2017-03-07 DIAGNOSIS — N289 Disorder of kidney and ureter, unspecified: Secondary | ICD-10-CM | POA: Diagnosis not present

## 2017-03-07 DIAGNOSIS — Z85818 Personal history of malignant neoplasm of other sites of lip, oral cavity, and pharynx: Secondary | ICD-10-CM | POA: Diagnosis not present

## 2017-03-07 DIAGNOSIS — N183 Chronic kidney disease, stage 3 (moderate): Secondary | ICD-10-CM | POA: Diagnosis not present

## 2017-03-07 DIAGNOSIS — Z7901 Long term (current) use of anticoagulants: Secondary | ICD-10-CM

## 2017-03-07 DIAGNOSIS — Z91041 Radiographic dye allergy status: Secondary | ICD-10-CM | POA: Diagnosis not present

## 2017-03-07 DIAGNOSIS — L03115 Cellulitis of right lower limb: Secondary | ICD-10-CM | POA: Diagnosis not present

## 2017-03-07 DIAGNOSIS — Z880 Allergy status to penicillin: Secondary | ICD-10-CM | POA: Diagnosis not present

## 2017-03-07 DIAGNOSIS — J449 Chronic obstructive pulmonary disease, unspecified: Secondary | ICD-10-CM | POA: Diagnosis not present

## 2017-03-07 DIAGNOSIS — Z86718 Personal history of other venous thrombosis and embolism: Secondary | ICD-10-CM | POA: Diagnosis not present

## 2017-03-07 DIAGNOSIS — I10 Essential (primary) hypertension: Secondary | ICD-10-CM | POA: Diagnosis present

## 2017-03-07 DIAGNOSIS — I482 Chronic atrial fibrillation, unspecified: Secondary | ICD-10-CM | POA: Diagnosis present

## 2017-03-07 DIAGNOSIS — Z91040 Latex allergy status: Secondary | ICD-10-CM

## 2017-03-07 DIAGNOSIS — Z9981 Dependence on supplemental oxygen: Secondary | ICD-10-CM | POA: Diagnosis not present

## 2017-03-07 DIAGNOSIS — D649 Anemia, unspecified: Secondary | ICD-10-CM | POA: Diagnosis present

## 2017-03-07 DIAGNOSIS — Z888 Allergy status to other drugs, medicaments and biological substances status: Secondary | ICD-10-CM

## 2017-03-07 DIAGNOSIS — Z79899 Other long term (current) drug therapy: Secondary | ICD-10-CM

## 2017-03-07 DIAGNOSIS — Z8614 Personal history of Methicillin resistant Staphylococcus aureus infection: Secondary | ICD-10-CM | POA: Diagnosis not present

## 2017-03-07 DIAGNOSIS — Z23 Encounter for immunization: Secondary | ICD-10-CM | POA: Diagnosis not present

## 2017-03-07 DIAGNOSIS — E876 Hypokalemia: Secondary | ICD-10-CM | POA: Diagnosis present

## 2017-03-07 DIAGNOSIS — H353113 Nonexudative age-related macular degeneration, right eye, advanced atrophic without subfoveal involvement: Secondary | ICD-10-CM | POA: Diagnosis not present

## 2017-03-07 DIAGNOSIS — L0390 Cellulitis, unspecified: Secondary | ICD-10-CM

## 2017-03-07 DIAGNOSIS — I129 Hypertensive chronic kidney disease with stage 1 through stage 4 chronic kidney disease, or unspecified chronic kidney disease: Secondary | ICD-10-CM | POA: Diagnosis not present

## 2017-03-07 LAB — CBC WITH DIFFERENTIAL/PLATELET
BASOS ABS: 0 10*3/uL (ref 0.0–0.1)
Basophils Relative: 0 %
EOS ABS: 0.3 10*3/uL (ref 0.0–0.7)
EOS PCT: 2 %
HCT: 40 % (ref 36.0–46.0)
HEMOGLOBIN: 14.1 g/dL (ref 12.0–15.0)
LYMPHS PCT: 3 %
Lymphs Abs: 0.5 10*3/uL — ABNORMAL LOW (ref 0.7–4.0)
MCH: 32.5 pg (ref 26.0–34.0)
MCHC: 35.3 g/dL (ref 30.0–36.0)
MCV: 92.2 fL (ref 78.0–100.0)
Monocytes Absolute: 1.6 10*3/uL — ABNORMAL HIGH (ref 0.1–1.0)
Monocytes Relative: 9 %
NEUTROS PCT: 86 %
Neutro Abs: 15.7 10*3/uL — ABNORMAL HIGH (ref 1.7–7.7)
PLATELETS: 220 10*3/uL (ref 150–400)
RBC: 4.34 MIL/uL (ref 3.87–5.11)
RDW: 13.3 % (ref 11.5–15.5)
WBC: 18.1 10*3/uL — AB (ref 4.0–10.5)

## 2017-03-07 LAB — BASIC METABOLIC PANEL
ANION GAP: 10 (ref 5–15)
BUN: 19 mg/dL (ref 6–20)
CHLORIDE: 100 mmol/L — AB (ref 101–111)
CO2: 30 mmol/L (ref 22–32)
Calcium: 9.4 mg/dL (ref 8.9–10.3)
Creatinine, Ser: 1.35 mg/dL — ABNORMAL HIGH (ref 0.44–1.00)
GFR, EST AFRICAN AMERICAN: 39 mL/min — AB (ref >60)
GFR, EST NON AFRICAN AMERICAN: 34 mL/min — AB (ref >60)
Glucose, Bld: 100 mg/dL — ABNORMAL HIGH (ref 65–99)
Potassium: 3.5 mmol/L (ref 3.5–5.1)
SODIUM: 140 mmol/L (ref 135–145)

## 2017-03-07 MED ORDER — FENTANYL CITRATE (PF) 100 MCG/2ML IJ SOLN
100.0000 ug | Freq: Once | INTRAMUSCULAR | Status: AC
  Administered 2017-03-07: 100 ug via INTRAVENOUS
  Filled 2017-03-07: qty 2

## 2017-03-07 MED ORDER — TETANUS-DIPHTH-ACELL PERTUSSIS 5-2.5-18.5 LF-MCG/0.5 IM SUSP
0.5000 mL | Freq: Once | INTRAMUSCULAR | Status: AC
  Administered 2017-03-07: 0.5 mL via INTRAMUSCULAR
  Filled 2017-03-07: qty 0.5

## 2017-03-07 MED ORDER — CEFAZOLIN SODIUM-DEXTROSE 1-4 GM/50ML-% IV SOLN
1.0000 g | Freq: Once | INTRAVENOUS | Status: AC
  Administered 2017-03-07: 1 g via INTRAVENOUS
  Filled 2017-03-07: qty 50

## 2017-03-07 NOTE — ED Provider Notes (Signed)
[facility] DEPT Provider Note   CSN: [id number] Arrival date & time: 03/07/17  1712     History   Chief Complaint Chief Complaint  Patient presents with  . erythema on leg and foot    HPI [name] Walker is a 83 y.o. female.Complains of painful right leg and right foot onset gradually last night. She reports that redness and pain started at the foot and has spread proximally to the thigh since today. Pain is worse with weightbearing or pressing on the area and improved with remaining still. No vomiting no fever no shortness of breath no chest pain no other associated symptoms. She is uncertain if symptoms resemble DVT she's had in the past. She denies noncompliance with eliquis   HPI  Past Medical History:  Diagnosis Date  . Cataract   . COPD (chronic obstructive pulmonary disease) ([place])   . Depression   . DVT (deep venous thrombosis) ([place])   . Gastric erosion   . GERD (gastroesophageal reflux disease)   . Hypertension   . Insomnia   . Ischemic colitis ([place])   . Osteoarthritis   . Parotid mass 2016  . 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 Supplemental oxygen dependent 07/14/2016  . C5 vertebral fracture ([place]) 05/25/2016  . Hypokalemia 05/23/2016  . B12 deficiency 08/24/2015  . Insomnia 08/15/2015  . Primary squamous cell carcinoma of parotid gland ([place]) 07/25/2015  . Atrial fibrillation, chronic ([place]) 07/22/2015  . H/O parotidectomy 05/17/2015  . Osteopenia 05/13/2015  . Weakness 07/15/2013  . Anemia 01/06/2009  . DEPRESSION 01/06/2009  . Essential hypertension 01/06/2009  . COPD (chronic obstructive pulmonary disease) ([place]) 01/06/2009  . GERD 01/06/2009  . IRRITABLE BOWEL SYNDROME 01/06/2009    Past Surgical History:  Procedure Laterality Date  . ABDOMINAL HYSTERECTOMY    . BACK SURGERY    . Cataracts    . CHOLECYSTECTOMY    . COLONOSCOPY    .  ESOPHAGOGASTRODUODENOSCOPY  10/17/2007   erosion   . KNEE ARTHROPLASTY     bilateral  . PAROTIDECTOMY Left 05/17/2015   Procedure: PAROTIDECTOMY- LEFT TOTAL;  Surgeon: Leta Baptist, MD;  Location: [facility];  Service: ENT;  Laterality: Left;  . TOTAL HIP ARTHROPLASTY     bilateral    OB History    No data available       Home Medications    Prior to Admission medications   Medication Sig Start Date End Date Taking? Authorizing Provider  albuterol (PROVENTIL) (2.5 MG/3ML) 0.083% nebulizer solution Take 3 mLs (2.5 mg total) by nebulization every 2 (two) hours as needed for wheezing or shortness of breath. 07/31/15   Isaac Bliss, Rayford Halsted, MD  amitriptyline (ELAVIL) 75 MG tablet TAKE 1 TABLET AT BEDTIME 02/15/17   Claretta Fraise, MD  apixaban (ELIQUIS) 5 MG TABS tablet Take 1 tablet (5 mg total) by mouth 2 (two) times daily. 02/11/17   Cherre Robins, PharmD  Calcium Carbonate-Vitamin D (CALCIUM-VITAMIN D) 500-200 MG-UNIT tablet Take 1 tablet by mouth every morning.    [provider]  citalopram (CELEXA) 20 MG tablet TAKE 1 TABLET DAILY 02/15/17   Claretta Fraise, MD  donepezil (ARICEPT) 10 MG tablet TAKE ONE TABLET AT BEDTIME 01/01/17   Claretta Fraise, MD  furosemide (LASIX) 40 MG tablet TAKE (2) TABLETS DAILY 01/16/17   Claretta Fraise, MD  gabapentin (NEURONTIN) 300 MG capsule TAKE (1) CAPSULE TWICE DAILY. 02/15/17  Claretta Fraise, MD  hydrOXYzine (ATARAX/VISTARIL) 10 MG tablet TAKE (1) TABLET THREE TIMES DAILY. 03/04/17   Claretta Fraise, MD  ibuprofen (ADVIL,MOTRIN) 200 MG tablet Take 200 mg by mouth every 6 (six) hours as needed for moderate pain.    [provider]  levocetirizine (XYZAL) 5 MG tablet TAKE ONE TABLET AT BEDTIME 09/23/16   Claretta Fraise, MD  mupirocin cream (BACTROBAN) 2 % Apply topically daily. Apply to right ankle daily then cover with foam dressing 05/25/16   Donne Hazel, MD  omeprazole (PRILOSEC) 20 MG capsule TAKE 2 CAPSULES DAILY AS  NEEDED FOR REFLUX 02/15/17   Claretta Fraise, MD  OXYGEN Inhale into the lungs at bedtime.    [provider]  potassium chloride (K-DUR) 10 MEQ tablet TAKE 4 TABLETS ONCE DAILY 01/01/17   Claretta Fraise, MD  rOPINIRole (REQUIP) 1 MG tablet TAKE 1 OR 2 TABLETS AT BEDTIME 02/15/17   Claretta Fraise, MD  triamcinolone cream (KENALOG) 0.1 % Apply 1 application topically daily. 05/15/16   Claretta Fraise, MD  verapamil (CALAN-SR) 180 MG CR tablet TAKE ONE TABLET AT BEDTIME 11/27/16   Claretta Fraise, MD  Vitamin D-Vitamin K (VITAMIN K2-VITAMIN D3 PO) Take 2 capsules by mouth every morning.    [provider]    Family History Family History  Problem Relation Age of Onset  . Rheum arthritis Mother   . Ulcers Mother        on foot  . Ulcers Father   . Cancer Sister        uterine    Social History Social History  Substance Use Topics  . Smoking status: Never Smoker  . Smokeless tobacco: Never Used  . Alcohol use No     Allergies   Diltiazem; Iohexol; Latex; Penicillins; and Shellfish allergy   Review of Systems Review of Systems  Constitutional: Negative.   HENT: Negative.   Respiratory: Negative.   Cardiovascular: Negative.   Gastrointestinal: Negative.   Musculoskeletal: Positive for myalgias.       Pain at right leg, thigh and foot  Skin: Positive for color change.       Redness left leg and foot  Neurological: Negative.   Psychiatric/Behavioral: Negative.   All other systems reviewed and are negative.    Physical Exam Updated Vital Signs BP 136/71 (BP Location: Left Arm)   Pulse 90   Temp 98.2 F (36.8 C) (Oral)   Resp 20   SpO2 96%   Physical Exam  Constitutional: She appears well-developed and well-nourished.  HENT:  Head: Normocephalic and atraumatic.  Eyes: Conjunctivae are normal. Pupils are equal, round, and reactive to light.  Neck: Neck supple. No tracheal deviation present. No thyromegaly present.  Cardiovascular: Normal rate and regular  rhythm.   No murmur heard. Pulmonary/Chest: Effort normal and breath sounds normal.  Abdominal: Soft. Bowel sounds are normal. She exhibits no distension. There is no tenderness.  Musculoskeletal: Normal range of motion. She exhibits no edema or tenderness.  right lower extremity is reddened from proximal thigh medially over the entirety of the shin and calf. And dorsum of the foot. There is 2 cm bulla at the lateral aspect of the foot DP pulse 2+. All other extremity is without redness or tenderness neurovascular intact.  Neurological: She is alert. Coordination normal.  Skin: Skin is warm and dry. No rash noted. There is erythema.  Erythematous at left lower extremity as described above. Otherwise without rash  Psychiatric: She has a normal mood and affect.  Nursing note and vitals reviewed.    ED Treatments / Results  Labs (all labs ordered are listed, but only abnormal results are displayed) Labs Reviewed  CULTURE, BLOOD (ROUTINE X 2)  CULTURE, BLOOD (ROUTINE X 2)  CBC WITH DIFFERENTIAL/PLATELET  BASIC METABOLIC PANEL    EKG  EKG Interpretation None       Radiology No results found.  Procedures Procedures (including critical care time)  Medications Ordered in ED Medications  ceFAZolin (ANCEF) IVPB 1 g/50 mL premix (not administered)  Tdap (BOOSTRIX) injection 0.5 mL (not administered)    12:20 AM patient is resting comfortably after treatment with intravenous fentanyl and IV antibiotics. Dr. Alcario Drought was consulted and arranged for admission and further treatment Results for orders placed or performed during the hospital encounter of 03/07/17  CBC with Differential/Platelet  Result Value Ref Range   WBC 18.1 (H) 4.0 - 10.5 K/uL   RBC 4.34 3.87 - 5.11 MIL/uL   Hemoglobin 14.1 12.0 - 15.0 g/dL   HCT 40.0 36.0 - 46.0 %   MCV 92.2 78.0 - 100.0 fL   MCH 32.5 26.0 - 34.0 pg   MCHC 35.3 30.0 - 36.0 g/dL   RDW 13.3 11.5 - 15.5 %   Platelets 220 150 - 400 K/uL    Neutrophils Relative % 86 %   Neutro Abs 15.7 (H) 1.7 - 7.7 K/uL   Lymphocytes Relative 3 %   Lymphs Abs 0.5 (L) 0.7 - 4.0 K/uL   Monocytes Relative 9 %   Monocytes Absolute 1.6 (H) 0.1 - 1.0 K/uL   Eosinophils Relative 2 %   Eosinophils Absolute 0.3 0.0 - 0.7 K/uL   Basophils Relative 0 %   Basophils Absolute 0.0 0.0 - 0.1 K/uL  Basic metabolic panel  Result Value Ref Range   Sodium 140 135 - 145 mmol/L   Potassium 3.5 3.5 - 5.1 mmol/L   Chloride 100 (L) 101 - 111 mmol/L   CO2 30 22 - 32 mmol/L   Glucose, Bld 100 (H) 65 - 99 mg/dL   BUN 19 6 - 20 mg/dL   Creatinine, Ser 1.35 (H) 0.44 - 1.00 mg/dL   Calcium 9.4 8.9 - 10.3 mg/dL   GFR calc non Af Amer 34 (L) >60 mL/min   GFR calc Af Amer 39 (L) >60 mL/min   Anion gap 10 5 - 15   No results found. Initial Impression / Assessment and Plan / ED Course  I have reviewed the triage vital signs and the nursing notes.  Pertinent labs & imaging results that were available during my care of the patient were reviewed by me and considered in my medical decision making (see chart for details).     Cellulitis more likely than DVT clinically. Patient should have noninvasive study of leg in the morning to check for DVT.  BASIC metabolic panel consistent with renal insufficiency  Final Clinical Impressions(s) / ED Diagnoses  Diagnosis #1 cellulitis of the right lower extremity  #2 renal insufficiency  Final diagnoses:  None    New Prescriptions New Prescriptions   No medications on file     Orlie Dakin, MD 03/08/17 [phone]

## 2017-03-07 NOTE — ED Triage Notes (Signed)
Patient reports with erythema on right foot that going up lower right leg that patient noticed last night.  Patient states is painful. Patient has PMH DVT in legs.

## 2017-03-08 ENCOUNTER — Observation Stay (HOSPITAL_BASED_OUTPATIENT_CLINIC_OR_DEPARTMENT_OTHER): Payer: Medicare Other

## 2017-03-08 DIAGNOSIS — N183 Chronic kidney disease, stage 3 (moderate): Secondary | ICD-10-CM | POA: Diagnosis present

## 2017-03-08 DIAGNOSIS — Z91013 Allergy to seafood: Secondary | ICD-10-CM | POA: Diagnosis not present

## 2017-03-08 DIAGNOSIS — M79671 Pain in right foot: Secondary | ICD-10-CM | POA: Diagnosis present

## 2017-03-08 DIAGNOSIS — L97519 Non-pressure chronic ulcer of other part of right foot with unspecified severity: Secondary | ICD-10-CM | POA: Diagnosis present

## 2017-03-08 DIAGNOSIS — Z66 Do not resuscitate: Secondary | ICD-10-CM | POA: Diagnosis present

## 2017-03-08 DIAGNOSIS — E876 Hypokalemia: Secondary | ICD-10-CM | POA: Diagnosis present

## 2017-03-08 DIAGNOSIS — I129 Hypertensive chronic kidney disease with stage 1 through stage 4 chronic kidney disease, or unspecified chronic kidney disease: Secondary | ICD-10-CM | POA: Diagnosis present

## 2017-03-08 DIAGNOSIS — L03115 Cellulitis of right lower limb: Secondary | ICD-10-CM | POA: Diagnosis present

## 2017-03-08 DIAGNOSIS — I482 Chronic atrial fibrillation: Secondary | ICD-10-CM | POA: Diagnosis not present

## 2017-03-08 DIAGNOSIS — Z91040 Latex allergy status: Secondary | ICD-10-CM | POA: Diagnosis not present

## 2017-03-08 DIAGNOSIS — Z23 Encounter for immunization: Secondary | ICD-10-CM | POA: Diagnosis not present

## 2017-03-08 DIAGNOSIS — Z91041 Radiographic dye allergy status: Secondary | ICD-10-CM | POA: Diagnosis not present

## 2017-03-08 DIAGNOSIS — Z85818 Personal history of malignant neoplasm of other sites of lip, oral cavity, and pharynx: Secondary | ICD-10-CM | POA: Diagnosis not present

## 2017-03-08 DIAGNOSIS — K219 Gastro-esophageal reflux disease without esophagitis: Secondary | ICD-10-CM | POA: Diagnosis present

## 2017-03-08 DIAGNOSIS — M7989 Other specified soft tissue disorders: Secondary | ICD-10-CM | POA: Diagnosis not present

## 2017-03-08 DIAGNOSIS — I10 Essential (primary) hypertension: Secondary | ICD-10-CM | POA: Diagnosis not present

## 2017-03-08 DIAGNOSIS — F329 Major depressive disorder, single episode, unspecified: Secondary | ICD-10-CM | POA: Diagnosis present

## 2017-03-08 DIAGNOSIS — L0390 Cellulitis, unspecified: Secondary | ICD-10-CM

## 2017-03-08 DIAGNOSIS — J449 Chronic obstructive pulmonary disease, unspecified: Secondary | ICD-10-CM | POA: Diagnosis present

## 2017-03-08 DIAGNOSIS — Z86718 Personal history of other venous thrombosis and embolism: Secondary | ICD-10-CM | POA: Diagnosis not present

## 2017-03-08 DIAGNOSIS — Z8711 Personal history of peptic ulcer disease: Secondary | ICD-10-CM | POA: Diagnosis not present

## 2017-03-08 DIAGNOSIS — Z90710 Acquired absence of both cervix and uterus: Secondary | ICD-10-CM | POA: Diagnosis not present

## 2017-03-08 DIAGNOSIS — Z9981 Dependence on supplemental oxygen: Secondary | ICD-10-CM | POA: Diagnosis not present

## 2017-03-08 DIAGNOSIS — Z8614 Personal history of Methicillin resistant Staphylococcus aureus infection: Secondary | ICD-10-CM | POA: Diagnosis not present

## 2017-03-08 DIAGNOSIS — D649 Anemia, unspecified: Secondary | ICD-10-CM | POA: Diagnosis not present

## 2017-03-08 DIAGNOSIS — F0390 Unspecified dementia without behavioral disturbance: Secondary | ICD-10-CM | POA: Diagnosis present

## 2017-03-08 DIAGNOSIS — Z880 Allergy status to penicillin: Secondary | ICD-10-CM | POA: Diagnosis not present

## 2017-03-08 DIAGNOSIS — Z8489 Family history of other specified conditions: Secondary | ICD-10-CM | POA: Diagnosis not present

## 2017-03-08 LAB — BASIC METABOLIC PANEL
ANION GAP: 8 (ref 5–15)
BUN: 18 mg/dL (ref 6–20)
CALCIUM: 8.6 mg/dL — AB (ref 8.9–10.3)
CO2: 28 mmol/L (ref 22–32)
CREATININE: 1.15 mg/dL — AB (ref 0.44–1.00)
Chloride: 103 mmol/L (ref 101–111)
GFR, EST AFRICAN AMERICAN: 47 mL/min — AB (ref >60)
GFR, EST NON AFRICAN AMERICAN: 41 mL/min — AB (ref >60)
Glucose, Bld: 118 mg/dL — ABNORMAL HIGH (ref 65–99)
Potassium: 3.2 mmol/L — ABNORMAL LOW (ref 3.5–5.1)
SODIUM: 139 mmol/L (ref 135–145)

## 2017-03-08 LAB — CBC
HCT: 31.4 % — ABNORMAL LOW (ref 36.0–46.0)
HEMOGLOBIN: 10.9 g/dL — AB (ref 12.0–15.0)
MCH: 31.7 pg (ref 26.0–34.0)
MCHC: 34.7 g/dL (ref 30.0–36.0)
MCV: 91.3 fL (ref 78.0–100.0)
PLATELETS: 189 10*3/uL (ref 150–400)
RBC: 3.44 MIL/uL — AB (ref 3.87–5.11)
RDW: 13.4 % (ref 11.5–15.5)
WBC: 17.6 10*3/uL — AB (ref 4.0–10.5)

## 2017-03-08 LAB — MRSA PCR SCREENING: MRSA by PCR: NEGATIVE

## 2017-03-08 MED ORDER — GABAPENTIN 300 MG PO CAPS
300.0000 mg | ORAL_CAPSULE | Freq: Two times a day (BID) | ORAL | Status: DC
  Administered 2017-03-08 – 2017-03-11 (×8): 300 mg via ORAL
  Filled 2017-03-08 (×8): qty 1

## 2017-03-08 MED ORDER — DONEPEZIL HCL 5 MG PO TABS
10.0000 mg | ORAL_TABLET | Freq: Every day | ORAL | Status: DC
  Administered 2017-03-08 – 2017-03-10 (×4): 10 mg via ORAL
  Filled 2017-03-08 (×4): qty 2

## 2017-03-08 MED ORDER — APIXABAN 5 MG PO TABS
5.0000 mg | ORAL_TABLET | Freq: Two times a day (BID) | ORAL | Status: DC
  Administered 2017-03-08 – 2017-03-10 (×6): 5 mg via ORAL
  Filled 2017-03-08 (×6): qty 1

## 2017-03-08 MED ORDER — POTASSIUM CHLORIDE ER 10 MEQ PO TBCR
40.0000 meq | EXTENDED_RELEASE_TABLET | Freq: Every day | ORAL | Status: DC
  Administered 2017-03-08 – 2017-03-11 (×4): 40 meq via ORAL
  Filled 2017-03-08 (×8): qty 4

## 2017-03-08 MED ORDER — ALBUTEROL SULFATE (2.5 MG/3ML) 0.083% IN NEBU: 2.5000 mg | INHALATION_SOLUTION | RESPIRATORY_TRACT | Status: DC | PRN

## 2017-03-08 MED ORDER — VANCOMYCIN HCL IN DEXTROSE 750-5 MG/150ML-% IV SOLN
750.0000 mg | INTRAVENOUS | Status: DC
  Administered 2017-03-08 – 2017-03-10 (×3): 750 mg via INTRAVENOUS
  Filled 2017-03-08 (×3): qty 150

## 2017-03-08 MED ORDER — VERAPAMIL HCL ER 180 MG PO TBCR
180.0000 mg | EXTENDED_RELEASE_TABLET | Freq: Every day | ORAL | Status: DC
  Administered 2017-03-08 – 2017-03-10 (×2): 180 mg via ORAL
  Filled 2017-03-08 (×4): qty 1

## 2017-03-08 MED ORDER — HYDROCODONE-ACETAMINOPHEN 5-325 MG PO TABS
1.0000 | ORAL_TABLET | Freq: Four times a day (QID) | ORAL | Status: DC | PRN
  Administered 2017-03-08 – 2017-03-09 (×5): 1 via ORAL
  Filled 2017-03-08 (×6): qty 1

## 2017-03-08 MED ORDER — HYDROXYZINE HCL 10 MG PO TABS
10.0000 mg | ORAL_TABLET | Freq: Three times a day (TID) | ORAL | Status: DC
  Administered 2017-03-08 – 2017-03-11 (×10): 10 mg via ORAL
  Filled 2017-03-08 (×11): qty 1

## 2017-03-08 MED ORDER — CITALOPRAM HYDROBROMIDE 10 MG PO TABS
20.0000 mg | ORAL_TABLET | Freq: Every day | ORAL | Status: DC
  Administered 2017-03-08 – 2017-03-11 (×4): 20 mg via ORAL
  Filled 2017-03-08 (×4): qty 2

## 2017-03-08 MED ORDER — FUROSEMIDE 40 MG PO TABS
40.0000 mg | ORAL_TABLET | Freq: Two times a day (BID) | ORAL | Status: DC
  Administered 2017-03-08 – 2017-03-10 (×6): 40 mg via ORAL
  Filled 2017-03-08 (×6): qty 1

## 2017-03-08 MED ORDER — IBUPROFEN 200 MG PO TABS
200.0000 mg | ORAL_TABLET | Freq: Four times a day (QID) | ORAL | Status: DC | PRN
  Administered 2017-03-09 – 2017-03-10 (×4): 200 mg via ORAL
  Filled 2017-03-08 (×4): qty 1

## 2017-03-08 MED ORDER — CALCIUM CARBONATE-VITAMIN D 500-200 MG-UNIT PO TABS
1.0000 | ORAL_TABLET | Freq: Every morning | ORAL | Status: DC
  Administered 2017-03-08 – 2017-03-11 (×4): 1 via ORAL
  Filled 2017-03-08 (×4): qty 1

## 2017-03-08 MED ORDER — AMITRIPTYLINE HCL 25 MG PO TABS
75.0000 mg | ORAL_TABLET | Freq: Every day | ORAL | Status: DC
  Administered 2017-03-08 – 2017-03-10 (×4): 75 mg via ORAL
  Filled 2017-03-08 (×4): qty 3

## 2017-03-08 MED ORDER — ROPINIROLE HCL 1 MG PO TABS
2.0000 mg | ORAL_TABLET | Freq: Every day | ORAL | Status: DC
  Administered 2017-03-09 – 2017-03-10 (×2): 2 mg via ORAL
  Filled 2017-03-08 (×2): qty 2

## 2017-03-08 MED ORDER — POTASSIUM CHLORIDE CRYS ER 20 MEQ PO TBCR: 40.0000 meq | EXTENDED_RELEASE_TABLET | Freq: Once | ORAL | Status: DC

## 2017-03-08 NOTE — H&P (Signed)
History and Physical    [name] CHY:[id number] DOB: 05-14-1925 DOA: 03/07/2017  PCP: Claretta Fraise, MD  Patient coming from: Home  I have personally briefly reviewed patient's old medical records in [place]  Chief Complaint: Erythema of leg  HPI: [name] Walker is a 83 y.o. female with medical history significant of COPD, prior DVT on chronic eliquis, HTN.  Patient had cellulitis last Sept which rapidly failed outpatient therapy and developed into septic shock 24 hours later.  MRSA positive in nares, treated with zosyn / vanc.  Patient presents to the ED with c/o pain in R foot and leg.  This onset gradually last night.  Redness and pain started in foot and rapidly progressed proximally in leg today.  Pain worse with weightbearing or palpation of area, better remaining still.  States she hasnt missed a dose of eliquis.   ED Course: Put on ancef for cellulitis.   Review of Systems: As per HPI otherwise 10 point review of systems negative.   Past Medical History:  Diagnosis Date  . Cataract   . COPD (chronic obstructive pulmonary disease) ([place])   . Depression   . DVT (deep venous thrombosis) ([place])   . Gastric erosion   . GERD (gastroesophageal reflux disease)   . Hypertension   . Insomnia   . Ischemic colitis ([place])   . Osteoarthritis   . Parotid mass 2016  . 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 ABDOMINAL HYSTERECTOMY    . BACK SURGERY    . Cataracts    . CHOLECYSTECTOMY    . COLONOSCOPY    . ESOPHAGOGASTRODUODENOSCOPY  10/17/2007   erosion   . KNEE ARTHROPLASTY     bilateral  . PAROTIDECTOMY Left 05/17/2015   Procedure: PAROTIDECTOMY- LEFT TOTAL;  Surgeon: Leta Baptist, MD;  Location: [facility];  Service: ENT;  Laterality: Left;  . TOTAL HIP ARTHROPLASTY     bilateral     reports that she has never smoked. She has never used smokeless tobacco.  She reports that she does not drink alcohol or use drugs.  Allergies  Allergen Reactions  . Diltiazem Anxiety and Other (See Comments)    Sleep disturbance  . Iohexol      Desc: THROAT SWELLING-NOTED IN CHART AFTER IVC PLACEMENT-ARS 12-02-05   . Latex Other (See Comments)    unknown  . Penicillins Swelling    Patient was given zosyn this admission and tolerated. After d/w MD and pt, she had swelling in arm only, so possible just infiltration and not allergic. ABx changed to Cefazolin and is tolerating  . Shellfish Allergy Other (See Comments)    Reaction unknown    Family History  Problem Relation Age of Onset  . Rheum arthritis Mother   . Ulcers Mother        on foot  . Ulcers Father   . Cancer Sister        uterine     Prior to Admission medications   Medication Sig Start Date End Date Taking? Authorizing Provider  amitriptyline (ELAVIL) 75 MG tablet TAKE 1 TABLET AT BEDTIME 02/15/17  Yes Stacks, Cletus Gash, MD  apixaban (ELIQUIS) 5 MG TABS tablet Take 1 tablet (5 mg total) by mouth 2 (two) times daily. 02/11/17  Yes Cherre Robins, PharmD  citalopram (CELEXA) 20 MG tablet TAKE 1 TABLET DAILY Patient taking differently: TAKE 1 TABLET PO DAILY 02/15/17  Yes Claretta Fraise, MD  donepezil (ARICEPT) 10 MG tablet TAKE ONE TABLET AT BEDTIME 01/01/17  Yes Stacks, Cletus Gash, MD  furosemide (LASIX) 40 MG tablet TAKE (2) TABLETS DAILY Patient taking differently: TAKE 1 TABLET PO BID 01/16/17  Yes Stacks, Cletus Gash, MD  gabapentin (NEURONTIN) 300 MG capsule TAKE (1) CAPSULE TWICE DAILY. 02/15/17  Yes Claretta Fraise, MD  hydrOXYzine (ATARAX/VISTARIL) 10 MG tablet TAKE (1) TABLET THREE TIMES DAILY. 03/04/17  Yes Claretta Fraise, MD  OXYGEN Inhale into the lungs at bedtime.   Yes [provider]  potassium chloride (K-DUR) 10 MEQ tablet TAKE 4 TABLETS ONCE DAILY 01/01/17  Yes Stacks, Cletus Gash, MD  rOPINIRole (REQUIP) 1 MG tablet TAKE 1 OR 2 TABLETS AT BEDTIME Patient taking differently: TAKE 2  TABLETS PO AT BEDTIME 02/15/17  Yes Stacks, Cletus Gash, MD  verapamil (CALAN-SR) 180 MG CR tablet TAKE ONE TABLET AT BEDTIME 11/27/16  Yes Claretta Fraise, MD  albuterol (PROVENTIL) (2.5 MG/3ML) 0.083% nebulizer solution Take 3 mLs (2.5 mg total) by nebulization every 2 (two) hours as needed for wheezing or shortness of breath. 07/31/15   Isaac Bliss, Rayford Halsted, MD  Calcium Carbonate-Vitamin D (CALCIUM-VITAMIN D) 500-200 MG-UNIT tablet Take 1 tablet by mouth every morning.    [provider]  ibuprofen (ADVIL,MOTRIN) 200 MG tablet Take 200 mg by mouth every 6 (six) hours as needed for moderate pain.    [provider]  omeprazole (PRILOSEC) 20 MG capsule TAKE 2 CAPSULES DAILY AS NEEDED FOR REFLUX Patient taking differently: TAKE 2 CAPSULES PO DAILY AS NEEDED FOR REFLUX 02/15/17   Claretta Fraise, MD  Vitamin D-Vitamin K (VITAMIN K2-VITAMIN D3 PO) Take 2 capsules by mouth every morning.    [provider]    Physical Exam: Vitals:   03/07/17 1718 03/07/17 1949 03/07/17 2012 03/07/17 2231  BP: (!) 152/79 136/71  (!) 140/59  Pulse: 80 90  90  Resp: 18 20  20   Temp: 98.2 F (36.8 C)   98.3 F (36.8 C)  TempSrc: Oral   Oral  SpO2: 98% 97% 96% 99%    Constitutional: NAD, calm, comfortable Eyes: PERRL, lids and conjunctivae normal ENMT: Mucous membranes are moist. Posterior pharynx clear of any exudate or lesions.Normal dentition.  Neck: normal, supple, no masses, no thyromegaly Respiratory: clear to auscultation bilaterally, no wheezing, no crackles. Normal respiratory effort. No accessory muscle use.  Cardiovascular: Regular rate and rhythm, no murmurs / rubs / gallops. No extremity edema. 2+ pedal pulses. No carotid bruits.  Abdomen: no tenderness, no masses palpated. No hepatosplenomegaly. Bowel sounds positive.  Musculoskeletal: no clubbing / cyanosis. No joint deformity upper and lower extremities. Good ROM, no contractures. Normal muscle tone.  Skin: Erythema of R  foot and leg up to thigh.  There is wound to lateral aspect of R foot with bullae that is likely the nidus for infection. Neurologic: CN 2-12 grossly intact. Sensation intact, DTR normal. Strength 5/5 in all 4.  Psychiatric: Normal judgment and insight. Alert and oriented x 3. Normal mood.    Labs on Admission: I have personally reviewed following labs and imaging studies  CBC:  Recent Labs Lab 03/07/17 2041  WBC 18.1*  NEUTROABS 15.7*  HGB 14.1  HCT 40.0  MCV 92.2  PLT 409   Basic Metabolic Panel:  Recent Labs Lab 03/07/17 2041  NA 140  K 3.5  CL 100*  CO2 30  GLUCOSE 100*  BUN 19  CREATININE 1.35*  CALCIUM 9.4   GFR: CrCl cannot be  calculated (Unknown ideal weight.). Liver Function Tests: No results for input(s): AST, ALT, ALKPHOS, BILITOT, PROT, ALBUMIN in the last 168 hours. No results for input(s): LIPASE, AMYLASE in the last 168 hours. No results for input(s): AMMONIA in the last 168 hours. Coagulation Profile: No results for input(s): INR, PROTIME in the last 168 hours. Cardiac Enzymes: No results for input(s): CKTOTAL, CKMB, CKMBINDEX, TROPONINI in the last 168 hours. BNP (last 3 results) No results for input(s): PROBNP in the last 8760 hours. HbA1C: No results for input(s): HGBA1C in the last 72 hours. CBG: No results for input(s): GLUCAP in the last 168 hours. Lipid Profile: No results for input(s): CHOL, HDL, LDLCALC, TRIG, CHOLHDL, LDLDIRECT in the last 72 hours. Thyroid Function Tests: No results for input(s): TSH, T4TOTAL, FREET4, T3FREE, THYROIDAB in the last 72 hours. Anemia Panel: No results for input(s): VITAMINB12, FOLATE, FERRITIN, TIBC, IRON, RETICCTPCT in the last 72 hours. Urine analysis:    Component Value Date/Time   COLORURINE YELLOW 05/23/2016 0140   APPEARANCEUR CLEAR 05/23/2016 0140   LABSPEC 1.015 05/23/2016 0140   PHURINE 5.5 05/23/2016 0140   GLUCOSEU NEGATIVE 05/23/2016 0140   HGBUR TRACE (A) 05/23/2016 0140    BILIRUBINUR NEGATIVE 05/23/2016 0140   KETONESUR NEGATIVE 05/23/2016 0140   PROTEINUR TRACE (A) 05/23/2016 0140   UROBILINOGEN 0.2 01/19/2013 0040   NITRITE NEGATIVE 05/23/2016 0140   LEUKOCYTESUR NEGATIVE 05/23/2016 0140    Radiological Exams on Admission: No results found.  EKG: Independently reviewed.  Assessment/Plan Principal Problem:   Cellulitis of right leg Active Problems:   Essential hypertension   Atrial fibrillation, chronic (HCC)    1. Cellulitis of R leg - appears to be associated with a wound / ulcer of R foot that is likely the nidus for infection 1. Given h/o MRSA+ in nares, and poor initial response to ABx last Sept (wound up in septic shock 24 hours later) with cellulitis, feel that it is reasonable at this point to empirically treat with vancomycin initially. 2. If patient worsens add zosyn 3. BCx x2 4. Repeat CBC in AM to trend WBC (18k initially today). 5. Will rule out DVT with [place], but feel that this is less likely given presence of wound, patient on eliquis, etc. 6. Will have wound care look at foot wound 2. Chronic A.Fib - continue Eliquis 3. HTN - continue home meds  DVT prophylaxis: Eliquis Code Status: DNR Family Communication: No family in room Disposition Plan: Home after admit Consults called: None Admission status: Place in obs   Juwon Scripter, [facility] Pager [phone]  If 7AM-7PM, please contact day team taking care of patient [email or web address] Password [facility]  03/08/2017, 12:26 AM

## 2017-03-08 NOTE — Progress Notes (Signed)
Pharmacy Antibiotic Note  [name] Walker is a 83 y.o. female with erythema on leg and foot admitted on 03/07/2017 with foot wound.  Pharmacy has been consulted for vancomycin dosing.  Plan: Vancomycin 750 mg IV q24h VT=15-20 mg/L F/u scr/cultures/levels as needed     Temp (24hrs), Avg:98.3 F (36.8 C), Min:98.2 F (36.8 C), Max:98.3 F (36.8 C)   Recent Labs Lab 03/07/17 2041  WBC 18.1*  CREATININE 1.35*    CrCl cannot be calculated (Unknown ideal weight.).    Allergies  Allergen Reactions  . Diltiazem Anxiety and Other (See Comments)    Sleep disturbance  . Iohexol      Desc: THROAT SWELLING-NOTED IN CHART AFTER IVC PLACEMENT-ARS 12-02-05   . Latex Other (See Comments)    unknown  . Penicillins Swelling    Patient was given zosyn this admission and tolerated. After d/w MD and pt, she had swelling in arm only, so possible just infiltration and not allergic. ABx changed to Cefazolin and is tolerating  . Shellfish Allergy Other (See Comments)    Reaction unknown    Antimicrobials this admission: 6/14 ancef >> x1 ED 6/15 vancomycin >>   Dose adjustments this admission:   Microbiology results:  BCx:   UCx:    Sputum:    MRSA PCR:   Thank you for allowing pharmacy to be a part of this patient's care.  Dorrene German 03/08/2017 1:01 AM

## 2017-03-08 NOTE — Care Management Obs Status (Signed)
[facility] NOTIFICATION   Patient Details  Name: [name] Walker MRN: [id number] Date of Birth: [birth year]   Medicare Observation Status Notification Given:  Yes    Dessa Phi, RN 03/08/2017, 10:42 AM

## 2017-03-08 NOTE — Care Management Note (Signed)
Case Management Note  Patient Details  Name: [name] Walker MRN: [id number] Date of Birth: [birth year]  Subjective/Objective: 83 y/o f admitted w/R leg cellulitis.DNR. From home alone,has rw,life alert. Linda(dtr) primary contact lives close by-[phone]. PT cons-await recc. Has used [facility] in past.                  Action/Plan:d/c plan home    Expected Discharge Date:                  Expected Discharge Plan:  [place]  In-House Referral:  Clinical Social Work  Discharge planning Services  CM Consult  Post Acute Care Choice:    Choice offered to:  Patient  DME Arranged:    DME Agency:     HH Arranged:    [place] Agency:     Status of Service:  In process, will continue to follow  If discussed at Long Length of Stay Meetings, dates discussed:    Additional Comments:  Dessa Phi, RN 03/08/2017, 10:40 AM

## 2017-03-08 NOTE — Progress Notes (Signed)
  PROGRESS NOTE    [name] Walker  PRF:[id number] DOB: [birth year] DOA: 03/07/2017 PCP: Claretta Fraise, MD   Chief Complaint  Patient presents with  . erythema on leg and foot    Brief Narrative:  HPI on 03/08/2017 by Dr. Jennette Kettle [name] Walker is a 83 y.o. female with medical history significant of COPD, prior DVT on chronic eliquis, HTN. Patient had cellulitis last Sept which rapidly failed outpatient therapy and developed into septic shock 24 hours later. MRSA positive in nares, treated with zosyn / vanc. Patient presents to the ED with c/o pain in R foot and leg.  This onset gradually last night.  Redness and pain started in foot and rapidly progressed proximally in leg today.  Pain worse with weightbearing or palpation of area, better remaining still.  States she hasnt missed a dose of eliquis. Assessment & Plan   Patient admitted earlier today by Dr. Jennette Kettle. See H&P for details.   Right lower respiratory cellulitis -Also noted on right foot with blistering, suspect this is nidus of infection -Wound care consulted and appreciated -Continue IV vancomycin -Blood cultures pending -Lower extremity Doppler pending  History of DVT/Chronic Atrial fribrillation -Continue Eliquis -CHADVASC 4 -Currently rate controlled  Essential hypertension -Continue verapamil, lasix  Dementia -Continue Aricept  Depression -Continue Celexa  Hypokalemia -Replace and continue to monitor BMP  Chronic kidney disease, stage III -Creatinine appears to be stable, continue to monitor BMP  DVT Prophylaxis  Eliquis  Code Status: DNR  Family Communication: None at bedside  Disposition Plan: Observation. Continue IV vancomycin.   Consultants None  Procedures  None  Time Spent in minutes   30 minutes  Sigfredo Schreier D.O. on 03/08/2017 at 2:09 PM  Between 7am to 7pm - Pager - [phone]  After 7pm go to [email or web address] - password [email or web address]  And look for the night  coverage person covering for me after hours  [facility] Office  [phone]

## 2017-03-08 NOTE — Consult Note (Signed)
[facility] Nurse wound consult note Reason for Consult: right foot wound, intact bullae, cellulitis R leg Wound type: non pressure Pressure Injury POA: NA Measurement:4.8cn x 2.5cm intact blister Wound bed: view obstructed  Drainage (amount, consistency, odor) none Periwound: red, cellulitis from foot to upper thigh, marked Dressing procedure/placement/frequency: I have provided nurses with orders as follows: To wound (intact bullae) on right lateral foot cleanse with NS, gently pat dry, apply Xeroform, and cover with Allevyn dressing to absorb if blister ruptures, change daily and prn soiling. Pt states she has healed wound using fat back before but assures me she has not tried any home remedies on this particular wound. Pt has bilateral hemosiderin stains and poor pulses. Given her circulatory state and fact that bullae is infected near bone she may benefit from ruling out osteomyelitis and/or an ortho consult. Please order if you agree. In the meantime we will protect intact blister. We will not follow, but will remain available to this patient, to nursing, and the medical and/or surgical teams.  Please re-consult if we need to assist further.    Fara Olden, RN-C, WTA-C Wound Treatment Associate

## 2017-03-08 NOTE — Progress Notes (Signed)
*  Preliminary Results* Right lower extremity venous duplex completed. Right lower extremity is negative for deep vein thrombosis. There is no evidence of right Baker's cyst.  03/08/2017 3:59 PM  Maudry Mayhew, BS, RVT, RDCS, RDMS

## 2017-03-08 NOTE — Progress Notes (Signed)
CSW consulted to assist with ALF placement. PT eval is required to determine level of care pt needs at d/c. CSW will see pt following PT recommendations.  Werner Lean LCSW [phone]

## 2017-03-09 ENCOUNTER — Inpatient Hospital Stay (HOSPITAL_COMMUNITY): Payer: Medicare Other

## 2017-03-09 DIAGNOSIS — I10 Essential (primary) hypertension: Secondary | ICD-10-CM

## 2017-03-09 DIAGNOSIS — N183 Chronic kidney disease, stage 3 (moderate): Secondary | ICD-10-CM

## 2017-03-09 DIAGNOSIS — D649 Anemia, unspecified: Secondary | ICD-10-CM

## 2017-03-09 DIAGNOSIS — I482 Chronic atrial fibrillation: Secondary | ICD-10-CM

## 2017-03-09 LAB — CBC
HCT: 32.8 % — ABNORMAL LOW (ref 36.0–46.0)
Hemoglobin: 10.7 g/dL — ABNORMAL LOW (ref 12.0–15.0)
MCH: 30.8 pg (ref 26.0–34.0)
MCHC: 32.6 g/dL (ref 30.0–36.0)
MCV: 94.5 fL (ref 78.0–100.0)
PLATELETS: 197 10*3/uL (ref 150–400)
RBC: 3.47 MIL/uL — AB (ref 3.87–5.11)
RDW: 13.7 % (ref 11.5–15.5)
WBC: 7.7 10*3/uL (ref 4.0–10.5)

## 2017-03-09 LAB — BASIC METABOLIC PANEL
Anion gap: 7 (ref 5–15)
BUN: 21 mg/dL — ABNORMAL HIGH (ref 6–20)
CHLORIDE: 103 mmol/L (ref 101–111)
CO2: 30 mmol/L (ref 22–32)
Calcium: 8.9 mg/dL (ref 8.9–10.3)
Creatinine, Ser: 1.43 mg/dL — ABNORMAL HIGH (ref 0.44–1.00)
GFR, EST AFRICAN AMERICAN: 36 mL/min — AB (ref >60)
GFR, EST NON AFRICAN AMERICAN: 31 mL/min — AB (ref >60)
Glucose, Bld: 89 mg/dL (ref 65–99)
POTASSIUM: 4.7 mmol/L (ref 3.5–5.1)
SODIUM: 140 mmol/L (ref 135–145)

## 2017-03-09 NOTE — Progress Notes (Signed)
PROGRESS NOTE    [name] Walker  EXB:[id number] DOB: 02/04/1925 DOA: 03/07/2017 PCP: Claretta Fraise, MD   Chief Complaint  Patient presents with  . erythema on leg and foot    Brief Narrative:  HPI on 03/08/2017 by Dr. Dewayne Shorter Cardwellis a 83 y.o.femalewith medical history significant of COPD, prior DVT on chronic eliquis, HTN. Patient had cellulitis last Sept which rapidly failed outpatient therapy and developed into septic shock 24 hours later. MRSA positive in nares, treated with zosyn / vanc. Patient presents to the ED with c/o pain in R foot and leg. This onset gradually last night. Redness and pain started in foot and rapidly progressed proximally in leg today. Pain worse with weightbearing or palpation of area, better remaining still. States she hasnt missed a dose of eliquis. Assessment & Plan   Right lower respiratory cellulitis -Also noted on right foot with blistering, suspect this is nidus of infection -Wound care consulted and appreciated -Continue IV vancomycin -Blood cultures Show no growth to date -Lower extremity Doppler negative for DVT of RLE -Will obtain right lower ext xray  History of DVT/Chronic Atrial fribrillation -Continue Eliquis -CHADVASC 4 -Currently rate controlled  Essential hypertension -Continue verapamil, lasix  Dementia -Continue Aricept  Depression -Continue Celexa  Hypokalemia -Resolved with replacement -Continue to monitor BMP  Chronic kidney disease, stage III -Creatinine appears to be stable, continue to monitor BMP -Baseline creatinine approximately 1.1-1.2, currently 1.43  Normocytic anemia -Hemoglobin currently 10.7, continue monitor CBC -Baseline hemoglobin approximately 9-10  DVT Prophylaxis  Eliquis  Code Status: DNR  Family Communication: None at bedside  Disposition Plan: Admitted. Home when stable  Consultants None  Procedures  Right lower extremity Doppler  Antibiotics     Anti-infectives    Start     Dose/Rate Route Frequency Ordered Stop   03/08/17 0200  vancomycin (VANCOCIN) IVPB 750 mg/150 ml premix     750 mg 150 mL/hr over 60 Minutes Intravenous Every 24 hours 03/08/17 0153     03/07/17 2045  ceFAZolin (ANCEF) IVPB 1 g/50 mL premix     1 g 100 mL/hr over 30 Minutes Intravenous  Once 03/07/17 2043 03/07/17 2207      Subjective:   [name] Walker seen and examined today.  Patient feeling better today. Still has some mild pain in her right lower extremity as well as some erythema. Denies chest pain, shortness breath, abdominal pain, nausea vomiting, diarrhea or constipation.  Objective:   Vitals:   03/08/17 0531 03/08/17 1509 03/08/17 2103 03/09/17 0625  BP: (!) 104/38 (!) 83/60 (!) 118/48 (!) 123/47  Pulse: 73 68 68 73  Resp: 18 18 18 16   Temp: 99.3 F (37.4 C) 97.3 F (36.3 C) 99.5 F (37.5 C) 98.1 F (36.7 C)  TempSrc: Axillary Axillary Oral Oral  SpO2: 93% 93% 95% 95%  Weight:      Height:        Intake/Output Summary (Last 24 hours) at 03/09/17 1445 Last data filed at 03/09/17 0600  Gross per 24 hour  Intake              630 ml  Output                0 ml  Net              630 ml   Filed Weights   03/08/17 0125  Weight: 78.5 kg (173 lb 1.6 oz)    Exam  General: Well developed, well nourished,  NAD, appears stated age  83: NCAT,  mucous membranes moist.   Cardiovascular: S1 S2 auscultated, RRR  Respiratory: Clear to auscultation bilaterally with equal chest rise  Abdomen: Soft, obese, nontender, nondistended, + bowel sounds  Extremities: warm dry without cyanosis clubbing or edema. Right lower extremity erythema, bullae noted on that aspect of the right foot, currently covered with dressing.  Neuro: AAOx3, nonfocal  Psych: Normal affect and demeanor with intact judgement and insight   Data Reviewed: I have personally reviewed following labs and imaging studies  CBC:  Recent Labs Lab 03/07/17 2041  03/08/17 0506 03/09/17 0913  WBC 18.1* 17.6* 7.7  NEUTROABS 15.7*  --   --   HGB 14.1 10.9* 10.7*  HCT 40.0 31.4* 32.8*  MCV 92.2 91.3 94.5  PLT 220 189 811   Basic Metabolic Panel:  Recent Labs Lab 03/07/17 2041 03/08/17 0506 03/09/17 0913  NA 140 139 140  K 3.5 3.2* 4.7  CL 100* 103 103  CO2 30 28 30   GLUCOSE 100* 118* 89  BUN 19 18 21*  CREATININE 1.35* 1.15* 1.43*  CALCIUM 9.4 8.6* 8.9   GFR: Estimated Creatinine Clearance: 23 mL/min (A) (by C-G formula based on SCr of 1.43 mg/dL (H)). Liver Function Tests: No results for input(s): AST, ALT, ALKPHOS, BILITOT, PROT, ALBUMIN in the last 168 hours. No results for input(s): LIPASE, AMYLASE in the last 168 hours. No results for input(s): AMMONIA in the last 168 hours. Coagulation Profile: No results for input(s): INR, PROTIME in the last 168 hours. Cardiac Enzymes: No results for input(s): CKTOTAL, CKMB, CKMBINDEX, TROPONINI in the last 168 hours. BNP (last 3 results) No results for input(s): PROBNP in the last 8760 hours. HbA1C: No results for input(s): HGBA1C in the last 72 hours. CBG: No results for input(s): GLUCAP in the last 168 hours. Lipid Profile: No results for input(s): CHOL, HDL, LDLCALC, TRIG, CHOLHDL, LDLDIRECT in the last 72 hours. Thyroid Function Tests: No results for input(s): TSH, T4TOTAL, FREET4, T3FREE, THYROIDAB in the last 72 hours. Anemia Panel: No results for input(s): VITAMINB12, FOLATE, FERRITIN, TIBC, IRON, RETICCTPCT in the last 72 hours. Urine analysis:    Component Value Date/Time   COLORURINE YELLOW 05/23/2016 0140   APPEARANCEUR CLEAR 05/23/2016 0140   LABSPEC 1.015 05/23/2016 0140   PHURINE 5.5 05/23/2016 0140   GLUCOSEU NEGATIVE 05/23/2016 0140   HGBUR TRACE (A) 05/23/2016 0140   BILIRUBINUR NEGATIVE 05/23/2016 0140   KETONESUR NEGATIVE 05/23/2016 0140   PROTEINUR TRACE (A) 05/23/2016 0140   UROBILINOGEN 0.2 01/19/2013 0040   NITRITE NEGATIVE 05/23/2016 0140    LEUKOCYTESUR NEGATIVE 05/23/2016 0140   Sepsis Labs: @LABRCNTIP (procalcitonin:4,lacticidven:4)  ) Recent Results (from the past 240 hour(s))  Blood culture (routine x 2)     Status: None (Preliminary result)   Collection Time: 03/07/17  9:00 PM  Result Value Ref Range Status   Specimen Description BLOOD BLOOD LEFT ARM  Final   Special Requests   Final    BOTTLES DRAWN AEROBIC AND ANAEROBIC Blood Culture adequate volume   Culture   Final    NO GROWTH 1 DAY Performed at [facility], [place] [address], [place], [place] [address]    Report Status PENDING  Incomplete  Blood culture (routine x 2)     Status: None (Preliminary result)   Collection Time: 03/07/17  9:18 PM  Result Value Ref Range Status   Specimen Description BLOOD LEFT ANTECUBITAL  Final   Special Requests   Final  BOTTLES DRAWN AEROBIC AND ANAEROBIC Blood Culture adequate volume   Culture   Final    NO GROWTH 1 DAY Performed at [facility], [place] [address], [place], [place] [address]    Report Status PENDING  Incomplete  MRSA PCR Screening     Status: None   Collection Time: 03/08/17  1:59 AM  Result Value Ref Range Status   MRSA by PCR NEGATIVE NEGATIVE Final    Comment:        The GeneXpert MRSA Assay (FDA approved for NASAL specimens only), is one component of a comprehensive MRSA colonization surveillance program. It is not intended to diagnose MRSA infection nor to guide or monitor treatment for MRSA infections.       Radiology Studies: No results found.   Scheduled Meds: . amitriptyline  75 mg Oral QHS  . apixaban  5 mg Oral BID  . calcium-vitamin D  1 tablet Oral q morning - 10a  . citalopram  20 mg Oral Daily  . donepezil  10 mg Oral QHS  . furosemide  40 mg Oral BID  . gabapentin  300 mg Oral BID  . hydrOXYzine  10 mg Oral TID  . potassium chloride  40 mEq Oral Daily  . rOPINIRole  2 mg Oral QHS  . verapamil  180 mg Oral QHS   Continuous Infusions: . vancomycin Stopped  (03/09/17 0248)     LOS: 1 day   Time Spent in minutes   30 minutes  Jahleah Mariscal D.O. on 03/09/2017 at 2:45 PM  Between 7am to 7pm - Pager - [phone]  After 7pm go to [email or web address] - password [email or web address]  And look for the night coverage person covering for me after hours  [facility] Office  [phone]

## 2017-03-09 NOTE — Evaluation (Signed)
Physical Therapy Evaluation Patient Details Name: [name] Walker MRN: [id number] DOB: May 17, 1925 Today's Date: 03/09/2017   History of Present Illness  Patient is an 83 yo female admitted 03/07/17 with swelling, reddness, and pain in RLE.  Patient with cellulitis.   PMH:  COPD, prior DVT on chronic eliquis, HTN, depression, OA, bilateral THA, bilateral TKA, back surgery  Clinical Impression  Patient presents with problems below.  Will benefit from acute PT to maximize functional independence prior to discharge.  Recommend HHPT at discharge for continued therapy.    Follow Up Recommendations Home health PT;Supervision - Intermittent    Equipment Recommendations  None recommended by PT    Recommendations for Other Services       Precautions / Restrictions Precautions Precautions: Fall Restrictions Weight Bearing Restrictions: No      Mobility  Bed Mobility               General bed mobility comments: Patient in chair.  Does not sleep in bed at home.  Transfers Overall transfer level: Modified independent Equipment used: Rolling walker (2 wheeled)             General transfer comment: Patient used correct hand position and technique.  Increased time.  Ambulation/Gait Ambulation/Gait assistance: Supervision Ambulation Distance (Feet): 96 Feet Assistive device: Rolling walker (2 wheeled) Gait Pattern/deviations: Step-through pattern;Decreased stance time - right;Decreased stride length;Decreased weight shift to right;Shuffle Gait velocity: decreased Gait velocity interpretation: Below normal speed for age/gender General Gait Details: Patient with slow, steady gait with RW.  Uses RW safely.  Stairs            Wheelchair Mobility    Modified Rankin (Stroke Patients Only)       Balance Overall balance assessment: Needs assistance         Standing balance support: No upper extremity supported Standing balance-Leahy Scale: Fair                                Pertinent Vitals/Pain Pain Assessment: 0-10 Pain Score: 7  Pain Location: Rt LE Pain Descriptors / Indicators: Sore;Tender;Shooting Pain Intervention(s): Limited activity within patient's tolerance;Monitored during session;Repositioned;RN gave pain meds during session    Media expects to be discharged to:: Private residence Living Arrangements: Alone Available Help at Discharge: Family;Available PRN/intermittently Type of Home: House Home Access: Ramped entrance     Home Layout: One level Home Equipment: [place] - 4 wheels;Walker - 2 wheels;Bedside commode;Shower seat;Cane - single point Additional Comments: Pt sleeps in the lift chair.     Prior Function Level of Independence: Independent with assistive device(s)         Comments: Patient uses RW for ambulation, independent with basic ADL's.  Granddaughter assists patient with paying bills.     Hand Dominance   Dominant Hand: Left    Extremity/Trunk Assessment   Upper Extremity Assessment Upper Extremity Assessment: Overall WFL for tasks assessed    Lower Extremity Assessment Lower Extremity Assessment: Generalized weakness;RLE deficits/detail RLE Deficits / Details: Edema, reddness, and pain Rt lower leg.       Communication   Communication: No difficulties (Somewhat difficult to understand without teeth in place.)  Cognition Arousal/Alertness: Awake/alert Behavior During Therapy: [facility] for tasks assessed/performed Overall Cognitive Status: Within Functional Limits for tasks assessed  General Comments: Very talkative, with lots of stories to tell.  Pleasant lady.      General Comments      Exercises     Assessment/Plan    PT Assessment Patient needs continued PT services  PT Problem List Decreased strength;Decreased activity tolerance;Decreased balance;Decreased mobility;Obesity;Pain;Decreased skin integrity        PT Treatment Interventions DME instruction;Gait training;Functional mobility training;Therapeutic activities;Therapeutic exercise;Patient/family education    PT Goals (Current goals can be found in the Care Plan section)  Acute Rehab PT Goals Patient Stated Goal: To return home PT Goal Formulation: With patient Time For Goal Achievement: 03/16/17 Potential to Achieve Goals: Good    Frequency Min 3X/week   Barriers to discharge Decreased caregiver support Patient lives alone.  Reports family nearby.    Co-evaluation               AM-PAC PT "6 Clicks" Daily Activity  Outcome Measure Difficulty turning over in bed (including adjusting bedclothes, sheets and blankets)?: None Difficulty moving from lying on back to sitting on the side of the bed? : A Lot Difficulty sitting down on and standing up from a chair with arms (e.g., wheelchair, bedside commode, etc,.)?: None Help needed moving to and from a bed to chair (including a wheelchair)?: A Little Help needed walking in hospital room?: A Little Help needed climbing 3-5 steps with a railing? : A Little 6 Click Score: 19    End of Session Equipment Utilized During Treatment: Gait belt Activity Tolerance: Patient limited by pain Patient left: in chair;with call bell/phone within reach Nurse Communication: Mobility status PT Visit Diagnosis: Unsteadiness on feet (R26.81);Other abnormalities of gait and mobility (R26.89);Muscle weakness (generalized) (M62.81);Pain Pain - Right/Left: Right Pain - part of body: Leg    Time: 5053-9767 PT Time Calculation (min) (ACUTE ONLY): 30 min   Charges:   PT Evaluation $PT Eval Moderate Complexity: 1 Procedure     PT G Codes:        Carita Pian. Sanjuana Kava, Ms [facility] Acute Rehab Services Pager [place] 03/09/2017, 8:24 PM

## 2017-03-10 DIAGNOSIS — E876 Hypokalemia: Secondary | ICD-10-CM

## 2017-03-10 DIAGNOSIS — Z86718 Personal history of other venous thrombosis and embolism: Secondary | ICD-10-CM

## 2017-03-10 LAB — BASIC METABOLIC PANEL
ANION GAP: 8 (ref 5–15)
BUN: 21 mg/dL — ABNORMAL HIGH (ref 6–20)
CHLORIDE: 99 mmol/L — AB (ref 101–111)
CO2: 30 mmol/L (ref 22–32)
Calcium: 8.5 mg/dL — ABNORMAL LOW (ref 8.9–10.3)
Creatinine, Ser: 1.47 mg/dL — ABNORMAL HIGH (ref 0.44–1.00)
GFR, EST AFRICAN AMERICAN: 35 mL/min — AB (ref >60)
GFR, EST NON AFRICAN AMERICAN: 30 mL/min — AB (ref >60)
Glucose, Bld: 114 mg/dL — ABNORMAL HIGH (ref 65–99)
POTASSIUM: 3.5 mmol/L (ref 3.5–5.1)
SODIUM: 137 mmol/L (ref 135–145)

## 2017-03-10 LAB — CBC
HCT: 31.1 % — ABNORMAL LOW (ref 36.0–46.0)
HEMOGLOBIN: 10.6 g/dL — AB (ref 12.0–15.0)
MCH: 31.7 pg (ref 26.0–34.0)
MCHC: 34.1 g/dL (ref 30.0–36.0)
MCV: 93.1 fL (ref 78.0–100.0)
PLATELETS: 215 10*3/uL (ref 150–400)
RBC: 3.34 MIL/uL — AB (ref 3.87–5.11)
RDW: 13.6 % (ref 11.5–15.5)
WBC: 6.7 10*3/uL (ref 4.0–10.5)

## 2017-03-10 MED ORDER — MORPHINE SULFATE (PF) 2 MG/ML IV SOLN: 2.0000 mg | INTRAVENOUS | Status: DC | PRN

## 2017-03-10 MED ORDER — HYDROCODONE-ACETAMINOPHEN 5-325 MG PO TABS
1.0000 | ORAL_TABLET | Freq: Four times a day (QID) | ORAL | Status: DC | PRN
Start: 2017-03-10 — End: 2017-03-11
  Administered 2017-03-10 – 2017-03-11 (×2): 1 via ORAL
  Filled 2017-03-10 (×2): qty 1

## 2017-03-10 MED ORDER — APIXABAN 2.5 MG PO TABS
2.5000 mg | ORAL_TABLET | Freq: Two times a day (BID) | ORAL | Status: DC
  Administered 2017-03-10 – 2017-03-11 (×2): 2.5 mg via ORAL
  Filled 2017-03-10 (×2): qty 1

## 2017-03-10 MED ORDER — HYDROCODONE-ACETAMINOPHEN 5-325 MG PO TABS: 2.0000 | ORAL_TABLET | Freq: Four times a day (QID) | ORAL | Status: DC | PRN

## 2017-03-10 MED ORDER — DEXTROSE 5 % IV SOLN
1.0000 g | INTRAVENOUS | Status: DC
  Administered 2017-03-10 – 2017-03-11 (×2): 1 g via INTRAVENOUS
  Filled 2017-03-10 (×2): qty 10

## 2017-03-10 NOTE — Progress Notes (Addendum)
PROGRESS NOTE    [name] Walker  GNF:[id number] DOB: 1925-08-17 DOA: 03/07/2017 PCP: Claretta Fraise, MD   Chief Complaint  Patient presents with  . erythema on leg and foot    Brief Narrative:  HPI on 03/08/2017 by Dr. Dewayne Shorter Cardwellis a 83 y.o.femalewith medical history significant of COPD, prior DVT on chronic eliquis, HTN. Patient had cellulitis last Sept which rapidly failed outpatient therapy and developed into septic shock 24 hours later. MRSA positive in nares, treated with zosyn / vanc. Patient presents to the ED with c/o pain in R foot and leg. This onset gradually last night. Redness and pain started in foot and rapidly progressed proximally in leg today. Pain worse with weightbearing or palpation of area, better remaining still. States she hasnt missed a dose of eliquis. Assessment & Plan   Right lower respiratory cellulitis -Also noted on right foot with blistering, suspect this is nidus of infection -leukocytosis resolved -Wound care consulted and appreciated -was placed on  IV vancomycin -patient with slight increase in creatinine, will discontinue IV vanc and place on ceftriaxone -Blood cultures Show no growth to date -Lower extremity Doppler negative for DVT of RLE -Right foot xray shows soft tissue swelling, no bony abnormality -will add on morphine for pain control  History of DVT/Chronic Atrial fribrillation -Continue Eliquis -CHADVASC 4 -Currently rate controlled  Essential hypertension -Continue verapamil, lasix  Dementia -Continue Aricept  Depression -Continue Celexa  Hypokalemia -Resolved with replacement -Continue to monitor BMP  Chronic kidney disease, stage III -Creatinine appears to be stable, continue to monitor BMP -Baseline creatinine approximately 1.1-1.2, currently 1.47 -GFR still in stage III  Physical deconditioning -PT consulted, recommended HH  Normocytic anemia -Hemoglobin currently 10.6,  continue monitor CBC -Baseline hemoglobin approximately 9-10  DVT Prophylaxis  Eliquis  Code Status: DNR  Family Communication: None at bedside  Disposition Plan: Admitted. Home when stable  Consultants None  Procedures  Right lower extremity Doppler  Antibiotics   Anti-infectives    Start     Dose/Rate Route Frequency Ordered Stop   03/10/17 1200  cefTRIAXone (ROCEPHIN) 1 g in dextrose 5 % 50 mL IVPB     1 g 100 mL/hr over 30 Minutes Intravenous Every 24 hours 03/10/17 1043     03/08/17 0200  vancomycin (VANCOCIN) IVPB 750 mg/150 ml premix  Status:  Discontinued     750 mg 150 mL/hr over 60 Minutes Intravenous Every 24 hours 03/08/17 0153 03/10/17 1030   03/07/17 2045  ceFAZolin (ANCEF) IVPB 1 g/50 mL premix     1 g 100 mL/hr over 30 Minutes Intravenous  Once 03/07/17 2043 03/07/17 2207      Subjective:   [name] Walker seen and examined today.  Patient complaining of pain in her right foot, feels worse than prior days. Denies chest pain, shortness of breath, abdominal pain, nausea, vomiting, diarrhea, constipation.   Objective:   Vitals:   03/09/17 0625 03/09/17 1508 03/09/17 2108 03/10/17 0543  BP: (!) 123/47 93/68 93/80  (!) 126/55  Pulse: 73 80 80 74  Resp: 16 16 16 16   Temp: 98.1 F (36.7 C) 98 F (36.7 C) 98.8 F (37.1 C) 97.6 F (36.4 C)  TempSrc: Oral Oral Oral Oral  SpO2: 95% 100% 97% 97%  Weight:      Height:        Intake/Output Summary (Last 24 hours) at 03/10/17 1121 Last data filed at 03/10/17 0700  Gross per 24 hour  Intake  1350 ml  Output              700 ml  Net              650 ml   Filed Weights   03/08/17 0125  Weight: 78.5 kg (173 lb 1.6 oz)   Exam  General: Well developed, well nourished, NAD, appears stated age  HEENT: NCAT, mucous membranes moist.   Cardiovascular: S1 S2 auscultated, RRR, no murmur appreciated  Respiratory: Clear to auscultation bilaterally with equal chest rise  Abdomen: Soft, nontender,  nondistended, + bowel sounds  Extremities: warm dry without cyanosis clubbing or edema. RLE erythema, dressing in place on lateral aspect of right foot. Erythema not improving.  Neuro: AAOx3, nonfocal  Psych: Normal affect and demeanor with intact judgement and insight, appropriate  Data Reviewed: I have personally reviewed following labs and imaging studies  CBC:  Recent Labs Lab 03/07/17 2041 03/08/17 0506 03/09/17 0913 03/10/17 0452  WBC 18.1* 17.6* 7.7 6.7  NEUTROABS 15.7*  --   --   --   HGB 14.1 10.9* 10.7* 10.6*  HCT 40.0 31.4* 32.8* 31.1*  MCV 92.2 91.3 94.5 93.1  PLT 220 189 197 878   Basic Metabolic Panel:  Recent Labs Lab 03/07/17 2041 03/08/17 0506 03/09/17 0913 03/10/17 0452  NA 140 139 140 137  K 3.5 3.2* 4.7 3.5  CL 100* 103 103 99*  CO2 30 28 30 30   GLUCOSE 100* 118* 89 114*  BUN 19 18 21* 21*  CREATININE 1.35* 1.15* 1.43* 1.47*  CALCIUM 9.4 8.6* 8.9 8.5*   GFR: Estimated Creatinine Clearance: 22.4 mL/min (A) (by C-G formula based on SCr of 1.47 mg/dL (H)). Liver Function Tests: No results for input(s): AST, ALT, ALKPHOS, BILITOT, PROT, ALBUMIN in the last 168 hours. No results for input(s): LIPASE, AMYLASE in the last 168 hours. No results for input(s): AMMONIA in the last 168 hours. Coagulation Profile: No results for input(s): INR, PROTIME in the last 168 hours. Cardiac Enzymes: No results for input(s): CKTOTAL, CKMB, CKMBINDEX, TROPONINI in the last 168 hours. BNP (last 3 results) No results for input(s): PROBNP in the last 8760 hours. HbA1C: No results for input(s): HGBA1C in the last 72 hours. CBG: No results for input(s): GLUCAP in the last 168 hours. Lipid Profile: No results for input(s): CHOL, HDL, LDLCALC, TRIG, CHOLHDL, LDLDIRECT in the last 72 hours. Thyroid Function Tests: No results for input(s): TSH, T4TOTAL, FREET4, T3FREE, THYROIDAB in the last 72 hours. Anemia Panel: No results for input(s): VITAMINB12, FOLATE,  FERRITIN, TIBC, IRON, RETICCTPCT in the last 72 hours. Urine analysis:    Component Value Date/Time   COLORURINE YELLOW 05/23/2016 0140   APPEARANCEUR CLEAR 05/23/2016 0140   LABSPEC 1.015 05/23/2016 0140   PHURINE 5.5 05/23/2016 0140   GLUCOSEU NEGATIVE 05/23/2016 0140   HGBUR TRACE (A) 05/23/2016 0140   BILIRUBINUR NEGATIVE 05/23/2016 0140   KETONESUR NEGATIVE 05/23/2016 0140   PROTEINUR TRACE (A) 05/23/2016 0140   UROBILINOGEN 0.2 01/19/2013 0040   NITRITE NEGATIVE 05/23/2016 0140   LEUKOCYTESUR NEGATIVE 05/23/2016 0140   Sepsis Labs: @LABRCNTIP (procalcitonin:4,lacticidven:4)  ) Recent Results (from the past 240 hour(s))  Blood culture (routine x 2)     Status: None (Preliminary result)   Collection Time: 03/07/17  9:00 PM  Result Value Ref Range Status   Specimen Description BLOOD BLOOD LEFT ARM  Final   Special Requests   Final    BOTTLES DRAWN AEROBIC AND ANAEROBIC Blood Culture adequate volume  Culture   Final    NO GROWTH 2 DAYS Performed at [facility], [place] [address] [place], [place] [address]    Report Status PENDING  Incomplete  Blood culture (routine x 2)     Status: None (Preliminary result)   Collection Time: 03/07/17  9:18 PM  Result Value Ref Range Status   Specimen Description BLOOD LEFT ANTECUBITAL  Final   Special Requests   Final    BOTTLES DRAWN AEROBIC AND ANAEROBIC Blood Culture adequate volume   Culture   Final    NO GROWTH 2 DAYS Performed at [facility], [place] [address] [place],  [address]    Report Status PENDING  Incomplete  MRSA PCR Screening     Status: None   Collection Time: 03/08/17  1:59 AM  Result Value Ref Range Status   MRSA by PCR NEGATIVE NEGATIVE Final    Comment:        The GeneXpert MRSA Assay (FDA approved for NASAL specimens only), is one component of a comprehensive MRSA colonization surveillance program. It is not intended to diagnose MRSA infection nor to guide or monitor treatment for MRSA  infections.       Radiology Studies: Dg Foot Complete Right  Result Date: 03/09/2017 CLINICAL DATA:  Followup cellulitis. EXAM: RIGHT FOOT COMPLETE - 3+ VIEW COMPARISON:  05/22/2016 FINDINGS: Soft tissue following is identified lateral to the midfoot, not associated with radiopaque foreign body or soft tissue gas. Bones appear radiolucent. There is no acute fracture. No significant cortical erosion. Patient has an Achilles spur. IMPRESSION: Soft tissue swelling.  No acute bony abnormality. Electronically Signed   By: Nolon Nations M.D.   On: 03/09/2017 18:32     Scheduled Meds: . amitriptyline  75 mg Oral QHS  . apixaban  5 mg Oral BID  . calcium-vitamin D  1 tablet Oral q morning - 10a  . citalopram  20 mg Oral Daily  . donepezil  10 mg Oral QHS  . furosemide  40 mg Oral BID  . gabapentin  300 mg Oral BID  . hydrOXYzine  10 mg Oral TID  . potassium chloride  40 mEq Oral Daily  . rOPINIRole  2 mg Oral QHS  . verapamil  180 mg Oral QHS   Continuous Infusions: . cefTRIAXone (ROCEPHIN)  IV       LOS: 2 days   Time Spent in minutes   30 minutes  Kolby Myung D.O. on 03/10/2017 at 11:21 AM  Between 7am to 7pm - Pager - [phone]  After 7pm go to [email or web address] - password [email or web address]  And look for the night coverage person covering for me after hours  [facility] Office  [phone]

## 2017-03-11 DIAGNOSIS — F0390 Unspecified dementia without behavioral disturbance: Secondary | ICD-10-CM

## 2017-03-11 DIAGNOSIS — Z23 Encounter for immunization: Secondary | ICD-10-CM | POA: Diagnosis not present

## 2017-03-11 DIAGNOSIS — F329 Major depressive disorder, single episode, unspecified: Secondary | ICD-10-CM

## 2017-03-11 LAB — CBC
HEMATOCRIT: 32.4 % — AB (ref 36.0–46.0)
Hemoglobin: 10.8 g/dL — ABNORMAL LOW (ref 12.0–15.0)
MCH: 30.3 pg (ref 26.0–34.0)
MCHC: 33.3 g/dL (ref 30.0–36.0)
MCV: 90.8 fL (ref 78.0–100.0)
Platelets: 204 10*3/uL (ref 150–400)
RBC: 3.57 MIL/uL — ABNORMAL LOW (ref 3.87–5.11)
RDW: 13.3 % (ref 11.5–15.5)
WBC: 5.2 10*3/uL (ref 4.0–10.5)

## 2017-03-11 LAB — BASIC METABOLIC PANEL
ANION GAP: 9 (ref 5–15)
BUN: 20 mg/dL (ref 6–20)
CALCIUM: 8.7 mg/dL — AB (ref 8.9–10.3)
CO2: 29 mmol/L (ref 22–32)
Chloride: 101 mmol/L (ref 101–111)
Creatinine, Ser: 1.14 mg/dL — ABNORMAL HIGH (ref 0.44–1.00)
GFR calc Af Amer: 48 mL/min — ABNORMAL LOW (ref >60)
GFR calc non Af Amer: 41 mL/min — ABNORMAL LOW (ref >60)
GLUCOSE: 105 mg/dL — AB (ref 65–99)
POTASSIUM: 3.8 mmol/L (ref 3.5–5.1)
Sodium: 139 mmol/L (ref 135–145)

## 2017-03-11 MED ORDER — CEFUROXIME AXETIL 500 MG PO TABS: 500.0000 mg | ORAL_TABLET | Freq: Two times a day (BID) | ORAL | 0 refills | Status: AC

## 2017-03-11 NOTE — Progress Notes (Signed)
Physical Therapy Treatment Patient Details Name: [name] Walker MRN: [id number] DOB: [birth year] Today's Date: 03/11/2017    History of Present Illness Patient is an 83 yo female admitted 03/07/17 with swelling, reddness, and pain in RLE.  Patient with cellulitis.   PMH:  COPD, prior DVT on chronic eliquis, HTN, depression, OA, bilateral THA, bilateral TKA, back surgery    PT Comments    Pt agreeable to ambulate and reports d/c home later today.  Pt feels ready for d/c and wishes to return home and take care of her outdoor cats.  Follow Up Recommendations  Home health PT;Supervision - Intermittent     Equipment Recommendations  None recommended by PT    Recommendations for Other Services       Precautions / Restrictions Precautions Precautions: Fall Restrictions Weight Bearing Restrictions: No    Mobility  Bed Mobility               General bed mobility comments: pt up in recliner  Transfers Overall transfer level: Modified independent Equipment used: Rolling walker (2 wheeled)             General transfer comment: Patient used correct hand position and technique.  Increased time.  Ambulation/Gait Ambulation/Gait assistance: Supervision;Modified independent (Device/Increase time) Ambulation Distance (Feet): 40 Feet Assistive device: Rolling walker (2 wheeled) Gait Pattern/deviations: Step-through pattern;Decreased stride length;Trunk flexed Gait velocity: decreased   General Gait Details: verbal cues for upright posture, steady with RW, pt reports distance limited to save energy for home   Stairs            Wheelchair Mobility    Modified Rankin (Stroke Patients Only)       Balance                                            Cognition Arousal/Alertness: Awake/alert Behavior During Therapy: WFL for tasks assessed/performed Overall Cognitive Status: Within Functional Limits for tasks assessed                                         Exercises      General Comments        Pertinent Vitals/Pain Pain Assessment: No/denies pain    Home Living                      Prior Function            PT Goals (current goals can now be found in the care plan section) Progress towards PT goals: Progressing toward goals    Frequency           PT Plan Current plan remains appropriate    Co-evaluation              AM-PAC PT "6 Clicks" Daily Activity  Outcome Measure  Difficulty turning over in bed (including adjusting bedclothes, sheets and blankets)?: None Difficulty moving from lying on back to sitting on the side of the bed? : A Little Difficulty sitting down on and standing up from a chair with arms (e.g., wheelchair, bedside commode, etc,.)?: None Help needed moving to and from a bed to chair (including a wheelchair)?: None Help needed walking in hospital room?: A Little Help needed climbing 3-5 steps with a railing? : A Little  6 Click Score: 21    End of Session   Activity Tolerance: Patient tolerated treatment well Patient left: in chair;with call bell/phone within reach   PT Visit Diagnosis: Other abnormalities of gait and mobility (R26.89);Muscle weakness (generalized) (M62.81)     Time: 1941-7408 PT Time Calculation (min) (ACUTE ONLY): 22 min  Charges:  $Gait Training: 8-22 mins                    G Codes:       Carmelia Bake, PT, DPT 03/11/2017 Pager: [phone]  York Ram E 03/11/2017, 2:41 PM

## 2017-03-11 NOTE — Discharge Instructions (Signed)

## 2017-03-11 NOTE — Progress Notes (Signed)
Pt's vitals are WNL, tolerating diet and pain is under control. Discussed discharge instructions with patient. Discharged to home with prescriptions. Dressing change was done before discharge.

## 2017-03-11 NOTE — Discharge Summary (Signed)
Physician Discharge Summary  [name] Walker CHY:[id number] DOB: Aug 29, 1925 DOA: 03/07/2017  PCP: Claretta Fraise, MD  Admit date: 03/07/2017 Discharge date: 03/11/2017  Time spent: 45 minutes  Recommendations for Outpatient Follow-up:  Patient will be discharged to home with home health services.  Patient will need to follow up with primary care provider within one week of discharge.  Patient should continue medications as prescribed.  Patient should follow a heart healthy diet.   Discharge Diagnoses:  Right lower respiratory cellulitis History of DVT/Chronic Atrial fribrillation Essential hypertension Dementia Depression Hypokalemia Chronic kidney disease, stage III Physical deconditioning Normocytic anemia  Discharge Condition: Stable  Diet recommendation: heart healthy  Filed Weights   03/08/17 0125  Weight: 78.5 kg (173 lb 1.6 oz)    History of present illness:  on 03/08/2017 by Dr. Dewayne Shorter Cardwellis a 83 y.o.femalewith medical history significant of COPD, prior DVT on chronic eliquis, HTN. Patient had cellulitis last Sept which rapidly failed outpatient therapy and developed into septic shock 24 hours later. MRSA positive in nares, treated with zosyn / vanc. Patient presents to the ED with c/o pain in R foot and leg. This onset gradually last night. Redness and pain started in foot and rapidly progressed proximally in leg today. Pain worse with weightbearing or palpation of area, better remaining still. States she hasnt missed a dose of eliquis.  Hospital Course:  Right lower respiratory cellulitis -Also noted on right foot with blistering, suspect this is nidus of infection -leukocytosis resolved -Wound care consulted and appreciated -was placed on  IV vancomycin -patient with slight increase in creatinin, IV vanc discontinued and placed on ceftriaxone -Blood cultures Show no growth to date -Lower extremity Doppler negative for DVT of  RLE -Right foot xray shows soft tissue swelling, no bony abnormality -Erythema and tenderness improved -Will discharge patient with ceftin -PT consulted and recommended home health and supervision. Discussed with patient and she wishes to return home and states she has good support.   History of DVT/Chronic Atrial fribrillation -Continue Eliquis -CHADVASC 4 -Currently rate controlled  Essential hypertension -Continue verapamil, lasix  Dementia -Continue Aricept  Depression -Continue Celexa  Hypokalemia -Resolved with replacement  Chronic kidney disease, stage III -Creatinine appears to be stable, continue to monitor BMP -Baseline creatinine approximately 1.1-1.2, peaked to 1.47 (however, GFR still in stage III) -Currently 1.14  Physical deconditioning -PT consulted, recommended HH  Normocytic anemia -Hemoglobin currently 10.8, continue monitor CBC -Baseline hemoglobin approximately 9-10  Code status: DNR  Procedures: Right lower extremity doppler  Consultations: None  Discharge Exam: Vitals:   03/10/17 2205 03/11/17 0510  BP: 132/71 119/89  Pulse: 66 66  Resp: 18 15  Temp: 98.4 F (36.9 C) 98.2 F (36.8 C)   Patient denies further right leg pain or soreness. States she feels much better today. Denies chest pain, shortness of breath,a abdominal pain, nausea, vomiting, diarrhea, constipation, headache, dizziness.    General: Well developed, well nourished, NAD, appears stated age  HEENT: NCAT, mucous membranes moist.  Cardiovascular: S1 S2 auscultated, no rubs, murmurs or gallops. Regular rate and rhythm.  Respiratory: Clear to auscultation bilaterally with equal chest rise, no wheezing   Abdomen: Soft, nontender, nondistended, + bowel sounds  Extremities: warm dry without cyanosis clubbing or edema LLE. RLE erythema, dressing in place on lateral aspect of right foot, erythema improved on foot and chin.  Neuro: AAOx3, nonfocal  Psych: Normal  affect and demeanor with intact judgement and insight  Discharge Instructions Discharge Instructions  Discharge instructions    Complete by:  As directed    Patient will be discharged to home with home health services.  Patient will need to follow up with primary care provider within one week of discharge.  Patient should continue medications as prescribed.  Patient should follow a heart healthy diet.     Current Discharge Medication List    START taking these medications   Details  cefUROXime (CEFTIN) 500 MG tablet Take 1 tablet (500 mg total) by mouth 2 (two) times daily. Qty: 20 tablet, Refills: 0      CONTINUE these medications which have NOT CHANGED   Details  amitriptyline (ELAVIL) 75 MG tablet TAKE 1 TABLET AT BEDTIME Qty: 30 tablet, Refills: 2    apixaban (ELIQUIS) 5 MG TABS tablet Take 1 tablet (5 mg total) by mouth 2 (two) times daily. Qty: 60 tablet, Refills: 2    citalopram (CELEXA) 20 MG tablet TAKE 1 TABLET DAILY Qty: 30 tablet, Refills: 2    donepezil (ARICEPT) 10 MG tablet TAKE ONE TABLET AT BEDTIME Qty: 90 tablet, Refills: 0    furosemide (LASIX) 40 MG tablet TAKE (2) TABLETS DAILY Qty: 60 tablet, Refills: 1    gabapentin (NEURONTIN) 300 MG capsule TAKE (1) CAPSULE TWICE DAILY. Qty: 60 capsule, Refills: 2    hydrOXYzine (ATARAX/VISTARIL) 10 MG tablet TAKE (1) TABLET THREE TIMES DAILY. Qty: 90 tablet, Refills: 0    OXYGEN Inhale into the lungs at bedtime.    potassium chloride (K-DUR) 10 MEQ tablet TAKE 4 TABLETS ONCE DAILY Qty: 120 tablet, Refills: 2    rOPINIRole (REQUIP) 1 MG tablet TAKE 1 OR 2 TABLETS AT BEDTIME Qty: 60 tablet, Refills: 5    verapamil (CALAN-SR) 180 MG CR tablet TAKE ONE TABLET AT BEDTIME Qty: 30 tablet, Refills: 4    albuterol (PROVENTIL) (2.5 MG/3ML) 0.083% nebulizer solution Take 3 mLs (2.5 mg total) by nebulization every 2 (two) hours as needed for wheezing or shortness of breath. Qty: 75 mL, Refills: 12    Calcium  Carbonate-Vitamin D (CALCIUM-VITAMIN D) 500-200 MG-UNIT tablet Take 1 tablet by mouth every morning.    ibuprofen (ADVIL,MOTRIN) 200 MG tablet Take 200 mg by mouth every 6 (six) hours as needed for moderate pain.    omeprazole (PRILOSEC) 20 MG capsule TAKE 2 CAPSULES DAILY AS NEEDED FOR REFLUX Qty: 60 capsule, Refills: 5    Vitamin D-Vitamin K (VITAMIN K2-VITAMIN D3 PO) Take 2 capsules by mouth every morning.       Allergies  Allergen Reactions  . Diltiazem Anxiety and Other (See Comments)    Sleep disturbance  . Iohexol      Desc: THROAT SWELLING-NOTED IN CHART AFTER IVC PLACEMENT-ARS 12-02-05   . Latex Other (See Comments)    unknown  . Penicillins Swelling    Patient was given zosyn this admission and tolerated. After d/w MD and pt, she had swelling in arm only, so possible just infiltration and not allergic. ABx changed to Cefazolin and is tolerating  . Shellfish Allergy Other (See Comments)    Reaction unknown   Follow-up Information    [facility].   Why:  nurse and physical therapy Contact information: [address] [place] [address] [phone]        Claretta Fraise, MD. Schedule an appointment as soon as possible for a visit in 1 week(s).   Specialty:  Family Medicine Why:  Hospital follow up Contact information: [place] [address]  [phone]            The results of significant diagnostics from this hospitalization (including imaging, microbiology, ancillary and laboratory) are listed below for reference.    Significant Diagnostic Studies: Dg Foot Complete Right  Result Date: 03/09/2017 CLINICAL DATA:  Followup cellulitis. EXAM: RIGHT FOOT COMPLETE - 3+ VIEW COMPARISON:  05/22/2016 FINDINGS: Soft tissue following is identified lateral to the midfoot, not associated with radiopaque foreign body or soft tissue gas. Bones appear radiolucent. There is no acute fracture. No significant cortical  erosion. Patient has an Achilles spur. IMPRESSION: Soft tissue swelling.  No acute bony abnormality. Electronically Signed   By: Nolon Nations M.D.   On: 03/09/2017 18:32    Microbiology: Recent Results (from the past 240 hour(s))  Blood culture (routine x 2)     Status: None (Preliminary result)   Collection Time: 03/07/17  9:00 PM  Result Value Ref Range Status   Specimen Description BLOOD BLOOD LEFT ARM  Final   Special Requests   Final    BOTTLES DRAWN AEROBIC AND ANAEROBIC Blood Culture adequate volume   Culture   Final    NO GROWTH 2 DAYS Performed at [facility], [address] [place], [place] [address]    Report Status PENDING  Incomplete  Blood culture (routine x 2)     Status: None (Preliminary result)   Collection Time: 03/07/17  9:18 PM  Result Value Ref Range Status   Specimen Description BLOOD LEFT ANTECUBITAL  Final   Special Requests   Final    BOTTLES DRAWN AEROBIC AND ANAEROBIC Blood Culture adequate volume   Culture   Final    NO GROWTH 2 DAYS Performed at [facility], [place] [address], [place], [place] [address]    Report Status PENDING  Incomplete  MRSA PCR Screening     Status: None   Collection Time: 03/08/17  1:59 AM  Result Value Ref Range Status   MRSA by PCR NEGATIVE NEGATIVE Final    Comment:        The GeneXpert MRSA Assay (FDA approved for NASAL specimens only), is one component of a comprehensive MRSA colonization surveillance program. It is not intended to diagnose MRSA infection nor to guide or monitor treatment for MRSA infections.      Labs: Basic Metabolic Panel:  Recent Labs Lab 03/07/17 2041 03/08/17 0506 03/09/17 0913 03/10/17 0452 03/11/17 0423  NA 140 139 140 137 139  K 3.5 3.2* 4.7 3.5 3.8  CL 100* 103 103 99* 101  CO2 30 28 30 30 29   GLUCOSE 100* 118* 89 114* 105*  BUN 19 18 21* 21* 20  CREATININE 1.35* 1.15* 1.43* 1.47* 1.14*  CALCIUM 9.4 8.6* 8.9 8.5* 8.7*   Liver Function Tests: No results  for input(s): AST, ALT, ALKPHOS, BILITOT, PROT, ALBUMIN in the last 168 hours. No results for input(s): LIPASE, AMYLASE in the last 168 hours. No results for input(s): AMMONIA in the last 168 hours. CBC:  Recent Labs Lab 03/07/17 2041 03/08/17 0506 03/09/17 0913 03/10/17 0452 03/11/17 0423  WBC 18.1* 17.6* 7.7 6.7 5.2  NEUTROABS 15.7*  --   --   --   --   HGB 14.1 10.9* 10.7* 10.6* 10.8*  HCT 40.0 31.4* 32.8* 31.1* 32.4*  MCV 92.2 91.3 94.5 93.1 90.8  PLT 220 189 197 215 204   Cardiac Enzymes: No results for input(s): CKTOTAL, CKMB, CKMBINDEX, TROPONINI in the last 168 hours. BNP: BNP (last 3  results) No results for input(s): BNP in the last 8760 hours.  ProBNP (last 3 results) No results for input(s): PROBNP in the last 8760 hours.  CBG: No results for input(s): GLUCAP in the last 168 hours.     SignedCristal Ford  [facility] 03/11/2017, 12:13 PM

## 2017-03-11 NOTE — Progress Notes (Signed)
Spoke with patient at bedside. She states she lives at home alone, she has family that live on either side of her and are available as needed. They assist with meals, meds, and doctor appointments. She has a recliner, power w/c, walker, bedside commode. She has used [facility] in the past for [facility] services, would like to use them again at d/c. She has used O2 in the past but states she does not use it consistently now, just as needed. She was not wearing O2 during the visit, no s/s of SOB or difficulty breathing. She is eager to d/c today, states her family will come to take her home.

## 2017-03-12 ENCOUNTER — Telehealth: Payer: Self-pay | Admitting: *Deleted

## 2017-03-12 DIAGNOSIS — Z86718 Personal history of other venous thrombosis and embolism: Secondary | ICD-10-CM | POA: Diagnosis not present

## 2017-03-12 DIAGNOSIS — Z4800 Encounter for change or removal of nonsurgical wound dressing: Secondary | ICD-10-CM | POA: Diagnosis not present

## 2017-03-12 DIAGNOSIS — L03115 Cellulitis of right lower limb: Secondary | ICD-10-CM | POA: Diagnosis not present

## 2017-03-12 DIAGNOSIS — Z9981 Dependence on supplemental oxygen: Secondary | ICD-10-CM | POA: Diagnosis not present

## 2017-03-12 DIAGNOSIS — J449 Chronic obstructive pulmonary disease, unspecified: Secondary | ICD-10-CM | POA: Diagnosis not present

## 2017-03-12 DIAGNOSIS — I482 Chronic atrial fibrillation: Secondary | ICD-10-CM | POA: Diagnosis not present

## 2017-03-12 DIAGNOSIS — I129 Hypertensive chronic kidney disease with stage 1 through stage 4 chronic kidney disease, or unspecified chronic kidney disease: Secondary | ICD-10-CM | POA: Diagnosis not present

## 2017-03-12 DIAGNOSIS — N183 Chronic kidney disease, stage 3 (moderate): Secondary | ICD-10-CM | POA: Diagnosis not present

## 2017-03-12 DIAGNOSIS — Z791 Long term (current) use of non-steroidal anti-inflammatories (NSAID): Secondary | ICD-10-CM | POA: Diagnosis not present

## 2017-03-12 DIAGNOSIS — Z7951 Long term (current) use of inhaled steroids: Secondary | ICD-10-CM | POA: Diagnosis not present

## 2017-03-12 DIAGNOSIS — C07 Malignant neoplasm of parotid gland: Secondary | ICD-10-CM | POA: Diagnosis not present

## 2017-03-12 DIAGNOSIS — D649 Anemia, unspecified: Secondary | ICD-10-CM | POA: Diagnosis not present

## 2017-03-12 NOTE — Telephone Encounter (Signed)
Call Completed and Appointment Scheduled: Yes, Date: 03/15/17 with Dr Livia Snellen   DISCHARGE INFORMATION Date of Discharge:03/11/17  [facility]  Principal Discharge Diagnosis: Cellulitis R foot and leg  Patient and/or caregiver is knowledgeable of his/her condition(s) and treatment: Yes  MEDICATION RECONCILIATION Current medication list reviewed with patient:Yes  Outpatient Encounter Prescriptions as of 03/12/2017  Medication Sig  . albuterol (PROVENTIL) (2.5 MG/3ML) 0.083% nebulizer solution Take 3 mLs (2.5 mg total) by nebulization every 2 (two) hours as needed for wheezing or shortness of breath.  Marland Kitchen amitriptyline (ELAVIL) 75 MG tablet TAKE 1 TABLET AT BEDTIME  . apixaban (ELIQUIS) 5 MG TABS tablet Take 1 tablet (5 mg total) by mouth 2 (two) times daily.  . Calcium Carbonate-Vitamin D (CALCIUM-VITAMIN D) 500-200 MG-UNIT tablet Take 1 tablet by mouth every morning.  . cefUROXime (CEFTIN) 500 MG tablet Take 1 tablet (500 mg total) by mouth 2 (two) times daily.  . citalopram (CELEXA) 20 MG tablet TAKE 1 TABLET DAILY (Patient taking differently: TAKE 1 TABLET PO DAILY)  . donepezil (ARICEPT) 10 MG tablet TAKE ONE TABLET AT BEDTIME  . furosemide (LASIX) 40 MG tablet TAKE (2) TABLETS DAILY (Patient taking differently: TAKE 1 TABLET PO BID)  . gabapentin (NEURONTIN) 300 MG capsule TAKE (1) CAPSULE TWICE DAILY.  . hydrOXYzine (ATARAX/VISTARIL) 10 MG tablet TAKE (1) TABLET THREE TIMES DAILY.  Marland Kitchen ibuprofen (ADVIL,MOTRIN) 200 MG tablet Take 200 mg by mouth every 6 (six) hours as needed for moderate pain.  Marland Kitchen omeprazole (PRILOSEC) 20 MG capsule TAKE 2 CAPSULES DAILY AS NEEDED FOR REFLUX (Patient taking differently: TAKE 2 CAPSULES PO DAILY AS NEEDED FOR REFLUX)  . OXYGEN Inhale into the lungs at bedtime.  . potassium chloride (K-DUR) 10 MEQ tablet TAKE 4 TABLETS ONCE DAILY  . rOPINIRole (REQUIP) 1 MG tablet TAKE 1 OR 2 TABLETS AT BEDTIME (Patient taking differently: TAKE 2 TABLETS PO  AT BEDTIME)  . verapamil (CALAN-SR) 180 MG CR tablet TAKE ONE TABLET AT BEDTIME  . Vitamin D-Vitamin K (VITAMIN K2-VITAMIN D3 PO) Take 2 capsules by mouth every morning.   No facility-administered encounter medications on file as of 03/12/2017.     Discharge Medications reviewed and reconciled with current medications.yes  Patient is able to obtain needed medications:Yes  ACTIVITIES OF DAILY LIVING  Is the patient able to perform his/her own ADLs: Yes.    Patient is receiving home health services: Yes.   Home health is scheduled to come out today at 12:00.   PATIENT EDUCATION Questions/Concerns Discussed: No concerns at this time. Patient is tolerating antibiotics. She will call if she needs anything before her appointment.

## 2017-03-13 DIAGNOSIS — L03115 Cellulitis of right lower limb: Secondary | ICD-10-CM | POA: Diagnosis not present

## 2017-03-13 DIAGNOSIS — Z791 Long term (current) use of non-steroidal anti-inflammatories (NSAID): Secondary | ICD-10-CM | POA: Diagnosis not present

## 2017-03-13 LAB — CULTURE, BLOOD (ROUTINE X 2)
Culture: NO GROWTH
Culture: NO GROWTH
SPECIAL REQUESTS: ADEQUATE
Special Requests: ADEQUATE

## 2017-03-14 DIAGNOSIS — N183 Chronic kidney disease, stage 3 (moderate): Secondary | ICD-10-CM | POA: Diagnosis not present

## 2017-03-14 DIAGNOSIS — L03115 Cellulitis of right lower limb: Secondary | ICD-10-CM | POA: Diagnosis not present

## 2017-03-14 DIAGNOSIS — Z86718 Personal history of other venous thrombosis and embolism: Secondary | ICD-10-CM | POA: Diagnosis not present

## 2017-03-14 DIAGNOSIS — Z4800 Encounter for change or removal of nonsurgical wound dressing: Secondary | ICD-10-CM | POA: Diagnosis not present

## 2017-03-14 DIAGNOSIS — I482 Chronic atrial fibrillation: Secondary | ICD-10-CM | POA: Diagnosis not present

## 2017-03-14 DIAGNOSIS — Z9981 Dependence on supplemental oxygen: Secondary | ICD-10-CM | POA: Diagnosis not present

## 2017-03-14 DIAGNOSIS — D649 Anemia, unspecified: Secondary | ICD-10-CM | POA: Diagnosis not present

## 2017-03-14 DIAGNOSIS — Z791 Long term (current) use of non-steroidal anti-inflammatories (NSAID): Secondary | ICD-10-CM | POA: Diagnosis not present

## 2017-03-14 DIAGNOSIS — Z7951 Long term (current) use of inhaled steroids: Secondary | ICD-10-CM | POA: Diagnosis not present

## 2017-03-14 DIAGNOSIS — I129 Hypertensive chronic kidney disease with stage 1 through stage 4 chronic kidney disease, or unspecified chronic kidney disease: Secondary | ICD-10-CM | POA: Diagnosis not present

## 2017-03-14 DIAGNOSIS — C07 Malignant neoplasm of parotid gland: Secondary | ICD-10-CM | POA: Diagnosis not present

## 2017-03-14 DIAGNOSIS — J449 Chronic obstructive pulmonary disease, unspecified: Secondary | ICD-10-CM | POA: Diagnosis not present

## 2017-03-15 ENCOUNTER — Other Ambulatory Visit: Payer: Self-pay | Admitting: Licensed Clinical Social Worker

## 2017-03-15 ENCOUNTER — Ambulatory Visit (INDEPENDENT_AMBULATORY_CARE_PROVIDER_SITE_OTHER): Payer: Medicare Other | Admitting: Family Medicine

## 2017-03-15 VITALS — BP 110/67 | HR 69 | Temp 98.2°F | Ht <= 58 in | Wt 176.0 lb

## 2017-03-15 DIAGNOSIS — L03119 Cellulitis of unspecified part of limb: Secondary | ICD-10-CM

## 2017-03-15 DIAGNOSIS — L02619 Cutaneous abscess of unspecified foot: Secondary | ICD-10-CM | POA: Diagnosis not present

## 2017-03-15 DIAGNOSIS — Z7901 Long term (current) use of anticoagulants: Secondary | ICD-10-CM

## 2017-03-15 DIAGNOSIS — L89892 Pressure ulcer of other site, stage 2: Secondary | ICD-10-CM | POA: Diagnosis not present

## 2017-03-15 DIAGNOSIS — I482 Chronic atrial fibrillation, unspecified: Secondary | ICD-10-CM

## 2017-03-15 NOTE — Patient Outreach (Signed)
Assessment:  CSW spoke via phone with client. CSW verified client identity. CSW received verbal permission from client on 03/15/17 for CSW to communicate with client about current client status and needs. Client said she had her prescribed medications. Client sees Dr. Claretta Fraise as primary care doctor. Client is trying to attend scheduled client medical appointments.  Client uses a walker to assist her with ambulation. Client's daughter, Alajiah Dutkiewicz, is supportive of client. CSW and client spoke of client care plan. CSW encouraged client to communicate with CSW in next 30 days to discuss community resources of assistance for client. Client has a lift chair in the home and uses the lift chair as needed. Client uses oxygen as prescribed at night to assist client with breathing.  Client receives transport assistance as needed from family members to transport client to and from client's scheduled medical appointments. Client wears a Scientist, research (medical) and uses [facility] as needed for emergency assistance for client.  Client wishes to continue residing at her home with needed supports in place. CSW has provided client with name, address and phone number of [employer] food pantry for food assistance. Yehuda Savannah, client's daughter, lives nearby and visits with client regularly and checks on client needs. Client is not having pain issues at present. Client is able to afford prescribed monthly medications. CSW has informed client and daughter of client about [facility] progam services in nursing, social work and pharmacy.  CSW encouraged that client and daughter, Nigeria Lasseter, call CSW at [phone] as needed to discuss social work needs of client.   Plan:  Client to communicate with CSW in next 30 days to discuss community resources of assistance for client.  CSW to call client or Bryony Kaman, daughter of client, in 4 weeks to assess client needs at that time.  [name].Agron Swiney  MSW, LCSW Licensed Clinical Social Worker [facility] Care Management [phone]

## 2017-03-15 NOTE — Progress Notes (Signed)
Subjective:  Patient ID: [name] Walker, female    DOB: April 09, 1925  Age: 83 y.o. MRN: [id number]  CC: transitional care (pt here today following up after being discharged from [facility])   HPI [name] Walker presents for Transition back to office care after treatment for cellulitis of her right foot and leg at [facility]. Her date of discharge was 03/11/2017. She was admitted on 03/08/2017. She was treated for cellulitis of the right foot as well as followed for her chronic atrial fibrillation and essential hypertension. She is receiving home healthcare services. She is getting wound care dressings. She says that the wound on her right foot just exploded. A friend with her says that she thinks it was a boil that opened. However it was infected and she was placed on antibiotics in house and discharged on cefuroxime 500 mg twice a day. 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   She has a past surgical history that includes Total hip arthroplasty; Knee Arthroplasty; Esophagogastroduodenoscopy (10/17/2007); Colonoscopy; Back surgery; Abdominal hysterectomy; Cholecystectomy; Cataracts; and Parotidectomy (Left, 05/17/2015).   Her family history includes Cancer in her sister; Rheum arthritis in her mother; Ulcers in her father and mother.She reports that she has never smoked. She has never used smokeless tobacco. She reports that she does not drink alcohol or use drugs.    ROS Review of Systems  Constitutional: Positive for activity change. Negative for appetite change and fever.  HENT: Negative for  congestion.   Eyes: Negative for visual disturbance.  Respiratory: Negative for cough and shortness of breath.   Cardiovascular: Negative for chest pain and palpitations.  Gastrointestinal: Negative for abdominal pain, diarrhea and nausea.  Genitourinary: Negative for dysuria.  Musculoskeletal: Positive for gait problem (foot pain). Negative for arthralgias and myalgias.  Neurological: Positive for speech difficulty.    Objective:  BP 110/67   Pulse 69   Temp 98.2 F (36.8 C) (Oral)   Ht 4\' 9"  (1.448 m)   Wt 176 lb (79.8 kg)   SpO2 96%   BMI 38.09 kg/m   BP Readings from Last 3 Encounters:  03/15/17 110/67  03/11/17 119/89  02/12/17 (!) 138/59    Wt Readings from Last 3 Encounters:  03/15/17 176 lb (79.8 kg)  03/08/17 173 lb 1.6 oz (78.5 kg)  02/12/17 178 lb (80.7 kg)     Physical Exam  Constitutional: She is oriented to person, place, and time. She appears well-developed and well-nourished. No distress.  HENT:  Head: Normocephalic and atraumatic.  Right Ear: External ear normal.  Left Ear: External ear normal.  Nose: Nose normal.  Mouth/Throat: Oropharynx is clear and moist.  Eyes: Conjunctivae and EOM are normal. Pupils are equal, round, and reactive to light.  Neck: Normal range of motion. Neck supple. No thyromegaly present.  Cardiovascular: Normal rate, regular rhythm and normal heart sounds.   No murmur heard. Pulmonary/Chest: Effort normal and breath sounds normal. No respiratory distress. She has no wheezes. She has no rales.  Abdominal: Soft. Bowel sounds are normal. She exhibits no distension. There is no tenderness.  Lymphadenopathy:    She has no cervical adenopathy.  Neurological: She is alert and oriented to  person, place, and time. She has normal reflexes.  Skin: Skin is warm and dry.  There is moderate erythema to the lateral midfoot of the right foot. There is a central grade 2 decubitus measuring 1 cm. Dressing was removed and fresh dressing with  good arm applied after evaluation. There is no slough or fat layer visible  Psychiatric: She has a normal mood and affect. Her behavior is normal. Judgment and thought content normal.      Assessment & Plan:   [name] Walker was seen today for transitional care.  Diagnoses and all orders for this visit:  Cellulitis and abscess of foot  Pressure ulcer of dorsum of right foot, stage 2  Atrial fibrillation, chronic (HCC)  Chronic anticoagulation       I am having [name] Walker maintain her albuterol, calcium-vitamin D, Vitamin D-Vitamin K (VITAMIN K2-VITAMIN D3 PO), OXYGEN, ibuprofen, verapamil, donepezil, potassium chloride, furosemide, apixaban, omeprazole, gabapentin, citalopram, rOPINIRole, amitriptyline, hydrOXYzine, cefUROXime, levocetirizine, and triamcinolone cream.  Allergies as of 03/15/2017      Reactions   Diltiazem Anxiety, Other (See Comments)   Sleep disturbance   Iohexol     Desc: THROAT SWELLING-NOTED IN CHART AFTER IVC PLACEMENT-ARS 12-02-05   Latex Other (See Comments)   unknown   Penicillins Swelling   Patient was given zosyn this admission and tolerated. After d/w MD and pt, she had swelling in arm only, so possible just infiltration and not allergic. ABx changed to Cefazolin and is tolerating   Shellfish Allergy Other (See Comments)   Reaction unknown      Medication List       Accurate as of 03/15/17  4:26 PM. Always use your most recent med list.          albuterol (2.5 MG/3ML) 0.083% nebulizer solution Commonly known as:  PROVENTIL Take 3 mLs (2.5 mg total) by nebulization every 2 (two) hours as needed for wheezing or shortness of breath.   amitriptyline 75 MG tablet Commonly known as:  ELAVIL TAKE 1 TABLET AT BEDTIME   apixaban 5 MG Tabs tablet Commonly known as:  ELIQUIS Take 1 tablet (5 mg total) by mouth 2 (two) times daily.   calcium-vitamin D 500-200 MG-UNIT tablet Take 1 tablet by mouth every morning.   cefUROXime 500 MG  tablet Commonly known as:  CEFTIN Take 1 tablet (500 mg total) by mouth 2 (two) times daily.   citalopram 20 MG tablet Commonly known as:  CELEXA TAKE 1 TABLET DAILY   donepezil 10 MG tablet Commonly known as:  ARICEPT TAKE ONE TABLET AT BEDTIME   furosemide 40 MG tablet Commonly known as:  LASIX TAKE (2) TABLETS DAILY   gabapentin 300 MG capsule Commonly known as:  NEURONTIN TAKE (1) CAPSULE TWICE DAILY.   hydrOXYzine 10 MG tablet Commonly known as:  ATARAX/VISTARIL TAKE (1) TABLET THREE TIMES DAILY.   ibuprofen 200 MG tablet Commonly known as:  ADVIL,MOTRIN Take 200 mg by mouth every 6 (six) hours as needed for moderate pain.   levocetirizine 5 MG tablet Commonly known as:  XYZAL Take 5 mg by mouth at bedtime.   omeprazole 20 MG capsule Commonly known as:  PRILOSEC TAKE 2 CAPSULES DAILY AS NEEDED FOR REFLUX   OXYGEN Inhale into the lungs at bedtime.   potassium chloride 10 MEQ tablet Commonly known as:  K-DUR TAKE 4 TABLETS ONCE DAILY   rOPINIRole 1 MG tablet Commonly known as:  REQUIP TAKE 1 OR 2 TABLETS AT BEDTIME   triamcinolone cream 0.1 %  Commonly known as:  KENALOG   verapamil 180 MG CR tablet Commonly known as:  CALAN-SR TAKE ONE TABLET AT BEDTIME   VITAMIN K2-VITAMIN D3 PO Take 2 capsules by mouth every morning.        Follow-up: Return in about 1 month (around 04/14/2017), or if symptoms worsen or fail to improve.  Claretta Fraise, M.D.

## 2017-03-19 ENCOUNTER — Other Ambulatory Visit: Payer: Self-pay | Admitting: Family Medicine

## 2017-03-19 DIAGNOSIS — C07 Malignant neoplasm of parotid gland: Secondary | ICD-10-CM | POA: Diagnosis not present

## 2017-03-19 DIAGNOSIS — Z9981 Dependence on supplemental oxygen: Secondary | ICD-10-CM | POA: Diagnosis not present

## 2017-03-19 DIAGNOSIS — I482 Chronic atrial fibrillation: Secondary | ICD-10-CM | POA: Diagnosis not present

## 2017-03-19 DIAGNOSIS — J449 Chronic obstructive pulmonary disease, unspecified: Secondary | ICD-10-CM | POA: Diagnosis not present

## 2017-03-19 DIAGNOSIS — Z86718 Personal history of other venous thrombosis and embolism: Secondary | ICD-10-CM | POA: Diagnosis not present

## 2017-03-19 DIAGNOSIS — Z791 Long term (current) use of non-steroidal anti-inflammatories (NSAID): Secondary | ICD-10-CM | POA: Diagnosis not present

## 2017-03-19 DIAGNOSIS — L03115 Cellulitis of right lower limb: Secondary | ICD-10-CM | POA: Diagnosis not present

## 2017-03-19 DIAGNOSIS — I129 Hypertensive chronic kidney disease with stage 1 through stage 4 chronic kidney disease, or unspecified chronic kidney disease: Secondary | ICD-10-CM | POA: Diagnosis not present

## 2017-03-19 DIAGNOSIS — D649 Anemia, unspecified: Secondary | ICD-10-CM | POA: Diagnosis not present

## 2017-03-19 DIAGNOSIS — N183 Chronic kidney disease, stage 3 (moderate): Secondary | ICD-10-CM | POA: Diagnosis not present

## 2017-03-19 DIAGNOSIS — Z4800 Encounter for change or removal of nonsurgical wound dressing: Secondary | ICD-10-CM | POA: Diagnosis not present

## 2017-03-19 DIAGNOSIS — Z7951 Long term (current) use of inhaled steroids: Secondary | ICD-10-CM | POA: Diagnosis not present

## 2017-03-21 DIAGNOSIS — I2510 Atherosclerotic heart disease of native coronary artery without angina pectoris: Secondary | ICD-10-CM | POA: Diagnosis not present

## 2017-03-21 DIAGNOSIS — I700 Atherosclerosis of aorta: Secondary | ICD-10-CM | POA: Diagnosis not present

## 2017-03-21 DIAGNOSIS — Z9889 Other specified postprocedural states: Secondary | ICD-10-CM | POA: Diagnosis not present

## 2017-03-21 DIAGNOSIS — I6523 Occlusion and stenosis of bilateral carotid arteries: Secondary | ICD-10-CM | POA: Diagnosis not present

## 2017-03-21 DIAGNOSIS — R221 Localized swelling, mass and lump, neck: Secondary | ICD-10-CM | POA: Diagnosis not present

## 2017-03-21 DIAGNOSIS — C07 Malignant neoplasm of parotid gland: Secondary | ICD-10-CM | POA: Diagnosis not present

## 2017-03-22 DIAGNOSIS — Z86718 Personal history of other venous thrombosis and embolism: Secondary | ICD-10-CM | POA: Diagnosis not present

## 2017-03-22 DIAGNOSIS — N183 Chronic kidney disease, stage 3 (moderate): Secondary | ICD-10-CM | POA: Diagnosis not present

## 2017-03-22 DIAGNOSIS — I482 Chronic atrial fibrillation: Secondary | ICD-10-CM | POA: Diagnosis not present

## 2017-03-22 DIAGNOSIS — D649 Anemia, unspecified: Secondary | ICD-10-CM | POA: Diagnosis not present

## 2017-03-22 DIAGNOSIS — I129 Hypertensive chronic kidney disease with stage 1 through stage 4 chronic kidney disease, or unspecified chronic kidney disease: Secondary | ICD-10-CM | POA: Diagnosis not present

## 2017-03-22 DIAGNOSIS — L03115 Cellulitis of right lower limb: Secondary | ICD-10-CM | POA: Diagnosis not present

## 2017-03-22 DIAGNOSIS — Z4800 Encounter for change or removal of nonsurgical wound dressing: Secondary | ICD-10-CM | POA: Diagnosis not present

## 2017-03-22 DIAGNOSIS — Z9981 Dependence on supplemental oxygen: Secondary | ICD-10-CM | POA: Diagnosis not present

## 2017-03-22 DIAGNOSIS — Z7951 Long term (current) use of inhaled steroids: Secondary | ICD-10-CM | POA: Diagnosis not present

## 2017-03-22 DIAGNOSIS — C07 Malignant neoplasm of parotid gland: Secondary | ICD-10-CM | POA: Diagnosis not present

## 2017-03-22 DIAGNOSIS — J449 Chronic obstructive pulmonary disease, unspecified: Secondary | ICD-10-CM | POA: Diagnosis not present

## 2017-03-22 DIAGNOSIS — Z791 Long term (current) use of non-steroidal anti-inflammatories (NSAID): Secondary | ICD-10-CM | POA: Diagnosis not present

## 2017-03-24 DIAGNOSIS — J449 Chronic obstructive pulmonary disease, unspecified: Secondary | ICD-10-CM | POA: Diagnosis not present

## 2017-03-26 DIAGNOSIS — Z7951 Long term (current) use of inhaled steroids: Secondary | ICD-10-CM | POA: Diagnosis not present

## 2017-03-26 DIAGNOSIS — Z86718 Personal history of other venous thrombosis and embolism: Secondary | ICD-10-CM | POA: Diagnosis not present

## 2017-03-26 DIAGNOSIS — C07 Malignant neoplasm of parotid gland: Secondary | ICD-10-CM | POA: Diagnosis not present

## 2017-03-26 DIAGNOSIS — J449 Chronic obstructive pulmonary disease, unspecified: Secondary | ICD-10-CM | POA: Diagnosis not present

## 2017-03-26 DIAGNOSIS — Z9981 Dependence on supplemental oxygen: Secondary | ICD-10-CM | POA: Diagnosis not present

## 2017-03-26 DIAGNOSIS — I129 Hypertensive chronic kidney disease with stage 1 through stage 4 chronic kidney disease, or unspecified chronic kidney disease: Secondary | ICD-10-CM | POA: Diagnosis not present

## 2017-03-26 DIAGNOSIS — D649 Anemia, unspecified: Secondary | ICD-10-CM | POA: Diagnosis not present

## 2017-03-26 DIAGNOSIS — Z791 Long term (current) use of non-steroidal anti-inflammatories (NSAID): Secondary | ICD-10-CM | POA: Diagnosis not present

## 2017-03-26 DIAGNOSIS — N183 Chronic kidney disease, stage 3 (moderate): Secondary | ICD-10-CM | POA: Diagnosis not present

## 2017-03-26 DIAGNOSIS — I482 Chronic atrial fibrillation: Secondary | ICD-10-CM | POA: Diagnosis not present

## 2017-03-26 DIAGNOSIS — Z4800 Encounter for change or removal of nonsurgical wound dressing: Secondary | ICD-10-CM | POA: Diagnosis not present

## 2017-03-26 DIAGNOSIS — L03115 Cellulitis of right lower limb: Secondary | ICD-10-CM | POA: Diagnosis not present

## 2017-03-29 ENCOUNTER — Ambulatory Visit: Payer: Medicare Other | Admitting: Family Medicine

## 2017-04-01 ENCOUNTER — Other Ambulatory Visit: Payer: Self-pay | Admitting: Family Medicine

## 2017-04-01 DIAGNOSIS — I482 Chronic atrial fibrillation: Secondary | ICD-10-CM | POA: Diagnosis not present

## 2017-04-01 DIAGNOSIS — Z4800 Encounter for change or removal of nonsurgical wound dressing: Secondary | ICD-10-CM | POA: Diagnosis not present

## 2017-04-01 DIAGNOSIS — Z9981 Dependence on supplemental oxygen: Secondary | ICD-10-CM | POA: Diagnosis not present

## 2017-04-01 DIAGNOSIS — Z7951 Long term (current) use of inhaled steroids: Secondary | ICD-10-CM | POA: Diagnosis not present

## 2017-04-01 DIAGNOSIS — Z791 Long term (current) use of non-steroidal anti-inflammatories (NSAID): Secondary | ICD-10-CM | POA: Diagnosis not present

## 2017-04-01 DIAGNOSIS — Z86718 Personal history of other venous thrombosis and embolism: Secondary | ICD-10-CM | POA: Diagnosis not present

## 2017-04-01 DIAGNOSIS — J449 Chronic obstructive pulmonary disease, unspecified: Secondary | ICD-10-CM | POA: Diagnosis not present

## 2017-04-01 DIAGNOSIS — I129 Hypertensive chronic kidney disease with stage 1 through stage 4 chronic kidney disease, or unspecified chronic kidney disease: Secondary | ICD-10-CM | POA: Diagnosis not present

## 2017-04-01 DIAGNOSIS — C07 Malignant neoplasm of parotid gland: Secondary | ICD-10-CM | POA: Diagnosis not present

## 2017-04-01 DIAGNOSIS — N183 Chronic kidney disease, stage 3 (moderate): Secondary | ICD-10-CM | POA: Diagnosis not present

## 2017-04-01 DIAGNOSIS — L03115 Cellulitis of right lower limb: Secondary | ICD-10-CM | POA: Diagnosis not present

## 2017-04-01 DIAGNOSIS — D649 Anemia, unspecified: Secondary | ICD-10-CM | POA: Diagnosis not present

## 2017-04-09 DIAGNOSIS — D649 Anemia, unspecified: Secondary | ICD-10-CM | POA: Diagnosis not present

## 2017-04-09 DIAGNOSIS — Z7951 Long term (current) use of inhaled steroids: Secondary | ICD-10-CM | POA: Diagnosis not present

## 2017-04-09 DIAGNOSIS — C07 Malignant neoplasm of parotid gland: Secondary | ICD-10-CM | POA: Diagnosis not present

## 2017-04-09 DIAGNOSIS — Z4800 Encounter for change or removal of nonsurgical wound dressing: Secondary | ICD-10-CM | POA: Diagnosis not present

## 2017-04-09 DIAGNOSIS — Z9981 Dependence on supplemental oxygen: Secondary | ICD-10-CM | POA: Diagnosis not present

## 2017-04-09 DIAGNOSIS — Z791 Long term (current) use of non-steroidal anti-inflammatories (NSAID): Secondary | ICD-10-CM | POA: Diagnosis not present

## 2017-04-09 DIAGNOSIS — N183 Chronic kidney disease, stage 3 (moderate): Secondary | ICD-10-CM | POA: Diagnosis not present

## 2017-04-09 DIAGNOSIS — Z86718 Personal history of other venous thrombosis and embolism: Secondary | ICD-10-CM | POA: Diagnosis not present

## 2017-04-09 DIAGNOSIS — I482 Chronic atrial fibrillation: Secondary | ICD-10-CM | POA: Diagnosis not present

## 2017-04-09 DIAGNOSIS — I129 Hypertensive chronic kidney disease with stage 1 through stage 4 chronic kidney disease, or unspecified chronic kidney disease: Secondary | ICD-10-CM | POA: Diagnosis not present

## 2017-04-09 DIAGNOSIS — L03115 Cellulitis of right lower limb: Secondary | ICD-10-CM | POA: Diagnosis not present

## 2017-04-09 DIAGNOSIS — J449 Chronic obstructive pulmonary disease, unspecified: Secondary | ICD-10-CM | POA: Diagnosis not present

## 2017-04-12 ENCOUNTER — Ambulatory Visit: Payer: Medicare Other | Admitting: Family Medicine

## 2017-04-13 IMAGING — DX DG CHEST 2V
2 series · 2 of 2 positions shown · non-contrast
Comparison: July 13, 2015

CLINICAL DATA: Shortness of breath with cough and congestion

EXAM:
CHEST  2 VIEW

[chest pa]
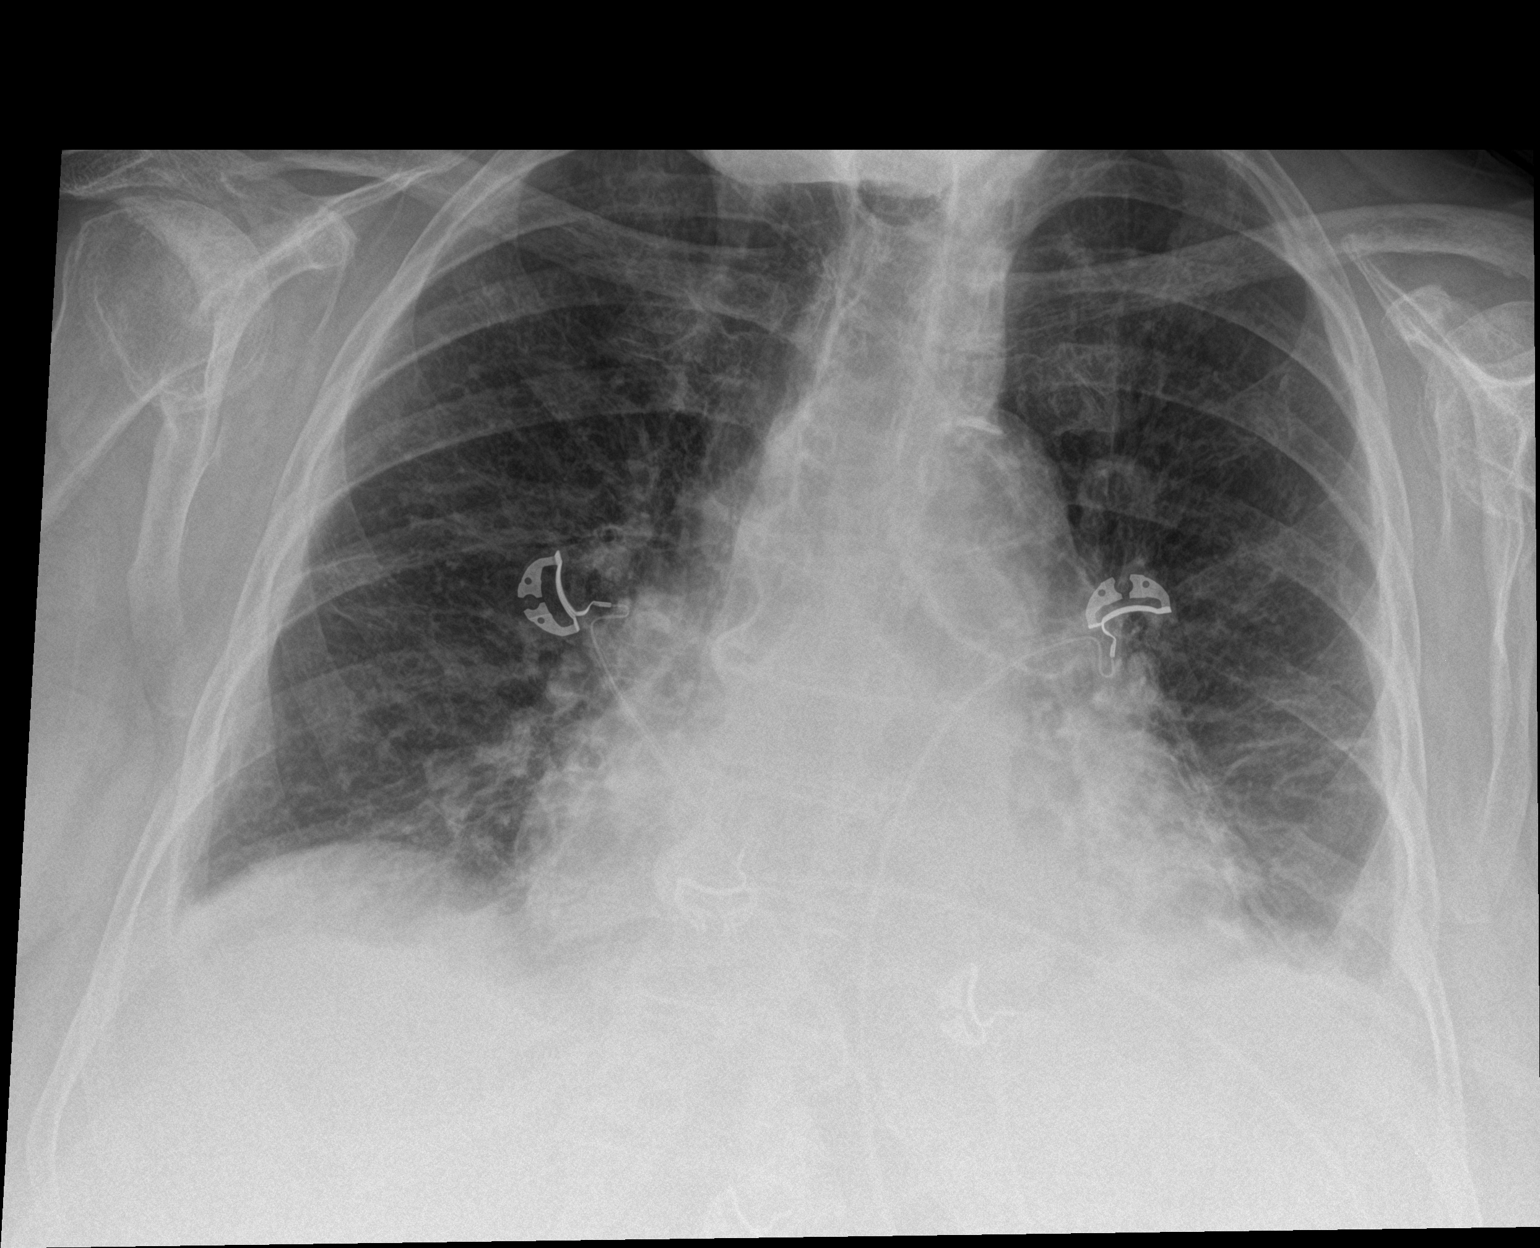

[chest lat]
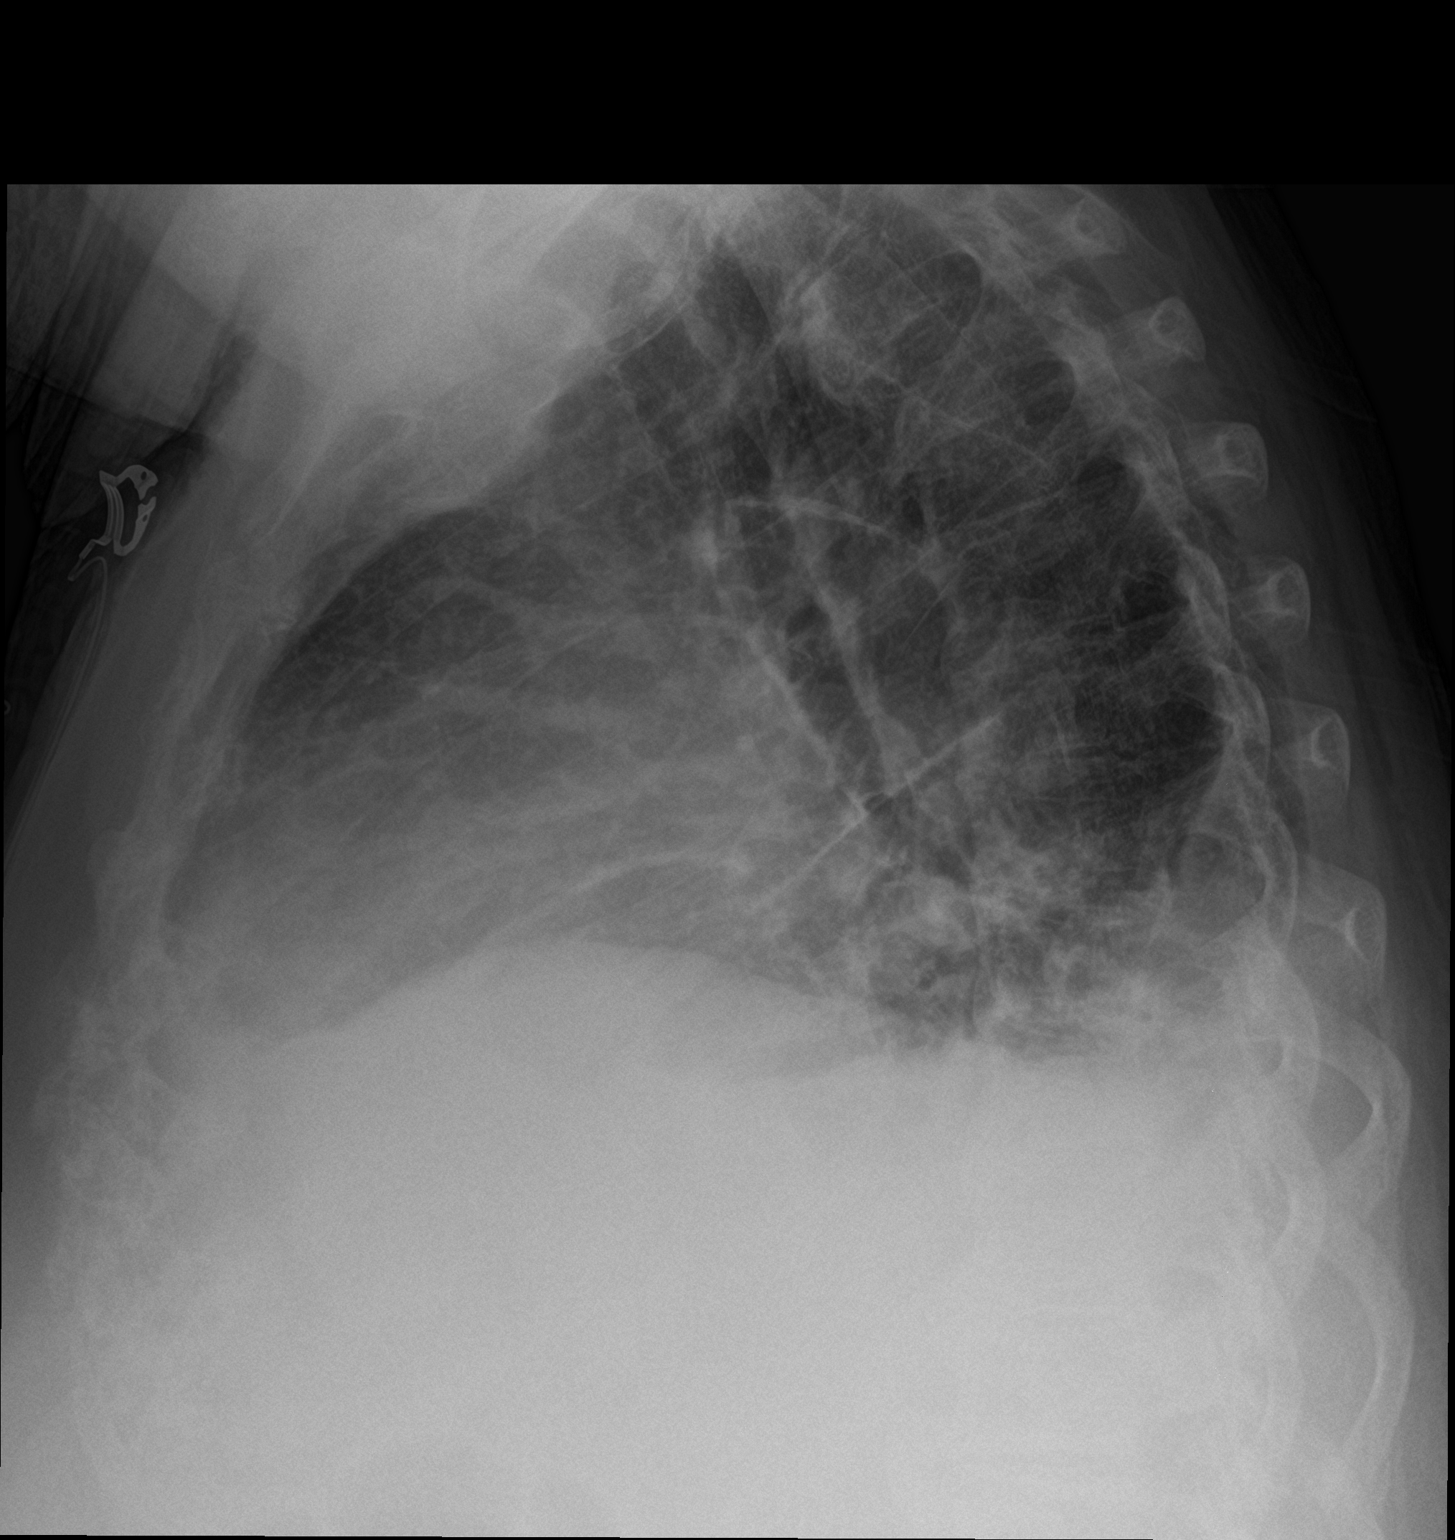

[2 of 2 positions shown; findings below may reference images not displayed]

FINDINGS: There is patchy airspace opacity in the posterior left base. There
is mild scarring in both lower lobe regions as well. Heart is
enlarged with pulmonary vascular within normal limits. No
adenopathy. No bone lesions. There is degenerative change in the
thoracic spine. Is atherosclerotic calcification in the aorta.
IMPRESSION: Patchy airspace opacity posterior left base, most likely pneumonia.
Mild scarring noted in both lower lobes. Mild cardiomegaly.
Atherosclerotic calcification in aorta.

Followup PA and lateral chest radiographs recommended in 3-4 weeks
following trial of antibiotic therapy to ensure resolution and
exclude underlying malignancy.

## 2017-04-15 ENCOUNTER — Other Ambulatory Visit: Payer: Self-pay | Admitting: Family Medicine

## 2017-04-17 ENCOUNTER — Other Ambulatory Visit: Payer: Self-pay | Admitting: Licensed Clinical Social Worker

## 2017-04-17 NOTE — Patient Outreach (Signed)
Assessment:  CSW spoke via phone with client. CSW verified client identity. CSW received verbal permission from client  on 04/17/17 for CSW to communicate with client about client needs and status.Client sees Dr. Livia Snellen as primary care doctor. Client said she had prescribed medications and is taking medications as prescribed. Client uses a walker to assist her in ambulation. She is trying to attend all scheduled client medical appointments. CSW and client spoke of client care plan. CSW encouraged client to communicate with CSW in next 30 days to discuss community resources of assistance for client .  Client has a lift chair in the home and uses lift chair as needed. Client uses oxygen as prescribed at night to help client with breathing. Client said she receives transport assistance as needed from family members to transport client to and from client's medical appointments. Client wears a Life Alert necklace and uses Life Alert necklace as needed for emergency assistance for client. Yehuda Savannah, client's daughter, lives nearby and checks in on client regularly. CSW has previously provided client with name, address and phone number of [employer] (for food assistance for client). Client had appointment with Dr. Livia Snellen on 03/15/17 related to right foot wound issue for client. Client is receiving home health nursing services for wound care to right foot wound of client.  CSW had informed client and her daughter Jazmine Heckman, of [facility] program support in areas of nursing, social work and pharmacy. CSW has provided client and daughter, Vaughan Basta, with CSW number of [phone]. CSW has encouraged client and Margert Edsall to call CSW as needed to discuss social work needs of client.    Plan:  Client to communicate with CSW in next 30 days to discuss community resources of assistance for client.  CSW to call client or Laniyah Rosenwald, daughter of client in 4 weeks to assess client needs at  that time.  [name].Joshua Soulier MSW, LCSW Licensed Clinical Social Worker [facility] Care Management [phone]

## 2017-04-24 DIAGNOSIS — J449 Chronic obstructive pulmonary disease, unspecified: Secondary | ICD-10-CM | POA: Diagnosis not present

## 2017-04-30 ENCOUNTER — Other Ambulatory Visit: Payer: Self-pay | Admitting: Family Medicine

## 2017-05-02 DIAGNOSIS — H353123 Nonexudative age-related macular degeneration, left eye, advanced atrophic without subfoveal involvement: Secondary | ICD-10-CM | POA: Diagnosis not present

## 2017-05-02 DIAGNOSIS — H43812 Vitreous degeneration, left eye: Secondary | ICD-10-CM | POA: Diagnosis not present

## 2017-05-02 DIAGNOSIS — H353113 Nonexudative age-related macular degeneration, right eye, advanced atrophic without subfoveal involvement: Secondary | ICD-10-CM | POA: Diagnosis not present

## 2017-05-02 DIAGNOSIS — H43811 Vitreous degeneration, right eye: Secondary | ICD-10-CM | POA: Diagnosis not present

## 2017-05-02 DIAGNOSIS — H353211 Exudative age-related macular degeneration, right eye, with active choroidal neovascularization: Secondary | ICD-10-CM | POA: Diagnosis not present

## 2017-05-20 ENCOUNTER — Other Ambulatory Visit: Payer: Self-pay | Admitting: Family Medicine

## 2017-05-20 ENCOUNTER — Ambulatory Visit (INDEPENDENT_AMBULATORY_CARE_PROVIDER_SITE_OTHER): Payer: Medicare Other | Admitting: Otolaryngology

## 2017-05-20 ENCOUNTER — Ambulatory Visit: Payer: Self-pay | Admitting: Licensed Clinical Social Worker

## 2017-05-20 DIAGNOSIS — C089 Malignant neoplasm of major salivary gland, unspecified: Secondary | ICD-10-CM | POA: Diagnosis not present

## 2017-05-22 ENCOUNTER — Other Ambulatory Visit: Payer: Self-pay | Admitting: Licensed Clinical Social Worker

## 2017-05-22 NOTE — Patient Outreach (Signed)
Assessment:   CSW spoke via phone with client. CSW verified client identity. CSW and client spoke of client needs. Client sees Dr. Livia Snellen as primary care doctor. Client said she had her prescribed medications and was taking medications as prescribed. Client uses a walker to assist her in ambulation. She said she is trying to attend all scheduled client medical appointments. Client and CSW spoke of client care plan. CSW encouraged client to communicate with CSW in next 30 days to discuss community resources of assistance for client. Client has a lift chair in the home and utilizes lift chair as needed. Client uses oxygen at night as prescribed to assist client with breathing. Client said that her family members transport client to and from client's scheduled medical appointments.Client wears a Life Alert necklace and uses Life Alert necklace as needed for emergency assistance for client. Client also has support from her daughter, Skyley Grandmaison, who lives nearby. Client had been receiving home health nursing services for wound care for wound on client's right foot. Client said her right foot wound is now healed. She said home health nursing services are now finished with client.  She said she has eye appointment with Dr. Zadie Rhine in [place], [place] on 06/21/17.  She said that her walker is helpful to her in helping her to ambulate.  Client likes going shopping with friends in the community. CSW thanked client for phone call with CSW on 05/22/17. CSW encouraged client to call CSW at [phone] as needed to discuss social work needs of client. Client was appreciative of phone call from [facility] on 05/22/17.     Plan:  Client to communicate with CSW in next 30 days to discuss community resources of assistance for client.    CSW to call client in 4 weeks to assess client needs at that time.  [name].Jamielynn Wigley MSW, LCSW Licensed Clinical Social Worker [facility] Care Management [phone]

## 2017-05-24 ENCOUNTER — Ambulatory Visit (INDEPENDENT_AMBULATORY_CARE_PROVIDER_SITE_OTHER): Payer: Medicare Other | Admitting: Family Medicine

## 2017-05-24 ENCOUNTER — Ambulatory Visit: Payer: Medicare Other | Admitting: Family Medicine

## 2017-05-24 DIAGNOSIS — J449 Chronic obstructive pulmonary disease, unspecified: Secondary | ICD-10-CM | POA: Diagnosis not present

## 2017-05-24 DIAGNOSIS — L03115 Cellulitis of right lower limb: Secondary | ICD-10-CM | POA: Diagnosis not present

## 2017-05-24 DIAGNOSIS — I482 Chronic atrial fibrillation: Secondary | ICD-10-CM

## 2017-05-24 DIAGNOSIS — I129 Hypertensive chronic kidney disease with stage 1 through stage 4 chronic kidney disease, or unspecified chronic kidney disease: Secondary | ICD-10-CM

## 2017-05-25 DIAGNOSIS — J449 Chronic obstructive pulmonary disease, unspecified: Secondary | ICD-10-CM | POA: Diagnosis not present

## 2017-05-30 ENCOUNTER — Other Ambulatory Visit: Payer: Self-pay | Admitting: Family Medicine

## 2017-06-10 ENCOUNTER — Other Ambulatory Visit: Payer: Self-pay | Admitting: Family Medicine

## 2017-06-10 MED ORDER — LEVOCETIRIZINE DIHYDROCHLORIDE 5 MG PO TABS: 5.0000 mg | ORAL_TABLET | Freq: Every day | ORAL | 3 refills | Status: DC

## 2017-06-13 DIAGNOSIS — H353113 Nonexudative age-related macular degeneration, right eye, advanced atrophic without subfoveal involvement: Secondary | ICD-10-CM | POA: Diagnosis not present

## 2017-06-13 DIAGNOSIS — H43811 Vitreous degeneration, right eye: Secondary | ICD-10-CM | POA: Diagnosis not present

## 2017-06-13 DIAGNOSIS — H353211 Exudative age-related macular degeneration, right eye, with active choroidal neovascularization: Secondary | ICD-10-CM | POA: Diagnosis not present

## 2017-06-13 DIAGNOSIS — H353123 Nonexudative age-related macular degeneration, left eye, advanced atrophic without subfoveal involvement: Secondary | ICD-10-CM | POA: Diagnosis not present

## 2017-06-18 ENCOUNTER — Other Ambulatory Visit: Payer: Self-pay | Admitting: Family Medicine

## 2017-06-24 ENCOUNTER — Encounter: Payer: Self-pay | Admitting: Licensed Clinical Social Worker

## 2017-06-24 ENCOUNTER — Other Ambulatory Visit: Payer: Self-pay | Admitting: Licensed Clinical Social Worker

## 2017-06-24 DIAGNOSIS — J449 Chronic obstructive pulmonary disease, unspecified: Secondary | ICD-10-CM | POA: Diagnosis not present

## 2017-06-24 NOTE — Patient Outreach (Signed)
Assessment:  CSW spoke via phone with client. CSW verified client identity. CSW and client spoke of client needs. Client sees Dr. Claretta Walker as primary care doctor. Client said she had her prescribed medications and is taking medications as prescribed. Client said she is trying to attend all scheduled client medical appointments. Client uses a walker to assist her in ambulation. CSW informed client on 06/24/17 that client had met her care plan goal with [facility] CSW services. Thus, since client has met her CSW care plan goal, CSW informed client on 06/24/17 that [name] Walker would discharge client form [facility] CSW services on 06/24/17. Client agreed to this plan. CSW encouraged client to attend her scheduled medical appointments with Dr. Livia Walker.  CSW congratulated client on meeting her [place] care plan goals with [facility] program.  Client was very appreciative of help from [name] Walker in recent months.      Plan:  CSW is discharging [name] Walker on 06/24/17 from [place] services since client has met her CSW care plan goals.  CSW to inform [name] Walker, Case Management Assistant, that Center discharged client on 06/24/17 from [place] services.  CSW to fax physician case closure letter to Dr. Livia Walker informing Dr. Livia Walker that [facility] discharged client from [facility] CSW services on 06/24/17.  [name] Walker.[name] Walker MSW, LCSW Licensed Clinical Social Worker [facility] Care Management [phone]

## 2017-06-25 ENCOUNTER — Other Ambulatory Visit: Payer: Self-pay | Admitting: Family Medicine

## 2017-07-12 ENCOUNTER — Other Ambulatory Visit: Payer: Self-pay

## 2017-07-12 MED ORDER — APIXABAN 5 MG PO TABS: 5.0000 mg | ORAL_TABLET | Freq: Two times a day (BID) | ORAL | 2 refills | Status: DC

## 2017-07-16 ENCOUNTER — Other Ambulatory Visit: Payer: Self-pay | Admitting: Family Medicine

## 2017-07-25 DIAGNOSIS — J449 Chronic obstructive pulmonary disease, unspecified: Secondary | ICD-10-CM | POA: Diagnosis not present

## 2017-07-29 ENCOUNTER — Other Ambulatory Visit: Payer: Self-pay | Admitting: Family Medicine

## 2017-08-05 ENCOUNTER — Other Ambulatory Visit: Payer: Self-pay | Admitting: Family Medicine

## 2017-08-06 ENCOUNTER — Other Ambulatory Visit: Payer: Self-pay | Admitting: Family Medicine

## 2017-08-06 NOTE — Telephone Encounter (Signed)
Last seen 03/15/17  Dr Livia Snellen

## 2017-08-07 ENCOUNTER — Other Ambulatory Visit: Payer: Self-pay | Admitting: Family Medicine

## 2017-08-19 ENCOUNTER — Other Ambulatory Visit: Payer: Self-pay | Admitting: Family Medicine

## 2017-08-24 DIAGNOSIS — J449 Chronic obstructive pulmonary disease, unspecified: Secondary | ICD-10-CM | POA: Diagnosis not present

## 2017-09-09 ENCOUNTER — Other Ambulatory Visit: Payer: Self-pay | Admitting: Family Medicine

## 2017-09-10 NOTE — Telephone Encounter (Signed)
Last seen 03/15/17  Dr Livia Snellen

## 2017-09-12 DIAGNOSIS — H353123 Nonexudative age-related macular degeneration, left eye, advanced atrophic without subfoveal involvement: Secondary | ICD-10-CM | POA: Diagnosis not present

## 2017-09-12 DIAGNOSIS — H353113 Nonexudative age-related macular degeneration, right eye, advanced atrophic without subfoveal involvement: Secondary | ICD-10-CM | POA: Diagnosis not present

## 2017-09-12 DIAGNOSIS — H353211 Exudative age-related macular degeneration, right eye, with active choroidal neovascularization: Secondary | ICD-10-CM | POA: Diagnosis not present

## 2017-09-12 DIAGNOSIS — H43811 Vitreous degeneration, right eye: Secondary | ICD-10-CM | POA: Diagnosis not present

## 2017-09-16 ENCOUNTER — Other Ambulatory Visit: Payer: Self-pay | Admitting: Family Medicine

## 2017-09-18 NOTE — Telephone Encounter (Signed)
Last seen 03/15/17   Dr Livia Snellen

## 2017-09-18 NOTE — Telephone Encounter (Signed)
Authorize 30 days only. Then contact the patient letting them know that they will need an appointment before any further prescriptions can be sent in. 

## 2017-09-20 DIAGNOSIS — Z923 Personal history of irradiation: Secondary | ICD-10-CM | POA: Diagnosis not present

## 2017-09-20 DIAGNOSIS — Z08 Encounter for follow-up examination after completed treatment for malignant neoplasm: Secondary | ICD-10-CM | POA: Diagnosis not present

## 2017-09-20 DIAGNOSIS — Z85818 Personal history of malignant neoplasm of other sites of lip, oral cavity, and pharynx: Secondary | ICD-10-CM | POA: Diagnosis not present

## 2017-09-20 DIAGNOSIS — C07 Malignant neoplasm of parotid gland: Secondary | ICD-10-CM | POA: Diagnosis not present

## 2017-09-23 ENCOUNTER — Other Ambulatory Visit: Payer: Self-pay | Admitting: Family Medicine

## 2017-09-24 DIAGNOSIS — J449 Chronic obstructive pulmonary disease, unspecified: Secondary | ICD-10-CM | POA: Diagnosis not present

## 2017-09-25 NOTE — Telephone Encounter (Signed)
Authorize 30 days only. Then contact the patient letting them know that they will need an appointment before any further prescriptions can be sent in. 

## 2017-09-25 NOTE — Telephone Encounter (Signed)
Last seen 03/15/17  Dr Livia Snellen

## 2017-10-04 ENCOUNTER — Other Ambulatory Visit: Payer: Self-pay | Admitting: Family Medicine

## 2017-10-09 ENCOUNTER — Other Ambulatory Visit: Payer: Self-pay | Admitting: Family Medicine

## 2017-10-09 ENCOUNTER — Ambulatory Visit: Payer: Medicare Other | Admitting: Family Medicine

## 2017-10-09 NOTE — Telephone Encounter (Signed)
Ov 10/17/17

## 2017-10-14 ENCOUNTER — Other Ambulatory Visit: Payer: Self-pay | Admitting: Family Medicine

## 2017-10-15 ENCOUNTER — Encounter: Payer: Self-pay | Admitting: Family Medicine

## 2017-10-15 ENCOUNTER — Other Ambulatory Visit: Payer: Self-pay | Admitting: Family Medicine

## 2017-10-15 NOTE — Telephone Encounter (Signed)
Authorize 30 days only. Then contact the patient letting them know that they will need an appointment before any further prescriptions can be sent in. 

## 2017-10-15 NOTE — Telephone Encounter (Signed)
OV 10/17/17 

## 2017-10-15 NOTE — Telephone Encounter (Signed)
Last seen 03/15/17  Dr Livia Snellen

## 2017-10-17 ENCOUNTER — Ambulatory Visit: Payer: Medicare Other | Admitting: Family Medicine

## 2017-10-17 ENCOUNTER — Other Ambulatory Visit: Payer: Self-pay | Admitting: Family Medicine

## 2017-10-25 DIAGNOSIS — J449 Chronic obstructive pulmonary disease, unspecified: Secondary | ICD-10-CM | POA: Diagnosis not present

## 2017-10-30 ENCOUNTER — Ambulatory Visit: Payer: Medicare Other | Admitting: Family Medicine

## 2017-11-05 ENCOUNTER — Other Ambulatory Visit: Payer: Self-pay | Admitting: Family Medicine

## 2017-11-11 ENCOUNTER — Other Ambulatory Visit: Payer: Self-pay | Admitting: Family Medicine

## 2017-11-11 ENCOUNTER — Ambulatory Visit (INDEPENDENT_AMBULATORY_CARE_PROVIDER_SITE_OTHER): Payer: Medicare Other | Admitting: Otolaryngology

## 2017-11-11 NOTE — Telephone Encounter (Signed)
Last seen 03/15/17  Dr Livia Snellen

## 2017-11-13 ENCOUNTER — Ambulatory Visit: Payer: Medicare Other | Admitting: Family Medicine

## 2017-11-19 ENCOUNTER — Other Ambulatory Visit: Payer: Self-pay | Admitting: Family Medicine

## 2017-11-19 NOTE — Telephone Encounter (Signed)
OV 11/20/17

## 2017-11-20 ENCOUNTER — Other Ambulatory Visit: Payer: Self-pay | Admitting: Family Medicine

## 2017-11-20 ENCOUNTER — Ambulatory Visit: Payer: Medicare Other | Admitting: Family Medicine

## 2017-11-20 NOTE — Telephone Encounter (Signed)
Next OV 11/26/17

## 2017-11-22 DIAGNOSIS — J449 Chronic obstructive pulmonary disease, unspecified: Secondary | ICD-10-CM | POA: Diagnosis not present

## 2017-11-25 ENCOUNTER — Other Ambulatory Visit: Payer: Self-pay | Admitting: Family Medicine

## 2017-11-26 ENCOUNTER — Ambulatory Visit: Payer: Medicare Other | Admitting: Family Medicine

## 2017-11-26 NOTE — Telephone Encounter (Signed)
Last seen 03/15/17  Dr Livia Snellen

## 2017-11-27 ENCOUNTER — Encounter: Payer: Self-pay | Admitting: Family Medicine

## 2017-12-02 ENCOUNTER — Encounter: Payer: Self-pay | Admitting: Family Medicine

## 2017-12-02 ENCOUNTER — Ambulatory Visit (INDEPENDENT_AMBULATORY_CARE_PROVIDER_SITE_OTHER): Payer: Medicare Other | Admitting: Family Medicine

## 2017-12-02 VITALS — BP 119/60 | HR 68 | Ht <= 58 in | Wt 178.0 lb

## 2017-12-02 DIAGNOSIS — I482 Chronic atrial fibrillation, unspecified: Secondary | ICD-10-CM

## 2017-12-02 DIAGNOSIS — J439 Emphysema, unspecified: Secondary | ICD-10-CM

## 2017-12-02 DIAGNOSIS — I10 Essential (primary) hypertension: Secondary | ICD-10-CM

## 2017-12-02 DIAGNOSIS — M150 Primary generalized (osteo)arthritis: Secondary | ICD-10-CM | POA: Diagnosis not present

## 2017-12-02 DIAGNOSIS — E538 Deficiency of other specified B group vitamins: Secondary | ICD-10-CM

## 2017-12-02 DIAGNOSIS — F339 Major depressive disorder, recurrent, unspecified: Secondary | ICD-10-CM

## 2017-12-02 DIAGNOSIS — M159 Polyosteoarthritis, unspecified: Secondary | ICD-10-CM

## 2017-12-02 NOTE — Progress Notes (Signed)
Subjective:  Patient ID: [name] Walker, female    DOB: [birth year]  Age: 84 y.o. MRN: [id number]  CC: Follow-up (pt here today for routine follow up)   HPI [name] Walker presents for Recheck of her COPD.  She says she is up taking care of her household and the only thing that holds her back is back pain not shortness of breath.  She is taking Lasix and says that makes her have to pee all the time.  So she did not take it today before coming in and that is why she is swollen while she is here.  She denies dyspnea.  She is not wearing her oxygen today she was at home intermittently.  Patient says that she can do for herself at home and she is upset that her daughter has taken her checkbook and will not give her any money unless she asks for it for specific purposes.  She does not like losing her independence.   Patient in for follow-up of atrial fibrillation. Patient denies any recent bouts of chest pain or palpitations. Additionally, patient is taking anticoagulants. Patient denies any recent excessive bleeding episodes including epistaxis, bleeding from the gums, genitalia, rectal bleeding or hematuria. Additionally there has been no excessive bruising.   Follow-up of hypertension. Patient has no history of headache chest pain or shortness of breath or recent cough. Patient also denies symptoms of TIA such as numbness weakness lateralizing. Patient checks  blood pressure at home and has not had any elevated readings recently. Patient denies side effects from his medication. States taking it regularly.   Depression screen [facility] 2/9 12/02/2017 03/15/2017 02/12/2017  Decreased Interest 0 0 0  Down, Depressed, Hopeless 0 0 0  PHQ - 2 Score 0 0 0  Altered sleeping - - -  Tired, decreased energy - - -  Change in appetite - - -  Feeling bad or failure about yourself  - - -  Trouble concentrating - - -  Moving slowly or fidgety/restless - - -  Suicidal thoughts - - -  PHQ-9 Score - - -    Difficult doing work/chores - - -  Some recent data might be hidden    History [name] Walker has a past medical history of Cataract, COPD (chronic obstructive pulmonary disease) ([place]), Depression, DVT (deep venous thrombosis) ([place]), Gastric erosion, GERD (gastroesophageal reflux disease), Hypertension, Insomnia, Ischemic colitis ([place]), Osteoarthritis, Parotid mass (2016), and 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   She has a past surgical history that includes Total hip arthroplasty; Knee Arthroplasty; Esophagogastroduodenoscopy (10/17/2007); Colonoscopy; Back surgery; Abdominal hysterectomy; Cholecystectomy; Cataracts; and Parotidectomy (Left, 05/17/2015).   Her family history includes Cancer in her sister; Rheum arthritis in her mother; Ulcers in her father and mother.She reports that  has never smoked. she has never used smokeless tobacco. She reports that she does not drink alcohol or use drugs.    ROS Review of Systems  Constitutional: Negative for activity change, appetite change and fever.  HENT: Negative for congestion, rhinorrhea and sore throat.   Eyes: Negative for visual disturbance.  Respiratory: Negative for cough and shortness of breath.   Cardiovascular: Negative for chest pain and palpitations.  Gastrointestinal: Negative for abdominal pain, diarrhea and nausea.  Genitourinary: Negative for dysuria.  Musculoskeletal: Positive for arthralgias (Patient requests a back brace and bilateral knee braces) and back pain. Negative for myalgias.  Psychiatric/Behavioral: Positive for sleep disturbance. Negative for confusion and dysphoric mood.    Objective:  BP 119/60   Pulse 68   Ht 4\' 9"  (1.448 m)   Wt 178 lb (80.7 kg)   BMI 38.52 kg/m    BP Readings from Last 3 Encounters:  12/02/17 119/60  03/15/17 110/67  03/11/17 119/89    Wt Readings from Last 3 Encounters:  12/02/17 178 lb (80.7 kg)  03/15/17 176 lb (79.8 kg)  03/08/17 173 lb 1.6 oz  (78.5 kg)     Physical Exam  Constitutional: She appears well-developed and well-nourished. No distress.  HENT:  Head: Normocephalic and atraumatic.  Right Ear: External ear normal.  Left Ear: External ear normal.  Nose: Nose normal.  Mouth/Throat: Oropharynx is clear and moist.  Eyes: Conjunctivae and EOM are normal. Pupils are equal, round, and reactive to light.  Neck: Normal range of motion. Neck supple. No thyromegaly present.  Cardiovascular: Normal rate, regular rhythm and normal heart sounds.  No murmur heard. Pulmonary/Chest: Effort normal and breath sounds normal. No respiratory distress. She has no wheezes. She has no rales.  Abdominal: Soft. Bowel sounds are normal. She exhibits no distension. There is no tenderness.  Musculoskeletal: She exhibits edema (2+ pretibial).  Patient is in a wheelchair today.  Claims she gets up and does her own housework.  This is verified by her daughter who is with her here today.  Lymphadenopathy:    She has no cervical adenopathy.  Neurological: She is alert. She has normal reflexes. Coordination abnormal.  Skin: Skin is warm and dry.  Psychiatric: Her affect is inappropriate. Her speech is rapid and/or pressured. She is agitated. Thought content is paranoid. Cognition and memory are impaired. She expresses impulsivity.      Assessment & Plan:   [name] Walker was seen today for follow-up.  Diagnoses and all orders for this visit:  Atrial fibrillation, chronic (HCC)  Pulmonary emphysema, unspecified emphysema type ([place])  B12 deficiency -     Vitamin B12  Primary osteoarthritis involving multiple joints -     VITAMIN D 25 Hydroxy (Vit-D Deficiency, Fractures)       I am having [name] Walker maintain her albuterol, calcium-vitamin D, Vitamin D-Vitamin K (VITAMIN K2-VITAMIN D3 PO), OXYGEN, ibuprofen, triamcinolone cream, hydrOXYzine, levocetirizine, donepezil, omeprazole, citalopram, verapamil, potassium chloride, ELIQUIS,  rOPINIRole, amitriptyline, furosemide, and gabapentin.  Allergies as of 12/02/2017      Reactions   Diltiazem Anxiety, Other (See Comments)   Sleep disturbance   Iohexol     Desc: THROAT SWELLING-NOTED IN CHART AFTER IVC PLACEMENT-ARS 12-02-05   Latex Other (See Comments)   unknown   Penicillins Swelling   Patient was given zosyn this admission and tolerated. After d/w MD and pt, she had swelling in arm only, so possible just infiltration and not allergic. ABx changed to Cefazolin and is tolerating   Shellfish Allergy Other (See Comments)   Reaction unknown      Medication List        Accurate as of 12/02/17  3:43 PM. Always use your most recent med list.          albuterol (2.5 MG/3ML) 0.083% nebulizer solution Commonly known as:  PROVENTIL Take 3 mLs (2.5 mg total) by nebulization every 2 (two) hours as needed for wheezing or shortness of breath.   amitriptyline 75 MG tablet Commonly known as:  ELAVIL TAKE 1 TABLET AT BEDTIME   calcium-vitamin D 500-200 MG-UNIT tablet Take 1 tablet by mouth every morning.   citalopram 20 MG tablet Commonly known as:  CELEXA TAKE 1 TABLET DAILY  donepezil 10 MG tablet Commonly known as:  ARICEPT TAKE ONE TABLET AT BEDTIME   ELIQUIS 5 MG Tabs tablet Generic drug:  apixaban Take 1 tablet (5 mg total) by mouth 2 (two) times daily.   furosemide 40 MG tablet Commonly known as:  LASIX TAKE (2) TABLETS DAILY   gabapentin 300 MG capsule Commonly known as:  NEURONTIN TAKE (1) CAPSULE TWICE DAILY.   hydrOXYzine 10 MG tablet Commonly known as:  ATARAX/VISTARIL TAKE (1) TABLET THREE TIMES DAILY.   ibuprofen 200 MG tablet Commonly known as:  ADVIL,MOTRIN Take 200 mg by mouth every 6 (six) hours as needed for moderate pain.   levocetirizine 5 MG tablet Commonly known as:  XYZAL Take 1 tablet (5 mg total) by mouth at bedtime.   omeprazole 20 MG capsule Commonly known as:  PRILOSEC TAKE 2 CAPSULES DAILY AS NEEDED FOR REFLUX     OXYGEN Inhale into the lungs at bedtime.   potassium chloride 10 MEQ tablet Commonly known as:  K-DUR TAKE 4 TABLETS ONCE DAILY   rOPINIRole 1 MG tablet Commonly known as:  REQUIP TAKE 1 OR 2 TABLETS AT BEDTIME   triamcinolone cream 0.1 % Commonly known as:  KENALOG   verapamil 180 MG CR tablet Commonly known as:  CALAN-SR TAKE ONE TABLET AT BEDTIME   VITAMIN K2-VITAMIN D3 PO Take 2 capsules by mouth every morning.        Follow-up: Return in about 6 months (around 06/04/2018).  Claretta Fraise, M.D.

## 2017-12-02 NOTE — Addendum Note (Signed)
Addended by: Marylin Crosby on: 12/02/2017 03:55 PM   Modules accepted: Orders

## 2017-12-03 ENCOUNTER — Other Ambulatory Visit: Payer: Self-pay

## 2017-12-03 DIAGNOSIS — E538 Deficiency of other specified B group vitamins: Secondary | ICD-10-CM

## 2017-12-03 LAB — CMP14+EGFR
ALBUMIN: 3.8 g/dL (ref 3.2–4.6)
ALT: 10 IU/L (ref 0–32)
AST: 19 IU/L (ref 0–40)
Albumin/Globulin Ratio: 1.5 (ref 1.2–2.2)
Alkaline Phosphatase: 117 IU/L (ref 39–117)
BUN / CREAT RATIO: 14 (ref 12–28)
BUN: 14 mg/dL (ref 10–36)
Bilirubin Total: <0.2 mg/dL (ref 0.0–1.2)
CALCIUM: 8.8 mg/dL (ref 8.7–10.3)
CO2: 24 mmol/L (ref 20–29)
Chloride: 105 mmol/L (ref 96–106)
Creatinine, Ser: 0.98 mg/dL (ref 0.57–1.00)
GFR, EST AFRICAN AMERICAN: 59 mL/min/{1.73_m2} — AB (ref >59)
GFR, EST NON AFRICAN AMERICAN: 51 mL/min/{1.73_m2} — AB (ref >59)
Globulin, Total: 2.6 g/dL (ref 1.5–4.5)
Glucose: 101 mg/dL — ABNORMAL HIGH (ref 65–99)
Potassium: 4.3 mmol/L (ref 3.5–5.2)
Sodium: 144 mmol/L (ref 134–144)
TOTAL PROTEIN: 6.4 g/dL (ref 6.0–8.5)

## 2017-12-03 LAB — CBC WITH DIFFERENTIAL/PLATELET
Basophils Absolute: 0 10*3/uL (ref 0.0–0.2)
Basos: 0 %
EOS (ABSOLUTE): 0.2 10*3/uL (ref 0.0–0.4)
EOS: 3 %
HEMATOCRIT: 33.1 % — AB (ref 34.0–46.6)
HEMOGLOBIN: 11 g/dL — AB (ref 11.1–15.9)
IMMATURE GRANS (ABS): 0 10*3/uL (ref 0.0–0.1)
IMMATURE GRANULOCYTES: 0 %
LYMPHS: 19 %
Lymphocytes Absolute: 1.2 10*3/uL (ref 0.7–3.1)
MCH: 31.6 pg (ref 26.6–33.0)
MCHC: 33.2 g/dL (ref 31.5–35.7)
MCV: 95 fL (ref 79–97)
MONOCYTES: 12 %
Monocytes Absolute: 0.7 10*3/uL (ref 0.1–0.9)
NEUTROS ABS: 3.9 10*3/uL (ref 1.4–7.0)
Neutrophils: 66 %
PLATELETS: 227 10*3/uL (ref 150–379)
RBC: 3.48 x10E6/uL — ABNORMAL LOW (ref 3.77–5.28)
RDW: 14.4 % (ref 12.3–15.4)
WBC: 6.1 10*3/uL (ref 3.4–10.8)

## 2017-12-03 LAB — TSH: TSH: 2.03 u[IU]/mL (ref 0.450–4.500)

## 2017-12-03 LAB — VITAMIN D 25 HYDROXY (VIT D DEFICIENCY, FRACTURES): Vit D, 25-Hydroxy: 43.7 ng/mL (ref 30.0–100.0)

## 2017-12-03 LAB — VITAMIN B12: Vitamin B-12: 227 pg/mL — ABNORMAL LOW (ref 232–1245)

## 2017-12-03 MED ORDER — CYANOCOBALAMIN 1000 MCG/ML IJ SOLN
1000.0000 ug | INTRAMUSCULAR | Status: AC
  Administered 2017-12-05 – 2018-04-24 (×5): 1000 ug via INTRAMUSCULAR

## 2017-12-04 ENCOUNTER — Ambulatory Visit: Payer: Medicare Other

## 2017-12-04 ENCOUNTER — Other Ambulatory Visit: Payer: Self-pay | Admitting: Family Medicine

## 2017-12-05 ENCOUNTER — Ambulatory Visit (INDEPENDENT_AMBULATORY_CARE_PROVIDER_SITE_OTHER): Payer: Medicare Other | Admitting: *Deleted

## 2017-12-05 DIAGNOSIS — E538 Deficiency of other specified B group vitamins: Secondary | ICD-10-CM | POA: Diagnosis not present

## 2017-12-05 NOTE — Progress Notes (Signed)
Pt given Cyanocobalamin inj Tolerated well 

## 2017-12-09 ENCOUNTER — Other Ambulatory Visit: Payer: Self-pay | Admitting: Family Medicine

## 2017-12-10 ENCOUNTER — Other Ambulatory Visit: Payer: Self-pay | Admitting: Family Medicine

## 2017-12-19 ENCOUNTER — Other Ambulatory Visit: Payer: Self-pay | Admitting: Family Medicine

## 2017-12-23 DIAGNOSIS — J449 Chronic obstructive pulmonary disease, unspecified: Secondary | ICD-10-CM | POA: Diagnosis not present

## 2017-12-30 ENCOUNTER — Other Ambulatory Visit: Payer: Self-pay | Admitting: Family Medicine

## 2018-01-02 DIAGNOSIS — H353113 Nonexudative age-related macular degeneration, right eye, advanced atrophic without subfoveal involvement: Secondary | ICD-10-CM | POA: Diagnosis not present

## 2018-01-02 DIAGNOSIS — H353212 Exudative age-related macular degeneration, right eye, with inactive choroidal neovascularization: Secondary | ICD-10-CM | POA: Diagnosis not present

## 2018-01-02 DIAGNOSIS — H43811 Vitreous degeneration, right eye: Secondary | ICD-10-CM | POA: Diagnosis not present

## 2018-01-02 DIAGNOSIS — H353123 Nonexudative age-related macular degeneration, left eye, advanced atrophic without subfoveal involvement: Secondary | ICD-10-CM | POA: Diagnosis not present

## 2018-01-08 ENCOUNTER — Ambulatory Visit (INDEPENDENT_AMBULATORY_CARE_PROVIDER_SITE_OTHER): Payer: Medicare Other

## 2018-01-08 DIAGNOSIS — E538 Deficiency of other specified B group vitamins: Secondary | ICD-10-CM

## 2018-01-13 ENCOUNTER — Other Ambulatory Visit: Payer: Self-pay | Admitting: Family Medicine

## 2018-01-14 ENCOUNTER — Other Ambulatory Visit: Payer: Self-pay | Admitting: Family Medicine

## 2018-01-20 ENCOUNTER — Other Ambulatory Visit: Payer: Self-pay | Admitting: Family Medicine

## 2018-01-22 DIAGNOSIS — J449 Chronic obstructive pulmonary disease, unspecified: Secondary | ICD-10-CM | POA: Diagnosis not present

## 2018-01-30 ENCOUNTER — Ambulatory Visit (INDEPENDENT_AMBULATORY_CARE_PROVIDER_SITE_OTHER): Payer: Medicare Other | Admitting: Otolaryngology

## 2018-02-14 ENCOUNTER — Ambulatory Visit (INDEPENDENT_AMBULATORY_CARE_PROVIDER_SITE_OTHER): Payer: Medicare Other | Admitting: *Deleted

## 2018-02-14 DIAGNOSIS — E538 Deficiency of other specified B group vitamins: Secondary | ICD-10-CM

## 2018-02-14 NOTE — Progress Notes (Signed)
Vitamin b12 injection given and patient tolerated well.  

## 2018-02-14 NOTE — Patient Instructions (Signed)
Vitamin B12 Deficiency Vitamin B12 deficiency means that your body is not getting enough vitamin B12. Your body needs vitamin B12 for important bodily functions. If you do not have enough vitamin B12 in your body, you can have health problems. Follow these instructions at home:  Take supplements only as told by your doctor. Follow the directions carefully.  Get any shots (injections) as told by your doctor. Do not miss your visits to the doctor.  Eat lots of healthy foods that contain vitamin B12. Ask your doctor if you should work with someone who is trained in how food affects health (dietitian). Foods that contain vitamin B12 include: ? Meat. ? Meat from birds (poultry). ? Fish. ? Eggs. ? Cereal and dairy products that are fortified. This means that vitamin B12 has been added to the food. Check the label on the package to see if the food is fortified.  Do not drink too much (do not abuse) alcohol.  Keep all follow-up visits as told by your doctor. This is important. Contact a doctor if:  Your symptoms come back. Get help right away if:  You have trouble breathing.  You have chest pain.  You get dizzy.  You pass out (lose consciousness). This information is not intended to replace advice given to you by your health care provider. Make sure you discuss any questions you have with your health care provider. Document Released: 08/30/2011 Document Revised: 02/16/2016 Document Reviewed: 01/26/2015 Elsevier Interactive Patient Education  2018 [employer]  

## 2018-02-22 DIAGNOSIS — J449 Chronic obstructive pulmonary disease, unspecified: Secondary | ICD-10-CM | POA: Diagnosis not present

## 2018-02-24 ENCOUNTER — Other Ambulatory Visit: Payer: Self-pay

## 2018-02-25 ENCOUNTER — Other Ambulatory Visit: Payer: Self-pay | Admitting: Family Medicine

## 2018-02-27 DIAGNOSIS — B372 Candidiasis of skin and nail: Secondary | ICD-10-CM | POA: Diagnosis not present

## 2018-02-27 DIAGNOSIS — L03115 Cellulitis of right lower limb: Secondary | ICD-10-CM | POA: Diagnosis not present

## 2018-02-27 DIAGNOSIS — W19XXXA Unspecified fall, initial encounter: Secondary | ICD-10-CM | POA: Diagnosis not present

## 2018-02-27 DIAGNOSIS — R58 Hemorrhage, not elsewhere classified: Secondary | ICD-10-CM | POA: Diagnosis not present

## 2018-02-27 DIAGNOSIS — R600 Localized edema: Secondary | ICD-10-CM | POA: Diagnosis not present

## 2018-03-03 ENCOUNTER — Ambulatory Visit (INDEPENDENT_AMBULATORY_CARE_PROVIDER_SITE_OTHER): Payer: Medicare Other | Admitting: Otolaryngology

## 2018-03-03 ENCOUNTER — Ambulatory Visit (INDEPENDENT_AMBULATORY_CARE_PROVIDER_SITE_OTHER): Payer: Medicare Other | Admitting: Family Medicine

## 2018-03-03 ENCOUNTER — Encounter: Payer: Self-pay | Admitting: Family Medicine

## 2018-03-03 VITALS — BP 134/58 | HR 62 | Temp 97.5°F | Ht <= 58 in | Wt 178.1 lb

## 2018-03-03 DIAGNOSIS — R296 Repeated falls: Secondary | ICD-10-CM

## 2018-03-03 DIAGNOSIS — I482 Chronic atrial fibrillation, unspecified: Secondary | ICD-10-CM

## 2018-03-03 DIAGNOSIS — R531 Weakness: Secondary | ICD-10-CM

## 2018-03-03 DIAGNOSIS — J449 Chronic obstructive pulmonary disease, unspecified: Secondary | ICD-10-CM | POA: Diagnosis not present

## 2018-03-03 DIAGNOSIS — R2689 Other abnormalities of gait and mobility: Secondary | ICD-10-CM | POA: Diagnosis not present

## 2018-03-03 DIAGNOSIS — M150 Primary generalized (osteo)arthritis: Secondary | ICD-10-CM | POA: Diagnosis not present

## 2018-03-03 DIAGNOSIS — E538 Deficiency of other specified B group vitamins: Secondary | ICD-10-CM | POA: Diagnosis not present

## 2018-03-03 DIAGNOSIS — M159 Polyosteoarthritis, unspecified: Secondary | ICD-10-CM | POA: Insufficient documentation

## 2018-03-03 NOTE — Progress Notes (Addendum)
Subjective:  Patient ID: [name] Walker, female    DOB: [birth year]  Age: 84 y.o. MRN: [id number]  CC: Face to Face (pt here today for a face to face to get Hoveround)   HPI [name] Walker presents for Follow-up on her osteoarthritis.  She is getting weaker and weaker.  She has a great deal of difficulty ambulating.  Weakness has been complicating the pain of the arthritis.  She cannot ambulate effectively to perform her ADLs.  This interferes with toileting, bathing, grooming and meal prep.   Pain has been in the hips and knees.  However she is have a history of spondylosis and cervical fracture.  Chronic pain in those areas limits her as well.  Additionally she has been weakened by her cancer survival status and treatment for her left parotid squamous carcinoma.  Additionally her COPD and atrial fibrillation tend to weaken her.  She has no strength in her shoulders to support her for a cane, a walker or a manual wheelchair.  She is very prone to falling.  She can not walk over 2 steps with a cane without being at risk for falling. She is currently using a walker for ambulation and has been falling more frequently in spite of this.  A few days ago she fell trying to get to the commode.  She was using her walker at the time.  She reports multiple falls but twice in the last week have led to extensive bruising.  Patient points out bruising at the left upper abdomen and on the left breast as well as on the right breast.  She says that her left face was swollen and red a week ago because of a fall where she hit her head.  She landed on her knees and now has a crusted lesion from a scrape to the right knee at the level of the kneecap as well as pain from the left knee.  Patient reports also a decreased range of motion in the shoulders.  Due to all of this she is in today for evaluation for a motorized wheelchair. She needs it for use in the home. She is unsteady and at risk for falling with use of a  power scooter.   Patient in for follow-up of atrial fibrillation. Patient denies any recent bouts of chest pain or palpitations. Additionally, patient is taking anticoagulants. Patient denies any recent excessive bleeding episodes including epistaxis, bleeding from the gums, genitalia, rectal bleeding or hematuria. Additionally there has been no excessive bruising.  COPD continues to require regular use of oxygen.  As long as she wears the oxygen she is not short of breath.  This is not the limiting factor for her but in combination with atrial fibrillation and arthritis it has weakened her and prevents her from ambulating throughout her home. Depression screen [employer] 2/9 03/03/2018 12/02/2017 03/15/2017  Decreased Interest 0 0 0  Down, Depressed, Hopeless 0 0 0  PHQ - 2 Score 0 0 0  Altered sleeping - - -  Tired, decreased energy - - -  Change in appetite - - -  Feeling bad or failure about yourself  - - -  Trouble concentrating - - -  Moving slowly or fidgety/restless - - -  Suicidal thoughts - - -  PHQ-9 Score - - -  Difficult doing work/chores - - -  Some recent data might be hidden    History [name] Walker has a past medical history of Cataract, COPD (chronic obstructive pulmonary  disease) ([place]), Depression, DVT (deep venous thrombosis) ([facility]), Gastric erosion, GERD (gastroesophageal reflux disease), Hypertension, Insomnia, Ischemic colitis ([place]), Osteoarthritis, Parotid mass (2016), and 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   She has a past surgical history that includes Total hip arthroplasty; Knee Arthroplasty; Esophagogastroduodenoscopy (10/17/2007); Colonoscopy; Back surgery; Abdominal hysterectomy; Cholecystectomy; Cataracts; and Parotidectomy (Left, 05/17/2015).   Her family history includes Cancer in her sister; Rheum arthritis in her mother; Ulcers in her father and mother.She reports that she has never smoked. She has never used smokeless tobacco. She reports  that she does not drink alcohol or use drugs.    ROS Review of Systems  Constitutional: Negative.   HENT: Negative for congestion.   Eyes: Negative for visual disturbance.  Respiratory: Positive for shortness of breath.   Cardiovascular: Positive for leg swelling. Negative for chest pain and palpitations.  Gastrointestinal: Negative for abdominal pain, constipation, diarrhea, nausea and vomiting.  Genitourinary: Negative for difficulty urinating.  Musculoskeletal: Positive for arthralgias, back pain, gait problem, joint swelling and myalgias.  Neurological: Negative for headaches.  Psychiatric/Behavioral: Negative for sleep disturbance.    Objective:  BP (!) 134/58   Pulse 62   Temp (!) 97.5 F (36.4 C) (Oral)   Ht 4' 8.5" (1.435 m)   Wt 178 lb 2 oz (80.8 kg)   SpO2 97%   BMI 39.23 kg/m   BP Readings from Last 3 Encounters:  03/03/18 (!) 134/58  12/02/17 119/60  03/15/17 110/67    Wt Readings from Last 3 Encounters:  03/03/18 178 lb 2 oz (80.8 kg)  12/02/17 178 lb (80.7 kg)  03/15/17 176 lb (79.8 kg)     Physical Exam  Constitutional: She is oriented to person, place, and time. She appears well-developed and well-nourished. No distress.  HENT:  Head: Normocephalic and atraumatic.  Eyes: Pupils are equal, round, and reactive to light. Conjunctivae and EOM are normal.  Neck: Normal range of motion. Neck supple.  Cardiovascular: Normal rate, regular rhythm and normal heart sounds.  No murmur heard. Pulmonary/Chest: Effort normal. No respiratory distress. She has wheezes (rhonchi and decreased expiratory phase). She has no rales.  Abdominal: Soft. Bowel sounds are normal. She exhibits no distension. There is no tenderness.  Musculoskeletal: She exhibits tenderness (Noted at both knees.  Also at the bruised areas of the right upper quadrant and left upper quadrant.). She exhibits no edema.       Right shoulder: She exhibits decreased range of motion (Patient  demonstrates decreased range of motion for external rotation of both shoulders.  She cannot externally rotate beyond neutral.  For abduction she only has 20 degrees bilaterally.) and tenderness. Bony tenderness: Tender at both knees for palpation over the patella. Swelling: 3+ from ankles to tibial tuberosity bilaterally.       Left shoulder: She exhibits decreased range of motion, tenderness and bony tenderness.       Right ankle: She exhibits swelling (3+ to the tibial plateau bilaterally).       Left ankle: She exhibits swelling.       Lumbar back: She exhibits decreased range of motion, tenderness and spasm. She exhibits no deformity and normal pulse.  Patient exhibits a slow antalgic and unsteady gait while using a walker with wheels and a brake.  Range of motion for the shoulders is neutral for external rotation 70 degrees for internal rotation 20 degrees for abduction, these are symmetric bilateral.  Range of motion for the trunk is full for flexion but  limited to 20-30 degrees for extension.  Strength is 2-3/5 for all parameters of the upper extremities.  Strength is 3/5 for the hips and knees bilaterally.    Neurological: She is alert and oriented to person, place, and time. She has normal reflexes. She displays atrophy. She exhibits abnormal muscle tone. Coordination and gait abnormal.  Skin: Skin is warm and dry. Bruising (Noted at left breast laterally and left upper quadrant.  Also laterally at right upper quadrant just below the breast tissue.) noted.  Psychiatric: She has a normal mood and affect.      Assessment & Plan:   [name] Walker was seen today for face to face.  Diagnoses and all orders for this visit:  Primary osteoarthritis involving multiple joints  Weakness  Atrial fibrillation, chronic (HCC)  Chronic obstructive pulmonary disease, unspecified COPD type ([place]) -     CBC with Differential/Platelet -     CMP14+EGFR  Frequent falls  Imbalance       I am  having [name] Walker maintain her albuterol, calcium-vitamin D, Vitamin D-Vitamin K (VITAMIN K2-VITAMIN D3 PO), OXYGEN, ibuprofen, triamcinolone cream, hydrOXYzine, levocetirizine, verapamil, omeprazole, potassium chloride, donepezil, ELIQUIS, furosemide, gabapentin, rOPINIRole, amitriptyline, citalopram, doxycycline, fluconazole, and ketoconazole. We administered cyanocobalamin. We will continue to administer cyanocobalamin.  Allergies as of 03/03/2018      Reactions   Diltiazem Anxiety, Other (See Comments)   Sleep disturbance   Iohexol     Desc: THROAT SWELLING-NOTED IN CHART AFTER IVC PLACEMENT-ARS 12-02-05   Latex Other (See Comments)   unknown   Penicillins Swelling   Patient was given zosyn this admission and tolerated. After d/w MD and pt, she had swelling in arm only, so possible just infiltration and not allergic. ABx changed to Cefazolin and is tolerating   Shellfish Allergy Other (See Comments)   Reaction unknown      Medication List        Accurate as of 03/03/18  1:59 PM. Always use your most recent med list.          albuterol (2.5 MG/3ML) 0.083% nebulizer solution Commonly known as:  PROVENTIL Take 3 mLs (2.5 mg total) by nebulization every 2 (two) hours as needed for wheezing or shortness of breath.   amitriptyline 75 MG tablet Commonly known as:  ELAVIL TAKE 1 TABLET AT BEDTIME   calcium-vitamin D 500-200 MG-UNIT tablet Take 1 tablet by mouth every morning.   citalopram 20 MG tablet Commonly known as:  CELEXA TAKE 1 TABLET DAILY   donepezil 10 MG tablet Commonly known as:  ARICEPT TAKE ONE TABLET AT BEDTIME   doxycycline 100 MG tablet Commonly known as:  VIBRA-TABS Take by mouth.   ELIQUIS 5 MG Tabs tablet Generic drug:  apixaban TAKE (1) TABLET TWICE A DAY.   fluconazole 150 MG tablet Commonly known as:  DIFLUCAN Take by mouth.   furosemide 40 MG tablet Commonly known as:  LASIX TAKE (2) TABLETS DAILY   gabapentin 300 MG  capsule Commonly known as:  NEURONTIN TAKE (1) CAPSULE TWICE DAILY.   hydrOXYzine 10 MG tablet Commonly known as:  ATARAX/VISTARIL TAKE (1) TABLET THREE TIMES DAILY.   ibuprofen 200 MG tablet Commonly known as:  ADVIL,MOTRIN Take 200 mg by mouth every 6 (six) hours as needed for moderate pain.   ketoconazole 2 % cream Commonly known as:  NIZORAL Apply topically.   levocetirizine 5 MG tablet Commonly known as:  XYZAL Take 1 tablet (5 mg total) by mouth at bedtime.   omeprazole  20 MG capsule Commonly known as:  PRILOSEC TAKE 2 CAPSULES DAILY AS NEEDED FOR REFLUX   OXYGEN Inhale into the lungs at bedtime.   potassium chloride 10 MEQ tablet Commonly known as:  K-DUR,KLOR-CON TAKE 4 TABLETS ONCE DAILY   rOPINIRole 1 MG tablet Commonly known as:  REQUIP TAKE 1 OR 2 TABLETS AT BEDTIME   triamcinolone cream 0.1 % Commonly known as:  KENALOG   verapamil 180 MG CR tablet Commonly known as:  CALAN-SR TAKE ONE TABLET AT BEDTIME   VITAMIN K2-VITAMIN D3 PO Take 2 capsules by mouth every morning.      After exam it appears patient should be able to do her own transfers safely.  She should be able to operate the power wheelchair safely although I doubt she would be safe on a scooter due to noted imbalance.  She is mentally stable for use of the power device.  She expresses a strong desire to have this device and is motivated to succeed in its use.  Due to the weakness of her shoulders and upper extremities and  limited range of motion, she cannot operate a manual wheelchair. Follow-up: Return in about 3 months (around 06/03/2018).  Claretta Fraise, M.D.  Addendum:  She can not walk over 2 steps with a cane without being at risk for falling.   Claretta Fraise, MD

## 2018-03-04 LAB — CBC WITH DIFFERENTIAL/PLATELET
Basophils Absolute: 0 10*3/uL (ref 0.0–0.2)
Basos: 1 %
EOS (ABSOLUTE): 0.2 10*3/uL (ref 0.0–0.4)
EOS: 3 %
Hematocrit: 33.3 % — ABNORMAL LOW (ref 34.0–46.6)
Hemoglobin: 11.4 g/dL (ref 11.1–15.9)
IMMATURE GRANS (ABS): 0 10*3/uL (ref 0.0–0.1)
IMMATURE GRANULOCYTES: 0 %
LYMPHS: 21 %
Lymphocytes Absolute: 1.2 10*3/uL (ref 0.7–3.1)
MCH: 31.5 pg (ref 26.6–33.0)
MCHC: 34.2 g/dL (ref 31.5–35.7)
MCV: 92 fL (ref 79–97)
MONOS ABS: 0.8 10*3/uL (ref 0.1–0.9)
Monocytes: 13 %
NEUTROS PCT: 62 %
Neutrophils Absolute: 3.6 10*3/uL (ref 1.4–7.0)
PLATELETS: 274 10*3/uL (ref 150–450)
RBC: 3.62 x10E6/uL — AB (ref 3.77–5.28)
RDW: 14.9 % (ref 12.3–15.4)
WBC: 5.8 10*3/uL (ref 3.4–10.8)

## 2018-03-04 LAB — CMP14+EGFR
ALBUMIN: 4.1 g/dL (ref 3.2–4.6)
ALK PHOS: 122 IU/L — AB (ref 39–117)
ALT: 13 IU/L (ref 0–32)
AST: 21 IU/L (ref 0–40)
Albumin/Globulin Ratio: 1.5 (ref 1.2–2.2)
BUN / CREAT RATIO: 19 (ref 12–28)
BUN: 19 mg/dL (ref 10–36)
Bilirubin Total: 0.2 mg/dL (ref 0.0–1.2)
CHLORIDE: 100 mmol/L (ref 96–106)
CO2: 26 mmol/L (ref 20–29)
CREATININE: 0.99 mg/dL (ref 0.57–1.00)
Calcium: 9.5 mg/dL (ref 8.7–10.3)
GFR calc Af Amer: 58 mL/min/{1.73_m2} — ABNORMAL LOW (ref >59)
GFR calc non Af Amer: 50 mL/min/{1.73_m2} — ABNORMAL LOW (ref >59)
GLOBULIN, TOTAL: 2.8 g/dL (ref 1.5–4.5)
GLUCOSE: 96 mg/dL (ref 65–99)
Potassium: 5.2 mmol/L (ref 3.5–5.2)
SODIUM: 140 mmol/L (ref 134–144)
Total Protein: 6.9 g/dL (ref 6.0–8.5)

## 2018-03-05 ENCOUNTER — Encounter: Payer: Self-pay | Admitting: *Deleted

## 2018-03-05 ENCOUNTER — Other Ambulatory Visit: Payer: Self-pay | Admitting: *Deleted

## 2018-03-05 NOTE — Patient Outreach (Signed)
[place] [facility]) Care Management  03/05/2018  [name] Walker 11/03/1924 [id number]  Self referral from patient: Reason: In need of welfare check and someone to check blood pressure & heart rate. Patient states she is not always able to get dressed & go to MD's office.   Telephone call to patient; phone rings-no answer; unable to leave message.  Plan: Geophysicist/field seismologist. Follow up in 2-4 business days.  Sherrin Daisy, RN BSN [place] Management Coordinator [facility] Care Management  [phone]

## 2018-03-11 ENCOUNTER — Other Ambulatory Visit: Payer: Self-pay | Admitting: *Deleted

## 2018-03-11 NOTE — Patient Outreach (Signed)
[place] [facility]) Care Management  03/11/2018  WAYNETTA METHENY 1924/12/23 [id number]   Self referral from patient: Reason: In need of welfare check and someone to check blood pressure & heart rate. Patient states she is not always able to get dressed & go to MD's office.  Telephone call # 2 to patient who was advised of reason for call & [facility] care management services. HIPPA verification received from patient.   Patient voices she has osteoarthritis and does have pain from time to time. States she takes tylenol for pain. Voices she has trouble with blood clots and takes Eliquis.  States she does not have heart trouble or lung problem (COPD).  Patient voices that she lives alone but have family members that live in her neighborhood. States she is independent of her personal care and takes care of preparing her meals. States she likes living alone.  States she has assistance with grocery shopping & someone takes her to store. States she is not driving now. States friends take her to MD appointments.   States medications are delivered to her home. States they are prepacked and she has no problem taking them as prescribed by her MD. States getting prescriptions filled without problem.   Voices she uses walker to get around in home because she is weaker and balance is off sometimes. States she has fallen several times this year and sustained bruises. States family member or EMS helps her get up. States she has medic alert which she wears all the time.   Assessment: -84 year old patient living alone -hx osteoarthritis, atrial fibrillation, Copd _High falls risk- trouble walking, due to pain, off balance, osteoarthritis, weakness -Referral to community care coordinator is appropriate  -Patient needs further in home eval/assessment of care needs and management of chronic conditions.    Plan: Referral to [facility] care coordinator foe further in home assessment/evaluation of care  needs & management of chronic condition.  Sherrin Daisy, RN BSN [place] Management Coordinator [facility] Care Management  [phone]

## 2018-03-13 ENCOUNTER — Other Ambulatory Visit: Payer: Self-pay | Admitting: *Deleted

## 2018-03-13 NOTE — Patient Outreach (Signed)
Telephone call to pt to schedule initial home visit, no answer to home telephone [phone] and no option to leave voicemail, no answer to [phone] and received message "changed or disconnected" RN CM mailed unsuccessful outreach letter to patient's home.  PLAN Outreach pt in 3-4 business days  [name] [facility], [place] [phone]

## 2018-03-18 ENCOUNTER — Other Ambulatory Visit: Payer: Self-pay | Admitting: *Deleted

## 2018-03-18 ENCOUNTER — Other Ambulatory Visit: Payer: Self-pay | Admitting: Family Medicine

## 2018-03-18 NOTE — Patient Outreach (Signed)
Second attempt outreach to schedule initial home visit, no answer to telephone [phone] and no option to leave voicemail, [phone] is still disconnected.  PLAN Outreach pt 3-4 business days  [name] [facility], [place] Coordinator [phone]

## 2018-03-21 ENCOUNTER — Other Ambulatory Visit: Payer: Self-pay | Admitting: *Deleted

## 2018-03-21 NOTE — Patient Outreach (Signed)
Telephone call to pt to schedule home visit, (3rd attempt)  No answer to telephone [phone] and no option to leave voicemail,  [phone] is still disconnected.  PLAN Close case in 3 business days  [name] [facility], [place] Coordinator [phone]

## 2018-03-24 ENCOUNTER — Other Ambulatory Visit: Payer: Self-pay | Admitting: Family Medicine

## 2018-03-24 ENCOUNTER — Other Ambulatory Visit: Payer: Self-pay

## 2018-03-24 ENCOUNTER — Encounter (HOSPITAL_COMMUNITY): Payer: Self-pay | Admitting: Emergency Medicine

## 2018-03-24 ENCOUNTER — Emergency Department (HOSPITAL_COMMUNITY)
Admission: EM | Admit: 2018-03-24 | Discharge: 2018-03-24 | Disposition: A | Discharge status: Home / Self Care | Payer: Medicare Other | Attending: Emergency Medicine | Admitting: Emergency Medicine

## 2018-03-24 ENCOUNTER — Emergency Department (HOSPITAL_COMMUNITY): Payer: Medicare Other

## 2018-03-24 DIAGNOSIS — I872 Venous insufficiency (chronic) (peripheral): Secondary | ICD-10-CM | POA: Insufficient documentation

## 2018-03-24 DIAGNOSIS — Z85858 Personal history of malignant neoplasm of other endocrine glands: Secondary | ICD-10-CM | POA: Diagnosis not present

## 2018-03-24 DIAGNOSIS — M79605 Pain in left leg: Secondary | ICD-10-CM | POA: Diagnosis not present

## 2018-03-24 DIAGNOSIS — Z7901 Long term (current) use of anticoagulants: Secondary | ICD-10-CM | POA: Diagnosis not present

## 2018-03-24 DIAGNOSIS — Z96653 Presence of artificial knee joint, bilateral: Secondary | ICD-10-CM | POA: Insufficient documentation

## 2018-03-24 DIAGNOSIS — Z96643 Presence of artificial hip joint, bilateral: Secondary | ICD-10-CM | POA: Insufficient documentation

## 2018-03-24 DIAGNOSIS — J90 Pleural effusion, not elsewhere classified: Secondary | ICD-10-CM | POA: Diagnosis not present

## 2018-03-24 DIAGNOSIS — J449 Chronic obstructive pulmonary disease, unspecified: Secondary | ICD-10-CM | POA: Insufficient documentation

## 2018-03-24 DIAGNOSIS — R079 Chest pain, unspecified: Secondary | ICD-10-CM | POA: Insufficient documentation

## 2018-03-24 DIAGNOSIS — Z91040 Latex allergy status: Secondary | ICD-10-CM | POA: Insufficient documentation

## 2018-03-24 DIAGNOSIS — R2242 Localized swelling, mass and lump, left lower limb: Secondary | ICD-10-CM | POA: Diagnosis not present

## 2018-03-24 DIAGNOSIS — R600 Localized edema: Secondary | ICD-10-CM | POA: Diagnosis not present

## 2018-03-24 DIAGNOSIS — M79604 Pain in right leg: Secondary | ICD-10-CM

## 2018-03-24 DIAGNOSIS — R2241 Localized swelling, mass and lump, right lower limb: Secondary | ICD-10-CM | POA: Diagnosis not present

## 2018-03-24 DIAGNOSIS — R0602 Shortness of breath: Secondary | ICD-10-CM | POA: Diagnosis not present

## 2018-03-24 DIAGNOSIS — I10 Essential (primary) hypertension: Secondary | ICD-10-CM | POA: Diagnosis not present

## 2018-03-24 DIAGNOSIS — R52 Pain, unspecified: Secondary | ICD-10-CM | POA: Diagnosis not present

## 2018-03-24 DIAGNOSIS — R2243 Localized swelling, mass and lump, lower limb, bilateral: Secondary | ICD-10-CM | POA: Diagnosis present

## 2018-03-24 DIAGNOSIS — Z79899 Other long term (current) drug therapy: Secondary | ICD-10-CM | POA: Insufficient documentation

## 2018-03-24 DIAGNOSIS — R609 Edema, unspecified: Secondary | ICD-10-CM | POA: Diagnosis not present

## 2018-03-24 DIAGNOSIS — M79606 Pain in leg, unspecified: Secondary | ICD-10-CM | POA: Diagnosis not present

## 2018-03-24 DIAGNOSIS — R229 Localized swelling, mass and lump, unspecified: Secondary | ICD-10-CM | POA: Diagnosis not present

## 2018-03-24 LAB — CBC WITH DIFFERENTIAL/PLATELET
BASOS ABS: 0 10*3/uL (ref 0.0–0.1)
Basophils Relative: 0 %
EOS PCT: 4 %
Eosinophils Absolute: 0.2 10*3/uL (ref 0.0–0.7)
HCT: 31.6 % — ABNORMAL LOW (ref 36.0–46.0)
Hemoglobin: 10 g/dL — ABNORMAL LOW (ref 12.0–15.0)
LYMPHS ABS: 1.2 10*3/uL (ref 0.7–4.0)
LYMPHS PCT: 23 %
MCH: 31.9 pg (ref 26.0–34.0)
MCHC: 31.6 g/dL (ref 30.0–36.0)
MCV: 101 fL — ABNORMAL HIGH (ref 78.0–100.0)
MONO ABS: 0.9 10*3/uL (ref 0.1–1.0)
Monocytes Relative: 18 %
NEUTROS ABS: 2.8 10*3/uL (ref 1.7–7.7)
NEUTROS PCT: 55 %
PLATELETS: 187 10*3/uL (ref 150–400)
RBC: 3.13 MIL/uL — AB (ref 3.87–5.11)
RDW: 14.4 % (ref 11.5–15.5)
WBC: 5.1 10*3/uL (ref 4.0–10.5)

## 2018-03-24 LAB — BASIC METABOLIC PANEL
Anion gap: 6 (ref 5–15)
BUN: 11 mg/dL (ref 8–23)
CHLORIDE: 105 mmol/L (ref 98–111)
CO2: 29 mmol/L (ref 22–32)
Calcium: 8.5 mg/dL — ABNORMAL LOW (ref 8.9–10.3)
Creatinine, Ser: 0.83 mg/dL (ref 0.44–1.00)
GFR calc non Af Amer: >60 mL/min (ref >60)
Glucose, Bld: 100 mg/dL — ABNORMAL HIGH (ref 70–99)
POTASSIUM: 4.2 mmol/L (ref 3.5–5.1)
SODIUM: 140 mmol/L (ref 135–145)

## 2018-03-24 LAB — TROPONIN I

## 2018-03-24 NOTE — ED Provider Notes (Signed)
[facility] EMERGENCY DEPARTMENT Provider Note   CSN: [id number] Arrival date & time: 03/24/18  0957     History   Chief Complaint Chief Complaint  Patient presents with  . Bleeding/Bruising    HPI [name] Walker is a 84 y.o. female.  She is a history of chronic lower extremity edema and uses elastic stockings.  She has a history of DVT and is on anticoagulation.  She presents today complaining of increased pain and swelling in her lower legs left greater than right and concerns for having developed a DVT.  She states the swelling in the legs is usually in her thighs when she has compressive stockings on it when she takes them off it settles down in the legs.  She has some baseline shortness of breath and is on oxygen and complains of some vague chest discomfort on and off.  She states is been taking her medicines regular.  States these problems have been going on for 2 years plus.  The history is provided by the patient and a relative.  Leg Pain   This is a chronic problem. Episode onset: 3 days. The problem occurs constantly. The problem has been gradually worsening. The pain is present in the left lower leg and right lower leg. The quality of the pain is described as aching. The pain is moderate. Associated symptoms include limited range of motion and stiffness. The symptoms are aggravated by standing and activity. She has tried nothing for the symptoms. The treatment provided no relief. There has been no history of extremity trauma.    Past Medical History:  Diagnosis Date  . Cataract   . COPD (chronic obstructive pulmonary disease) ([place])   . Depression   . DVT (deep venous thrombosis) ([place])   . Gastric erosion   . GERD (gastroesophageal reflux disease)   . Hypertension   . Insomnia   . Ischemic colitis ([place])   . Osteoarthritis   . Parotid mass 2016  . 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 Primary osteoarthritis involving multiple joints 03/03/2018  . Imbalance 03/03/2018  . Frequent falls 03/03/2018  . Supplemental oxygen dependent 07/14/2016  . C5 vertebral fracture ([place]) 05/25/2016  . Hypokalemia 05/23/2016  . B12 deficiency 08/24/2015  . Insomnia 08/15/2015  . 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 Atrial fibrillation, chronic ([place]) 07/22/2015  . H/O parotidectomy 05/17/2015  . Osteopenia 05/13/2015  . Weakness 07/15/2013  . Anemia 01/06/2009  . Depression, recurrent ([place]) 01/06/2009  . Essential hypertension 01/06/2009  . COPD (chronic obstructive pulmonary disease) ([place]) 01/06/2009  . GERD 01/06/2009  . IRRITABLE BOWEL SYNDROME 01/06/2009    Past Surgical History:  Procedure Laterality Date  . ABDOMINAL HYSTERECTOMY    . BACK SURGERY    . Cataracts    . CHOLECYSTECTOMY    . COLONOSCOPY    . ESOPHAGOGASTRODUODENOSCOPY  10/17/2007   erosion   . KNEE ARTHROPLASTY     bilateral  . PAROTIDECTOMY Left 05/17/2015   Procedure: PAROTIDECTOMY- LEFT TOTAL;  Surgeon: Leta Baptist, MD;  Location: [place];  Service: ENT;  Laterality: Left;  . TOTAL HIP ARTHROPLASTY     bilateral     OB History   None      Home Medications    Prior to Admission medications   Medication Sig Start Date End Date Taking? Authorizing Provider  albuterol (PROVENTIL) (  2.5 MG/3ML) 0.083% nebulizer solution Take 3 mLs (2.5 mg total) by nebulization every 2 (two) hours as needed for wheezing or shortness of breath. 07/31/15   Isaac Bliss, Rayford Halsted, MD  amitriptyline (ELAVIL) 75 MG tablet TAKE 1 TABLET AT BEDTIME 03/19/18   Claretta Fraise, MD  Calcium Carbonate-Vitamin D (CALCIUM-VITAMIN D) 500-200 MG-UNIT tablet Take 1 tablet by mouth every morning.    [provider]  citalopram (CELEXA) 20 MG tablet TAKE 1 TABLET DAILY 02/25/18   Claretta Fraise, MD  donepezil (ARICEPT) 10 MG tablet TAKE ONE TABLET AT BEDTIME 12/20/17    Claretta Fraise, MD  ELIQUIS 5 MG TABS tablet TAKE (1) TABLET TWICE A DAY. 12/30/17   Claretta Fraise, MD  fluconazole (DIFLUCAN) 150 MG tablet Take by mouth. 02/27/18   [provider]  furosemide (LASIX) 40 MG tablet TAKE (2) TABLETS DAILY 01/13/18   Claretta Fraise, MD  gabapentin (NEURONTIN) 300 MG capsule TAKE (1) CAPSULE TWICE DAILY. 01/13/18   Claretta Fraise, MD  hydrOXYzine (ATARAX/VISTARIL) 10 MG tablet TAKE (1) TABLET THREE TIMES DAILY. 05/31/17   Claretta Fraise, MD  ibuprofen (ADVIL,MOTRIN) 200 MG tablet Take 200 mg by mouth every 6 (six) hours as needed for moderate pain.    [provider]  ketoconazole (NIZORAL) 2 % cream Apply topically. 02/27/18 02/27/19  [provider]  levocetirizine (XYZAL) 5 MG tablet Take 1 tablet (5 mg total) by mouth at bedtime. 06/10/17   Claretta Fraise, MD  omeprazole (PRILOSEC) 20 MG capsule TAKE 2 CAPSULES DAILY AS NEEDED FOR REFLUX 12/06/17   Claretta Fraise, MD  OXYGEN Inhale into the lungs at bedtime.    [provider]  potassium chloride (K-DUR,KLOR-CON) 10 MEQ tablet TAKE 4 TABLETS ONCE DAILY 12/06/17   Claretta Fraise, MD  rOPINIRole (REQUIP) 1 MG tablet TAKE 1 OR 2 TABLETS AT BEDTIME 01/14/18   Claretta Fraise, MD  triamcinolone cream (KENALOG) 0.1 %  03/13/17   [provider]  verapamil (CALAN-SR) 180 MG CR tablet TAKE ONE TABLET AT BEDTIME 12/06/17   Claretta Fraise, MD  Vitamin D-Vitamin K (VITAMIN K2-VITAMIN D3 PO) Take 2 capsules by mouth every morning.    [provider]    Family History Family History  Problem Relation Age of Onset  . Rheum arthritis Mother   . Ulcers Mother        on foot  . Ulcers Father   . Cancer Sister        uterine    Social History Social History   Tobacco Use  . Smoking status: Never Smoker  . Smokeless tobacco: Never Used  Substance Use Topics  . Alcohol use: No  . Drug use: No     Allergies   Diltiazem; Iohexol; Latex; Penicillins; and Shellfish  allergy   Review of Systems Review of Systems  Constitutional: Negative for fever.  HENT: Negative for sore throat.   Eyes: Negative for visual disturbance.  Respiratory: Positive for shortness of breath.   Cardiovascular: Positive for chest pain and leg swelling. Negative for palpitations.  Gastrointestinal: Negative for abdominal pain, nausea and vomiting.  Genitourinary: Negative for dysuria.  Musculoskeletal: Positive for stiffness. Negative for neck pain.  Skin: Positive for color change (bruising both knees). Negative for rash.  Neurological: Negative for headaches.     Physical Exam Updated Vital Signs BP (!) 136/56 (BP Location: Left Arm)   Pulse 64   Temp 98.1 F (36.7 C) (Oral)   Resp 20   Ht 4\' 9"  (1.448  m)   Wt 81.6 kg (180 lb)   SpO2 98%   BMI 38.95 kg/m   Physical Exam  Constitutional: She appears well-developed and well-nourished.  HENT:  Head: Normocephalic and atraumatic.  Right Ear: External ear normal.  Left Ear: External ear normal.  Mouth/Throat: Oropharynx is clear and moist.  Eyes: Conjunctivae are normal.  Neck: Neck supple.  Cardiovascular: Normal rate, regular rhythm and normal heart sounds.  Pulmonary/Chest: Effort normal. She has no wheezes. She has no rales.  Abdominal: Soft. There is no tenderness. There is no guarding.  Musculoskeletal:  She has chronic stasis changes on bilateral lower extremities.  There is 1+ edema.  There is some bruising on the inner aspect of both knees although it is nontender.  Her cap refill is intact.  There are no obvious ulcerations or wounds.  Neurological: She is alert. GCS eye subscore is 4. GCS verbal subscore is 5. GCS motor subscore is 6.  Skin: Skin is warm and dry. Capillary refill takes less than 2 seconds.  Psychiatric: She has a normal mood and affect.     ED Treatments / Results  Labs (all labs ordered are listed, but only abnormal results are displayed) Labs Reviewed  BASIC METABOLIC PANEL  - Abnormal; Notable for the following components:      Result Value   Glucose, Bld 100 (*)    Calcium 8.5 (*)    All other components within normal limits  CBC WITH DIFFERENTIAL/PLATELET - Abnormal; Notable for the following components:   RBC 3.13 (*)    Hemoglobin 10.0 (*)    HCT 31.6 (*)    MCV 101.0 (*)    All other components within normal limits  TROPONIN I    EKG EKG Interpretation  Date/Time:  Monday March 24 2018 10:34:19 EDT Ventricular Rate:  66 PR Interval:    QRS Duration: 125 QT Interval:  436 QTC Calculation: 457 R Axis:   57 Text Interpretation:  Sinus rhythm Nonspecific intraventricular conduction delay similar to prior 8/17 Confirmed by Aletta Edouard [phone]) on 03/24/2018 10:51:27 AM   Radiology Dg Chest 2 View  Result Date: 03/24/2018 CLINICAL DATA:  Lower extremity swelling. EXAM: CHEST - 2 VIEW COMPARISON:  Radiograph of May 23, 2016. FINDINGS: Stable cardiomegaly and central pulmonary vascular congestion. No pneumothorax is noted. Atherosclerosis of thoracic aorta is noted. Minimal bilateral pleural effusions are noted. Degenerative changes are seen involving both glenohumeral joints. IMPRESSION: Stable cardiomegaly with mild central pulmonary vascular congestion. Minimal bilateral pleural effusions are noted. Aortic Atherosclerosis (ICD10-I70.0). Electronically Signed   By: Marijo Conception, M.D.   On: 03/24/2018 11:37   US Venous Img Lower Bilateral  Result Date: 03/24/2018 CLINICAL DATA:  Bilateral lower extremity pain and edema. History of remote DVT affecting the left lower extremity. Patient remains on anticoagulation. Evaluate for acute or chronic DVT. EXAM: BILATERAL LOWER EXTREMITY VENOUS DOPPLER ULTRASOUND TECHNIQUE: Gray-scale sonography with graded compression, as well as color Doppler and duplex ultrasound were performed to evaluate the lower extremity deep venous systems from the level of the common femoral vein and including the common femoral,  femoral, profunda femoral, popliteal and calf veins including the posterior tibial, peroneal and gastrocnemius veins when visible. The superficial great saphenous vein was also interrogated. Spectral Doppler was utilized to evaluate flow at rest and with distal augmentation maneuvers in the common femoral, femoral and popliteal veins. COMPARISON:  None. FINDINGS: RIGHT LOWER EXTREMITY Common Femoral Vein: No evidence of thrombus. Normal compressibility, respiratory phasicity and  response to augmentation. Saphenofemoral Junction: No evidence of thrombus. Normal compressibility and flow on color Doppler imaging. Profunda Femoral Vein: No evidence of thrombus. Normal compressibility and flow on color Doppler imaging. Femoral Vein: No evidence of thrombus. Normal compressibility, respiratory phasicity and response to augmentation. Popliteal Vein: No evidence of thrombus. Normal compressibility, respiratory phasicity and response to augmentation. Calf Veins: No evidence of thrombus. Normal compressibility and flow on color Doppler imaging. Superficial Great Saphenous Vein: No evidence of thrombus. Normal compressibility. Venous Reflux:  None. Other Findings:  None. LEFT LOWER EXTREMITY Common Femoral Vein: No evidence of thrombus. Normal compressibility, respiratory phasicity and response to augmentation. Saphenofemoral Junction: No evidence of thrombus. Normal compressibility and flow on color Doppler imaging. Profunda Femoral Vein: No evidence of thrombus. Normal compressibility and flow on color Doppler imaging. Femoral Vein: No evidence of thrombus. Normal compressibility, respiratory phasicity and response to augmentation. Popliteal Vein: No evidence of thrombus. Normal compressibility, respiratory phasicity and response to augmentation. Calf Veins: No evidence of thrombus. Normal compressibility and flow on color Doppler imaging. Superficial Great Saphenous Vein: No evidence of thrombus. Normal compressibility.  Venous Reflux:  None. Other Findings:  None. IMPRESSION: No evidence of acute or chronic DVT within either lower extremity. Electronically Signed   By: Sandi Mariscal M.D.   On: 03/24/2018 11:54    Procedures Procedures (including critical care time)  Medications Ordered in ED Medications - No data to display   Initial Impression / Assessment and Plan / ED Course  I have reviewed the triage vital signs and the nursing notes.  Pertinent labs & imaging results that were available during my care of the patient were reviewed by me and considered in my medical decision making (see chart for details).  Clinical Course as of Mar 25 1020  Mon Mar 24, 2018  1236 Patient had some basic screening labs and a Doppler of bilateral lower extremities.  Doppler is negative for any signs of DVT and her labs are fairly normal although she is slightly more anemic than baseline.  I feel she can be referred back to her primary care doctor and continue to use her compression stockings in the meantime.   [MB]    Clinical Course User Index [MB] Hayden Rasmussen, MD     Final Clinical Impressions(s) / ED Diagnoses   Final diagnoses:  Edema of both lower extremities due to peripheral venous insufficiency    ED Discharge Orders    None       Hayden Rasmussen, MD 03/25/18 1022

## 2018-03-24 NOTE — Discharge Instructions (Addendum)
You were evaluated in the emergency room for concerns of a blood clot in your legs because of the increased swelling.  Your ultrasound did not show any signs of blood clot.  We did some basic blood work and other than anemia no obvious problems were found.  Will be important for you to follow-up with your primary care doctor and continue your regular medications.  Please return if any problems.

## 2018-03-24 NOTE — ED Triage Notes (Signed)
Patient complaining of bruising and swelling to bilateral lower legs x 1 week.

## 2018-03-24 NOTE — ED Notes (Signed)
Pt has chronic swelling for last 10 years in both legs.  Here today c/o pain to left lower leg and both legs are swollen.  Rates pain 9/10.

## 2018-03-26 ENCOUNTER — Telehealth: Payer: Self-pay

## 2018-03-26 ENCOUNTER — Other Ambulatory Visit: Payer: Self-pay | Admitting: *Deleted

## 2018-03-26 DIAGNOSIS — L03116 Cellulitis of left lower limb: Principal | ICD-10-CM

## 2018-03-26 DIAGNOSIS — L03115 Cellulitis of right lower limb: Secondary | ICD-10-CM

## 2018-03-26 NOTE — Patient Outreach (Signed)
Pt did not respond to telephone calls or unsuccessful outreach letter mailed to her home, case closed, RN CM mailed pt case closure letter, faxed primary MD Dr. Livia Snellen case closure letter.  Case closed  [name] [facility], [place] Coordinator [phone]

## 2018-03-26 NOTE — Telephone Encounter (Signed)
Wants a referral for wound care in [facility] for her legs

## 2018-03-26 NOTE — Telephone Encounter (Signed)
Referral has been ordered to wound center in [place]. No answer at patient's number.

## 2018-03-26 NOTE — Telephone Encounter (Signed)
Please refer as pt requests 

## 2018-03-31 ENCOUNTER — Ambulatory Visit (INDEPENDENT_AMBULATORY_CARE_PROVIDER_SITE_OTHER): Payer: Medicare Other | Admitting: Family Medicine

## 2018-03-31 ENCOUNTER — Encounter: Payer: Self-pay | Admitting: Family Medicine

## 2018-03-31 VITALS — BP 135/58 | HR 70 | Temp 98.1°F | Ht <= 58 in | Wt 180.0 lb

## 2018-03-31 DIAGNOSIS — R296 Repeated falls: Secondary | ICD-10-CM

## 2018-03-31 DIAGNOSIS — R609 Edema, unspecified: Secondary | ICD-10-CM

## 2018-03-31 NOTE — Progress Notes (Signed)
BP (!) 135/58   Pulse 70   Temp 98.1 F (36.7 C) (Oral)   Ht 4\' 9"  (1.448 m)   Wt 180 lb (81.6 kg)   BMI 38.95 kg/m    Subjective:    Patient ID: [name] Walker, female    DOB: 1925/05/02, 84 y.o.   MRN: [id number]  HPI: [name] Walker is a 84 y.o. female presenting on 03/31/2018 for ER Follow up (supposed to be wearing compression stockings, needs home health to come help her with them)   HPI Patient is coming in today for increased swelling in her legs and pain with the swelling and recurrent falls.  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  She denies any fevers or chills but does have some redness in her legs and has the numbness and tingling sensation in both of her lower extremities along with the swelling.  She has compression stockings but cannot get them on herself.  She has had recurrent falls but nothing within the past 2 weeks.  Relevant past medical, surgical, family and social history reviewed and updated as indicated. Interim medical history since our last visit reviewed. Allergies and medications reviewed and updated.  Review of Systems  Constitutional: Negative for chills and fever.  Eyes: Negative for visual disturbance.  Respiratory: Negative for chest tightness and shortness of breath.   Cardiovascular: Positive for leg swelling (3+). Negative for chest pain.  Musculoskeletal: Negative for back pain and gait problem.  Skin: Negative for rash.  Neurological: Positive for weakness and numbness. Negative for light-headedness and headaches.  Psychiatric/Behavioral: Negative for agitation and behavioral problems.  All other systems reviewed and are negative.   Per HPI unless specifically indicated above   Allergies as of 03/31/2018      Reactions   Diltiazem Anxiety, Other (See Comments)   Sleep disturbance   Iohexol     Desc: THROAT SWELLING-NOTED IN CHART AFTER IVC PLACEMENT-ARS 12-02-05   Latex Other (See Comments)   unknown   Penicillins Swelling   Patient was given zosyn this admission and tolerated. After d/w MD and pt, she had swelling in arm only, so possible just infiltration and not allergic. ABx changed to Cefazolin and is tolerating   Shellfish Allergy Other (See Comments)   Reaction unknown      Medication List        Accurate as of 03/31/18  4:47 PM. Always use your most recent med list.          acetaminophen 500 MG tablet Commonly known as:  TYLENOL Take 1,000 mg by mouth every 6 (six) hours as needed.   albuterol (2.5 MG/3ML) 0.083% nebulizer solution Commonly known as:  PROVENTIL Take 3 mLs (2.5 mg total) by nebulization every 2 (two) hours as needed for wheezing or shortness of breath.   amitriptyline 75 MG tablet Commonly known as:  ELAVIL TAKE 1 TABLET AT BEDTIME   calcium-vitamin D 500-200 MG-UNIT tablet Take 1 tablet by mouth every morning.   citalopram 20 MG tablet Commonly known as:  CELEXA TAKE 1 TABLET DAILY   donepezil 10 MG tablet Commonly known as:  ARICEPT TAKE ONE TABLET AT BEDTIME   ELIQUIS 5 MG Tabs tablet Generic drug:  apixaban TAKE (1) TABLET TWICE A DAY.   furosemide 40 MG tablet Commonly known as:  LASIX TAKE (2) TABLETS DAILY   gabapentin 300 MG capsule Commonly known as:  NEURONTIN TAKE (1) CAPSULE TWICE DAILY.  hydrOXYzine 10 MG tablet Commonly known as:  ATARAX/VISTARIL TAKE (1) TABLET THREE TIMES DAILY.   ketoconazole 2 % cream Commonly known as:  NIZORAL Apply 1 application topically daily as needed.   levocetirizine 5 MG tablet Commonly known as:  XYZAL Take 1 tablet (5 mg total) by mouth at bedtime.   omeprazole 20 MG capsule Commonly known as:  PRILOSEC TAKE 2 CAPSULES DAILY AS NEEDED FOR REFLUX   OXYGEN Inhale into the lungs at bedtime.   potassium chloride 10 MEQ tablet Commonly known as:  K-DUR,KLOR-CON TAKE 4 TABLETS ONCE DAILY   rOPINIRole 1 MG tablet Commonly known as:   REQUIP TAKE 1 OR 2 TABLETS AT BEDTIME   triamcinolone cream 0.1 % Commonly known as:  KENALOG Apply 1 application topically 2 (two) times daily.   verapamil 180 MG CR tablet Commonly known as:  CALAN-SR TAKE ONE TABLET AT BEDTIME   VITAMIN K2-VITAMIN D3 PO Take 2 capsules by mouth every morning.          Objective:    BP (!) 135/58   Pulse 70   Temp 98.1 F (36.7 C) (Oral)   Ht 4\' 9"  (1.448 m)   Wt 180 lb (81.6 kg)   BMI 38.95 kg/m   Wt Readings from Last 3 Encounters:  03/31/18 180 lb (81.6 kg)  03/24/18 180 lb (81.6 kg)  03/03/18 178 lb 2 oz (80.8 kg)    Physical Exam  Constitutional: She is oriented to person, place, and time. She appears well-developed and well-nourished. No distress.  Eyes: Pupils are equal, round, and reactive to light. Conjunctivae and EOM are normal.  Musculoskeletal: Normal range of motion. She exhibits edema (3+).  Neurological: She is alert and oriented to person, place, and time. Coordination normal.  Skin: Skin is warm and dry. No rash noted. She is not diaphoretic. There is erythema (Mild erythema lower extremities but no warmth).  Psychiatric: She has a normal mood and affect. Her behavior is normal.  Nursing note and vitals reviewed.       Assessment & Plan:   Problem List Items Addressed This Visit      Other   Frequent falls   Relevant Orders   Home Health   Face-to-face encounter (required for Medicare/Medicaid patients)    Other Visit Diagnoses    Peripheral edema    -  Primary   Will apply Ace wraps recommend patient to do them every day, she wants to try for home health care we will see what we can get for her.   Relevant Orders   Home Health   Face-to-face encounter (required for Medicare/Medicaid patients)       Follow up plan: Return if symptoms worsen or fail to improve.  Counseling provided for all of the vaccine components Orders Placed This Encounter  Procedures  . Home Health  . Face-to-face  encounter (required for Medicare/Medicaid patients)    Caryl Pina, MD [facility] 03/31/2018, 4:47 PM

## 2018-04-02 ENCOUNTER — Other Ambulatory Visit: Payer: Self-pay | Admitting: Family Medicine

## 2018-04-04 DIAGNOSIS — Z9181 History of falling: Secondary | ICD-10-CM | POA: Diagnosis not present

## 2018-04-04 DIAGNOSIS — I890 Lymphedema, not elsewhere classified: Secondary | ICD-10-CM | POA: Diagnosis not present

## 2018-04-04 DIAGNOSIS — Z7901 Long term (current) use of anticoagulants: Secondary | ICD-10-CM | POA: Diagnosis not present

## 2018-04-04 DIAGNOSIS — Z9981 Dependence on supplemental oxygen: Secondary | ICD-10-CM | POA: Diagnosis not present

## 2018-04-04 DIAGNOSIS — G629 Polyneuropathy, unspecified: Secondary | ICD-10-CM | POA: Diagnosis not present

## 2018-04-07 DIAGNOSIS — R600 Localized edema: Secondary | ICD-10-CM | POA: Diagnosis not present

## 2018-04-07 DIAGNOSIS — R531 Weakness: Secondary | ICD-10-CM | POA: Diagnosis not present

## 2018-04-07 DIAGNOSIS — I482 Chronic atrial fibrillation: Secondary | ICD-10-CM | POA: Diagnosis not present

## 2018-04-07 DIAGNOSIS — J449 Chronic obstructive pulmonary disease, unspecified: Secondary | ICD-10-CM | POA: Diagnosis not present

## 2018-04-07 DIAGNOSIS — M150 Primary generalized (osteo)arthritis: Secondary | ICD-10-CM | POA: Diagnosis not present

## 2018-04-07 DIAGNOSIS — Z9181 History of falling: Secondary | ICD-10-CM | POA: Diagnosis not present

## 2018-04-08 ENCOUNTER — Telehealth: Payer: Self-pay | Admitting: Family Medicine

## 2018-04-08 DIAGNOSIS — R600 Localized edema: Secondary | ICD-10-CM | POA: Diagnosis not present

## 2018-04-08 DIAGNOSIS — Z9181 History of falling: Secondary | ICD-10-CM | POA: Diagnosis not present

## 2018-04-08 DIAGNOSIS — Z86718 Personal history of other venous thrombosis and embolism: Secondary | ICD-10-CM | POA: Diagnosis not present

## 2018-04-08 DIAGNOSIS — I890 Lymphedema, not elsewhere classified: Secondary | ICD-10-CM | POA: Diagnosis not present

## 2018-04-08 DIAGNOSIS — I872 Venous insufficiency (chronic) (peripheral): Secondary | ICD-10-CM | POA: Diagnosis not present

## 2018-04-08 DIAGNOSIS — Z7901 Long term (current) use of anticoagulants: Secondary | ICD-10-CM | POA: Diagnosis not present

## 2018-04-08 DIAGNOSIS — Z9981 Dependence on supplemental oxygen: Secondary | ICD-10-CM | POA: Diagnosis not present

## 2018-04-08 DIAGNOSIS — G629 Polyneuropathy, unspecified: Secondary | ICD-10-CM | POA: Diagnosis not present

## 2018-04-08 NOTE — Telephone Encounter (Signed)
FYI

## 2018-04-10 DIAGNOSIS — I890 Lymphedema, not elsewhere classified: Secondary | ICD-10-CM | POA: Diagnosis not present

## 2018-04-10 DIAGNOSIS — G629 Polyneuropathy, unspecified: Secondary | ICD-10-CM | POA: Diagnosis not present

## 2018-04-10 DIAGNOSIS — Z7901 Long term (current) use of anticoagulants: Secondary | ICD-10-CM | POA: Diagnosis not present

## 2018-04-10 DIAGNOSIS — Z9981 Dependence on supplemental oxygen: Secondary | ICD-10-CM | POA: Diagnosis not present

## 2018-04-10 DIAGNOSIS — Z9181 History of falling: Secondary | ICD-10-CM | POA: Diagnosis not present

## 2018-04-14 ENCOUNTER — Encounter: Payer: Medicare Other | Admitting: *Deleted

## 2018-04-14 ENCOUNTER — Other Ambulatory Visit: Payer: Self-pay | Admitting: Family Medicine

## 2018-04-15 ENCOUNTER — Ambulatory Visit (INDEPENDENT_AMBULATORY_CARE_PROVIDER_SITE_OTHER): Payer: Medicare Other

## 2018-04-15 DIAGNOSIS — G629 Polyneuropathy, unspecified: Secondary | ICD-10-CM

## 2018-04-15 DIAGNOSIS — Z9981 Dependence on supplemental oxygen: Secondary | ICD-10-CM

## 2018-04-15 DIAGNOSIS — Z9181 History of falling: Secondary | ICD-10-CM

## 2018-04-15 DIAGNOSIS — Z7901 Long term (current) use of anticoagulants: Secondary | ICD-10-CM | POA: Diagnosis not present

## 2018-04-15 DIAGNOSIS — I890 Lymphedema, not elsewhere classified: Secondary | ICD-10-CM | POA: Diagnosis not present

## 2018-04-16 ENCOUNTER — Other Ambulatory Visit: Payer: Self-pay | Admitting: *Deleted

## 2018-04-16 ENCOUNTER — Telehealth: Payer: Self-pay | Admitting: *Deleted

## 2018-04-16 NOTE — Telephone Encounter (Signed)
VM received [name] Walker PT FYI that patient fell yesterday afternoon Did not go to ED, was just sore, no injury refused PT, will have PT on Friday

## 2018-04-18 DIAGNOSIS — Z9181 History of falling: Secondary | ICD-10-CM | POA: Diagnosis not present

## 2018-04-18 DIAGNOSIS — G629 Polyneuropathy, unspecified: Secondary | ICD-10-CM | POA: Diagnosis not present

## 2018-04-18 DIAGNOSIS — I890 Lymphedema, not elsewhere classified: Secondary | ICD-10-CM | POA: Diagnosis not present

## 2018-04-18 DIAGNOSIS — Z9981 Dependence on supplemental oxygen: Secondary | ICD-10-CM | POA: Diagnosis not present

## 2018-04-18 DIAGNOSIS — Z7901 Long term (current) use of anticoagulants: Secondary | ICD-10-CM | POA: Diagnosis not present

## 2018-04-24 ENCOUNTER — Ambulatory Visit (INDEPENDENT_AMBULATORY_CARE_PROVIDER_SITE_OTHER): Payer: Medicare Other

## 2018-04-24 VITALS — BP 121/61 | HR 63 | Temp 97.2°F | Ht <= 58 in | Wt 180.0 lb

## 2018-04-24 DIAGNOSIS — J449 Chronic obstructive pulmonary disease, unspecified: Secondary | ICD-10-CM | POA: Diagnosis not present

## 2018-04-24 DIAGNOSIS — Z0000 Encounter for general adult medical examination without abnormal findings: Secondary | ICD-10-CM

## 2018-04-24 DIAGNOSIS — E538 Deficiency of other specified B group vitamins: Secondary | ICD-10-CM | POA: Diagnosis not present

## 2018-04-24 NOTE — Progress Notes (Signed)
Subjective:   [name] Walker is a 84 y.o. female who presents for Medicare Annual (Subsequent) preventive examination.  [name] Walker was married for many years before her losing her husband several years ago.  She has two children and one grandaughter who live close by.  She enjoys going to church when she can.  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    Review of Systems:   Cardiac Risk Factors include: advanced age (>30men, >5 women);hypertension;sedentary lifestyle     Objective:     Vitals: BP 121/61   Pulse 63   Temp (!) 97.2 F (36.2 C) (Oral)   Ht 4\' 9"  (1.448 m)   Wt 180 lb (81.6 kg)   BMI 38.95 kg/m   Body mass index is 38.95 kg/m.  Advanced Directives 04/24/2018 03/24/2018 03/07/2017 06/21/2016 06/12/2016 05/23/2016 05/23/2016  Does Patient Have a Medical Advance Directive? No No No Yes Yes Yes Yes  Type of Advance Directive - - Public librarian;Living will [place];Living will [place];Living will Living will;Healthcare Power of Attorney  Does patient want to make changes to medical advance directive? - - - No - Patient declined No - Patient declined No - Patient declined -  Copy of [place] in Chart? - - - No - copy requested No - copy requested No - copy requested -  Would patient like information on creating a medical advance directive? No - Patient declined - No - Patient declined - - - -  Pre-existing out of facility DNR order (yellow form or pink MOST form) - - - - - - -    Tobacco Social History   Tobacco Use  Smoking Status Never Smoker  Smokeless Tobacco Never Used     Clinical Intake:  Past Medical History:  Diagnosis Date  . Cataract   . COPD (chronic obstructive pulmonary disease) ([place])   . Depression   . DVT (deep venous thrombosis) ([place])   . Gastric erosion   . GERD (gastroesophageal reflux disease)   . Hypertension   . Insomnia   . Ischemic  colitis ([place])   . Osteoarthritis   . Parotid mass 2016  . 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 ABDOMINAL HYSTERECTOMY    . BACK SURGERY    . Cataracts    . CHOLECYSTECTOMY    . COLONOSCOPY    . ESOPHAGOGASTRODUODENOSCOPY  10/17/2007   erosion   . KNEE ARTHROPLASTY     bilateral  . PAROTIDECTOMY Left 05/17/2015   Procedure: PAROTIDECTOMY- LEFT TOTAL;  Surgeon: Leta Baptist, MD;  Location: [facility];  Service: ENT;  Laterality: Left;  . TOTAL HIP ARTHROPLASTY     bilateral   Family History  Problem Relation Age of Onset  . Rheum arthritis Mother   . Ulcers Mother        on foot  . Ulcers Father   . Cancer Sister        uterine   Social History   Socioeconomic History  . Marital status: Widowed    Spouse name: Not on file  . Number of children: Not on file  . Years of education: Not on file  . Highest education level: Not on file  Occupational History  . Not on file  Social Needs  . Financial resource strain: Not on file  . Food insecurity:  Worry: Not on file    Inability: Not on file  . Transportation needs:    Medical: Not on file    Non-medical: Not on file  Tobacco Use  . Smoking status: Never Smoker  . Smokeless tobacco: Never Used  Substance and Sexual Activity  . Alcohol use: No  . Drug use: No  . Sexual activity: Never    Birth control/protection: Surgical, Post-menopausal  Lifestyle  . Physical activity:    Days per week: Not on file    Minutes per session: Not on file  . Stress: Not on file  Relationships  . Social connections:    Talks on phone: Not on file    Gets together: Not on file    Attends religious service: Not on file    Active member of club or organization: Not on file    Attends meetings of clubs or organizations: Not on file    Relationship status: Not on file  Other Topics Concern  . Not on file  Social History Narrative  . Not  on file    Outpatient Encounter Medications as of 04/24/2018  Medication Sig  . acetaminophen (TYLENOL) 500 MG tablet Take 1,000 mg by mouth every 6 (six) hours as needed.  Marland Kitchen albuterol (PROVENTIL) (2.5 MG/3ML) 0.083% nebulizer solution Take 3 mLs (2.5 mg total) by nebulization every 2 (two) hours as needed for wheezing or shortness of breath.  Marland Kitchen amitriptyline (ELAVIL) 75 MG tablet TAKE 1 TABLET AT BEDTIME  . Calcium Carbonate-Vitamin D (CALCIUM-VITAMIN D) 500-200 MG-UNIT tablet Take 1 tablet by mouth every morning.  . citalopram (CELEXA) 20 MG tablet TAKE 1 TABLET DAILY  . donepezil (ARICEPT) 10 MG tablet TAKE ONE TABLET AT BEDTIME  . ELIQUIS 5 MG TABS tablet TAKE (1) TABLET TWICE A DAY.  . furosemide (LASIX) 40 MG tablet TAKE (2) TABLETS DAILY  . gabapentin (NEURONTIN) 300 MG capsule TAKE (1) CAPSULE TWICE DAILY.  . hydrOXYzine (ATARAX/VISTARIL) 10 MG tablet TAKE (1) TABLET THREE TIMES DAILY.  Marland Kitchen ketoconazole (NIZORAL) 2 % cream Apply 1 application topically daily as needed.   Marland Kitchen levocetirizine (XYZAL) 5 MG tablet Take 1 tablet (5 mg total) by mouth at bedtime.  Marland Kitchen omeprazole (PRILOSEC) 20 MG capsule TAKE 2 CAPSULES DAILY AS NEEDED FOR REFLUX  . OXYGEN Inhale into the lungs at bedtime.  . potassium chloride (K-DUR,KLOR-CON) 10 MEQ tablet TAKE 4 TABLETS ONCE DAILY  . rOPINIRole (REQUIP) 1 MG tablet TAKE 1 OR 2 TABLETS AT BEDTIME  . triamcinolone cream (KENALOG) 0.1 % Apply 1 application topically 2 (two) times daily.   . verapamil (CALAN-SR) 180 MG CR tablet TAKE ONE TABLET AT BEDTIME  . Vitamin D-Vitamin K (VITAMIN K2-VITAMIN D3 PO) Take 2 capsules by mouth every morning.   Facility-Administered Encounter Medications as of 04/24/2018  Medication  . cyanocobalamin ((VITAMIN B-12)) injection 1,000 mcg    Activities of Daily Living In your present state of health, do you have any difficulty performing the following activities: 04/24/2018 03/11/2018  Hearing? N N  Vision? N N  Difficulty  concentrating or making decisions? N N  Walking or climbing stairs? Y Y  Comment uses a walker -  Dressing or bathing? N N  Doing errands, shopping? [name]  Comment normally one of her children or one of her neighbors will accompany her on errands and doctors visits -  Preparing Food and eating ? N -  Comment does some cooking in the microwave and crockpot, eats out a couple of  times per week -  Using the Toilet? N -  In the past six months, have you accidently leaked urine? Y -  Comment reports occasional leakage when she is unable to get to the restroom -  Do you have problems with loss of bowel control? N -  Managing your Medications? Y -  Comment medication is delivered in pill packs by [facility] your Finances? Y -  Comment granddaughter handles all of her finances, paying her bills -  Housekeeping or managing your Housekeeping? N -  Some recent data might be hidden   Mrs. Deboard does report difficulty in walking sometimes because of edema in her legs.  She uses a walker wherever she goes.  Her daughter and her grandauighter help her with running errands, grocery shopping, meal preparation, and her finances.  She gets her medications from [facility] and they come to her in pill packs so that she is able to administer them herself.  Because of some urinary leakage, she does wear Depends all of the time.  Patient Care Team: Claretta Fraise, MD as PCP - General (Family Medicine) Monna Fam, MD as Consulting Physician (Ophthalmology) Eppie Gibson, MD as Attending Physician (Radiation Oncology) Leta Baptist, MD as Consulting Physician (Otolaryngology)    Assessment:   This is a routine wellness examination for [facility].  Exercise Activities and Dietary recommendations Current Exercise Habits: The patient does not participate in regular exercise at present, Exercise limited by: cardiac condition(s);respiratory conditions(s)  Goals    . DIET - EAT MORE FRUITS  AND VEGETABLES    . LIFESTYLE - DECREASE FALLS RISK       Fall Risk Fall Risk  04/24/2018 03/31/2018 03/11/2018 03/03/2018 12/02/2017  Falls in the past year? Yes Yes Yes Yes No  Number falls in past yr: 1 1 2  or more 1 -  Injury with Fall? No No No No -  Risk Factor Category  - - High Fall Risk - -  Risk for fall due to : - - History of fall(s);Impaired balance/gait;Impaired mobility Impaired mobility -  Risk for fall due to: Comment - - - - -  Follow up - - - - -   Is the patient's home free of loose throw rugs in walkways, pet beds, electrical cords, etc?   Yes      Grab bars in the bathroom? Yes      Handrails on the stairs?   Yes      Adequate lighting?   Yes   Depression Screen PHQ 2/9 Scores 04/24/2018 03/31/2018 03/11/2018 03/03/2018  PHQ - 2 Score 0 0 0 0  PHQ- 9 Score - - - -     Cognitive Function MMSE - Mini Mental State Exam 04/24/2018 05/13/2015  Orientation to time 4 5  Orientation to Place 4 5  Registration 2 3  Attention/ Calculation 4 5  Recall 2 3  Language- name 2 objects 2 2  Language- repeat 1 1  Language- follow 3 step command 3 2  Language- read & follow direction 1 1  Write a sentence 1 1  Copy design 1 0  Total score 25 28    Patient performed well on the MMSE, scoring 25 out of 30 available points.   Immunization History  Administered Date(s) Administered  . Influenza,inj,Quad PF,6+ Mos 07/15/2014, 07/13/2016  . Pneumococcal Conjugate-13 07/15/2014  . Tdap 05/07/2012, 03/07/2017  . Zoster 05/07/2014    Qualifies for Shingles Vaccine?  Refused  Screening Tests Health  Maintenance  Topic Date Due  . PNA vac Low Risk Adult (2 of 2 - PPSV23) 07/25/2018 (Originally 07/16/2015)  . INFLUENZA VACCINE  08/04/2018 (Originally 04/24/2018)  . TETANUS/TDAP  03/08/2027  . DEXA SCAN  Completed   Patient refused to have Pneumovax vaccine today.  Cancer Screenings: Lung: Low Dose CT Chest recommended if Age 94-80 years, 30 pack-year currently smoking OR have  quit w/in 15years. Patient does not qualify Breast:  Up to date on Mammogram? Yes Up to date of Bone Density/Dexa? No, does not want today Colorectal: Yes      Plan:   Follow up with PCP for scheduled office visits and as needed.  I have personally reviewed and noted the following in the patient's chart:   . Medical and social history . Use of alcohol, tobacco or illicit drugs  . Current medications and supplements . Functional ability and status . Nutritional status . Physical activity . Advanced directives . List of other physicians . Hospitalizations, surgeries, and ER visits in previous 12 months . Vitals . Screenings to include cognitive, depression, and falls . Referrals and appointments  In addition, I have reviewed and discussed with patient certain preventive protocols, quality metrics, and best practice recommendations. A written personalized care plan for preventive services as well as general preventive health recommendations were provided to patient.     Burnadette Pop, LPN  05/31/4162

## 2018-04-24 NOTE — Patient Instructions (Signed)
  Ms. Delude , Thank you for taking time to come for your Medicare Wellness Visit. I appreciate your ongoing commitment to your health goals. Please review the following plan we discussed and let me know if I can assist you in the future.   These are the goals we discussed: Goals    . DIET - EAT MORE FRUITS AND VEGETABLES    . LIFESTYLE - DECREASE FALLS RISK       This is a list of the screening recommended for you and due dates:  Health Maintenance  Topic Date Due  . Pneumonia vaccines (2 of 2 - PPSV23) 07/25/2018*  . Flu Shot  08/04/2018*  . Tetanus Vaccine  03/08/2027  . DEXA scan (bone density measurement)  Completed  *Topic was postponed. The date shown is not the original due date.

## 2018-04-25 DIAGNOSIS — Z9981 Dependence on supplemental oxygen: Secondary | ICD-10-CM | POA: Diagnosis not present

## 2018-04-25 DIAGNOSIS — Z7901 Long term (current) use of anticoagulants: Secondary | ICD-10-CM | POA: Diagnosis not present

## 2018-04-25 DIAGNOSIS — Z9181 History of falling: Secondary | ICD-10-CM | POA: Diagnosis not present

## 2018-04-25 DIAGNOSIS — I890 Lymphedema, not elsewhere classified: Secondary | ICD-10-CM | POA: Diagnosis not present

## 2018-04-25 DIAGNOSIS — G629 Polyneuropathy, unspecified: Secondary | ICD-10-CM | POA: Diagnosis not present

## 2018-04-28 ENCOUNTER — Ambulatory Visit: Payer: Medicare Other | Admitting: *Deleted

## 2018-05-02 ENCOUNTER — Telehealth: Payer: Self-pay | Admitting: *Deleted

## 2018-05-02 NOTE — Telephone Encounter (Signed)
VM rcvd from [facility] with [facility] FYI patients daughter refused PT yesterday d/t shoulder pain Has rescheduled for next week then discharged.

## 2018-05-08 ENCOUNTER — Telehealth: Payer: Self-pay

## 2018-05-08 DIAGNOSIS — Z9181 History of falling: Secondary | ICD-10-CM | POA: Diagnosis not present

## 2018-05-08 DIAGNOSIS — Z7901 Long term (current) use of anticoagulants: Secondary | ICD-10-CM | POA: Diagnosis not present

## 2018-05-08 DIAGNOSIS — J449 Chronic obstructive pulmonary disease, unspecified: Secondary | ICD-10-CM | POA: Diagnosis not present

## 2018-05-08 DIAGNOSIS — Z9981 Dependence on supplemental oxygen: Secondary | ICD-10-CM | POA: Diagnosis not present

## 2018-05-08 DIAGNOSIS — R531 Weakness: Secondary | ICD-10-CM | POA: Diagnosis not present

## 2018-05-08 DIAGNOSIS — I890 Lymphedema, not elsewhere classified: Secondary | ICD-10-CM | POA: Diagnosis not present

## 2018-05-08 DIAGNOSIS — R600 Localized edema: Secondary | ICD-10-CM | POA: Diagnosis not present

## 2018-05-08 DIAGNOSIS — I482 Chronic atrial fibrillation: Secondary | ICD-10-CM | POA: Diagnosis not present

## 2018-05-08 DIAGNOSIS — M150 Primary generalized (osteo)arthritis: Secondary | ICD-10-CM | POA: Diagnosis not present

## 2018-05-08 DIAGNOSIS — G629 Polyneuropathy, unspecified: Secondary | ICD-10-CM | POA: Diagnosis not present

## 2018-05-08 NOTE — Telephone Encounter (Signed)
FYI Patient has increased pain in right shoulder  Advised patient to make appt.  She refused

## 2018-05-12 ENCOUNTER — Other Ambulatory Visit: Payer: Self-pay

## 2018-05-12 NOTE — Patient Outreach (Addendum)
[facility]) Care Management  05/12/2018  [name] Walker 1925-06-17 [id number]   Referral Date: 05/12/18 Referral Source: EMMI prevent Referral Reason: Engagement tool   Outreach Attempt: spoke with patient.  She is able to verify HIPAA.  Patient states that she lives alone but has family that lives close by and helps patient.  Patient states she prefers to live alone.  Patient reports that she has had multiple falls.  Most recent fall last month per patient.  Patient reports that her left leg gives out on her frequently causing her falls.  Patient uses walker for ambulation around the house.  Patient has a medical alert around her neck per patient.     Patient has history of COPD and A. Fibrillation.  Patient denies any problems with heart and breathing presently.     Patient takes her medications as prescribed.  She states she has pill packs for her medications.  Patient denies any problems with affording medications.      Consent: Discussed with patient [facility] services.  Patient agrees to services.  Advised patient that she will need to answer call when nurse calls to set up home visit.  She verbalized understanding and states she sometimes just does not answer due to telemarketers.     Plan: RN CM will refer to community nurse for further assessment/ home evaluation due to multiple falls.      Jone Baseman, RN, MSN [facility] Care Management Care Management Coordinator Direct Line [phone] Toll Free: [phone]  Fax: [phone]

## 2018-05-15 ENCOUNTER — Other Ambulatory Visit: Payer: Self-pay | Admitting: *Deleted

## 2018-05-15 NOTE — Patient Outreach (Signed)
Telephone call to patient to schedule initial home visit, spoke with [name] Walker, HIPAA verified, home visit scheduled for next week.  PLAN See [name] Walker for initial home visit next week  [name] [facility], [place] Coordinator [phone]

## 2018-05-19 ENCOUNTER — Other Ambulatory Visit: Payer: Self-pay | Admitting: Family Medicine

## 2018-05-20 ENCOUNTER — Encounter: Payer: Self-pay | Admitting: *Deleted

## 2018-05-20 ENCOUNTER — Ambulatory Visit: Payer: Medicare Other | Admitting: Family Medicine

## 2018-05-20 ENCOUNTER — Ambulatory Visit: Payer: Medicare Other | Admitting: Pediatrics

## 2018-05-20 ENCOUNTER — Other Ambulatory Visit: Payer: Self-pay | Admitting: *Deleted

## 2018-05-20 NOTE — Patient Outreach (Signed)
[facility]) Care Management   05/20/2018  [name] Walker 28-May-1925 [id number]  [name] Walker is an 84 y.o. female  Subjective: Routine home visit with pt, HIPAA verified, pt reports she falls because " my legs give way and crumble and my legs and feet are numb" pt reports she has walker, riser on commode, had shower seat and daughter getting pt another one.  Pt states she does not give permission for RN CM to speak with her family as there has been issues " of [place] not getting along"  Pt reports she has a friend that assists her with transportation as well as daughter does assist at times as well as granddaughter handles finances.  Pt states she is not interested in another level of care, is not leaving her home as she has lived in home 34 years, does not qualify for medicaid and has no resources to pay hired caregiver. (previously had [place])  Pt states [facility] just recently discharged. Pt states " I don't qualify for medicaid because I get a pension and social security"  Objective:   Vitals:   05/20/18 1408  BP: 114/60  Pulse: 70  Resp: 18  SpO2: 97%  Weight: 177 lb (80.3 kg)  Height: 1.448 m (4\' 9" )   ROS  Physical Exam  Constitutional: She is oriented to person, place, and time. She appears well-developed.  HENT:  Head: Normocephalic.  Neck: Normal range of motion. Neck supple.  Cardiovascular: Normal rate.  Respiratory: Effort normal and breath sounds normal.  GI: Soft. Bowel sounds are normal.  Musculoskeletal: Normal range of motion. She exhibits edema.  2+ edema LE BL from knees to feet  Neurological: She is alert and oriented to person, place, and time.  Pt oriented today  Skin: Skin is warm and dry.  Psychiatric: She has a normal mood and affect. Her behavior is normal. Thought content normal.    Encounter Medications:   Outpatient Encounter Medications as of 05/20/2018  Medication Sig Note  . acetaminophen (TYLENOL) 500 MG  tablet Take 1,000 mg by mouth every 6 (six) hours as needed.   Marland Kitchen amitriptyline (ELAVIL) 75 MG tablet TAKE 1 TABLET AT BEDTIME   . Calcium Carbonate-Vitamin D (CALCIUM-VITAMIN D) 500-200 MG-UNIT tablet Take 1 tablet by mouth every morning.   . donepezil (ARICEPT) 10 MG tablet TAKE ONE TABLET AT BEDTIME   . ELIQUIS 5 MG TABS tablet TAKE (1) TABLET TWICE A DAY.   . furosemide (LASIX) 40 MG tablet TAKE (2) TABLETS DAILY   . gabapentin (NEURONTIN) 300 MG capsule TAKE (1) CAPSULE TWICE DAILY.   Marland Kitchen levocetirizine (XYZAL) 5 MG tablet Take 1 tablet (5 mg total) by mouth at bedtime.   Marland Kitchen omeprazole (PRILOSEC) 20 MG capsule TAKE 2 CAPSULES DAILY AS NEEDED FOR REFLUX   . OXYGEN Inhale into the lungs at bedtime.   . potassium chloride (K-DUR) 10 MEQ tablet TAKE 4 TABLETS ONCE DAILY   . rOPINIRole (REQUIP) 1 MG tablet TAKE 1 OR 2 TABLETS AT BEDTIME   . triamcinolone cream (KENALOG) 0.1 % Apply 1 application topically 2 (two) times daily.    . verapamil (CALAN-SR) 180 MG CR tablet TAKE ONE TABLET AT BEDTIME   . Vitamin D-Vitamin K (VITAMIN K2-VITAMIN D3 PO) Take 2 capsules by mouth every morning.   Marland Kitchen albuterol (PROVENTIL) (2.5 MG/3ML) 0.083% nebulizer solution Take 3 mLs (2.5 mg total) by nebulization every 2 (two) hours as needed for wheezing or shortness of breath. (  Patient not taking: Reported on 05/20/2018) 05/20/2018: Pt reports she has not used in months  . citalopram (CELEXA) 20 MG tablet TAKE 1 TABLET DAILY (Patient not taking: Reported on 05/20/2018) 05/20/2018: Medication is not in prefilled med packaging  . hydrOXYzine (ATARAX/VISTARIL) 10 MG tablet TAKE (1) TABLET THREE TIMES DAILY. (Patient not taking: Reported on 05/20/2018)   . ketoconazole (NIZORAL) 2 % cream Apply 1 application topically daily as needed.     Facility-Administered Encounter Medications as of 05/20/2018  Medication  . cyanocobalamin ((VITAMIN B-12)) injection 1,000 mcg    Functional Status:   In your present state of health, do you  have any difficulty performing the following activities: 05/20/2018 04/24/2018  Hearing? N N  Vision? N N  Comment wears reading glasses -  Difficulty concentrating or making decisions? Y N  Comment pt has forgetfulness, on aricept -  Walking or climbing stairs? Y Y  Comment - uses a walker  Dressing or bathing? Y N  Doing errands, shopping? Y Y  Comment - normally one of her children or one of her neighbors will accompany her on errands and doctors visits  Preparing Food and eating ? N N  Comment - does some cooking in the microwave and crockpot, eats out a couple of times per week  Using the Toilet? N N  In the past six months, have you accidently leaked urine? Y Y  Comment - reports occasional leakage when she is unable to get to the restroom  Do you have problems with loss of bowel control? N N  Managing your Medications? N Y  Comment meds are in prefilled packs from pharmacy medication is delivered in pill packs by [facility] your Finances? Y Y  Comment - granddaughter handles all of her finances, paying her bills  Housekeeping or managing your Housekeeping? Y N  Some recent data might be hidden    Fall/Depression Screening:    Fall Risk  05/20/2018 04/24/2018 03/31/2018  Falls in the past year? Yes Yes Yes  Number falls in past yr: 2 or more 1 1  Injury with Fall? Yes No No  Risk Factor Category  High Fall Risk - -  Risk for fall due to : History of fall(s);Medication side effect;Impaired balance/gait - -  Risk for fall due to: Comment - - -  Follow up Falls evaluation completed;Falls prevention discussed - -   PHQ 2/9 Scores 05/20/2018 05/12/2018 04/24/2018 03/31/2018 03/11/2018 03/03/2018 12/02/2017  PHQ - 2 Score 0 0 0 0 0 0 0  PHQ- 9 Score - - - - - - -    Assessment:  Upon arrival to home, pt sitting at kitchen table but does not have walker near her, pt has several walkers in the home,  RN CM instructed pt to keep walker near her to use at all times, pt has LIfe  Alert necklace, RN CM ask pt not to take shower without someone present, RN CM unable to communicate with family as pt does not give permission, consent form on file and pt does not wish to make changes.  RN CM observed and reviewed prefilled med packs, pt looks after taking her own medication.  Pt talks non stop during entire visit, RN CM has difficulty understanding pt at times.  Pt not willing to make changes at present such as down sizing, senior apartments or letting RN CM talk with family about fall issues. Pt cites that she makes her own decisions and does not like  people telling her what to do and that is why her family is upset with her (because pt chooses to stay in her current circumstances)  Pt goal is "to not fall and stay in my home"  Pt has new Hover round and is also using during the visit.  RN CM faxed barrier letter and initial home visit to primary MD office.  Plan: call pt next month Reinforce safety precautions  [name] [facility], [place] Coordinator [phone]

## 2018-05-25 DIAGNOSIS — J449 Chronic obstructive pulmonary disease, unspecified: Secondary | ICD-10-CM | POA: Diagnosis not present

## 2018-05-28 ENCOUNTER — Other Ambulatory Visit: Payer: Self-pay | Admitting: Family Medicine

## 2018-05-29 ENCOUNTER — Ambulatory Visit (INDEPENDENT_AMBULATORY_CARE_PROVIDER_SITE_OTHER): Payer: Medicare Other

## 2018-05-29 ENCOUNTER — Encounter: Payer: Self-pay | Admitting: Family Medicine

## 2018-05-29 ENCOUNTER — Ambulatory Visit (INDEPENDENT_AMBULATORY_CARE_PROVIDER_SITE_OTHER): Payer: Medicare Other | Admitting: Family Medicine

## 2018-05-29 VITALS — BP 150/74 | HR 68 | Temp 97.0°F | Ht <= 58 in | Wt 180.0 lb

## 2018-05-29 DIAGNOSIS — M79601 Pain in right arm: Secondary | ICD-10-CM

## 2018-05-29 DIAGNOSIS — M159 Polyosteoarthritis, unspecified: Secondary | ICD-10-CM

## 2018-05-29 DIAGNOSIS — R296 Repeated falls: Secondary | ICD-10-CM | POA: Diagnosis not present

## 2018-05-29 DIAGNOSIS — I10 Essential (primary) hypertension: Secondary | ICD-10-CM | POA: Diagnosis not present

## 2018-05-29 DIAGNOSIS — J40 Bronchitis, not specified as acute or chronic: Secondary | ICD-10-CM

## 2018-05-29 DIAGNOSIS — M150 Primary generalized (osteo)arthritis: Secondary | ICD-10-CM

## 2018-05-29 DIAGNOSIS — J329 Chronic sinusitis, unspecified: Secondary | ICD-10-CM

## 2018-05-29 DIAGNOSIS — I482 Chronic atrial fibrillation, unspecified: Secondary | ICD-10-CM

## 2018-05-29 DIAGNOSIS — M19011 Primary osteoarthritis, right shoulder: Secondary | ICD-10-CM | POA: Diagnosis not present

## 2018-05-29 MED ORDER — TRAMADOL HCL 50 MG PO TABS: 50.0000 mg | ORAL_TABLET | Freq: Four times a day (QID) | ORAL | 0 refills | Status: AC

## 2018-05-29 MED ORDER — PREDNISONE 10 MG (48) PO TBPK: ORAL_TABLET | ORAL | 0 refills | Status: DC

## 2018-05-29 NOTE — Progress Notes (Signed)
Subjective:  Patient ID: [name] Walker, female    DOB: 1925-08-13  Age: 84 y.o. MRN: [id number]  CC: Shoulder Pain (Right. [place] in July)   HPI LATESHIA SCHMOKER presents for  follow-up of hypertension. Patient has no history of headache chest pain or shortness of breath or recent cough. Patient also denies symptoms of TIA such as focal numbness or weakness. Patient denies side effects from medication. States taking it regularly.   Patient in for follow-up of atrial fibrillation. Patient denies any recent bouts of chest pain or palpitations. Additionally, patient is taking anticoagulants. Patient denies any recent excessive bleeding episodes including epistaxis, bleeding from the gums, genitalia, rectal bleeding or hematuria. Additionally there has been no excessive bruising.  Had a fall about 6-8 weeks ago. Never had it checked. Still very painful. Wearing a makeshift sling of coban today.  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   She has a past surgical history that includes Total hip arthroplasty; Knee Arthroplasty; Esophagogastroduodenoscopy (10/17/2007); Colonoscopy; Back surgery; Abdominal hysterectomy; Cholecystectomy; Cataracts; and Parotidectomy (Left, 05/17/2015).   Her family history includes Cancer in her sister; Rheum arthritis in her mother; Ulcers in her father and mother.She reports that she has never smoked. She has never used smokeless tobacco. She reports that she does not drink alcohol or use drugs.  Current Outpatient Medications on File Prior to Visit  Medication Sig Dispense Refill  . acetaminophen (TYLENOL) 500 MG tablet Take 1,000 mg by  mouth every 6 (six) hours as needed.    Marland Kitchen amitriptyline (ELAVIL) 75 MG tablet TAKE 1 TABLET AT BEDTIME 30 tablet 1  . Calcium Carbonate-Vitamin D (CALCIUM-VITAMIN D) 500-200 MG-UNIT tablet Take 1 tablet by mouth every morning.    . citalopram (CELEXA) 20 MG tablet TAKE 1 TABLET DAILY 30 tablet 2  . donepezil (ARICEPT) 10 MG tablet TAKE ONE TABLET AT BEDTIME 90 tablet 1  . ELIQUIS 5 MG TABS tablet TAKE (1) TABLET TWICE A DAY. 60 tablet 5  . furosemide (LASIX) 40 MG tablet TAKE (2) TABLETS DAILY 60 tablet 4  . gabapentin (NEURONTIN) 300 MG capsule TAKE (1) CAPSULE TWICE DAILY. 60 capsule 2  . hydrOXYzine (ATARAX/VISTARIL) 10 MG tablet TAKE (1) TABLET THREE TIMES DAILY. 90 tablet 2  . ketoconazole (NIZORAL) 2 % cream Apply 1 application topically daily as needed.     Marland Kitchen levocetirizine (XYZAL) 5 MG tablet Take 1 tablet (5 mg total) by mouth at bedtime. 90 tablet 3  . omeprazole (PRILOSEC) 20 MG capsule TAKE 2 CAPSULES DAILY AS NEEDED FOR REFLUX 60 capsule 0  . OXYGEN Inhale into the lungs at bedtime.    . potassium chloride (K-DUR) 10 MEQ tablet TAKE 4 TABLETS ONCE DAILY 120 tablet 3  . rOPINIRole (REQUIP) 1 MG tablet TAKE 1 OR 2 TABLETS AT BEDTIME 60 tablet 4  . triamcinolone cream (KENALOG) 0.1 % Apply 1 application topically 2 (two) times daily.     . verapamil (CALAN-SR) 180 MG CR tablet TAKE ONE TABLET AT BEDTIME 30 tablet 3  . Vitamin D-Vitamin K (VITAMIN K2-VITAMIN D3 PO) Take 2 capsules by mouth every morning.    Marland Kitchen albuterol (PROVENTIL) (2.5 MG/3ML) 0.083% nebulizer solution Take 3 mLs (2.5 mg total) by nebulization every 2 (two) hours as needed  for wheezing or shortness of breath. (Patient not taking: Reported on 05/29/2018) 75 mL 12   Current Facility-Administered Medications on File Prior to Visit  Medication Dose Route Frequency Provider Last Rate Last Dose  . cyanocobalamin ((VITAMIN B-12)) injection 1,000 mcg  1,000 mcg Intramuscular Q30 days Claretta Fraise, MD   1,000 mcg at 04/24/18  1154    ROS Review of Systems  Constitutional: Negative.   HENT: Negative.   Eyes: Negative for visual disturbance.  Respiratory: Negative for shortness of breath.   Cardiovascular: Negative for chest pain.  Gastrointestinal: Negative for abdominal pain.  Musculoskeletal: Positive for arthralgias.    Objective:  BP (!) 150/74   Pulse 68   Temp (!) 97 F (36.1 C) (Oral)   Ht 4\' 9"  (1.448 m)   Wt 180 lb (81.6 kg)   BMI 38.95 kg/m   BP Readings from Last 3 Encounters:  05/29/18 (!) 150/74  05/20/18 114/60  04/24/18 121/61    Wt Readings from Last 3 Encounters:  05/29/18 180 lb (81.6 kg)  05/20/18 177 lb (80.3 kg)  04/24/18 180 lb (81.6 kg)     Physical Exam  Constitutional: She is oriented to person, place, and time. She appears well-developed and well-nourished. No distress.  Cardiovascular: Normal rate and regular rhythm.  Pulmonary/Chest: Breath sounds normal.  Musculoskeletal: She exhibits tenderness (palpation of ball of rigth shoulder. Tender, painful for all ROM, RUE).  Neurological: She is alert and oriented to person, place, and time.  Skin: Skin is warm and dry.  Psychiatric: She has a normal mood and affect.    XR - moderately severe osteoarthric changes noted on preliminary review by Lindey Renzulli.   Assessment & Plan:   Natahlia was seen today for shoulder pain.  Diagnoses and all orders for this visit:  Pain of right upper extremity -     DG Humerus Right; Future -     Ambulatory referral to Physical Therapy  Primary osteoarthritis involving multiple joints  Frequent falls  Essential hypertension  Atrial fibrillation, chronic (HCC)  Sinobronchitis  Other orders -     traMADol (ULTRAM) 50 MG tablet; Take 1 tablet (50 mg total) by mouth 4 (four) times daily for 5 days. 1-2 tablets up to 4 times a day as needed for pain -     predniSONE (STERAPRED UNI-PAK 48 TAB) 10 MG (48) TBPK tablet; Use as directed   Allergies as of 05/29/2018       Reactions   Diltiazem Anxiety, Other (See Comments)   Sleep disturbance   Iohexol     Desc: THROAT SWELLING-NOTED IN CHART AFTER IVC PLACEMENT-ARS 12-02-05   Latex Other (See Comments)   unknown   Penicillins Swelling   Patient was given zosyn this admission and tolerated. After d/w MD and pt, she had swelling in arm only, so possible just infiltration and not allergic. ABx changed to Cefazolin and is tolerating   Shellfish Allergy Other (See Comments)   Reaction unknown      Medication List        Accurate as of 05/29/18 11:59 PM. Always use your most recent med list.          acetaminophen 500 MG tablet Commonly known as:  TYLENOL Take 1,000 mg by mouth every 6 (six) hours as needed.   albuterol (2.5 MG/3ML) 0.083% nebulizer solution Commonly known as:  PROVENTIL Take 3 mLs (2.5 mg total) by nebulization every 2 (two) hours as needed for wheezing or shortness of breath.  amitriptyline 75 MG tablet Commonly known as:  ELAVIL TAKE 1 TABLET AT BEDTIME   calcium-vitamin D 500-200 MG-UNIT tablet Take 1 tablet by mouth every morning.   citalopram 20 MG tablet Commonly known as:  CELEXA TAKE 1 TABLET DAILY   donepezil 10 MG tablet Commonly known as:  ARICEPT TAKE ONE TABLET AT BEDTIME   ELIQUIS 5 MG Tabs tablet Generic drug:  apixaban TAKE (1) TABLET TWICE A DAY.   furosemide 40 MG tablet Commonly known as:  LASIX TAKE (2) TABLETS DAILY   gabapentin 300 MG capsule Commonly known as:  NEURONTIN TAKE (1) CAPSULE TWICE DAILY.   hydrOXYzine 10 MG tablet Commonly known as:  ATARAX/VISTARIL TAKE (1) TABLET THREE TIMES DAILY.   ketoconazole 2 % cream Commonly known as:  NIZORAL Apply 1 application topically daily as needed.   levocetirizine 5 MG tablet Commonly known as:  XYZAL Take 1 tablet (5 mg total) by mouth at bedtime.   omeprazole 20 MG capsule Commonly known as:  PRILOSEC TAKE 2 CAPSULES DAILY AS NEEDED FOR REFLUX   OXYGEN Inhale into the lungs at  bedtime.   potassium chloride 10 MEQ tablet Commonly known as:  K-DUR TAKE 4 TABLETS ONCE DAILY   predniSONE 10 MG (48) Tbpk tablet Commonly known as:  STERAPRED UNI-PAK 48 TAB Use as directed   rOPINIRole 1 MG tablet Commonly known as:  REQUIP TAKE 1 OR 2 TABLETS AT BEDTIME   traMADol 50 MG tablet Commonly known as:  ULTRAM Take 1 tablet (50 mg total) by mouth 4 (four) times daily for 5 days. 1-2 tablets up to 4 times a day as needed for pain   triamcinolone cream 0.1 % Commonly known as:  KENALOG Apply 1 application topically 2 (two) times daily.   verapamil 180 MG CR tablet Commonly known as:  CALAN-SR TAKE ONE TABLET AT BEDTIME   VITAMIN K2-VITAMIN D3 PO Take 2 capsules by mouth every morning.       Meds ordered this encounter  Medications  . traMADol (ULTRAM) 50 MG tablet    Sig: Take 1 tablet (50 mg total) by mouth 4 (four) times daily for 5 days. 1-2 tablets up to 4 times a day as needed for pain    Dispense:  30 tablet    Refill:  0  . predniSONE (STERAPRED UNI-PAK 48 TAB) 10 MG (48) TBPK tablet    Sig: Use as directed    Dispense:  48 tablet    Refill:  0    A proper sling was dispnsed and fitted by nurse.   Follow-up: Return in about 1 month (around 06/28/2018), or if symptoms worsen or fail to improve.  Claretta Fraise, M.D.

## 2018-06-01 ENCOUNTER — Encounter: Payer: Self-pay | Admitting: Family Medicine

## 2018-06-04 ENCOUNTER — Encounter: Payer: Self-pay | Admitting: Family Medicine

## 2018-06-04 ENCOUNTER — Ambulatory Visit (INDEPENDENT_AMBULATORY_CARE_PROVIDER_SITE_OTHER): Payer: Medicare Other | Admitting: Family Medicine

## 2018-06-04 VITALS — BP 136/59 | HR 68 | Temp 97.7°F | Ht <= 58 in | Wt 175.5 lb

## 2018-06-04 DIAGNOSIS — M79673 Pain in unspecified foot: Secondary | ICD-10-CM | POA: Diagnosis not present

## 2018-06-04 DIAGNOSIS — J449 Chronic obstructive pulmonary disease, unspecified: Secondary | ICD-10-CM | POA: Diagnosis not present

## 2018-06-04 DIAGNOSIS — M25511 Pain in right shoulder: Secondary | ICD-10-CM

## 2018-06-04 DIAGNOSIS — D6489 Other specified anemias: Secondary | ICD-10-CM

## 2018-06-04 DIAGNOSIS — F339 Major depressive disorder, recurrent, unspecified: Secondary | ICD-10-CM

## 2018-06-04 DIAGNOSIS — I482 Chronic atrial fibrillation, unspecified: Secondary | ICD-10-CM

## 2018-06-04 DIAGNOSIS — G4700 Insomnia, unspecified: Secondary | ICD-10-CM

## 2018-06-04 DIAGNOSIS — M150 Primary generalized (osteo)arthritis: Secondary | ICD-10-CM | POA: Diagnosis not present

## 2018-06-04 DIAGNOSIS — M159 Polyosteoarthritis, unspecified: Secondary | ICD-10-CM

## 2018-06-04 DIAGNOSIS — E538 Deficiency of other specified B group vitamins: Secondary | ICD-10-CM

## 2018-06-04 DIAGNOSIS — I10 Essential (primary) hypertension: Secondary | ICD-10-CM

## 2018-06-04 MED ORDER — CITALOPRAM HYDROBROMIDE 20 MG PO TABS: 20.0000 mg | ORAL_TABLET | Freq: Every day | ORAL | 1 refills | Status: DC

## 2018-06-04 MED ORDER — LEVOCETIRIZINE DIHYDROCHLORIDE 5 MG PO TABS: 5.0000 mg | ORAL_TABLET | Freq: Every day | ORAL | 3 refills | Status: DC

## 2018-06-04 MED ORDER — VERAPAMIL HCL ER 180 MG PO TBCR: 180.0000 mg | EXTENDED_RELEASE_TABLET | Freq: Every day | ORAL | 1 refills | Status: DC

## 2018-06-04 MED ORDER — HYDROXYZINE HCL 10 MG PO TABS: ORAL_TABLET | ORAL | 5 refills | Status: DC

## 2018-06-04 MED ORDER — DONEPEZIL HCL 10 MG PO TABS: 10.0000 mg | ORAL_TABLET | Freq: Every day | ORAL | 1 refills | Status: DC

## 2018-06-04 MED ORDER — OMEPRAZOLE 20 MG PO CPDR: DELAYED_RELEASE_CAPSULE | ORAL | 1 refills | Status: DC

## 2018-06-04 MED ORDER — AMITRIPTYLINE HCL 75 MG PO TABS: 75.0000 mg | ORAL_TABLET | Freq: Every day | ORAL | 1 refills | Status: DC

## 2018-06-04 MED ORDER — FUROSEMIDE 40 MG PO TABS: ORAL_TABLET | ORAL | 5 refills | Status: DC

## 2018-06-04 MED ORDER — ROPINIROLE HCL 1 MG PO TABS: ORAL_TABLET | ORAL | 1 refills | Status: DC

## 2018-06-04 MED ORDER — GABAPENTIN 300 MG PO CAPS: ORAL_CAPSULE | ORAL | 1 refills | Status: DC

## 2018-06-04 NOTE — Progress Notes (Signed)
Subjective:  Patient ID: [name] Walker, female    DOB: 12-26-1924  Age: 84 y.o. MRN: [id number]  CC: Medical Management of Chronic Issues   HPI ALYZE LAUF presents for recheck of her right shoulder.  She is wearing the sling as much as she can but it inhibits her ability to maneuver her walker so she cannot use it while ambulating.  Additionally she is seen today due to arthritis involving the hips knees and ankles as well as the back.  Unfortunately although she is alert and oriented her speech is very much garbled due to being edentulous plus previous carotid surgery.  Her depression symptoms seem to have cleared.  This is based on interview as well as the PHQ score noted below.  She is having significant energy issues.  And she does have a history of anemia.  She is also been found to be deficient in vitamin B12.Her insomnia appears to be in remission.  She does state that she is having pain in her feet that is recurrent and she has seen a podiatrist in the past.  Depression screen [facility] 2/9 06/04/2018 05/29/2018 05/20/2018  Decreased Interest 3 0 0  Down, Depressed, Hopeless 0 0 0  PHQ - 2 Score 3 0 0  Altered sleeping 0 - -  Tired, decreased energy 3 - -  Change in appetite 0 - -  Feeling bad or failure about yourself  0 - -  Trouble concentrating 0 - -  Moving slowly or fidgety/restless 0 - -  Suicidal thoughts 0 - -  PHQ-9 Score 6 - -  Difficult doing work/chores - - -  Some recent data might be hidden    History Rucha has a past medical history of Cataract, COPD (chronic obstructive pulmonary disease) ([place]), Depression, DVT (deep venous thrombosis) ([place]), Gastric erosion, GERD (gastroesophageal reflux disease), Hypertension, Insomnia, Ischemic colitis ([place]), Osteoarthritis, Parotid mass (2016), and 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   She has a past surgical history that includes Total hip arthroplasty; Knee Arthroplasty;  Esophagogastroduodenoscopy (10/17/2007); Colonoscopy; Back surgery; Abdominal hysterectomy; Cholecystectomy; Cataracts; and Parotidectomy (Left, 05/17/2015).   Her family history includes Cancer in her sister; Rheum arthritis in her mother; Ulcers in her father and mother.She reports that she has never smoked. She has never used smokeless tobacco. She reports that she does not drink alcohol or use drugs.    ROS Review of Systems  Unable to perform ROS: Other    Objective:  BP (!) 136/59   Pulse 68   Temp 97.7 F (36.5 C) (Oral)   Ht 4\' 9"  (1.448 m)   Wt 175 lb 8 oz (79.6 kg)   BMI 37.98 kg/m   BP Readings from Last 3 Encounters:  06/04/18 (!) 136/59  05/29/18 (!) 150/74  05/20/18 114/60    Wt Readings from Last 3 Encounters:  06/04/18 175 lb 8 oz (79.6 kg)  05/29/18 180 lb (81.6 kg)  05/20/18 177 lb (80.3 kg)     Physical Exam  Constitutional: She is oriented to person, place, and time. She appears well-developed and well-nourished. No distress.  HENT:  Head: Normocephalic and atraumatic.  Right Ear: External ear normal.  Left Ear: External ear normal.  Nose: Nose normal.  Mouth/Throat: Oropharynx is clear and moist.  Eyes: Pupils are equal, round, and reactive to light. Conjunctivae and EOM are normal.  Neck: Normal range of motion. Neck supple. No thyromegaly present.  Cardiovascular: Normal rate, regular rhythm and normal  heart sounds.  No murmur heard. Pulmonary/Chest: Effort normal and breath sounds normal. No respiratory distress. She has no wheezes. She has no rales.  Abdominal: Soft. Bowel sounds are normal. She exhibits no distension. There is no tenderness.  Lymphadenopathy:    She has no cervical adenopathy.  Neurological: She is alert and oriented to person, place, and time. She has normal reflexes. No sensory deficit.  There is a speech apraxia for expression.  She does not have aphasia  Skin: Skin is warm and dry.  Psychiatric: She has a normal mood  and affect. Her behavior is normal. Judgment and thought content normal.      Assessment & Plan:   Nalina was seen today for medical management of chronic issues.  Diagnoses and all orders for this visit:  Atrial fibrillation, chronic (HCC)  Primary osteoarthritis involving multiple joints  Chronic obstructive pulmonary disease, unspecified COPD type ([place])  Right shoulder pain, unspecified chronicity -     Ambulatory referral to Physical Therapy  Pain of foot, unspecified laterality -     Ambulatory referral to Podiatry  Depression, recurrent ([place])  Essential hypertension  Anemia due to other cause, not classified  B12 deficiency  Insomnia, unspecified type  Other orders -     amitriptyline (ELAVIL) 75 MG tablet; Take 1 tablet (75 mg total) by mouth at bedtime. -     citalopram (CELEXA) 20 MG tablet; Take 1 tablet (20 mg total) by mouth daily. -     donepezil (ARICEPT) 10 MG tablet; Take 1 tablet (10 mg total) by mouth at bedtime. -     furosemide (LASIX) 40 MG tablet; TAKE (2) TABLETS DAILY -     gabapentin (NEURONTIN) 300 MG capsule; TAKE (1) CAPSULE TWICE DAILY. -     hydrOXYzine (ATARAX/VISTARIL) 10 MG tablet; TAKE (1) TABLET THREE TIMES DAILY. -     levocetirizine (XYZAL) 5 MG tablet; Take 1 tablet (5 mg total) by mouth at bedtime. -     omeprazole (PRILOSEC) 20 MG capsule; TAKE 2 CAPSULES DAILY AS NEEDED FOR REFLUX -     rOPINIRole (REQUIP) 1 MG tablet; TAKE 1 OR 2 TABLETS AT BEDTIME -     verapamil (CALAN-SR) 180 MG CR tablet; Take 1 tablet (180 mg total) by mouth at bedtime.       I have discontinued Rasheedah Reis. Hecker's predniSONE. I have also changed her amitriptyline, citalopram, donepezil, and verapamil. Additionally, I am having her maintain her albuterol, calcium-vitamin D, Vitamin D-Vitamin K (VITAMIN K2-VITAMIN D3 PO), OXYGEN, triamcinolone cream, ketoconazole, acetaminophen, ELIQUIS, potassium chloride, furosemide, gabapentin, hydrOXYzine,  levocetirizine, omeprazole, and rOPINIRole. We will continue to administer cyanocobalamin.  Allergies as of 06/04/2018      Reactions   Diltiazem Anxiety, Other (See Comments)   Sleep disturbance   Iohexol     Desc: THROAT SWELLING-NOTED IN CHART AFTER IVC PLACEMENT-ARS 12-02-05   Latex Other (See Comments)   unknown   Penicillins Swelling   Patient was given zosyn this admission and tolerated. After d/w MD and pt, she had swelling in arm only, so possible just infiltration and not allergic. ABx changed to Cefazolin and is tolerating   Shellfish Allergy Other (See Comments)   Reaction unknown      Medication List        Accurate as of 06/04/18  6:04 PM. Always use your most recent med list.          acetaminophen 500 MG tablet Commonly known as:  TYLENOL Take 1,000 mg  by mouth every 6 (six) hours as needed.   albuterol (2.5 MG/3ML) 0.083% nebulizer solution Commonly known as:  PROVENTIL Take 3 mLs (2.5 mg total) by nebulization every 2 (two) hours as needed for wheezing or shortness of breath.   amitriptyline 75 MG tablet Commonly known as:  ELAVIL Take 1 tablet (75 mg total) by mouth at bedtime.   calcium-vitamin D 500-200 MG-UNIT tablet Take 1 tablet by mouth every morning.   citalopram 20 MG tablet Commonly known as:  CELEXA Take 1 tablet (20 mg total) by mouth daily.   donepezil 10 MG tablet Commonly known as:  ARICEPT Take 1 tablet (10 mg total) by mouth at bedtime.   ELIQUIS 5 MG Tabs tablet Generic drug:  apixaban TAKE (1) TABLET TWICE A DAY.   furosemide 40 MG tablet Commonly known as:  LASIX TAKE (2) TABLETS DAILY   gabapentin 300 MG capsule Commonly known as:  NEURONTIN TAKE (1) CAPSULE TWICE DAILY.   hydrOXYzine 10 MG tablet Commonly known as:  ATARAX/VISTARIL TAKE (1) TABLET THREE TIMES DAILY.   ketoconazole 2 % cream Commonly known as:  NIZORAL Apply 1 application topically daily as needed.   levocetirizine 5 MG tablet Commonly known as:   XYZAL Take 1 tablet (5 mg total) by mouth at bedtime.   omeprazole 20 MG capsule Commonly known as:  PRILOSEC TAKE 2 CAPSULES DAILY AS NEEDED FOR REFLUX   OXYGEN Inhale into the lungs at bedtime.   potassium chloride 10 MEQ tablet Commonly known as:  K-DUR TAKE 4 TABLETS ONCE DAILY   rOPINIRole 1 MG tablet Commonly known as:  REQUIP TAKE 1 OR 2 TABLETS AT BEDTIME   triamcinolone cream 0.1 % Commonly known as:  KENALOG Apply 1 application topically 2 (two) times daily.   verapamil 180 MG CR tablet Commonly known as:  CALAN-SR Take 1 tablet (180 mg total) by mouth at bedtime.   VITAMIN K2-VITAMIN D3 PO Take 2 capsules by mouth every morning.        Follow-up: Return in about 3 months (around 09/03/2018).  Claretta Fraise, M.D.

## 2018-06-05 ENCOUNTER — Telehealth: Payer: Self-pay | Admitting: Family Medicine

## 2018-06-05 NOTE — Telephone Encounter (Signed)
Pt wanted refill on her Tramadol yesterday but it wasn't sent over.

## 2018-06-06 ENCOUNTER — Other Ambulatory Visit: Payer: Self-pay | Admitting: Family Medicine

## 2018-06-06 DIAGNOSIS — M150 Primary generalized (osteo)arthritis: Secondary | ICD-10-CM

## 2018-06-06 DIAGNOSIS — R2689 Other abnormalities of gait and mobility: Secondary | ICD-10-CM

## 2018-06-06 DIAGNOSIS — M159 Polyosteoarthritis, unspecified: Secondary | ICD-10-CM

## 2018-06-06 DIAGNOSIS — M79601 Pain in right arm: Secondary | ICD-10-CM

## 2018-06-06 MED ORDER — TRAMADOL HCL 50 MG PO TABS: 50.0000 mg | ORAL_TABLET | Freq: Four times a day (QID) | ORAL | 2 refills | Status: AC | PRN

## 2018-06-06 NOTE — Telephone Encounter (Signed)
I sent in the requested prescription 

## 2018-06-06 NOTE — Telephone Encounter (Signed)
Pt aware.

## 2018-06-08 DIAGNOSIS — R531 Weakness: Secondary | ICD-10-CM | POA: Diagnosis not present

## 2018-06-08 DIAGNOSIS — Z9181 History of falling: Secondary | ICD-10-CM | POA: Diagnosis not present

## 2018-06-08 DIAGNOSIS — R600 Localized edema: Secondary | ICD-10-CM | POA: Diagnosis not present

## 2018-06-08 DIAGNOSIS — M150 Primary generalized (osteo)arthritis: Secondary | ICD-10-CM | POA: Diagnosis not present

## 2018-06-08 DIAGNOSIS — I482 Chronic atrial fibrillation: Secondary | ICD-10-CM | POA: Diagnosis not present

## 2018-06-08 DIAGNOSIS — J449 Chronic obstructive pulmonary disease, unspecified: Secondary | ICD-10-CM | POA: Diagnosis not present

## 2018-06-11 ENCOUNTER — Other Ambulatory Visit: Payer: Self-pay

## 2018-06-11 NOTE — Patient Outreach (Signed)
Telephone follow up:  Placed call to patient who answered and reports that she is doing well. Patient reports she is doing well. Reports no new falls. Reports she is using her walker.  Reports she has seen primary MD recently.   PLAN: Will call patient back in 1 month. Encouraged patient to call sooner if needed.  Tomasa Rand, RN, BSN, CEN [facility] [employer] [phone]

## 2018-06-16 ENCOUNTER — Ambulatory Visit: Payer: Self-pay | Admitting: *Deleted

## 2018-06-24 DIAGNOSIS — J449 Chronic obstructive pulmonary disease, unspecified: Secondary | ICD-10-CM | POA: Diagnosis not present

## 2018-07-03 ENCOUNTER — Ambulatory Visit (INDEPENDENT_AMBULATORY_CARE_PROVIDER_SITE_OTHER): Payer: Medicare Other | Admitting: Orthopaedic Surgery

## 2018-07-03 ENCOUNTER — Other Ambulatory Visit: Payer: Self-pay | Admitting: *Deleted

## 2018-07-03 MED ORDER — TRIAMCINOLONE ACETONIDE 0.1 % EX CREA: 1.0000 "application " | TOPICAL_CREAM | Freq: Two times a day (BID) | CUTANEOUS | 0 refills | Status: DC

## 2018-07-08 DIAGNOSIS — J449 Chronic obstructive pulmonary disease, unspecified: Secondary | ICD-10-CM | POA: Diagnosis not present

## 2018-07-08 DIAGNOSIS — Z9181 History of falling: Secondary | ICD-10-CM | POA: Diagnosis not present

## 2018-07-08 DIAGNOSIS — M150 Primary generalized (osteo)arthritis: Secondary | ICD-10-CM | POA: Diagnosis not present

## 2018-07-08 DIAGNOSIS — R531 Weakness: Secondary | ICD-10-CM | POA: Diagnosis not present

## 2018-07-08 DIAGNOSIS — R600 Localized edema: Secondary | ICD-10-CM | POA: Diagnosis not present

## 2018-07-10 ENCOUNTER — Ambulatory Visit (INDEPENDENT_AMBULATORY_CARE_PROVIDER_SITE_OTHER): Payer: Medicare Other | Admitting: Orthopaedic Surgery

## 2018-07-10 ENCOUNTER — Encounter (INDEPENDENT_AMBULATORY_CARE_PROVIDER_SITE_OTHER): Payer: Self-pay | Admitting: Orthopaedic Surgery

## 2018-07-10 VITALS — BP 134/56 | HR 70 | Ht 59.0 in | Wt 173.0 lb

## 2018-07-10 DIAGNOSIS — M19011 Primary osteoarthritis, right shoulder: Secondary | ICD-10-CM

## 2018-07-10 MED ORDER — BUPIVACAINE HCL 0.25 % IJ SOLN
4.0000 mL | INTRAMUSCULAR | Status: AC | PRN
  Administered 2018-07-10: 4 mL via INTRA_ARTICULAR

## 2018-07-10 MED ORDER — LIDOCAINE HCL 1 % IJ SOLN
0.5000 mL | INTRAMUSCULAR | Status: AC | PRN
  Administered 2018-07-10: .5 mL

## 2018-07-10 MED ORDER — METHYLPREDNISOLONE ACETATE 40 MG/ML IJ SUSP
40.0000 mg | INTRAMUSCULAR | Status: AC | PRN
  Administered 2018-07-10: 40 mg via INTRA_ARTICULAR

## 2018-07-10 NOTE — Progress Notes (Signed)
Office Visit Note   Patient: [name] Walker           Date of Birth: [birth year]           MRN: [id number] Visit Date: 07/10/2018              Requested by: Claretta Fraise, MD [place], [place] [address] PCP: Claretta Fraise, MD   Assessment & Plan: Visit Diagnoses:  1. Primary osteoarthritis of right shoulder        Tatar cuff arthropathy with humeral head abutting the acromium and severe glenohumeral arthritis.  Plan: X-ray results were reviewed with the patient.  Subacromial injection performed with cortisone which she tolerated well.  Hopefully she will get some pain relief.  Follow-Up Instructions: No follow-ups on file.   Orders:  Orders Placed This Encounter  Procedures  . Large Joint Inj: R glenohumeral   No orders of the defined types were placed in this encounter.     Procedures: Large Joint Inj: R glenohumeral on 07/10/2018 2:43 PM Indications: pain Details: 22 G 1.5 in needle  Arthrogram: No  Medications: 4 mL bupivacaine 0.25 %; 40 mg methylPREDNISolone acetate 40 MG/ML; 0.5 mL lidocaine 1 % Outcome: tolerated well, no immediate complications Procedure, treatment alternatives, risks and benefits explained, specific risks discussed. Consent was given by the patient. Immediately prior to procedure a time out was called to verify the correct patient, procedure, equipment, support staff and site/side marked as required. Patient was prepped and draped in the usual sterile fashion.       Clinical Data: No additional findings.   Subjective: Chief Complaint  Patient presents with  . Right Arm - Pain    HPI 84 year old female who have not seen in many years is here for recurrent problems with her right shoulder.  She reminds me that many years ago I told her she had a torn rotator cuff complete with shoulder osteoarthritis.  She has had a history of a fracture at C5 she has some tingling in her fingers she has an Ace wrap wrapped around her neck  that she is been using since July after she fell and uses assistance the sling is uncomfortable for her.  She is used heat tramadol sling ice topical medication.  She has pain with outstretched reaching trying to reach overhead she can only abduct her arm about 40 degrees.  Patient is missing her teeth lost her dentures in the past states are too expensive and is somewhat difficult to understand.  Review of Systems Some positive COPD irritable bowel GERD, C5 vertebral fracture, rotator cuff arthropathy right shoulder atrial fib, depression atrial fib otherwise negative is a pertains HPI.  Objective: Vital Signs: BP (!) 134/56   Pulse 70   Ht 4\' 11"  (1.499 m)   Wt 173 lb (78.5 kg)   BMI 34.94 kg/m   Physical Exam  Constitutional: She is oriented to person, place, and time. She appears well-developed.  HENT:  Head: Normocephalic.  Right Ear: External ear normal.  Left Ear: External ear normal.  Eyes: Pupils are equal, round, and reactive to light.  Neck: No tracheal deviation present. No thyromegaly present.  Cardiovascular: Normal rate.  Pulmonary/Chest: Effort normal.  Abdominal: Soft.  Neurological: She is alert and oriented to person, place, and time.  Skin: Skin is warm and dry.  Psychiatric: She has a normal mood and affect. Her behavior is normal.    Ortho Exam good age of motion in both elbows.  She can only abduct or flex her right shoulder 30 degrees due to severe pain.  Deltoid atrophy sensation of the hand is intact.   Specialty Comments:  No specialty comments available.  Imaging: CLINICAL DATA:  Fall in July with continued shoulder pain  EXAM: RIGHT HUMERUS - 2+ VIEW  COMPARISON:  None.  FINDINGS: No displaced fracture or dislocation. Severe arthritis at the glenohumeral interval with joint space narrowing, sclerosis and osteophyte. Narrowed subacromial space consistent with rotator cuff disease. Prominent AC joint degenerative change with bulky  superior and inferior osteophytes.  IMPRESSION: 1. No acute osseous abnormality. 2. Advanced arthritis of the right shoulder with rotator cuff disease.   Electronically Signed   By: Donavan Foil M.D.   On: 05/29/2018 15:03    PMFS History: Patient Active Problem List   Diagnosis Date Noted  . Primary osteoarthritis involving multiple joints 03/03/2018  . Imbalance 03/03/2018  . Frequent falls 03/03/2018  . Supplemental oxygen dependent 07/14/2016  . C5 vertebral fracture ([place]) 05/25/2016  . Hypokalemia 05/23/2016  . B12 deficiency 08/24/2015  . Insomnia 08/15/2015  . 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 Atrial fibrillation, chronic 07/22/2015  . H/O parotidectomy 05/17/2015  . Osteopenia 05/13/2015  . Weakness 07/15/2013  . Anemia 01/06/2009  . Depression, recurrent ([place]) 01/06/2009  . Essential hypertension 01/06/2009  . COPD (chronic obstructive pulmonary disease) ([place]) 01/06/2009  . GERD 01/06/2009  . IRRITABLE BOWEL SYNDROME 01/06/2009   Past Medical History:  Diagnosis Date  . Cataract   . COPD (chronic obstructive pulmonary disease) ([place])   . Depression   . DVT (deep venous thrombosis) ([place])   . Gastric erosion   . GERD (gastroesophageal reflux disease)   . Hypertension   . Insomnia   . Ischemic colitis ([place])   . Osteoarthritis   . Parotid mass 2016  . 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 Rheum arthritis Mother   . Ulcers Mother        on foot  . Ulcers Father   . Cancer Sister        uterine    Past Surgical History:  Procedure Laterality Date  . ABDOMINAL HYSTERECTOMY    . BACK SURGERY    . Cataracts    . CHOLECYSTECTOMY    . COLONOSCOPY    . ESOPHAGOGASTRODUODENOSCOPY  10/17/2007   erosion   . KNEE ARTHROPLASTY     bilateral  . PAROTIDECTOMY Left 05/17/2015   Procedure: PAROTIDECTOMY- LEFT TOTAL;  Surgeon: Leta Baptist, MD;   Location: [facility];  Service: ENT;  Laterality: Left;  . TOTAL HIP ARTHROPLASTY     bilateral   Social History   Occupational History  . Not on file  Tobacco Use  . Smoking status: Never Smoker  . Smokeless tobacco: Never Used  Substance and Sexual Activity  . Alcohol use: No  . Drug use: No  . Sexual activity: Never    Birth control/protection: Surgical, Post-menopausal

## 2018-07-15 ENCOUNTER — Other Ambulatory Visit: Payer: Self-pay

## 2018-07-15 NOTE — Patient Outreach (Signed)
Telephone assessment:  Placed call to patient for monthly follow up. No answer. No machine.  PLAN: will attempt again in 24 hours. If no response will mail letter.  Tomasa Rand, RN, BSN, CEN [facility] [employer] [phone]

## 2018-07-16 ENCOUNTER — Other Ambulatory Visit: Payer: Self-pay

## 2018-07-16 NOTE — Patient Outreach (Signed)
Telephone assessment:  Placed call to patient who did not answer.  2nd attempt with no answer.  PLAN: will Corporate treasurer. Will call again in 3-4 days.  Tomasa Rand, RN, BSN, CEN [facility] [employer] [phone]

## 2018-07-21 ENCOUNTER — Other Ambulatory Visit: Payer: Self-pay

## 2018-07-21 ENCOUNTER — Ambulatory Visit: Payer: Self-pay

## 2018-07-22 ENCOUNTER — Telehealth: Payer: Self-pay | Admitting: Family Medicine

## 2018-07-22 ENCOUNTER — Telehealth (INDEPENDENT_AMBULATORY_CARE_PROVIDER_SITE_OTHER): Payer: Self-pay | Admitting: Orthopaedic Surgery

## 2018-07-22 NOTE — Telephone Encounter (Signed)
Have her see Dr. Marlou Sa . Has RC tear and shoulder OA. Option for surgery would be reverse shoulder.

## 2018-07-22 NOTE — Patient Outreach (Signed)
Telephone assessment:  Incoming call from patient who reports that she needs help in her home with bathing and housing keeping. Denies any new falls. Denies family support for assistance.  Very difficult to understand patient via phone due to missing teeth.  Reports no dentures.   PLAN: no nursing needs identified at this time.  Will close case to nursing at this time.  Will place order for social worker, which appears to be needs with transportation, housekeeping and bath aid. Patient inform to expect a call from [facility] social worker for assistance with her current needs.  Tomasa Rand, RN, BSN, CEN [facility] [employer] [phone]

## 2018-07-22 NOTE — Telephone Encounter (Signed)
Pt aware to contact Dr Lorin Mercy. Phone # for yates given to pt.

## 2018-07-22 NOTE — Telephone Encounter (Signed)
Patient was seen 9/5 for right shoulder pain by Dr. Livia Snellen. Was given Tramadol and states that she is out but it didn't help her pain any.  Patient was seen by Dr. Lorin Mercy 10/17 and was given joint injection- states it didn't help her pain any and would like something sent into pharmacy to help. Please advise

## 2018-07-22 NOTE — Telephone Encounter (Signed)
I am out of suggestions. Refer to orthopedics

## 2018-07-22 NOTE — Telephone Encounter (Signed)
Patient [name] Walker requesting a call from Dr Lorin Mercy or his nurse, stating she's still in severe pain and needs something for pain.

## 2018-07-22 NOTE — Telephone Encounter (Signed)
Please advise. I spoke with patient. She states that she cannot sleep, cannot reach out, and is in terrible pain. She needs something for pain. She is taking Tylenol with no relief.

## 2018-07-23 MED ORDER — TRAMADOL HCL 50 MG PO TABS: 50.0000 mg | ORAL_TABLET | Freq: Two times a day (BID) | ORAL | 0 refills | Status: DC | PRN

## 2018-07-23 NOTE — Telephone Encounter (Signed)
Ok thx.

## 2018-07-23 NOTE — Telephone Encounter (Signed)
Pt has appointment with Dr. Marlou Sa 11/6. She requests something for pain until then. Please advise.

## 2018-07-23 NOTE — Telephone Encounter (Signed)
Medication called to pharmacy. I tried to reach patient to advise, no answer, no way to leave message.

## 2018-07-23 NOTE — Telephone Encounter (Signed)
Ok ultram # 20 one po bid prn

## 2018-07-24 ENCOUNTER — Other Ambulatory Visit: Payer: Self-pay

## 2018-07-24 NOTE — Patient Outreach (Signed)
[facility] [facility]) Care Management  07/24/2018  [name] Walker 02/05/1925 [id number]   Initial outreach attempt to patient regarding social work referral for transportation and in-home aide assistance.  BSW called patient's number multiple times and received busy signal each time.  Will attempt to reach again within four business days.   Unsuccessful outreach letter mailed.   Ronn Melena, BSW Social Worker [phone]

## 2018-07-24 NOTE — Telephone Encounter (Signed)
Attempted to reach patient again. No answer and unable to leave message. Will wait for return call to advise. Medication has been called to pharmacy.

## 2018-07-25 DIAGNOSIS — J449 Chronic obstructive pulmonary disease, unspecified: Secondary | ICD-10-CM | POA: Diagnosis not present

## 2018-07-29 ENCOUNTER — Other Ambulatory Visit: Payer: Self-pay

## 2018-07-29 NOTE — Patient Outreach (Signed)
[facility]) Care Management  07/29/2018  [name] Walker [birth year] [id number]   Successful outreach to patient regarding social work referral for in-home support and transportation resources. BSW talked with [name] about in-home services that are and are not covered by Medicare.  BSW inquired about whether or not she has applied for Medicaid and she reported that she does not qualify.  BSW educated [name] about services  provided by [facility] and [employer].  [name] denied being able to afford private pay for in-home services and said that he granddaughter manages her finances.  BSW offered multiple times to contact her granddaughter to discuss options but [name] did not give consent for this.  BSW offered to submit referral to [facility] but [name] declined, however, she did agree to [employer]. BSW will follow up next week to ensure receipt.   Ronn Melena, BSW Social Worker [phone]

## 2018-07-30 ENCOUNTER — Ambulatory Visit (INDEPENDENT_AMBULATORY_CARE_PROVIDER_SITE_OTHER): Payer: Medicare Other | Admitting: Orthopedic Surgery

## 2018-07-31 ENCOUNTER — Telehealth: Payer: Self-pay

## 2018-07-31 DIAGNOSIS — M25511 Pain in right shoulder: Secondary | ICD-10-CM

## 2018-07-31 NOTE — Telephone Encounter (Signed)
Wants a referral for Dr Veverly Fells  Orthopedic

## 2018-08-01 ENCOUNTER — Telehealth: Payer: Self-pay | Admitting: Family Medicine

## 2018-08-01 ENCOUNTER — Other Ambulatory Visit: Payer: Self-pay | Admitting: Family Medicine

## 2018-08-01 MED ORDER — TRAMADOL HCL 50 MG PO TABS: 50.0000 mg | ORAL_TABLET | Freq: Two times a day (BID) | ORAL | 0 refills | Status: DC | PRN

## 2018-08-01 NOTE — Telephone Encounter (Addendum)
Forwarded to Dr. Livia Snellen regarding pain medication request.  Referral entered per Dr. Livia Snellen response in phone note yesterday.

## 2018-08-01 NOTE — Addendum Note (Signed)
Addended by: Denyce Robert on: 08/01/2018 02:11 PM   Modules accepted: Orders

## 2018-08-01 NOTE — Telephone Encounter (Signed)
Pt states that she is in a lot of pain, she wants to know if we can send in pain  Medicine to [name] Walker  She also wants to know if we can send referral to dr Remo Lipps norris about her shoulder

## 2018-08-01 NOTE — Telephone Encounter (Signed)
Patient notified referral entered.

## 2018-08-01 NOTE — Telephone Encounter (Signed)
Please refer as pt requests 

## 2018-08-05 ENCOUNTER — Other Ambulatory Visit: Payer: Self-pay | Admitting: *Deleted

## 2018-08-05 ENCOUNTER — Ambulatory Visit: Payer: Self-pay

## 2018-08-05 MED ORDER — TRIAMCINOLONE ACETONIDE 0.1 % EX CREA: 1.0000 "application " | TOPICAL_CREAM | Freq: Two times a day (BID) | CUTANEOUS | 0 refills | Status: DC

## 2018-08-06 ENCOUNTER — Other Ambulatory Visit (INDEPENDENT_AMBULATORY_CARE_PROVIDER_SITE_OTHER): Payer: Self-pay | Admitting: Orthopaedic Surgery

## 2018-08-07 ENCOUNTER — Other Ambulatory Visit: Payer: Self-pay | Admitting: Family Medicine

## 2018-08-07 NOTE — Telephone Encounter (Signed)
Denied. Looks like PCP has been giving to her.He did last Rx. Not actively following her at this pt.

## 2018-08-07 NOTE — Telephone Encounter (Signed)
Ok for refill? 

## 2018-08-08 ENCOUNTER — Other Ambulatory Visit: Payer: Self-pay

## 2018-08-08 DIAGNOSIS — R600 Localized edema: Secondary | ICD-10-CM | POA: Diagnosis not present

## 2018-08-08 DIAGNOSIS — J449 Chronic obstructive pulmonary disease, unspecified: Secondary | ICD-10-CM | POA: Diagnosis not present

## 2018-08-08 DIAGNOSIS — Z9181 History of falling: Secondary | ICD-10-CM | POA: Diagnosis not present

## 2018-08-08 DIAGNOSIS — M150 Primary generalized (osteo)arthritis: Secondary | ICD-10-CM | POA: Diagnosis not present

## 2018-08-08 DIAGNOSIS — R531 Weakness: Secondary | ICD-10-CM | POA: Diagnosis not present

## 2018-08-08 NOTE — Patient Outreach (Signed)
[facility]) Care Management  08/08/2018  [name] Walker Jan 03, 1925 [id number]   Patient confirmed receipt of resources mailed on 07/29/18.  BSW closing case at this time.   Ronn Melena, BSW Social Worker [phone]

## 2018-08-19 DIAGNOSIS — H353123 Nonexudative age-related macular degeneration, left eye, advanced atrophic without subfoveal involvement: Secondary | ICD-10-CM | POA: Diagnosis not present

## 2018-08-19 DIAGNOSIS — H353212 Exudative age-related macular degeneration, right eye, with inactive choroidal neovascularization: Secondary | ICD-10-CM | POA: Diagnosis not present

## 2018-08-19 DIAGNOSIS — H43811 Vitreous degeneration, right eye: Secondary | ICD-10-CM | POA: Diagnosis not present

## 2018-08-19 DIAGNOSIS — H353113 Nonexudative age-related macular degeneration, right eye, advanced atrophic without subfoveal involvement: Secondary | ICD-10-CM | POA: Diagnosis not present

## 2018-08-24 DIAGNOSIS — J449 Chronic obstructive pulmonary disease, unspecified: Secondary | ICD-10-CM | POA: Diagnosis not present

## 2018-08-26 DIAGNOSIS — M25511 Pain in right shoulder: Secondary | ICD-10-CM | POA: Diagnosis not present

## 2018-08-27 ENCOUNTER — Other Ambulatory Visit: Payer: Self-pay | Admitting: Orthopedic Surgery

## 2018-08-27 DIAGNOSIS — M25511 Pain in right shoulder: Secondary | ICD-10-CM

## 2018-09-01 ENCOUNTER — Ambulatory Visit
Admission: RE | Admit: 2018-09-01 | Discharge: 2018-09-01 | Disposition: A | Discharge status: Home / Self Care | Payer: Medicare Other | Source: Ambulatory Visit | Attending: Orthopedic Surgery | Admitting: Orthopedic Surgery

## 2018-09-01 DIAGNOSIS — M19011 Primary osteoarthritis, right shoulder: Secondary | ICD-10-CM | POA: Diagnosis not present

## 2018-09-01 DIAGNOSIS — M25511 Pain in right shoulder: Secondary | ICD-10-CM

## 2018-09-01 DIAGNOSIS — R937 Abnormal findings on diagnostic imaging of other parts of musculoskeletal system: Secondary | ICD-10-CM | POA: Diagnosis not present

## 2018-09-03 ENCOUNTER — Ambulatory Visit: Payer: Medicare Other | Admitting: Family Medicine

## 2018-09-03 ENCOUNTER — Ambulatory Visit: Payer: Self-pay | Admitting: Family Medicine

## 2018-09-07 DIAGNOSIS — M150 Primary generalized (osteo)arthritis: Secondary | ICD-10-CM | POA: Diagnosis not present

## 2018-09-07 DIAGNOSIS — R600 Localized edema: Secondary | ICD-10-CM | POA: Diagnosis not present

## 2018-09-07 DIAGNOSIS — Z9181 History of falling: Secondary | ICD-10-CM | POA: Diagnosis not present

## 2018-09-07 DIAGNOSIS — J449 Chronic obstructive pulmonary disease, unspecified: Secondary | ICD-10-CM | POA: Diagnosis not present

## 2018-09-07 DIAGNOSIS — R531 Weakness: Secondary | ICD-10-CM | POA: Diagnosis not present

## 2018-09-10 DIAGNOSIS — R600 Localized edema: Secondary | ICD-10-CM | POA: Diagnosis not present

## 2018-09-10 DIAGNOSIS — M25811 Other specified joint disorders, right shoulder: Secondary | ICD-10-CM | POA: Diagnosis not present

## 2018-09-10 DIAGNOSIS — M19011 Primary osteoarthritis, right shoulder: Secondary | ICD-10-CM | POA: Diagnosis not present

## 2018-09-22 ENCOUNTER — Other Ambulatory Visit: Payer: Self-pay | Admitting: Family Medicine

## 2018-09-24 DIAGNOSIS — J449 Chronic obstructive pulmonary disease, unspecified: Secondary | ICD-10-CM | POA: Diagnosis not present

## 2018-09-26 DIAGNOSIS — M25811 Other specified joint disorders, right shoulder: Secondary | ICD-10-CM | POA: Diagnosis not present

## 2018-09-26 DIAGNOSIS — M19011 Primary osteoarthritis, right shoulder: Secondary | ICD-10-CM | POA: Diagnosis not present

## 2018-09-30 ENCOUNTER — Other Ambulatory Visit: Payer: Self-pay | Admitting: Family Medicine

## 2018-10-08 DIAGNOSIS — Z9181 History of falling: Secondary | ICD-10-CM | POA: Diagnosis not present

## 2018-10-08 DIAGNOSIS — M150 Primary generalized (osteo)arthritis: Secondary | ICD-10-CM | POA: Diagnosis not present

## 2018-10-08 DIAGNOSIS — J449 Chronic obstructive pulmonary disease, unspecified: Secondary | ICD-10-CM | POA: Diagnosis not present

## 2018-10-08 DIAGNOSIS — R531 Weakness: Secondary | ICD-10-CM | POA: Diagnosis not present

## 2018-10-08 DIAGNOSIS — R600 Localized edema: Secondary | ICD-10-CM | POA: Diagnosis not present

## 2018-10-10 ENCOUNTER — Other Ambulatory Visit: Payer: Self-pay | Admitting: Family Medicine

## 2018-10-15 ENCOUNTER — Encounter (HOSPITAL_BASED_OUTPATIENT_CLINIC_OR_DEPARTMENT_OTHER): Payer: Medicare Other | Attending: Internal Medicine

## 2018-10-25 DIAGNOSIS — J449 Chronic obstructive pulmonary disease, unspecified: Secondary | ICD-10-CM | POA: Diagnosis not present

## 2018-10-31 ENCOUNTER — Other Ambulatory Visit: Payer: Self-pay | Admitting: Family Medicine

## 2018-11-04 DIAGNOSIS — M19011 Primary osteoarthritis, right shoulder: Secondary | ICD-10-CM | POA: Diagnosis not present

## 2018-11-04 DIAGNOSIS — Z79891 Long term (current) use of opiate analgesic: Secondary | ICD-10-CM | POA: Diagnosis not present

## 2018-11-19 ENCOUNTER — Other Ambulatory Visit: Payer: Self-pay

## 2018-11-19 ENCOUNTER — Emergency Department (HOSPITAL_COMMUNITY): Payer: Medicare Other

## 2018-11-19 ENCOUNTER — Inpatient Hospital Stay (HOSPITAL_COMMUNITY)
Admission: EM | Admit: 2018-11-19 | Discharge: 2018-11-24 | DRG: 872 | Disposition: A | Discharge status: Home Health | Payer: Medicare Other | Attending: Family Medicine | Admitting: Family Medicine

## 2018-11-19 ENCOUNTER — Encounter (HOSPITAL_COMMUNITY): Payer: Self-pay

## 2018-11-19 DIAGNOSIS — Z880 Allergy status to penicillin: Secondary | ICD-10-CM

## 2018-11-19 DIAGNOSIS — I110 Hypertensive heart disease with heart failure: Secondary | ICD-10-CM | POA: Diagnosis not present

## 2018-11-19 DIAGNOSIS — R509 Fever, unspecified: Secondary | ICD-10-CM | POA: Diagnosis not present

## 2018-11-19 DIAGNOSIS — R059 Cough, unspecified: Secondary | ICD-10-CM

## 2018-11-19 DIAGNOSIS — Z7901 Long term (current) use of anticoagulants: Secondary | ICD-10-CM

## 2018-11-19 DIAGNOSIS — Z888 Allergy status to other drugs, medicaments and biological substances status: Secondary | ICD-10-CM | POA: Diagnosis not present

## 2018-11-19 DIAGNOSIS — I482 Chronic atrial fibrillation, unspecified: Secondary | ICD-10-CM | POA: Diagnosis present

## 2018-11-19 DIAGNOSIS — R05 Cough: Secondary | ICD-10-CM | POA: Diagnosis not present

## 2018-11-19 DIAGNOSIS — F339 Major depressive disorder, recurrent, unspecified: Secondary | ICD-10-CM | POA: Diagnosis present

## 2018-11-19 DIAGNOSIS — Z86718 Personal history of other venous thrombosis and embolism: Secondary | ICD-10-CM

## 2018-11-19 DIAGNOSIS — L89312 Pressure ulcer of right buttock, stage 2: Secondary | ICD-10-CM | POA: Diagnosis present

## 2018-11-19 DIAGNOSIS — M1990 Unspecified osteoarthritis, unspecified site: Secondary | ICD-10-CM | POA: Diagnosis present

## 2018-11-19 DIAGNOSIS — Z9981 Dependence on supplemental oxygen: Secondary | ICD-10-CM

## 2018-11-19 DIAGNOSIS — N3000 Acute cystitis without hematuria: Secondary | ICD-10-CM | POA: Diagnosis present

## 2018-11-19 DIAGNOSIS — N390 Urinary tract infection, site not specified: Secondary | ICD-10-CM | POA: Diagnosis not present

## 2018-11-19 DIAGNOSIS — M545 Low back pain: Secondary | ICD-10-CM | POA: Diagnosis not present

## 2018-11-19 DIAGNOSIS — L89322 Pressure ulcer of left buttock, stage 2: Secondary | ICD-10-CM | POA: Diagnosis not present

## 2018-11-19 DIAGNOSIS — K219 Gastro-esophageal reflux disease without esophagitis: Secondary | ICD-10-CM | POA: Diagnosis present

## 2018-11-19 DIAGNOSIS — Z90710 Acquired absence of both cervix and uterus: Secondary | ICD-10-CM

## 2018-11-19 DIAGNOSIS — A419 Sepsis, unspecified organism: Secondary | ICD-10-CM | POA: Diagnosis not present

## 2018-11-19 DIAGNOSIS — I4891 Unspecified atrial fibrillation: Secondary | ICD-10-CM | POA: Diagnosis present

## 2018-11-19 DIAGNOSIS — R52 Pain, unspecified: Secondary | ICD-10-CM | POA: Diagnosis not present

## 2018-11-19 DIAGNOSIS — I10 Essential (primary) hypertension: Secondary | ICD-10-CM | POA: Diagnosis not present

## 2018-11-19 DIAGNOSIS — I340 Nonrheumatic mitral (valve) insufficiency: Secondary | ICD-10-CM | POA: Diagnosis not present

## 2018-11-19 DIAGNOSIS — I959 Hypotension, unspecified: Secondary | ICD-10-CM | POA: Diagnosis not present

## 2018-11-19 DIAGNOSIS — L03119 Cellulitis of unspecified part of limb: Secondary | ICD-10-CM | POA: Diagnosis not present

## 2018-11-19 DIAGNOSIS — L8990 Pressure ulcer of unspecified site, unspecified stage: Secondary | ICD-10-CM

## 2018-11-19 DIAGNOSIS — Z8261 Family history of arthritis: Secondary | ICD-10-CM | POA: Diagnosis not present

## 2018-11-19 DIAGNOSIS — R7881 Bacteremia: Secondary | ICD-10-CM | POA: Diagnosis not present

## 2018-11-19 DIAGNOSIS — N179 Acute kidney failure, unspecified: Secondary | ICD-10-CM | POA: Diagnosis present

## 2018-11-19 DIAGNOSIS — B9620 Unspecified Escherichia coli [E. coli] as the cause of diseases classified elsewhere: Secondary | ICD-10-CM | POA: Diagnosis not present

## 2018-11-19 DIAGNOSIS — R0902 Hypoxemia: Secondary | ICD-10-CM | POA: Diagnosis not present

## 2018-11-19 DIAGNOSIS — Z91041 Radiographic dye allergy status: Secondary | ICD-10-CM

## 2018-11-19 DIAGNOSIS — Z91040 Latex allergy status: Secondary | ICD-10-CM

## 2018-11-19 DIAGNOSIS — I361 Nonrheumatic tricuspid (valve) insufficiency: Secondary | ICD-10-CM | POA: Diagnosis not present

## 2018-11-19 DIAGNOSIS — F329 Major depressive disorder, single episode, unspecified: Secondary | ICD-10-CM | POA: Diagnosis present

## 2018-11-19 DIAGNOSIS — Z8711 Personal history of peptic ulcer disease: Secondary | ICD-10-CM | POA: Diagnosis not present

## 2018-11-19 DIAGNOSIS — Z96653 Presence of artificial knee joint, bilateral: Secondary | ICD-10-CM | POA: Diagnosis not present

## 2018-11-19 DIAGNOSIS — Z85818 Personal history of malignant neoplasm of other sites of lip, oral cavity, and pharynx: Secondary | ICD-10-CM | POA: Diagnosis not present

## 2018-11-19 DIAGNOSIS — A4151 Sepsis due to Escherichia coli [E. coli]: Secondary | ICD-10-CM | POA: Diagnosis not present

## 2018-11-19 DIAGNOSIS — Z66 Do not resuscitate: Secondary | ICD-10-CM | POA: Diagnosis not present

## 2018-11-19 DIAGNOSIS — I5022 Chronic systolic (congestive) heart failure: Secondary | ICD-10-CM | POA: Diagnosis present

## 2018-11-19 DIAGNOSIS — Z91013 Allergy to seafood: Secondary | ICD-10-CM

## 2018-11-19 DIAGNOSIS — J449 Chronic obstructive pulmonary disease, unspecified: Secondary | ICD-10-CM | POA: Diagnosis present

## 2018-11-19 DIAGNOSIS — R4020 Unspecified coma: Secondary | ICD-10-CM | POA: Diagnosis not present

## 2018-11-19 DIAGNOSIS — Z79899 Other long term (current) drug therapy: Secondary | ICD-10-CM

## 2018-11-19 DIAGNOSIS — J9601 Acute respiratory failure with hypoxia: Secondary | ICD-10-CM | POA: Diagnosis not present

## 2018-11-19 DIAGNOSIS — J439 Emphysema, unspecified: Secondary | ICD-10-CM | POA: Diagnosis not present

## 2018-11-19 LAB — URINALYSIS, ROUTINE W REFLEX MICROSCOPIC
BILIRUBIN URINE: NEGATIVE
Glucose, UA: NEGATIVE mg/dL
Ketones, ur: NEGATIVE mg/dL
Nitrite: NEGATIVE
Protein, ur: 100 mg/dL — AB
RBC / HPF: >50 RBC/hpf — ABNORMAL HIGH (ref 0–5)
Specific Gravity, Urine: 1.019 (ref 1.005–1.030)
WBC, UA: >50 WBC/hpf — ABNORMAL HIGH (ref 0–5)
pH: 5 (ref 5.0–8.0)

## 2018-11-19 LAB — COMPREHENSIVE METABOLIC PANEL
ALT: 11 U/L (ref 0–44)
AST: 21 U/L (ref 15–41)
Albumin: 3.1 g/dL — ABNORMAL LOW (ref 3.5–5.0)
Alkaline Phosphatase: 123 U/L (ref 38–126)
Anion gap: 10 (ref 5–15)
BUN: 15 mg/dL (ref 8–23)
CO2: 24 mmol/L (ref 22–32)
Calcium: 8.5 mg/dL — ABNORMAL LOW (ref 8.9–10.3)
Chloride: 103 mmol/L (ref 98–111)
Creatinine, Ser: 1.14 mg/dL — ABNORMAL HIGH (ref 0.44–1.00)
GFR calc Af Amer: 49 mL/min — ABNORMAL LOW (ref >60)
GFR calc non Af Amer: 42 mL/min — ABNORMAL LOW (ref >60)
Glucose, Bld: 101 mg/dL — ABNORMAL HIGH (ref 70–99)
POTASSIUM: 3.8 mmol/L (ref 3.5–5.1)
Sodium: 137 mmol/L (ref 135–145)
Total Bilirubin: 0.8 mg/dL (ref 0.3–1.2)
Total Protein: 6.2 g/dL — ABNORMAL LOW (ref 6.5–8.1)

## 2018-11-19 LAB — CBC WITH DIFFERENTIAL/PLATELET
Abs Immature Granulocytes: 0.12 10*3/uL — ABNORMAL HIGH (ref 0.00–0.07)
Basophils Absolute: 0 10*3/uL (ref 0.0–0.1)
Basophils Relative: 0 %
Eosinophils Absolute: 0 10*3/uL (ref 0.0–0.5)
Eosinophils Relative: 0 %
HCT: 34.8 % — ABNORMAL LOW (ref 36.0–46.0)
Hemoglobin: 10.7 g/dL — ABNORMAL LOW (ref 12.0–15.0)
Immature Granulocytes: 1 %
Lymphocytes Relative: 3 %
Lymphs Abs: 0.4 10*3/uL — ABNORMAL LOW (ref 0.7–4.0)
MCH: 30.7 pg (ref 26.0–34.0)
MCHC: 30.7 g/dL (ref 30.0–36.0)
MCV: 99.7 fL (ref 80.0–100.0)
Monocytes Absolute: 1.5 10*3/uL — ABNORMAL HIGH (ref 0.1–1.0)
Monocytes Relative: 10 %
Neutro Abs: 12.8 10*3/uL — ABNORMAL HIGH (ref 1.7–7.7)
Neutrophils Relative %: 86 %
Platelets: 223 10*3/uL (ref 150–400)
RBC: 3.49 MIL/uL — ABNORMAL LOW (ref 3.87–5.11)
RDW: 13.5 % (ref 11.5–15.5)
WBC: 14.9 10*3/uL — ABNORMAL HIGH (ref 4.0–10.5)
nRBC: 0 % (ref 0.0–0.2)

## 2018-11-19 LAB — TSH: TSH: 1.196 u[IU]/mL (ref 0.350–4.500)

## 2018-11-19 LAB — LACTIC ACID, PLASMA
Lactic Acid, Venous: 1.6 mmol/L (ref 0.5–1.9)
Lactic Acid, Venous: 1.3 mmol/L (ref 0.5–1.9)

## 2018-11-19 LAB — INFLUENZA PANEL BY PCR (TYPE A & B)
Influenza A By PCR: NEGATIVE
Influenza B By PCR: NEGATIVE

## 2018-11-19 LAB — MAGNESIUM: Magnesium: 1.8 mg/dL (ref 1.7–2.4)

## 2018-11-19 MED ORDER — POTASSIUM CHLORIDE IN NACL 20-0.9 MEQ/L-% IV SOLN: INTRAVENOUS | Status: DC

## 2018-11-19 MED ORDER — MIDODRINE HCL 5 MG PO TABS
10.0000 mg | ORAL_TABLET | Freq: Once | ORAL | Status: AC
  Administered 2018-11-20: 10 mg via ORAL
  Filled 2018-11-19 (×2): qty 2

## 2018-11-19 MED ORDER — POTASSIUM CHLORIDE IN NACL 20-0.9 MEQ/L-% IV SOLN
INTRAVENOUS | Status: AC
  Administered 2018-11-19: 22:00:00 via INTRAVENOUS

## 2018-11-19 MED ORDER — SODIUM CHLORIDE 0.9 % IV SOLN: 1.0000 g | INTRAVENOUS | Status: DC

## 2018-11-19 MED ORDER — ACETAMINOPHEN 650 MG RE SUPP: 650.0000 mg | Freq: Four times a day (QID) | RECTAL | Status: DC | PRN

## 2018-11-19 MED ORDER — SODIUM CHLORIDE 0.9 % IV SOLN
1.0000 g | Freq: Once | INTRAVENOUS | Status: AC
  Administered 2018-11-19: 1 g via INTRAVENOUS
  Filled 2018-11-19: qty 10

## 2018-11-19 MED ORDER — POTASSIUM CHLORIDE IN NACL 20-0.9 MEQ/L-% IV SOLN
INTRAVENOUS | Status: DC
  Filled 2018-11-19: qty 1000

## 2018-11-19 MED ORDER — ENOXAPARIN SODIUM 40 MG/0.4ML ~~LOC~~ SOLN
40.0000 mg | SUBCUTANEOUS | Status: DC
  Administered 2018-11-19: 40 mg via SUBCUTANEOUS
  Filled 2018-11-19: qty 0.4

## 2018-11-19 MED ORDER — LACTATED RINGERS IV BOLUS
1000.0000 mL | Freq: Once | INTRAVENOUS | Status: AC
  Administered 2018-11-19: 1000 mL via INTRAVENOUS

## 2018-11-19 MED ORDER — ACETAMINOPHEN 325 MG PO TABS
650.0000 mg | ORAL_TABLET | Freq: Four times a day (QID) | ORAL | Status: DC | PRN
  Administered 2018-11-20: 650 mg via ORAL
  Filled 2018-11-19: qty 2

## 2018-11-19 MED ORDER — VERAPAMIL HCL 80 MG PO TABS: 180.0000 mg | ORAL_TABLET | Freq: Once | ORAL | Status: DC

## 2018-11-19 MED ORDER — METOPROLOL TARTRATE 5 MG/5ML IV SOLN
INTRAVENOUS | Status: AC
  Filled 2018-11-19: qty 5

## 2018-11-19 MED ORDER — GADOBUTROL 1 MMOL/ML IV SOLN
7.5000 mL | Freq: Once | INTRAVENOUS | Status: AC | PRN
  Administered 2018-11-19: 7.5 mL via INTRAVENOUS

## 2018-11-19 MED ORDER — MAGNESIUM SULFATE 2 GM/50ML IV SOLN
2.0000 g | Freq: Once | INTRAVENOUS | Status: AC
  Administered 2018-11-19: 2 g via INTRAVENOUS
  Filled 2018-11-19: qty 50

## 2018-11-19 MED ORDER — METOPROLOL TARTRATE 5 MG/5ML IV SOLN: 2.5000 mg | Freq: Once | INTRAVENOUS | Status: DC

## 2018-11-19 MED ORDER — DIGOXIN 0.25 MG/ML IJ SOLN
0.5000 mg | Freq: Once | INTRAMUSCULAR | Status: AC
  Administered 2018-11-20: 0.5 mg via INTRAVENOUS
  Filled 2018-11-19: qty 2

## 2018-11-19 MED ORDER — MORPHINE SULFATE (PF) 4 MG/ML IV SOLN
4.0000 mg | Freq: Once | INTRAVENOUS | Status: AC
  Administered 2018-11-19: 4 mg via INTRAVENOUS
  Filled 2018-11-19: qty 1

## 2018-11-19 MED ORDER — METOPROLOL TARTRATE 5 MG/5ML IV SOLN
5.0000 mg | Freq: Once | INTRAVENOUS | Status: AC
  Administered 2018-11-19: 5 mg via INTRAVENOUS

## 2018-11-19 NOTE — H&P (Signed)
History and Physical    [name] Walker SNK:[id number] DOB: 01/31/1925 DOA: 11/19/2018  PCP: Claretta Fraise, MD   Patient coming from: Home.  I have personally briefly reviewed patient's old medical records in [place]  Chief Complaint: Generalized pain.  HPI: [name] Walker is a 85 y.o. female with medical history significant of cataract, COPD, depression, insomnia, history of DVT, history of gastric erosion, GERD, history ischemic colitis, osteoarthritis, history of parotid gland squamous cell carcinoma who is brought to the emergency department due to generalized pain, decreased mobility and incontinence (per triage notes the patient has been urinating on himself and have foul-smelling urine odor).  She is unable to elaborate further and states that she has been hurting all over for more than a month.  Apparently she has not been wearing her oxygen at home and there might be some unknown compliance issues.  There has not having any fever, rigors or cough to our knowledge.  ED Course: Her vital signs on arrival are temperature 99.4, pulse 55, respirations 20, blood pressure 90/52 mmHg and O2 sat 92% on room air.  She received a 1000 mL NS bolus, morphine 4 mg IVP x1 and ceftriaxone 1 g IVPB.  White count was 14.9 with 86% neutrophils, 3% lymphocytes and 10% monocytes.  Lactic acid has been normal x2.  CMP shows normal electrolytes, glucose of 101, BUN 15, creatinine 1.14, magnesium 1.8 and calcium 8.5 mg/dL.  Total protein was 6.2 and albumin 3.1 g/dL.  The rest of the hepatic function panel was within normal.  Imaging: CT head without contrast showed no acute findings, but there were remote lacunar infarcts in the right thalamus and right lentiform nucleus, which are unchanged.  There is chronically moist sinusitis.  There is chronic superior ophthalmic vein dilatation which is nonspecific, consider increase CVP or Graves' disease.  There is no thickening of the extraocular  muscles.  Her chest radiograph did not have any acute disease.  MRI of lumbar spine with and without contrast showed no acute finding, but there was diffuse DDD with mild to moderate spinal canal stenosis throughout most of the lumbar spine.  There was multilevel mild to moderate neuronal foraminal stenosis.  See images and full radiology report for further detail.  Review of Systems: Unable to fully obtain.   Past Medical History:  Diagnosis Date  . Cataract   . COPD (chronic obstructive pulmonary disease) ([place])   . Depression   . DVT (deep venous thrombosis) ([place])   . Gastric erosion   . GERD (gastroesophageal reflux disease)   . Hypertension   . Insomnia   . Ischemic colitis ([place])   . Osteoarthritis   . Parotid mass 2016  . 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 ABDOMINAL HYSTERECTOMY    . BACK SURGERY    . Cataracts    . CHOLECYSTECTOMY    . COLONOSCOPY    . ESOPHAGOGASTRODUODENOSCOPY  10/17/2007   erosion   . KNEE ARTHROPLASTY     bilateral  . PAROTIDECTOMY Left 05/17/2015   Procedure: PAROTIDECTOMY- LEFT TOTAL;  Surgeon: Leta Baptist, MD;  Location: [facility];  Service: ENT;  Laterality: Left;  . TOTAL HIP ARTHROPLASTY     bilateral     reports that she has never smoked. She has never used smokeless tobacco. She reports that she does not drink alcohol or use drugs.  Allergies  Allergen Reactions  .  Diltiazem Anxiety and Other (See Comments)    Sleep disturbance  . Iohexol      Desc: THROAT SWELLING-NOTED IN CHART AFTER IVC PLACEMENT-ARS 12-02-05   . Latex Other (See Comments)    unknown  . Penicillins Swelling    Patient was given zosyn this admission and tolerated. After d/w MD and pt, she had swelling in arm only, so possible just infiltration and not allergic. ABx changed to Cefazolin and is tolerating  . Shellfish Allergy Other (See Comments)    Reaction unknown     Family History  Problem Relation Age of Onset  . Rheum arthritis Mother   . Ulcers Mother        on foot  . Ulcers Father   . Cancer Sister        uterine   Prior to Admission medications   Medication Sig Start Date End Date Taking? Authorizing Provider  acetaminophen (TYLENOL) 500 MG tablet Take 500-1,000 mg by mouth every 6 (six) hours as needed for mild pain or moderate pain.    Yes [provider]  albuterol (PROVENTIL) (2.5 MG/3ML) 0.083% nebulizer solution Take 3 mLs (2.5 mg total) by nebulization every 2 (two) hours as needed for wheezing or shortness of breath. 07/31/15  Yes Isaac Bliss, Rayford Halsted, MD  amitriptyline (ELAVIL) 75 MG tablet Take 1 tablet (75 mg total) by mouth at bedtime. 06/04/18  Yes Stacks, Cletus Gash, MD  Calcium Carbonate-Vitamin D (CALCIUM-VITAMIN D) 500-200 MG-UNIT tablet Take 1 tablet by mouth every morning.   Yes [provider]  donepezil (ARICEPT) 10 MG tablet Take 1 tablet (10 mg total) by mouth at bedtime. 06/04/18  Yes Stacks, Cletus Gash, MD  ELIQUIS 5 MG TABS tablet TAKE (1) TABLET TWICE A DAY. Patient taking differently: Take 5 mg by mouth 2 (two) times daily.  10/31/18  Yes Stacks, Cletus Gash, MD  furosemide (LASIX) 40 MG tablet TAKE (2) TABLETS DAILY Patient taking differently: Take 40 mg by mouth 2 (two) times daily.  06/04/18  Yes Stacks, Cletus Gash, MD  gabapentin (NEURONTIN) 300 MG capsule TAKE (1) CAPSULE TWICE DAILY. Patient taking differently: Take 300 mg by mouth 2 (two) times daily.  06/04/18  Yes Stacks, Cletus Gash, MD  levocetirizine (XYZAL) 5 MG tablet Take 1 tablet (5 mg total) by mouth at bedtime. 06/04/18  Yes Stacks, Cletus Gash, MD  omeprazole (PRILOSEC) 20 MG capsule TAKE 2 CAPSULES DAILY AS NEEDED FOR REFLUX Patient taking differently: Take 20 mg by mouth 2 (two) times daily before a meal.  06/04/18  Yes Stacks, Cletus Gash, MD  oxyCODONE-acetaminophen (PERCOCET) 10-325 MG tablet Take 1 tablet by mouth every 8 (eight) hours as needed. 11/04/18   Yes [provider]  OXYGEN Inhale into the lungs at bedtime.   Yes [provider]  potassium chloride (K-DUR) 10 MEQ tablet Take 4 tablets (40 mEq total) by mouth daily. (Needs to be seen before next refill) Patient taking differently: Take 40 mEq by mouth every morning.  10/10/18  Yes Stacks, Cletus Gash, MD  rOPINIRole (REQUIP) 1 MG tablet TAKE 1 OR 2 TABLETS AT BEDTIME Patient taking differently: Take 1-2 mg by mouth at bedtime.  06/04/18  Yes Claretta Fraise, MD  verapamil (CALAN-SR) 180 MG CR tablet Take 1 tablet (180 mg total) by mouth at bedtime. 06/04/18  Yes Claretta Fraise, MD  Vitamin D-Vitamin K (VITAMIN K2-VITAMIN D3 PO) Take 2 capsules by mouth every morning.   Yes [provider]    Physical Exam: Vitals:   11/19/18 1830 11/19/18 1832  11/19/18 1900 11/19/18 2000  BP: (!) 157/130 (!) 95/45    Pulse: 79 94 99   Resp: (!) 25 (!) 22 (!) 22   Temp:      TempSrc:      SpO2: (!) 78% 90% 97% 97%  Weight:      Height:        Constitutional: Acutely ill looking, frail early female. Eyes: PERRL, lids and conjunctivae are mildly injected. ENMT: Mucous membranes and lips are dry. Posterior pharynx clear of any exudate or lesions.  Neck: normal, supple, no JVD, no masses, no thyromegaly Respiratory: Bibasilar crackles.  No wheezing or rhonchi.  Mildly tachypneic in the low to mid 20s.  No accessory muscle use.  Cardiovascular: Tachycardic ranging from the 110s to the 160s with an irregularly irregular rhythm, no murmurs / rubs / gallops.  Stage I lymphedema.  No extremity pitting edema. 2+ pedal pulses. No carotid bruits.  Abdomen: Soft, mild epigastric and suprapubic tenderness, no guarding, no masses palpated. No hepatosplenomegaly. Bowel sounds positive.  Musculoskeletal: no clubbing / cyanosis. Good ROM, no contractures. Normal muscle tone.  Skin: Chronic hyperpigmentation and hyperkeratosis of pretibial area. Neurologic: CN 2-12 grossly intact. Sensation  intact, DTR normal. Strength 5/5 in all 4.  Psychiatric: Alert and oriented x 2, disoriented to time/date. Normal mood.   Labs on Admission: I have personally reviewed following labs and imaging studies  CBC: Recent Labs  Lab 11/19/18 1235  WBC 14.9*  NEUTROABS 12.8*  HGB 10.7*  HCT 34.8*  MCV 99.7  PLT 250   Basic Metabolic Panel: Recent Labs  Lab 11/19/18 1235  NA 137  K 3.8  CL 103  CO2 24  GLUCOSE 101*  BUN 15  CREATININE 1.14*  CALCIUM 8.5*  MG 1.8   GFR: Estimated Creatinine Clearance: 29.7 mL/min (A) (by C-G formula based on SCr of 1.14 mg/dL (H)). Liver Function Tests: Recent Labs  Lab 11/19/18 1235  AST 21  ALT 11  ALKPHOS 123  BILITOT 0.8  PROT 6.2*  ALBUMIN 3.1*   No results for input(s): LIPASE, AMYLASE in the last 168 hours. No results for input(s): AMMONIA in the last 168 hours. Coagulation Profile: No results for input(s): INR, PROTIME in the last 168 hours. Cardiac Enzymes: No results for input(s): CKTOTAL, CKMB, CKMBINDEX, TROPONINI in the last 168 hours. BNP (last 3 results) No results for input(s): PROBNP in the last 8760 hours. HbA1C: No results for input(s): HGBA1C in the last 72 hours. CBG: No results for input(s): GLUCAP in the last 168 hours. Lipid Profile: No results for input(s): CHOL, HDL, LDLCALC, TRIG, CHOLHDL, LDLDIRECT in the last 72 hours. Thyroid Function Tests: Recent Labs    11/19/18 1252  TSH 1.196   Anemia Panel: No results for input(s): VITAMINB12, FOLATE, FERRITIN, TIBC, IRON, RETICCTPCT in the last 72 hours. Urine analysis:    Component Value Date/Time   COLORURINE AMBER (A) 11/19/2018 1230   APPEARANCEUR CLOUDY (A) 11/19/2018 1230   LABSPEC 1.019 11/19/2018 1230   PHURINE 5.0 11/19/2018 1230   GLUCOSEU NEGATIVE 11/19/2018 1230   HGBUR MODERATE (A) 11/19/2018 1230   BILIRUBINUR NEGATIVE 11/19/2018 1230   KETONESUR NEGATIVE 11/19/2018 1230   PROTEINUR 100 (A) 11/19/2018 1230   UROBILINOGEN 0.2  01/19/2013 0040   NITRITE NEGATIVE 11/19/2018 1230   LEUKOCYTESUR MODERATE (A) 11/19/2018 1230    Radiological Exams on Admission: Dg Chest 2 View  Result Date: 11/19/2018 CLINICAL DATA:  Fever. EXAM: CHEST - 2 VIEW COMPARISON:  PA and  lateral chest 03/24/2018. FINDINGS: No consolidative process, pneumothorax or effusion. Heart size is enlarged. Aortic atherosclerosis noted. Advanced degenerative change about the shoulders. IMPRESSION: No acute disease. Atherosclerosis. Electronically Signed   By: Inge Rise M.D.   On: 11/19/2018 14:10   Ct Head Wo Contrast  Result Date: 11/19/2018 CLINICAL DATA:  Altered level of consciousness EXAM: CT HEAD WITHOUT CONTRAST TECHNIQUE: Contiguous axial images were obtained from the base of the skull through the vertex without intravenous contrast. COMPARISON:  05/23/2016 FINDINGS: Brain: Remote lacunar infarcts in the right thalamus and right lentiform nucleus, no change from 05/23/2016. Periventricular white matter and corona radiata hypodensities favor chronic ischemic microvascular white matter disease. Otherwise, the brainstem, cerebellum, cerebral peduncles, thalamus, basal ganglia, basilar cisterns, and ventricular system appear within normal limits. No intracranial hemorrhage, mass lesion, or acute CVA. Vascular: There is atherosclerotic calcification of the cavernous carotid arteries bilaterally. Skull: Unremarkable Sinuses/Orbits: Mild chronic ethmoid sinusitis. Bilateral chronic superior ophthalmic vein dilatation. Other: No supplemental non-categorized findings. IMPRESSION: 1. Remote lacunar infarcts in the right thalamus and right lentiform nucleus, not changed from 2017. 2. Chronic ethmoid sinusitis. 3. Bilateral chronic superior ophthalmic vein dilatation, nonspecific. This can be associated with ophthalmic vein varices, raised intracranial pressure, or Graves disease. No thickening of the extraocular muscles observed. 4. No acute findings.  Electronically Signed   By: Van Clines M.D.   On: 11/19/2018 14:37   Mr Lumbar Spine W Wo Contrast  Result Date: 11/19/2018 CLINICAL DATA:  Increased pain and decreased mobility EXAM: MRI LUMBAR SPINE WITHOUT AND WITH CONTRAST TECHNIQUE: Multiplanar and multiecho pulse sequences of the lumbar spine were obtained without and with intravenous contrast. CONTRAST:  7.5 mL Gadavist COMPARISON:  PET CT 06/09/2015 Lumbar spine CT 05/30/2013 FINDINGS: Segmentation: Normal. The lowest disc space is considered to be L5-S1. Alignment:  Mild sigmoid scoliosis of lumbar spine. Vertebrae: No acute compression fracture, discitis-osteomyelitis of focal marrow lesion. Conus medullaris and cauda equina: The conus medullaris terminates at the L1 level. The cauda equina and conus medullaris are both normal. Paraspinal and other soft tissues: The visualized retroperitoneal organs and paraspinal soft tissues are normal. Disc levels: Sagittal plane imaging includes the T10-11 disc level through the upper sacrum, with axial imaging of the T12-L1 to L5-S1 disc levels. T10-11: Disc space narrowing without spinal canal stenosis. T11-12: Disc space narrowing without spinal canal stenosis. T12-L1: Disc space narrowing with diffuse bulge and mild spinal canal stenosis. Mild right and moderate left neural foraminal stenosis. L1-2: Disc space narrowing and mild bulge. Mild spinal canal stenosis. Moderate left and mild right neural foraminal stenosis. L2-3: Disc space narrowing with left eccentric bulge. Mild spinal canal stenosis, greatest in the left lateral recess. Mild right and moderate left neural foraminal stenosis. L3-4: Diffuse disc bulge with moderate spinal canal stenosis. Mild facet hypertrophy. Mild bilateral neural foraminal stenosis. L4-5: Disc space narrowing with mild disc bulge. No spinal canal stenosis. Moderate right foraminal stenosis. L5-S1: Disc desiccation and height loss with mild bulge. No spinal canal  stenosis. Mild left foraminal stenosis. The visualized portion of the sacrum is normal. IMPRESSION: 1. Diffuse degenerative disc disease with mild-to-moderate spinal canal stenosis throughout most of the lumbar spine. 2. Multilevel mild-to-moderate neural foraminal stenosis. 3. No acute abnormality of the lumbar spine. Electronically Signed   By: Ulyses Jarred M.D.   On: 11/19/2018 16:48   07/20/2015 echo ------------------------------------------------------------------- LV EF: 55% -   60%  ------------------------------------------------------------------- Indications:      Atrial fibrillation - 427.31.  -------------------------------------------------------------------  History:   PMH:  Anemia, Cellulitis.  Atrial fibrillation.  Chronic obstructive pulmonary disease.  Risk factors:  Hypertension.  ------------------------------------------------------------------- Study Conclusions  - Left ventricle: The cavity size was normal. There was moderate   concentric hypertrophy. Systolic function was normal. The   estimated ejection fraction was in the range of 55% to 60%.   Images were inadequate for LV wall motion assessment. However, no   gross regional variation seen on apical 4-chamber views. The   study was not technically sufficient to allow evaluation of LV   diastolic dysfunction due to atrial fibrillation. - Aortic valve: Moderately calcified annulus. Mildly thickened,   mildly calcified leaflets. Morphologically, there appears to be   at least mild stenosis. Full hemodynamic assessment was not   performed. - Mitral valve: Moderately calcified annulus. There was mild   regurgitation. - Left atrium: The atrium was moderately to severely dilated. - Tricuspid valve: There was mild regurgitation.  EKG: Independently reviewed.  Vent. rate 101 BPM PR interval * ms QRS duration 112 ms QT/QTc 386/501 ms P-R-T axes * 43 35 Atrial fibrillation Incomplete right bundle branch  block Prolonged QT interval  Assessment/Plan Principal Problem:   Sepsis due to urinary tract infection ([place]) Admit to stepdown/inpatient. Continue supplemental oxygen. Continue time-limited IV fluids. Continue ceftriaxone 1 g IVPB every 24 hours. Follow-up blood cultures and sensitivity. Follow-up urine culture and sensitivity.  Active Problems:   Atrial fibrillation with RVR (HCC) CHA?DS?-VASc Score of at least 5. Continue Eliquis twice daily. She was not given her verapamil earlier due to hypotension. Metoprolol 5 mg IVP x1 given when the heart rate was in the 160s. However, although it has decreased her heart rate to the 1 teens and 120s, she is still hypotensive with her systolic pressures in the 90s.  Magnesium sulfate 2 g IVPB was given earlier.  Trial of digoxin 0.5 mg IVP x1 dose, outside with not to continue metoprolol due to hypotension and no fresh EF measurement on echo.  Check BNP and obtain a new echocardiogram in a.m.    Depression, recurrent (HCC) Continue amitriptyline 75 mg p.o. at bedtime.    Essential hypertension Hold verapamil until a.m. Monitor blood pressure and heart rate.    COPD (chronic obstructive pulmonary disease) (HCC) Continue supplemental oxygen. Xopenex nebs every 6 hours as needed.    GERD Protonix 40 mg p.o. daily.     DVT prophylaxis: Eliquis BID. Code Status: DNR. Family Communication: Disposition Plan: Admit for IV hydration and IV antibiotics for 2 to 3 days. Consults called: Admission status: Inpatient/   Reubin Milan MD [facility]  11/19/2018, 8:39 PM

## 2018-11-19 NOTE — ED Notes (Signed)
Patient transported to MRI 

## 2018-11-19 NOTE — ED Triage Notes (Signed)
Pt brought in by EMS due to increase pain all over which has increased over the past 3 days. Reported decreased mobility and pts was sitting in urine. Urine foul smelling.

## 2018-11-19 NOTE — ED Provider Notes (Signed)
Emergency Department Provider Note   I have reviewed the triage vital signs and the nursing notes.   HISTORY  Chief Complaint Body pain   HPI [name] Walker is a 85 y.o. female with multiple medical problems documented below and on multiple medications as documented as well as presents the emergency department today with "body pain".  This seems that the patient fell back in July and had shoulder pain but that is a progressively worsening all over pain since that time.  Nothing specific.  She has not had any cough or strokelike symptoms.  She supposed to wear oxygen at night but has not.  Still that she is had pain so bad to the point that she does not feel like she can move.  She has been eating and drinking normally.  No known fevers.  Patient not able to offer much else in the way of history or review of systems. No other associated or modifying symptoms.    Past Medical History:  Diagnosis Date  . Cataract   . COPD (chronic obstructive pulmonary disease) ([place])   . Depression   . DVT (deep venous thrombosis) ([place])   . Gastric erosion   . GERD (gastroesophageal reflux disease)   . Hypertension   . Insomnia   . Ischemic colitis ([place])   . Osteoarthritis   . Parotid mass 2016  . 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 Sepsis due to urinary tract infection ([place]) 11/19/2018  . Primary osteoarthritis of right shoulder 07/10/2018  . Primary osteoarthritis involving multiple joints 03/03/2018  . Imbalance 03/03/2018  . Frequent falls 03/03/2018  . Supplemental oxygen dependent 07/14/2016  . C5 vertebral fracture ([place]) 05/25/2016  . Hypokalemia 05/23/2016  . B12 deficiency 08/24/2015  . Insomnia 08/15/2015  . 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 Atrial fibrillation, chronic 07/22/2015  . Atrial fibrillation with RVR ([place]) 07/13/2015  . H/O parotidectomy  05/17/2015  . Osteopenia 05/13/2015  . Weakness 07/15/2013  . Anemia 01/06/2009  . Depression, recurrent ([place]) 01/06/2009  . Essential hypertension 01/06/2009  . COPD (chronic obstructive pulmonary disease) ([place]) 01/06/2009  . GERD 01/06/2009  . IRRITABLE BOWEL SYNDROME 01/06/2009    Past Surgical History:  Procedure Laterality Date  . ABDOMINAL HYSTERECTOMY    . BACK SURGERY    . Cataracts    . CHOLECYSTECTOMY    . COLONOSCOPY    . ESOPHAGOGASTRODUODENOSCOPY  10/17/2007   erosion   . KNEE ARTHROPLASTY     bilateral  . PAROTIDECTOMY Left 05/17/2015   Procedure: PAROTIDECTOMY- LEFT TOTAL;  Surgeon: Leta Baptist, MD;  Location: [facility];  Service: ENT;  Laterality: Left;  . TOTAL HIP ARTHROPLASTY     bilateral      Allergies Diltiazem; Iohexol; Latex; Penicillins; and Shellfish allergy  Family History  Problem Relation Age of Onset  . Rheum arthritis Mother   . Ulcers Mother        on foot  . Ulcers Father   . Cancer Sister        uterine    Social History Social History   Tobacco Use  . Smoking status: Never Smoker  . Smokeless tobacco: Never Used  Substance Use Topics  . Alcohol use: No  . Drug use: No    Review of Systems  All other systems negative except as documented in the HPI. All pertinent positives  and negatives as reviewed in the HPI. ____________________________________________   PHYSICAL EXAM:  VITAL SIGNS: ED Triage Vitals  Enc Vitals Group     BP 11/19/18 1219 (!) 90/52     Pulse Rate 11/19/18 1219 (!) 55     Resp 11/19/18 1219 20     Temp 11/19/18 1219 99.4 F (37.4 C)     Temp Source 11/19/18 1219 Oral     SpO2 11/19/18 1219 92 %     Weight 11/19/18 1215 180 lb (81.6 kg)     Height 11/19/18 1215 4\' 11"  (1.499 m)    Constitutional: Alert and oriented. Well appearing and in no acute distress. Eyes: Conjunctivae are normal. PERRL. EOMI. Head: Atraumatic. Nose: No congestion/rhinnorhea. Mouth/Throat: Mucous membranes  are dry.  Oropharynx non-erythematous. Neck: No stridor.  No meningeal signs.   Cardiovascular: Normal rate, regular rhythm. Good peripheral circulation. Grossly normal heart sounds.   Respiratory: Normal respiratory effort.  No retractions. Lungs CTAB. Gastrointestinal: Soft and nontender. No distention.  Musculoskeletal: No lower extremity tenderness nor edema. No gross deformities of extremities. Neurologic:  Normal speech and language. No gross focal neurologic deficits are appreciated.  Skin:  Skin is warm, dry and intact. No rash noted. Decreased skin turgor.  ____________________________________________   LABS (all labs ordered are listed, but only abnormal results are displayed)  Labs Reviewed  MRSA PCR SCREENING - Abnormal; Notable for the following components:      Result Value   MRSA by PCR POSITIVE (*)    All other components within normal limits  COMPREHENSIVE METABOLIC PANEL - Abnormal; Notable for the following components:   Glucose, Bld 101 (*)    Creatinine, Ser 1.14 (*)    Calcium 8.5 (*)    Total Protein 6.2 (*)    Albumin 3.1 (*)    GFR calc non Af Amer 42 (*)    GFR calc Af Amer 49 (*)    All other components within normal limits  CBC WITH DIFFERENTIAL/PLATELET - Abnormal; Notable for the following components:   WBC 14.9 (*)    RBC 3.49 (*)    Hemoglobin 10.7 (*)    HCT 34.8 (*)    Neutro Abs 12.8 (*)    Lymphs Abs 0.4 (*)    Monocytes Absolute 1.5 (*)    Abs Immature Granulocytes 0.12 (*)    All other components within normal limits  URINALYSIS, ROUTINE W REFLEX MICROSCOPIC - Abnormal; Notable for the following components:   Color, Urine AMBER (*)    APPearance CLOUDY (*)    Hgb urine dipstick MODERATE (*)    Protein, ur 100 (*)    Leukocytes,Ua MODERATE (*)    RBC / HPF >50 (*)    WBC, UA >50 (*)    Bacteria, UA RARE (*)    All other components within normal limits  CBC WITH DIFFERENTIAL/PLATELET - Abnormal; Notable for the following components:     WBC 21.2 (*)    RBC 3.43 (*)    Hemoglobin 10.7 (*)    HCT 34.9 (*)    MCV 101.7 (*)    Neutro Abs 17.4 (*)    Lymphs Abs 0.5 (*)    Monocytes Absolute 1.4 (*)    Abs Immature Granulocytes 1.79 (*)    All other components within normal limits  BASIC METABOLIC PANEL - Abnormal; Notable for the following components:   Glucose, Bld 164 (*)    Creatinine, Ser 1.53 (*)    Calcium 8.3 (*)    GFR  calc non Af Amer 29 (*)    GFR calc Af Amer 34 (*)    All other components within normal limits  BRAIN NATRIURETIC PEPTIDE - Abnormal; Notable for the following components:   B Natriuretic Peptide 948.0 (*)    All other components within normal limits  CULTURE, BLOOD (ROUTINE X 2)  CULTURE, BLOOD (ROUTINE X 2)  URINE CULTURE  EXPECTORATED SPUTUM ASSESSMENT W REFEX TO RESP CULTURE  GRAM STAIN  BLOOD CULTURE ID PANEL (REFLEXED)  LACTIC ACID, PLASMA  LACTIC ACID, PLASMA  INFLUENZA PANEL BY PCR (TYPE A & B)  MAGNESIUM  TSH  STREP PNEUMONIAE URINARY ANTIGEN   ____________________________________________  EKG   EKG Interpretation  Date/Time:    Ventricular Rate:    PR Interval:    QRS Duration:   QT Interval:    QTC Calculation:   R Axis:     Text Interpretation:         ____________________________________________  RADIOLOGY  Dg Chest 2 View  Result Date: 11/19/2018 CLINICAL DATA:  Fever. EXAM: CHEST - 2 VIEW COMPARISON:  PA and lateral chest 03/24/2018. FINDINGS: No consolidative process, pneumothorax or effusion. Heart size is enlarged. Aortic atherosclerosis noted. Advanced degenerative change about the shoulders. IMPRESSION: No acute disease. Atherosclerosis. Electronically Signed   By: Inge Rise M.D.   On: 11/19/2018 14:10   Ct Head Wo Contrast  Result Date: 11/19/2018 CLINICAL DATA:  Altered level of consciousness EXAM: CT HEAD WITHOUT CONTRAST TECHNIQUE: Contiguous axial images were obtained from the base of the skull through the vertex without intravenous  contrast. COMPARISON:  05/23/2016 FINDINGS: Brain: Remote lacunar infarcts in the right thalamus and right lentiform nucleus, no change from 05/23/2016. Periventricular white matter and corona radiata hypodensities favor chronic ischemic microvascular white matter disease. Otherwise, the brainstem, cerebellum, cerebral peduncles, thalamus, basal ganglia, basilar cisterns, and ventricular system appear within normal limits. No intracranial hemorrhage, mass lesion, or acute CVA. Vascular: There is atherosclerotic calcification of the cavernous carotid arteries bilaterally. Skull: Unremarkable Sinuses/Orbits: Mild chronic ethmoid sinusitis. Bilateral chronic superior ophthalmic vein dilatation. Other: No supplemental non-categorized findings. IMPRESSION: 1. Remote lacunar infarcts in the right thalamus and right lentiform nucleus, not changed from 2017. 2. Chronic ethmoid sinusitis. 3. Bilateral chronic superior ophthalmic vein dilatation, nonspecific. This can be associated with ophthalmic vein varices, raised intracranial pressure, or Graves disease. No thickening of the extraocular muscles observed. 4. No acute findings. Electronically Signed   By: Van Clines M.D.   On: 11/19/2018 14:37   Mr Lumbar Spine W Wo Contrast  Result Date: 11/19/2018 CLINICAL DATA:  Increased pain and decreased mobility EXAM: MRI LUMBAR SPINE WITHOUT AND WITH CONTRAST TECHNIQUE: Multiplanar and multiecho pulse sequences of the lumbar spine were obtained without and with intravenous contrast. CONTRAST:  7.5 mL Gadavist COMPARISON:  PET CT 06/09/2015 Lumbar spine CT 05/30/2013 FINDINGS: Segmentation: Normal. The lowest disc space is considered to be L5-S1. Alignment:  Mild sigmoid scoliosis of lumbar spine. Vertebrae: No acute compression fracture, discitis-osteomyelitis of focal marrow lesion. Conus medullaris and cauda equina: The conus medullaris terminates at the L1 level. The cauda equina and conus medullaris are both normal.  Paraspinal and other soft tissues: The visualized retroperitoneal organs and paraspinal soft tissues are normal. Disc levels: Sagittal plane imaging includes the T10-11 disc level through the upper sacrum, with axial imaging of the T12-L1 to L5-S1 disc levels. T10-11: Disc space narrowing without spinal canal stenosis. T11-12: Disc space narrowing without spinal canal stenosis. T12-L1: Disc space narrowing with  diffuse bulge and mild spinal canal stenosis. Mild right and moderate left neural foraminal stenosis. L1-2: Disc space narrowing and mild bulge. Mild spinal canal stenosis. Moderate left and mild right neural foraminal stenosis. L2-3: Disc space narrowing with left eccentric bulge. Mild spinal canal stenosis, greatest in the left lateral recess. Mild right and moderate left neural foraminal stenosis. L3-4: Diffuse disc bulge with moderate spinal canal stenosis. Mild facet hypertrophy. Mild bilateral neural foraminal stenosis. L4-5: Disc space narrowing with mild disc bulge. No spinal canal stenosis. Moderate right foraminal stenosis. L5-S1: Disc desiccation and height loss with mild bulge. No spinal canal stenosis. Mild left foraminal stenosis. The visualized portion of the sacrum is normal. IMPRESSION: 1. Diffuse degenerative disc disease with mild-to-moderate spinal canal stenosis throughout most of the lumbar spine. 2. Multilevel mild-to-moderate neural foraminal stenosis. 3. No acute abnormality of the lumbar spine. Electronically Signed   By: Ulyses Jarred M.D.   On: 11/19/2018 16:48    ____________________________________________   PROCEDURES  Procedure(s) performed:   Procedures   ____________________________________________   INITIAL IMPRESSION / ASSESSMENT AND PLAN / ED COURSE  Clinically dehydrated with some soft blood pressures.  Will give some fluids.  Apparently she has foul-smelling urine and increased weakness so we will check to make sure she does have an infection or sepsis.   No focal deficits or disorientation to suggest stroke at this time however with her feeling pain all over and somewhat poor historian we will get a CT of her head to make sure there is no head bleed or other issues.  Possibly UTI? No e/o cauda equina. Somewhat improved with fluids. Still feeling weak, both extremities, doubt CVA.   Intermittent increases in HR above 120. Soft pressures. Will give night time dose of verapamil and admit for furhter hydration, HR control and observation.      Pertinent labs & imaging results that were available during my care of the patient were reviewed by me and considered in my medical decision making (see chart for details).  ____________________________________________  FINAL CLINICAL IMPRESSION(S) / ED DIAGNOSES  Final diagnoses:  Acute cystitis without hematuria     MEDICATIONS GIVEN DURING THIS VISIT:  Medications  cefTRIAXone (ROCEPHIN) 1 g in sodium chloride 0.9 % 100 mL IVPB (has no administration in time range)  0.9 % NaCl with KCl 20 mEq/ L  infusion ( Intravenous Stopped 11/20/18 0137)  acetaminophen (TYLENOL) tablet 650 mg (650 mg Oral Given 11/20/18 0138)    Or  acetaminophen (TYLENOL) suppository 650 mg ( Rectal See Alternative 11/20/18 0138)  amitriptyline (ELAVIL) tablet 75 mg (has no administration in time range)  calcium-vitamin D 500-200 MG-UNIT per tablet 1 tablet (has no administration in time range)  donepezil (ARICEPT) tablet 10 mg (has no administration in time range)  apixaban (ELIQUIS) tablet 5 mg (5 mg Oral Given 11/20/18 0418)  gabapentin (NEURONTIN) capsule 300 mg (300 mg Oral Given 11/20/18 0417)  loratadine (CLARITIN) tablet 10 mg (has no administration in time range)  pantoprazole (PROTONIX) EC tablet 40 mg (has no administration in time range)  rOPINIRole (REQUIP) tablet 1-2 mg (has no administration in time range)  levalbuterol (XOPENEX) nebulizer solution 0.63 mg (has no administration in time range)  levalbuterol  (XOPENEX) nebulizer solution 0.63 mg (0.63 mg Nebulization Given 11/20/18 0130)  ipratropium (ATROVENT) nebulizer solution 0.5 mg (0.5 mg Nebulization Given 11/20/18 0130)  nystatin (MYCOSTATIN/NYSTOP) topical powder (has no administration in time range)  oxyCODONE-acetaminophen (PERCOCET/ROXICET) 5-325 MG per tablet 1 tablet (has no  administration in time range)    And  oxyCODONE (Oxy IR/ROXICODONE) immediate release tablet 5 mg (has no administration in time range)  lactated ringers bolus 1,000 mL (0 mLs Intravenous Stopped 11/19/18 1815)  gadobutrol (GADAVIST) 1 MMOL/ML injection 7.5 mL (7.5 mLs Intravenous Contrast Given 11/19/18 1623)  morphine 4 MG/ML injection 4 mg (4 mg Intravenous Given 11/19/18 1829)  cefTRIAXone (ROCEPHIN) 1 g in sodium chloride 0.9 % 100 mL IVPB (0 g Intravenous Stopped 11/19/18 2037)  magnesium sulfate IVPB 2 g 50 mL (0 g Intravenous Stopped 11/19/18 2237)  metoprolol tartrate (LOPRESSOR) injection 5 mg (5 mg Intravenous Given 11/19/18 2314)  digoxin (LANOXIN) 0.25 MG/ML injection 0.5 mg (0.5 mg Intravenous Given 11/20/18 0100)  midodrine (PROAMATINE) tablet 10 mg (10 mg Oral Given 11/20/18 0100)  methylPREDNISolone sodium succinate (SOLU-MEDROL) 40 mg/mL injection 40 mg (40 mg Intravenous Given 11/20/18 0141)     NEW OUTPATIENT MEDICATIONS STARTED DURING THIS VISIT:  Current Discharge Medication List      Note:  This note was prepared with assistance of Dragon voice recognition software. Occasional wrong-word or sound-a-like substitutions may have occurred due to the inherent limitations of voice recognition software.   Raini Tiley, Corene Cornea, MD 11/20/18 [phone]

## 2018-11-20 ENCOUNTER — Encounter (HOSPITAL_COMMUNITY): Payer: Self-pay | Admitting: *Deleted

## 2018-11-20 ENCOUNTER — Other Ambulatory Visit: Payer: Self-pay

## 2018-11-20 ENCOUNTER — Inpatient Hospital Stay (HOSPITAL_COMMUNITY): Payer: Medicare Other

## 2018-11-20 DIAGNOSIS — I340 Nonrheumatic mitral (valve) insufficiency: Secondary | ICD-10-CM

## 2018-11-20 DIAGNOSIS — I361 Nonrheumatic tricuspid (valve) insufficiency: Secondary | ICD-10-CM

## 2018-11-20 DIAGNOSIS — N179 Acute kidney failure, unspecified: Secondary | ICD-10-CM | POA: Diagnosis present

## 2018-11-20 DIAGNOSIS — R7881 Bacteremia: Secondary | ICD-10-CM

## 2018-11-20 DIAGNOSIS — I4891 Unspecified atrial fibrillation: Secondary | ICD-10-CM

## 2018-11-20 DIAGNOSIS — J439 Emphysema, unspecified: Secondary | ICD-10-CM

## 2018-11-20 DIAGNOSIS — I10 Essential (primary) hypertension: Secondary | ICD-10-CM

## 2018-11-20 LAB — BLOOD CULTURE ID PANEL (REFLEXED)
Acinetobacter baumannii: NOT DETECTED
Candida albicans: NOT DETECTED
Candida glabrata: NOT DETECTED
Candida krusei: NOT DETECTED
Candida parapsilosis: NOT DETECTED
Candida tropicalis: NOT DETECTED
Carbapenem resistance: NOT DETECTED
Enterobacter cloacae complex: NOT DETECTED
Enterobacteriaceae species: DETECTED — AB
Enterococcus species: NOT DETECTED
Escherichia coli: DETECTED — AB
Haemophilus influenzae: NOT DETECTED
Klebsiella oxytoca: NOT DETECTED
Klebsiella pneumoniae: NOT DETECTED
Listeria monocytogenes: NOT DETECTED
NEISSERIA MENINGITIDIS: NOT DETECTED
PSEUDOMONAS AERUGINOSA: NOT DETECTED
Proteus species: NOT DETECTED
SERRATIA MARCESCENS: NOT DETECTED
STREPTOCOCCUS AGALACTIAE: NOT DETECTED
Staphylococcus aureus (BCID): NOT DETECTED
Staphylococcus species: NOT DETECTED
Streptococcus pneumoniae: NOT DETECTED
Streptococcus pyogenes: NOT DETECTED
Streptococcus species: NOT DETECTED

## 2018-11-20 LAB — CBC WITH DIFFERENTIAL/PLATELET
Abs Immature Granulocytes: 1.79 10*3/uL — ABNORMAL HIGH (ref 0.00–0.07)
BASOS ABS: 0.1 10*3/uL (ref 0.0–0.1)
Basophils Relative: 0 %
Eosinophils Absolute: 0.2 10*3/uL (ref 0.0–0.5)
Eosinophils Relative: 1 %
HCT: 34.9 % — ABNORMAL LOW (ref 36.0–46.0)
Hemoglobin: 10.7 g/dL — ABNORMAL LOW (ref 12.0–15.0)
Immature Granulocytes: 8 %
Lymphocytes Relative: 2 %
Lymphs Abs: 0.5 10*3/uL — ABNORMAL LOW (ref 0.7–4.0)
MCH: 31.2 pg (ref 26.0–34.0)
MCHC: 30.7 g/dL (ref 30.0–36.0)
MCV: 101.7 fL — ABNORMAL HIGH (ref 80.0–100.0)
Monocytes Absolute: 1.4 10*3/uL — ABNORMAL HIGH (ref 0.1–1.0)
Monocytes Relative: 7 %
Neutro Abs: 17.4 10*3/uL — ABNORMAL HIGH (ref 1.7–7.7)
Neutrophils Relative %: 82 %
Platelets: 208 10*3/uL (ref 150–400)
RBC: 3.43 MIL/uL — ABNORMAL LOW (ref 3.87–5.11)
RDW: 13.7 % (ref 11.5–15.5)
WBC Morphology: INCREASED
WBC: 21.2 10*3/uL — ABNORMAL HIGH (ref 4.0–10.5)
nRBC: 0 % (ref 0.0–0.2)

## 2018-11-20 LAB — BASIC METABOLIC PANEL
Anion gap: 10 (ref 5–15)
BUN: 20 mg/dL (ref 8–23)
CO2: 23 mmol/L (ref 22–32)
CREATININE: 1.53 mg/dL — AB (ref 0.44–1.00)
Calcium: 8.3 mg/dL — ABNORMAL LOW (ref 8.9–10.3)
Chloride: 105 mmol/L (ref 98–111)
GFR calc Af Amer: 34 mL/min — ABNORMAL LOW (ref >60)
GFR calc non Af Amer: 29 mL/min — ABNORMAL LOW (ref >60)
Glucose, Bld: 164 mg/dL — ABNORMAL HIGH (ref 70–99)
Potassium: 4.2 mmol/L (ref 3.5–5.1)
Sodium: 138 mmol/L (ref 135–145)

## 2018-11-20 LAB — ECHOCARDIOGRAM COMPLETE
Height: 57 in
Weight: 2857.16 oz

## 2018-11-20 LAB — MRSA PCR SCREENING: MRSA by PCR: POSITIVE — AB

## 2018-11-20 LAB — BRAIN NATRIURETIC PEPTIDE: B Natriuretic Peptide: 948 pg/mL — ABNORMAL HIGH (ref 0.0–100.0)

## 2018-11-20 MED ORDER — LEVALBUTEROL HCL 0.63 MG/3ML IN NEBU
0.6300 mg | INHALATION_SOLUTION | Freq: Four times a day (QID) | RESPIRATORY_TRACT | Status: DC
  Administered 2018-11-20 – 2018-11-22 (×11): 0.63 mg via RESPIRATORY_TRACT
  Filled 2018-11-20 (×12): qty 3

## 2018-11-20 MED ORDER — GUAIFENESIN-DM 100-10 MG/5ML PO SYRP
5.0000 mL | ORAL_SOLUTION | ORAL | Status: DC | PRN
  Administered 2018-11-20 – 2018-11-21 (×2): 5 mL via ORAL
  Filled 2018-11-20 (×2): qty 5

## 2018-11-20 MED ORDER — AMITRIPTYLINE HCL 25 MG PO TABS
75.0000 mg | ORAL_TABLET | Freq: Every day | ORAL | Status: DC
  Administered 2018-11-20 – 2018-11-23 (×4): 75 mg via ORAL
  Filled 2018-11-20 (×4): qty 3

## 2018-11-20 MED ORDER — CHLORHEXIDINE GLUCONATE CLOTH 2 % EX PADS
6.0000 | MEDICATED_PAD | Freq: Every day | CUTANEOUS | Status: DC
  Administered 2018-11-20 – 2018-11-24 (×3): 6 via TOPICAL

## 2018-11-20 MED ORDER — LEVOFLOXACIN IN D5W 750 MG/150ML IV SOLN: 750.0000 mg | INTRAVENOUS | Status: DC

## 2018-11-20 MED ORDER — NYSTATIN 100000 UNIT/GM EX POWD
Freq: Three times a day (TID) | CUTANEOUS | Status: DC
  Administered 2018-11-20 – 2018-11-24 (×13): via TOPICAL
  Filled 2018-11-20 (×2): qty 15

## 2018-11-20 MED ORDER — GABAPENTIN 300 MG PO CAPS
300.0000 mg | ORAL_CAPSULE | Freq: Two times a day (BID) | ORAL | Status: DC
  Administered 2018-11-20 – 2018-11-24 (×10): 300 mg via ORAL
  Filled 2018-11-20 (×10): qty 1

## 2018-11-20 MED ORDER — METHYLPREDNISOLONE SODIUM SUCC 40 MG IJ SOLR
40.0000 mg | Freq: Once | INTRAMUSCULAR | Status: AC
  Administered 2018-11-20: 40 mg via INTRAVENOUS
  Filled 2018-11-20: qty 1

## 2018-11-20 MED ORDER — OXYCODONE HCL 5 MG PO TABS
5.0000 mg | ORAL_TABLET | Freq: Three times a day (TID) | ORAL | Status: DC | PRN
  Administered 2018-11-20 – 2018-11-24 (×7): 5 mg via ORAL
  Filled 2018-11-20 (×7): qty 1

## 2018-11-20 MED ORDER — IPRATROPIUM BROMIDE 0.02 % IN SOLN
0.5000 mg | Freq: Four times a day (QID) | RESPIRATORY_TRACT | Status: DC
  Administered 2018-11-20 – 2018-11-22 (×11): 0.5 mg via RESPIRATORY_TRACT
  Filled 2018-11-20 (×12): qty 2.5

## 2018-11-20 MED ORDER — PANTOPRAZOLE SODIUM 40 MG PO TBEC
40.0000 mg | DELAYED_RELEASE_TABLET | Freq: Every day | ORAL | Status: DC
  Administered 2018-11-20 – 2018-11-24 (×5): 40 mg via ORAL
  Filled 2018-11-20 (×5): qty 1

## 2018-11-20 MED ORDER — IPRATROPIUM BROMIDE 0.02 % IN SOLN: 0.5000 mg | Freq: Four times a day (QID) | RESPIRATORY_TRACT | Status: DC

## 2018-11-20 MED ORDER — ROPINIROLE HCL 1 MG PO TABS
1.0000 mg | ORAL_TABLET | Freq: Every day | ORAL | Status: DC
  Administered 2018-11-20: 2 mg via ORAL
  Administered 2018-11-21 – 2018-11-23 (×3): 1 mg via ORAL
  Filled 2018-11-20 (×6): qty 2

## 2018-11-20 MED ORDER — LEVALBUTEROL HCL 1.25 MG/0.5ML IN NEBU: 1.2500 mg | INHALATION_SOLUTION | Freq: Four times a day (QID) | RESPIRATORY_TRACT | Status: DC

## 2018-11-20 MED ORDER — DONEPEZIL HCL 5 MG PO TABS
10.0000 mg | ORAL_TABLET | Freq: Every day | ORAL | Status: DC
  Administered 2018-11-20 – 2018-11-23 (×4): 10 mg via ORAL
  Filled 2018-11-20 (×4): qty 2

## 2018-11-20 MED ORDER — LORATADINE 10 MG PO TABS
10.0000 mg | ORAL_TABLET | Freq: Every day | ORAL | Status: DC
  Administered 2018-11-20 – 2018-11-23 (×4): 10 mg via ORAL
  Filled 2018-11-20 (×4): qty 1

## 2018-11-20 MED ORDER — SODIUM CHLORIDE 0.9 % IV SOLN
INTRAVENOUS | Status: DC
  Administered 2018-11-20 – 2018-11-21 (×2): via INTRAVENOUS

## 2018-11-20 MED ORDER — LEVALBUTEROL HCL 0.63 MG/3ML IN NEBU: 0.6300 mg | INHALATION_SOLUTION | Freq: Four times a day (QID) | RESPIRATORY_TRACT | Status: DC | PRN

## 2018-11-20 MED ORDER — OXYCODONE-ACETAMINOPHEN 10-325 MG PO TABS: 1.0000 | ORAL_TABLET | Freq: Three times a day (TID) | ORAL | Status: DC | PRN

## 2018-11-20 MED ORDER — APIXABAN 2.5 MG PO TABS
2.5000 mg | ORAL_TABLET | Freq: Two times a day (BID) | ORAL | Status: DC
  Administered 2018-11-20 – 2018-11-22 (×4): 2.5 mg via ORAL
  Filled 2018-11-20 (×4): qty 1

## 2018-11-20 MED ORDER — MUPIROCIN 2 % EX OINT
1.0000 "application " | TOPICAL_OINTMENT | Freq: Two times a day (BID) | CUTANEOUS | Status: DC
  Administered 2018-11-20 – 2018-11-24 (×9): 1 via NASAL
  Filled 2018-11-20: qty 22

## 2018-11-20 MED ORDER — CALCIUM-VITAMIN D 500-200 MG-UNIT PO TABS
1.0000 | ORAL_TABLET | Freq: Every morning | ORAL | Status: DC
  Administered 2018-11-20 – 2018-11-23 (×4): 1 via ORAL
  Filled 2018-11-20 (×7): qty 1

## 2018-11-20 MED ORDER — OXYCODONE-ACETAMINOPHEN 5-325 MG PO TABS
1.0000 | ORAL_TABLET | Freq: Three times a day (TID) | ORAL | Status: DC | PRN
  Administered 2018-11-20 – 2018-11-23 (×6): 1 via ORAL
  Filled 2018-11-20 (×6): qty 1

## 2018-11-20 MED ORDER — SODIUM CHLORIDE 0.9 % IV SOLN
2.0000 g | INTRAVENOUS | Status: DC
  Filled 2018-11-20: qty 20

## 2018-11-20 MED ORDER — SODIUM CHLORIDE 0.9 % IV SOLN
1.0000 g | Freq: Two times a day (BID) | INTRAVENOUS | Status: DC
  Administered 2018-11-21: 1 g via INTRAVENOUS
  Filled 2018-11-20 (×8): qty 1

## 2018-11-20 MED ORDER — APIXABAN 5 MG PO TABS
5.0000 mg | ORAL_TABLET | Freq: Two times a day (BID) | ORAL | Status: DC
  Administered 2018-11-20: 5 mg via ORAL
  Filled 2018-11-20: qty 1

## 2018-11-20 NOTE — Progress Notes (Signed)
PROGRESS NOTE    [name] Walker  VHQ:[id number] DOB: 10-30-1924 DOA: 11/19/2018 PCP: Claretta Fraise, MD    Brief Narrative:  85 year old female with a history of COPD, history of DVT, ischemic colitis, presents to the emergency room with urinary incontinence, generalized pain.  She was found to be hypotensive with concerns for underlying sepsis.  It was felt that she had sepsis from urinary source.  She is found to have blood cultures positive for gram-negative rods.  She is on IV antibiotics.   Assessment & Plan:   Principal Problem:   Sepsis due to urinary tract infection ([place]) Active Problems:   Depression, recurrent (HCC)   Essential hypertension   COPD (chronic obstructive pulmonary disease) (HCC)   GERD   Atrial fibrillation with RVR (HCC)   Bacteremia due to Gram-negative bacteria   AKI (acute kidney injury) ([place])   1. Sepsis secondary to urinary tract infection.  She has positive blood cultures for gram-negative rods.  Continue on meropenem.  Source is likely urine.  Follow-up culture results.  Continue IV hydration. 2. A. fib with RVR.  She is anticoagulated with Eliquis.  Echocardiogram showed ejection fraction 45 to 50%.  She takes verapamil as an outpatient which is currently held due to hypotension.  Heart rate is currently stable.  Continue to monitor. 3. Hypertension.  Hold verapamil for now while she is recovering from sepsis. 4. COPD.  No shortness of breath.  Continue bronchodilators as needed. 5. GERD.  Continue on PPI 6. Acute kidney injury.  Likely related to sepsis.  Continue IV hydration and follow urine output.   DVT prophylaxis: Apixaban Code Status: DNR Family Communication: No family present Disposition Plan: Continue to monitor in the stepdown unit   Consultants:     Procedures:  Echocardiogram:1. The left ventricle has mildly reduced systolic function, with an ejection fraction of 45-50% in the setting of atrial fibrillation. Left  ventricular diastolic Doppler parameters are indeterminate.  2. Right ventricular contraction is mildly reduced. Normal estimated RVSP of 27.7 mmHg.  3. Left atrial size was moderately dilated.  4. Right atrial size was moderately dilated.  5. The aortic valve is tricuspid. Mild calcification of the aortic valve. Mild to moderate aortic annular calcification noted. Possible mild to moderate aortic regurgitation based on limited interrogation.  6. The mitral valve is normal in structure. There is moderate mitral annular calcification present. Mitral valve regurgitation is mild to moderate by color flow Doppler.  7. The tricuspid valve is normal in structure.   8. The inferior vena cava was dilated in size with >50% respiratory variability.  Antimicrobials:   Meropenem 2/27>     Subjective: Patient is somnolent, responds to voice, but does not answer any questions.  Objective: Vitals:   11/20/18 1300 11/20/18 1321 11/20/18 1400 11/20/18 1500  BP: 110/65  (!) 114/96   Pulse: 71  (!) 154 (!) 116  Resp:      Temp:      TempSrc:      SpO2: 96% 96% (!) 88% 94%  Weight:      Height:        Intake/Output Summary (Last 24 hours) at 11/20/2018 1916 Last data filed at 11/20/2018 1500 Gross per 24 hour  Intake 1480.6 ml  Output -  Net 1480.6 ml   Filed Weights   11/19/18 1215 11/20/18 0336  Weight: 81.6 kg 81 kg    Examination:  General exam: Appears calm and comfortable  Respiratory system: Clear to auscultation. Respiratory effort  normal. Cardiovascular system: S1 & S2 heard, RRR. No JVD, murmurs, rubs, gallops or clicks. No pedal edema. Gastrointestinal system: Abdomen is nondistended, soft and nontender. No organomegaly or masses felt. Normal bowel sounds heard. Central nervous system: No focal neurological deficits. Extremities: Symmetric 5 x 5 power. Skin: No rashes, lesions or ulcers Psychiatry: Somnolent    Data Reviewed: I have personally reviewed following labs  and imaging studies  CBC: Recent Labs  Lab 11/19/18 1235 11/20/18 0432  WBC 14.9* 21.2*  NEUTROABS 12.8* 17.4*  HGB 10.7* 10.7*  HCT 34.8* 34.9*  MCV 99.7 101.7*  PLT 223 700   Basic Metabolic Panel: Recent Labs  Lab 11/19/18 1235 11/20/18 0432  NA 137 138  K 3.8 4.2  CL 103 105  CO2 24 23  GLUCOSE 101* 164*  BUN 15 20  CREATININE 1.14* 1.53*  CALCIUM 8.5* 8.3*  MG 1.8  --    GFR: Estimated Creatinine Clearance: 21 mL/min (A) (by C-G formula based on SCr of 1.53 mg/dL (H)). Liver Function Tests: Recent Labs  Lab 11/19/18 1235  AST 21  ALT 11  ALKPHOS 123  BILITOT 0.8  PROT 6.2*  ALBUMIN 3.1*   No results for input(s): LIPASE, AMYLASE in the last 168 hours. No results for input(s): AMMONIA in the last 168 hours. Coagulation Profile: No results for input(s): INR, PROTIME in the last 168 hours. Cardiac Enzymes: No results for input(s): CKTOTAL, CKMB, CKMBINDEX, TROPONINI in the last 168 hours. BNP (last 3 results) No results for input(s): PROBNP in the last 8760 hours. HbA1C: No results for input(s): HGBA1C in the last 72 hours. CBG: No results for input(s): GLUCAP in the last 168 hours. Lipid Profile: No results for input(s): CHOL, HDL, LDLCALC, TRIG, CHOLHDL, LDLDIRECT in the last 72 hours. Thyroid Function Tests: Recent Labs    11/19/18 1252  TSH 1.196   Anemia Panel: No results for input(s): VITAMINB12, FOLATE, FERRITIN, TIBC, IRON, RETICCTPCT in the last 72 hours. Sepsis Labs: Recent Labs  Lab 11/19/18 1235 11/19/18 1447  LATICACIDVEN 1.6 1.3    Recent Results (from the past 240 hour(s))  Blood Culture (routine x 2)     Status: None (Preliminary result)   Collection Time: 11/19/18 12:52 PM  Result Value Ref Range Status   Specimen Description   Final    BLOOD LEFT WRIST Performed at [facility], [address] [place], [place] [address]    Special Requests   Final    BOTTLES DRAWN AEROBIC AND ANAEROBIC Blood Culture adequate  volume Performed at [facility], [address] [place], [place] [address]    Culture  Setup Time   Final    IN BOTH AEROBIC AND ANAEROBIC BOTTLES GRAM NEGATIVE RODS Gram Stain Report Called to,Read Back By and Verified With: J HEARNS,RN @0501  11/20/18 MKELLY Organism ID to follow CRITICAL RESULT CALLED TO, READ BACK BY AND VERIFIED WITH: Francee Nodal [id number] MLM Performed at [facility], [place] [address] [place], [place] [address]    Culture GRAM NEGATIVE RODS  Final   Report Status PENDING  Incomplete  Blood Culture ID Panel (Reflexed)     Status: Abnormal   Collection Time: 11/19/18 12:52 PM  Result Value Ref Range Status   Enterococcus species NOT DETECTED NOT DETECTED Final   Listeria monocytogenes NOT DETECTED NOT DETECTED Final   Staphylococcus species NOT DETECTED NOT DETECTED Final   Staphylococcus aureus (BCID) NOT DETECTED NOT DETECTED Final   Streptococcus species NOT DETECTED NOT  DETECTED Final   Streptococcus agalactiae NOT DETECTED NOT DETECTED Final   Streptococcus pneumoniae NOT DETECTED NOT DETECTED Final   Streptococcus pyogenes NOT DETECTED NOT DETECTED Final   Acinetobacter baumannii NOT DETECTED NOT DETECTED Final   Enterobacteriaceae species DETECTED (A) NOT DETECTED Final    Comment: Enterobacteriaceae represent a large family of gram-negative bacteria, not a single organism. CRITICAL RESULT CALLED TO, READ BACK BY AND VERIFIED WITH: PHARMD T MEYER [id number] MLM    Enterobacter cloacae complex NOT DETECTED NOT DETECTED Final   Escherichia coli DETECTED (A) NOT DETECTED Final    Comment: CRITICAL RESULT CALLED TO, READ BACK BY AND VERIFIED WITH: PHARMD T MEYER [id number] MLM    Klebsiella oxytoca NOT DETECTED NOT DETECTED Final   Klebsiella pneumoniae NOT DETECTED NOT DETECTED Final   Proteus species NOT DETECTED NOT DETECTED Final   Serratia marcescens NOT DETECTED NOT DETECTED Final   Carbapenem resistance NOT DETECTED NOT DETECTED Final    Haemophilus influenzae NOT DETECTED NOT DETECTED Final   Neisseria meningitidis NOT DETECTED NOT DETECTED Final   Pseudomonas aeruginosa NOT DETECTED NOT DETECTED Final   Candida albicans NOT DETECTED NOT DETECTED Final   Candida glabrata NOT DETECTED NOT DETECTED Final   Candida krusei NOT DETECTED NOT DETECTED Final   Candida parapsilosis NOT DETECTED NOT DETECTED Final   Candida tropicalis NOT DETECTED NOT DETECTED Final    Comment: Performed at [facility], [place] [address], [place], [place] [address]  Blood Culture (routine x 2)     Status: None (Preliminary result)   Collection Time: 11/19/18 12:53 PM  Result Value Ref Range Status   Specimen Description   Final    BLOOD RIGHT WRIST DRAWN BY RN Performed at [facility], [address], [place], [place] [address]    Special Requests   Final    BOTTLES DRAWN AEROBIC AND ANAEROBIC Blood Culture adequate volume Performed at [facility], [address] [place], [place] [address]    Culture  Setup Time   Final    ANAEROBIC BOTTLE ONLY GRAM NEGATIVE RODS Gram Stain Report Called to,Read Back By and Verified With: J HEARN,RN @0501  11/20/18 [facility] Performed at [facility], [address] [place], [place] [address]    Culture GRAM NEGATIVE RODS  Final   Report Status PENDING  Incomplete  MRSA PCR Screening     Status: Abnormal   Collection Time: 11/20/18  3:19 AM  Result Value Ref Range Status   MRSA by PCR POSITIVE (A) NEGATIVE Final    Comment:        The GeneXpert MRSA Assay (FDA approved for NASAL specimens only), is one component of a comprehensive MRSA colonization surveillance program. It is not intended to diagnose MRSA infection nor to guide or monitor treatment for MRSA infections. RESULT CALLED TO, READ BACK BY AND VERIFIED WITH: WILKINSON,R@0735  BY MATTHEWS, B 2.27.2020 Performed at [facility], [address], [place], [place] [address]          Radiology Studies: Dg Chest 2 View  Result Date:  11/19/2018 CLINICAL DATA:  Fever. EXAM: CHEST - 2 VIEW COMPARISON:  PA and lateral chest 03/24/2018. FINDINGS: No consolidative process, pneumothorax or effusion. Heart size is enlarged. Aortic atherosclerosis noted. Advanced degenerative change about the shoulders. IMPRESSION: No acute disease. Atherosclerosis. Electronically Signed   By: Inge Rise M.D.   On: 11/19/2018 14:10   Ct Head Wo Contrast  Result Date: 11/19/2018 CLINICAL DATA:  Altered level of consciousness EXAM:  CT HEAD WITHOUT CONTRAST TECHNIQUE: Contiguous axial images were obtained from the base of the skull through the vertex without intravenous contrast. COMPARISON:  05/23/2016 FINDINGS: Brain: Remote lacunar infarcts in the right thalamus and right lentiform nucleus, no change from 05/23/2016. Periventricular white matter and corona radiata hypodensities favor chronic ischemic microvascular white matter disease. Otherwise, the brainstem, cerebellum, cerebral peduncles, thalamus, basal ganglia, basilar cisterns, and ventricular system appear within normal limits. No intracranial hemorrhage, mass lesion, or acute CVA. Vascular: There is atherosclerotic calcification of the cavernous carotid arteries bilaterally. Skull: Unremarkable Sinuses/Orbits: Mild chronic ethmoid sinusitis. Bilateral chronic superior ophthalmic vein dilatation. Other: No supplemental non-categorized findings. IMPRESSION: 1. Remote lacunar infarcts in the right thalamus and right lentiform nucleus, not changed from 2017. 2. Chronic ethmoid sinusitis. 3. Bilateral chronic superior ophthalmic vein dilatation, nonspecific. This can be associated with ophthalmic vein varices, raised intracranial pressure, or Graves disease. No thickening of the extraocular muscles observed. 4. No acute findings. Electronically Signed   By: Van Clines M.D.   On: 11/19/2018 14:37   Mr Lumbar Spine W Wo Contrast  Result Date: 11/19/2018 CLINICAL DATA:  Increased pain and  decreased mobility EXAM: MRI LUMBAR SPINE WITHOUT AND WITH CONTRAST TECHNIQUE: Multiplanar and multiecho pulse sequences of the lumbar spine were obtained without and with intravenous contrast. CONTRAST:  7.5 mL Gadavist COMPARISON:  PET CT 06/09/2015 Lumbar spine CT 05/30/2013 FINDINGS: Segmentation: Normal. The lowest disc space is considered to be L5-S1. Alignment:  Mild sigmoid scoliosis of lumbar spine. Vertebrae: No acute compression fracture, discitis-osteomyelitis of focal marrow lesion. Conus medullaris and cauda equina: The conus medullaris terminates at the L1 level. The cauda equina and conus medullaris are both normal. Paraspinal and other soft tissues: The visualized retroperitoneal organs and paraspinal soft tissues are normal. Disc levels: Sagittal plane imaging includes the T10-11 disc level through the upper sacrum, with axial imaging of the T12-L1 to L5-S1 disc levels. T10-11: Disc space narrowing without spinal canal stenosis. T11-12: Disc space narrowing without spinal canal stenosis. T12-L1: Disc space narrowing with diffuse bulge and mild spinal canal stenosis. Mild right and moderate left neural foraminal stenosis. L1-2: Disc space narrowing and mild bulge. Mild spinal canal stenosis. Moderate left and mild right neural foraminal stenosis. L2-3: Disc space narrowing with left eccentric bulge. Mild spinal canal stenosis, greatest in the left lateral recess. Mild right and moderate left neural foraminal stenosis. L3-4: Diffuse disc bulge with moderate spinal canal stenosis. Mild facet hypertrophy. Mild bilateral neural foraminal stenosis. L4-5: Disc space narrowing with mild disc bulge. No spinal canal stenosis. Moderate right foraminal stenosis. L5-S1: Disc desiccation and height loss with mild bulge. No spinal canal stenosis. Mild left foraminal stenosis. The visualized portion of the sacrum is normal. IMPRESSION: 1. Diffuse degenerative disc disease with mild-to-moderate spinal canal  stenosis throughout most of the lumbar spine. 2. Multilevel mild-to-moderate neural foraminal stenosis. 3. No acute abnormality of the lumbar spine. Electronically Signed   By: Ulyses Jarred M.D.   On: 11/19/2018 16:48        Scheduled Meds: . amitriptyline  75 mg Oral QHS  . apixaban  2.5 mg Oral BID  . calcium-vitamin D  1 tablet Oral q morning - 10a  . Chlorhexidine Gluconate Cloth  6 each Topical Q0600  . donepezil  10 mg Oral QHS  . gabapentin  300 mg Oral BID  . ipratropium  0.5 mg Nebulization Q6H  . levalbuterol  0.63 mg Nebulization Q6H  . loratadine  10 mg Oral  QHS  . mupirocin ointment  1 application Nasal BID  . nystatin   Topical TID  . pantoprazole  40 mg Oral Daily  . rOPINIRole  1-2 mg Oral QHS   Continuous Infusions: . sodium chloride 100 mL/hr at 11/20/18 1006  . meropenem (MERREM) IV       LOS: 1 day    Time spent: 21mins    Kathie Dike, MD [facility]   If 7PM-7AM, please contact night-coverage [email or web address]  11/20/2018, 7:16 PM

## 2018-11-20 NOTE — ED Notes (Signed)
Patient given snacks and oral fluids.

## 2018-11-20 NOTE — Progress Notes (Signed)
*  PRELIMINARY RESULTS* Echocardiogram 2D Echocardiogram has been performed.  Leavy Cella 11/20/2018, 2:41 PM

## 2018-11-20 NOTE — Progress Notes (Signed)
PHARMACY - PHYSICIAN COMMUNICATION CRITICAL VALUE ALERT - BLOOD CULTURE IDENTIFICATION (BCID)  [name] is an 85 y.o. female who presented to [facility] on 11/19/2018 with a chief complaint of UTI.  Assessment:  Blood cultures: 3 of 4 bottles growing E. Coli (no KPC detected)  Name of physician (or Provider) Contacted:  Dr. Roderic [place]  Current antibiotics: ceftriaxone 1g IV q24h  Changes to prescribed antibiotics recommended:  Patient is on recommended antibiotics - may need to increase dose to 2g  No results found for this or any previous visit.  Despina Pole 11/20/2018  8:34 AM

## 2018-11-21 ENCOUNTER — Encounter (HOSPITAL_COMMUNITY): Payer: Self-pay | Admitting: Radiology

## 2018-11-21 ENCOUNTER — Inpatient Hospital Stay (HOSPITAL_COMMUNITY): Payer: Medicare Other

## 2018-11-21 LAB — CBC WITH DIFFERENTIAL/PLATELET
Abs Immature Granulocytes: 0.2 10*3/uL — ABNORMAL HIGH (ref 0.00–0.07)
Basophils Absolute: 0 10*3/uL (ref 0.0–0.1)
Basophils Relative: 0 %
Eosinophils Absolute: 0 10*3/uL (ref 0.0–0.5)
Eosinophils Relative: 0 %
HCT: 34 % — ABNORMAL LOW (ref 36.0–46.0)
HEMOGLOBIN: 10.5 g/dL — AB (ref 12.0–15.0)
IMMATURE GRANULOCYTES: 1 %
LYMPHS ABS: 1.1 10*3/uL (ref 0.7–4.0)
Lymphocytes Relative: 6 %
MCH: 31.3 pg (ref 26.0–34.0)
MCHC: 30.9 g/dL (ref 30.0–36.0)
MCV: 101.5 fL — AB (ref 80.0–100.0)
Monocytes Absolute: 2.1 10*3/uL — ABNORMAL HIGH (ref 0.1–1.0)
Monocytes Relative: 11 %
Neutro Abs: 14.8 10*3/uL — ABNORMAL HIGH (ref 1.7–7.7)
Neutrophils Relative %: 82 %
Platelets: 213 10*3/uL (ref 150–400)
RBC: 3.35 MIL/uL — ABNORMAL LOW (ref 3.87–5.11)
RDW: 13.6 % (ref 11.5–15.5)
WBC Morphology: INCREASED
WBC: 18.2 10*3/uL — ABNORMAL HIGH (ref 4.0–10.5)
nRBC: 0 % (ref 0.0–0.2)

## 2018-11-21 LAB — BASIC METABOLIC PANEL
Anion gap: 7 (ref 5–15)
BUN: 29 mg/dL — ABNORMAL HIGH (ref 8–23)
CHLORIDE: 107 mmol/L (ref 98–111)
CO2: 24 mmol/L (ref 22–32)
Calcium: 8.3 mg/dL — ABNORMAL LOW (ref 8.9–10.3)
Creatinine, Ser: 1.38 mg/dL — ABNORMAL HIGH (ref 0.44–1.00)
GFR calc Af Amer: 39 mL/min — ABNORMAL LOW (ref >60)
GFR calc non Af Amer: 33 mL/min — ABNORMAL LOW (ref >60)
GLUCOSE: 123 mg/dL — AB (ref 70–99)
Potassium: 4.2 mmol/L (ref 3.5–5.1)
Sodium: 138 mmol/L (ref 135–145)

## 2018-11-21 MED ORDER — SODIUM CHLORIDE 0.9% FLUSH
3.0000 mL | Freq: Two times a day (BID) | INTRAVENOUS | Status: DC
  Administered 2018-11-21 – 2018-11-24 (×6): 3 mL via INTRAVENOUS

## 2018-11-21 MED ORDER — CARVEDILOL 3.125 MG PO TABS
3.1250 mg | ORAL_TABLET | Freq: Two times a day (BID) | ORAL | Status: DC
  Administered 2018-11-21 – 2018-11-24 (×6): 3.125 mg via ORAL
  Filled 2018-11-21 (×6): qty 1

## 2018-11-21 MED ORDER — VERAPAMIL HCL ER 180 MG PO TBCR
180.0000 mg | EXTENDED_RELEASE_TABLET | Freq: Every day | ORAL | Status: DC
  Filled 2018-11-21 (×3): qty 1

## 2018-11-21 MED ORDER — CARVEDILOL 3.125 MG PO TABS: 3.1250 mg | ORAL_TABLET | Freq: Two times a day (BID) | ORAL | Status: DC

## 2018-11-21 MED ORDER — SODIUM CHLORIDE 0.9 % IV SOLN
500.0000 mg | Freq: Two times a day (BID) | INTRAVENOUS | Status: DC
  Administered 2018-11-21 – 2018-11-22 (×2): 500 mg via INTRAVENOUS
  Filled 2018-11-21 (×6): qty 0.5

## 2018-11-21 MED ORDER — GUAIFENESIN ER 600 MG PO TB12
1200.0000 mg | ORAL_TABLET | Freq: Two times a day (BID) | ORAL | Status: DC
  Administered 2018-11-21 – 2018-11-24 (×6): 1200 mg via ORAL
  Filled 2018-11-21 (×6): qty 2

## 2018-11-21 MED ORDER — DEXTROMETHORPHAN POLISTIREX ER 30 MG/5ML PO SUER
30.0000 mg | Freq: Two times a day (BID) | ORAL | Status: DC
  Administered 2018-11-21 – 2018-11-24 (×6): 30 mg via ORAL
  Filled 2018-11-21 (×10): qty 5

## 2018-11-21 NOTE — Progress Notes (Signed)
PROGRESS NOTE    [name] Walker  QQV:[id number] DOB: [birth year] DOA: 11/19/2018 PCP: Claretta Fraise, MD    Brief Narrative:  85 year old female with a history of COPD, history of DVT, ischemic colitis, presents to the emergency room with urinary incontinence, generalized pain.  She was found to be hypotensive with concerns for underlying sepsis.  It was felt that she had sepsis from urinary source.  She is found to have blood cultures positive for gram-negative rods.  She is on IV antibiotics.   Assessment & Plan:   Principal Problem:   Sepsis due to urinary tract infection ([place]) Active Problems:   Depression, recurrent (HCC)   Essential hypertension   COPD (chronic obstructive pulmonary disease) (HCC)   GERD   Atrial fibrillation with RVR (HCC)   Bacteremia due to Gram-negative bacteria   AKI (acute kidney injury) ([place])   1. Sepsis secondary to urinary tract infection.  She has positive blood cultures for E. coli.  Continue on meropenem.  Source is likely urine.  Follow-up culture results.  Hemodynamics are stabilizing 2. A. fib with RVR.  She is anticoagulated with Eliquis.  Echocardiogram showed ejection fraction 45 to 50%.  She takes verapamil as an outpatient which was initially held due to hypotension.  Since her EF is on the lower side.  Since blood pressure has improved, will discontinue calcium channel blockers and start on Coreg. 3. Hypertension.  Blood pressures improved with IV hydration.  Started on Coreg.. 4. COPD.  No shortness of breath.  Continue bronchodilators as needed. 5. GERD.  Continue on PPI 6. Acute kidney injury.  Likely related to sepsis.  Improving with hydration.  Will discontinue IV fluids since she has become edematous. 7. Cough.  Patient complains of persistent cough.  Check chest x-ray.   DVT prophylaxis: Apixaban Code Status: DNR Family Communication: No family present Disposition Plan: Transfer to telemetry   Consultants:      Procedures:  Echocardiogram:1. The left ventricle has mildly reduced systolic function, with an ejection fraction of 45-50% in the setting of atrial fibrillation. Left ventricular diastolic Doppler parameters are indeterminate.  2. Right ventricular contraction is mildly reduced. Normal estimated RVSP of 27.7 mmHg.  3. Left atrial size was moderately dilated.  4. Right atrial size was moderately dilated.  5. The aortic valve is tricuspid. Mild calcification of the aortic valve. Mild to moderate aortic annular calcification noted. Possible mild to moderate aortic regurgitation based on limited interrogation.  6. The mitral valve is normal in structure. There is moderate mitral annular calcification present. Mitral valve regurgitation is mild to moderate by color flow Doppler.  7. The tricuspid valve is normal in structure.   8. The inferior vena cava was dilated in size with >50% respiratory variability.  Antimicrobials:   Meropenem 2/27>     Subjective: She reports that she is consistently coughing.  She has some abdominal soreness related to coughing.  Objective: Vitals:   11/21/18 1700 11/21/18 1716 11/21/18 1800 11/21/18 1802  BP:  106/89  114/74  Pulse: 84 87 (!) 139 92  Resp:  15 17 (!) 21  Temp:      TempSrc:      SpO2: 97% 95% 97% 96%  Weight:      Height:        Intake/Output Summary (Last 24 hours) at 11/21/2018 2029 Last data filed at 11/21/2018 1800 Gross per 24 hour  Intake 1870.06 ml  Output 300 ml  Net 1570.06 ml   [employer]  11/19/18 1215 11/20/18 0336 11/21/18 0500  Weight: 81.6 kg 81 kg 81.2 kg    Examination:  General exam: Alert, awake, no distress Respiratory system: Clear to auscultation. Respiratory effort normal. Cardiovascular system: Irregular. No murmurs, rubs, gallops. Gastrointestinal system: Abdomen is nondistended, soft and nontender. No organomegaly or masses felt. Normal bowel sounds heard. Central nervous system:  No  focal neurological deficits. Extremities: 1+ edema Skin: No rashes, lesions or ulcers Psychiatry: Confused     Data Reviewed: I have personally reviewed following labs and imaging studies  CBC: Recent Labs  Lab 11/19/18 1235 11/20/18 0432 11/21/18 0458  WBC 14.9* 21.2* 18.2*  NEUTROABS 12.8* 17.4* 14.8*  HGB 10.7* 10.7* 10.5*  HCT 34.8* 34.9* 34.0*  MCV 99.7 101.7* 101.5*  PLT 223 208 518   Basic Metabolic Panel: Recent Labs  Lab 11/19/18 1235 11/20/18 0432 11/21/18 0458  NA 137 138 138  K 3.8 4.2 4.2  CL 103 105 107  CO2 24 23 24   GLUCOSE 101* 164* 123*  BUN 15 20 29*  CREATININE 1.14* 1.53* 1.38*  CALCIUM 8.5* 8.3* 8.3*  MG 1.8  --   --    GFR: Estimated Creatinine Clearance: 23.3 mL/min (A) (by C-G formula based on SCr of 1.38 mg/dL (H)). Liver Function Tests: Recent Labs  Lab 11/19/18 1235  AST 21  ALT 11  ALKPHOS 123  BILITOT 0.8  PROT 6.2*  ALBUMIN 3.1*   No results for input(s): LIPASE, AMYLASE in the last 168 hours. No results for input(s): AMMONIA in the last 168 hours. Coagulation Profile: No results for input(s): INR, PROTIME in the last 168 hours. Cardiac Enzymes: No results for input(s): CKTOTAL, CKMB, CKMBINDEX, TROPONINI in the last 168 hours. BNP (last 3 results) No results for input(s): PROBNP in the last 8760 hours. HbA1C: No results for input(s): HGBA1C in the last 72 hours. CBG: No results for input(s): GLUCAP in the last 168 hours. Lipid Profile: No results for input(s): CHOL, HDL, LDLCALC, TRIG, CHOLHDL, LDLDIRECT in the last 72 hours. Thyroid Function Tests: Recent Labs    11/19/18 1252  TSH 1.196   Anemia Panel: No results for input(s): VITAMINB12, FOLATE, FERRITIN, TIBC, IRON, RETICCTPCT in the last 72 hours. Sepsis Labs: Recent Labs  Lab 11/19/18 1235 11/19/18 1447  LATICACIDVEN 1.6 1.3    Recent Results (from the past 240 hour(s))  Blood Culture (routine x 2)     Status: Abnormal (Preliminary result)    Collection Time: 11/19/18 12:52 PM  Result Value Ref Range Status   Specimen Description BLOOD LEFT WRIST  Final   Special Requests   Final    BOTTLES DRAWN AEROBIC AND ANAEROBIC Blood Culture adequate volume Performed at [facility], [address] [place], [place] [address]    Culture  Setup Time   Final    IN BOTH AEROBIC AND ANAEROBIC BOTTLES GRAM NEGATIVE RODS Gram Stain Report Called to,Read Back By and Verified With: J HEARNS,RN @0501  11/20/18 [facility] CRITICAL RESULT CALLED TO, READ BACK BY AND VERIFIED WITH: PHARMD T MEYER [id number] MLM    Culture ESCHERICHIA COLI SUSCEPTIBILITIES TO FOLLOW  (A)  Final   Report Status PENDING  Incomplete  Blood Culture ID Panel (Reflexed)     Status: Abnormal   Collection Time: 11/19/18 12:52 PM  Result Value Ref Range Status   Enterococcus species NOT DETECTED NOT DETECTED Final   Listeria monocytogenes NOT DETECTED NOT DETECTED Final   Staphylococcus species NOT DETECTED NOT DETECTED Final   Staphylococcus aureus (  BCID) NOT DETECTED NOT DETECTED Final   Streptococcus species NOT DETECTED NOT DETECTED Final   Streptococcus agalactiae NOT DETECTED NOT DETECTED Final   Streptococcus pneumoniae NOT DETECTED NOT DETECTED Final   Streptococcus pyogenes NOT DETECTED NOT DETECTED Final   Acinetobacter baumannii NOT DETECTED NOT DETECTED Final   Enterobacteriaceae species DETECTED (A) NOT DETECTED Final    Comment: Enterobacteriaceae represent a large family of gram-negative bacteria, not a single organism. CRITICAL RESULT CALLED TO, READ BACK BY AND VERIFIED WITH: PHARMD T MEYER [id number] MLM    Enterobacter cloacae complex NOT DETECTED NOT DETECTED Final   Escherichia coli DETECTED (A) NOT DETECTED Final    Comment: CRITICAL RESULT CALLED TO, READ BACK BY AND VERIFIED WITH: PHARMD T MEYER [id number] MLM    Klebsiella oxytoca NOT DETECTED NOT DETECTED Final   Klebsiella pneumoniae NOT DETECTED NOT DETECTED Final   Proteus species NOT  DETECTED NOT DETECTED Final   Serratia marcescens NOT DETECTED NOT DETECTED Final   Carbapenem resistance NOT DETECTED NOT DETECTED Final   Haemophilus influenzae NOT DETECTED NOT DETECTED Final   Neisseria meningitidis NOT DETECTED NOT DETECTED Final   Pseudomonas aeruginosa NOT DETECTED NOT DETECTED Final   Candida albicans NOT DETECTED NOT DETECTED Final   Candida glabrata NOT DETECTED NOT DETECTED Final   Candida krusei NOT DETECTED NOT DETECTED Final   Candida parapsilosis NOT DETECTED NOT DETECTED Final   Candida tropicalis NOT DETECTED NOT DETECTED Final    Comment: Performed at [facility], [place] [address] [place], [place] [address]  Blood Culture (routine x 2)     Status: None (Preliminary result)   Collection Time: 11/19/18 12:53 PM  Result Value Ref Range Status   Specimen Description   Final    BLOOD RIGHT WRIST DRAWN BY RN Performed at [facility], [address] [place], [place] [address]    Special Requests   Final    BOTTLES DRAWN AEROBIC AND ANAEROBIC Blood Culture adequate volume Performed at [facility], [address], [place], [place] [address]    Culture  Setup Time   Final    ANAEROBIC BOTTLE ONLY GRAM NEGATIVE RODS Gram Stain Report Called to,Read Back By and Verified With: J HEARN,RN @0501  11/20/18 [facility] Performed at [facility], [address] [place], [place] [address]    Culture GRAM NEGATIVE RODS  Final   Report Status PENDING  Incomplete  Urine culture     Status: Abnormal (Preliminary result)   Collection Time: 11/19/18  7:19 PM  Result Value Ref Range Status   Specimen Description   Final    URINE, RANDOM Performed at [facility], [address], [place], [place] [address]    Special Requests   Final    NONE Performed at [facility], [address], [place], [place] [address]    Culture (A)  Final    >=100,000 COLONIES/mL ESCHERICHIA COLI SUSCEPTIBILITIES TO FOLLOW Performed at [facility], [place] [address], [place],  [place] [address]    Report Status PENDING  Incomplete  MRSA PCR Screening     Status: Abnormal   Collection Time: 11/20/18  3:19 AM  Result Value Ref Range Status   MRSA by PCR POSITIVE (A) NEGATIVE Final    Comment:        The GeneXpert MRSA Assay (FDA approved for NASAL specimens only), is one component of a comprehensive MRSA colonization surveillance program. It is not intended to diagnose MRSA infection nor to guide or  monitor treatment for MRSA infections. RESULT CALLED TO, READ BACK BY AND VERIFIED WITH: WILKINSON,R@0735  BY MATTHEWS, B 2.27.2020 Performed at [facility], [address], [place], [place]          Radiology Studies: Dg Chest [facility] 1 View  Result Date: 11/21/2018 CLINICAL DATA:  85 year old with cough. EXAM: PORTABLE CHEST 1 VIEW COMPARISON:  11/19/2018 FINDINGS: Coarse lung markings appear to be chronic. Low lung volumes without focal disease. Heart size is upper limits of normal and stable. Trachea is midline. Degenerative changes at the shoulder joints. IMPRESSION: Low lung volumes without acute findings. Electronically Signed   By: 12/02/2018 M.D.   On: 11/21/2018 17:49        Scheduled Meds: . amitriptyline  75 mg Oral QHS  . apixaban  2.5 mg Oral BID  . calcium-vitamin D  1 tablet Oral q morning - 10a  . Chlorhexidine Gluconate Cloth  6 each Topical Q0600  . dextromethorphan  30 mg Oral BID  . donepezil  10 mg Oral QHS  . gabapentin  300 mg Oral BID  . guaiFENesin  1,200 mg Oral BID  . ipratropium  0.5 mg Nebulization Q6H  . levalbuterol  0.63 mg Nebulization Q6H  . loratadine  10 mg Oral QHS  . mupirocin ointment  1 application Nasal BID  . nystatin   Topical TID  . pantoprazole  40 mg Oral Daily  . rOPINIRole  1-2 mg Oral QHS  . verapamil  180 mg Oral QHS   Continuous Infusions: . meropenem (MERREM) IV Stopped (11/21/18 1543)     LOS: 2 days    Time spent: 38mins    25m, MD [facility]   If 7PM-7AM,  please contact night-coverage [email or web address]  11/21/2018, 8:29 PM

## 2018-11-22 DIAGNOSIS — L8990 Pressure ulcer of unspecified site, unspecified stage: Secondary | ICD-10-CM

## 2018-11-22 LAB — BASIC METABOLIC PANEL
Anion gap: 5 (ref 5–15)
BUN: 23 mg/dL (ref 8–23)
CALCIUM: 8.3 mg/dL — AB (ref 8.9–10.3)
CO2: 23 mmol/L (ref 22–32)
Chloride: 108 mmol/L (ref 98–111)
Creatinine, Ser: 1.07 mg/dL — ABNORMAL HIGH (ref 0.44–1.00)
GFR calc non Af Amer: 45 mL/min — ABNORMAL LOW (ref >60)
GFR, EST AFRICAN AMERICAN: 53 mL/min — AB (ref >60)
Glucose, Bld: 83 mg/dL (ref 70–99)
Potassium: 4.1 mmol/L (ref 3.5–5.1)
Sodium: 136 mmol/L (ref 135–145)

## 2018-11-22 LAB — CBC WITH DIFFERENTIAL/PLATELET
Abs Immature Granulocytes: 0.05 10*3/uL (ref 0.00–0.07)
Basophils Absolute: 0 10*3/uL (ref 0.0–0.1)
Basophils Relative: 0 %
Eosinophils Absolute: 0.4 10*3/uL (ref 0.0–0.5)
Eosinophils Relative: 4 %
HCT: 34.5 % — ABNORMAL LOW (ref 36.0–46.0)
Hemoglobin: 10.3 g/dL — ABNORMAL LOW (ref 12.0–15.0)
IMMATURE GRANULOCYTES: 0 %
Lymphocytes Relative: 12 %
Lymphs Abs: 1.5 10*3/uL (ref 0.7–4.0)
MCH: 30.2 pg (ref 26.0–34.0)
MCHC: 29.9 g/dL — ABNORMAL LOW (ref 30.0–36.0)
MCV: 101.2 fL — ABNORMAL HIGH (ref 80.0–100.0)
Monocytes Absolute: 1.2 10*3/uL — ABNORMAL HIGH (ref 0.1–1.0)
Monocytes Relative: 10 %
NEUTROS PCT: 74 %
NRBC: 0 % (ref 0.0–0.2)
Neutro Abs: 8.8 10*3/uL — ABNORMAL HIGH (ref 1.7–7.7)
Platelets: 206 10*3/uL (ref 150–400)
RBC: 3.41 MIL/uL — ABNORMAL LOW (ref 3.87–5.11)
RDW: 13.6 % (ref 11.5–15.5)
WBC: 12 10*3/uL — ABNORMAL HIGH (ref 4.0–10.5)

## 2018-11-22 LAB — CULTURE, BLOOD (ROUTINE X 2)
Special Requests: ADEQUATE
Special Requests: ADEQUATE

## 2018-11-22 LAB — URINE CULTURE: Culture: >=100000 — AB

## 2018-11-22 LAB — STREP PNEUMONIAE URINARY ANTIGEN: Strep Pneumo Urinary Antigen: NEGATIVE

## 2018-11-22 MED ORDER — FUROSEMIDE 40 MG PO TABS
40.0000 mg | ORAL_TABLET | Freq: Two times a day (BID) | ORAL | Status: DC
  Administered 2018-11-22 – 2018-11-23 (×2): 40 mg via ORAL
  Filled 2018-11-22 (×2): qty 1

## 2018-11-22 MED ORDER — CEPHALEXIN 500 MG PO CAPS: 500.0000 mg | ORAL_CAPSULE | Freq: Four times a day (QID) | ORAL | Status: DC

## 2018-11-22 MED ORDER — CEPHALEXIN 500 MG PO CAPS
500.0000 mg | ORAL_CAPSULE | Freq: Three times a day (TID) | ORAL | Status: DC
  Administered 2018-11-22 – 2018-11-24 (×7): 500 mg via ORAL
  Filled 2018-11-22 (×17): qty 1

## 2018-11-22 MED ORDER — APIXABAN 5 MG PO TABS
5.0000 mg | ORAL_TABLET | Freq: Two times a day (BID) | ORAL | Status: DC
  Administered 2018-11-22 – 2018-11-24 (×4): 5 mg via ORAL
  Filled 2018-11-22 (×4): qty 1

## 2018-11-22 MED ORDER — SODIUM CHLORIDE 0.9 % IV SOLN
INTRAVENOUS | Status: DC | PRN
  Administered 2018-11-22: 250 mL via INTRAVENOUS

## 2018-11-22 NOTE — Progress Notes (Signed)
Pharmacy Renal Dosing Adjustment  Drug: apixaban  Original dose: apixaban 2.5mg  bid Scr: 1.07 mg/dL CrClest~31 ml/min New dose: apixaban 5mg  bid  Thank You, Despina Pole, Pharm. D. Clinical Pharmacist 11/22/2018 11:32 AM

## 2018-11-22 NOTE — Progress Notes (Addendum)
PROGRESS NOTE    [name] Walker  PPJ:[id number] DOB: [birth year] DOA: 11/19/2018 PCP: Claretta Fraise, MD    Brief Narrative:  85 year old female with a history of COPD, history of DVT, ischemic colitis, presents to the emergency room with urinary incontinence, generalized pain.  She was found to be hypotensive with concerns for underlying sepsis.  She is being treated for E. coli bacteremia secondary to E. coli urinary tract infection.  She is on antibiotics and is clinically improving.  May need placement and physical therapy evaluation has been requested   Assessment & Plan:   Principal Problem:   Sepsis due to urinary tract infection ([place]) Active Problems:   Depression, recurrent ([place])   Essential hypertension   COPD (chronic obstructive pulmonary disease) ([facility])   GERD   Atrial fibrillation with RVR ([place])   Bacteremia due to Gram-negative bacteria   AKI (acute kidney injury) ([place])   Pressure injury of skin   1. Sepsis secondary to urinary tract infection.  Both blood cultures and urine culture positive for E. coli.she was initially treated with meropenem, but with available sensitivities, she will be de-escalated to Keflex.   Hemodynamics are stabilizing 2. A. fib with RVR.  She is anticoagulated with Eliquis.  Echocardiogram showed ejection fraction 45 to 50%.  She takes verapamil as an outpatient which was initially held due to hypotension.  Her EF is on the lower side.  Since blood pressure has improved, will discontinue calcium channel blockers and start on Coreg.  Heart rate is currently stable 3. Hypertension.  Blood pressures improved with IV hydration.  Started on Coreg. 4. COPD.  No shortness of breath.  Continue bronchodilators as needed. 5. GERD.  Continue on PPI 6. Acute kidney injury.  Likely related to sepsis.  Improving with hydration.  Will discontinue IV fluids since she has become edematous. 7. Chronic systolic heart failure.  Ejection fraction 45 to 50% on  echocardiogram.  Appears to be compensated at this point.  Will be started on low-dose Lasix.  Continue to monitor.  DVT prophylaxis: Apixaban Code Status: DNR Family Communication: No family present Disposition Plan: may need placement, PT eval requested   Consultants:     Procedures:  Echocardiogram:1. The left ventricle has mildly reduced systolic function, with an ejection fraction of 45-50% in the setting of atrial fibrillation. Left ventricular diastolic Doppler parameters are indeterminate.  2. Right ventricular contraction is mildly reduced. Normal estimated RVSP of 27.7 mmHg.  3. Left atrial size was moderately dilated.  4. Right atrial size was moderately dilated.  5. The aortic valve is tricuspid. Mild calcification of the aortic valve. Mild to moderate aortic annular calcification noted. Possible mild to moderate aortic regurgitation based on limited interrogation.  6. The mitral valve is normal in structure. There is moderate mitral annular calcification present. Mitral valve regurgitation is mild to moderate by color flow Doppler.  7. The tricuspid valve is normal in structure.   8. The inferior vena cava was dilated in size with >50% respiratory variability.  Antimicrobials:   Meropenem 2/27>2/29  Keflex 2/29>     Subjective: Feels that her coughing is better. She is weak and has not been out of bed. No chest pain. No vomiting.  Objective: Vitals:   11/22/18 0500 11/22/18 0833 11/22/18 1053 11/22/18 1122  BP:   108/64   Pulse:   87 96  Resp:    (!) 24  Temp:    97.8 F (36.6 C)  TempSrc:    Oral  SpO2:  99%  96%  Weight: 85.7 kg     Height:        Intake/Output Summary (Last 24 hours) at 11/22/2018 1250 Last data filed at 11/22/2018 0500 Gross per 24 hour  Intake 1633.06 ml  Output 900 ml  Net 733.06 ml   Filed Weights   11/20/18 0336 11/21/18 0500 11/22/18 0500  Weight: 81 kg 81.2 kg 85.7 kg    Examination:  General exam: awake, alert, no  distress Respiratory system: occasional rhonchi. Respiratory effort normal. Cardiovascular system:irregular. No murmurs, rubs, gallops. Gastrointestinal system: Abdomen is nondistended, soft and nontender. No organomegaly or masses felt. Normal bowel sounds heard. Central nervous system:  No focal neurological deficits. Extremities: 1+ edema bilaterally Skin: No rashes, lesions or ulcers Psychiatry: confused, pleasant  Data Reviewed: I have personally reviewed following labs and imaging studies  CBC: Recent Labs  Lab 11/19/18 1235 11/20/18 0432 11/21/18 0458 11/22/18 0411  WBC 14.9* 21.2* 18.2* 12.0*  NEUTROABS 12.8* 17.4* 14.8* 8.8*  HGB 10.7* 10.7* 10.5* 10.3*  HCT 34.8* 34.9* 34.0* 34.5*  MCV 99.7 101.7* 101.5* 101.2*  PLT 223 208 213 381   Basic Metabolic Panel: Recent Labs  Lab 11/19/18 1235 11/20/18 0432 11/21/18 0458 11/22/18 0411  NA 137 138 138 136  K 3.8 4.2 4.2 4.1  CL 103 105 107 108  CO2 24 23 24 23   GLUCOSE 101* 164* 123* 83  BUN 15 20 29* 23  CREATININE 1.14* 1.53* 1.38* 1.07*  CALCIUM 8.5* 8.3* 8.3* 8.3*  MG 1.8  --   --   --    GFR: Estimated Creatinine Clearance: 31 mL/min (A) (by C-G formula based on SCr of 1.07 mg/dL (H)). Liver Function Tests: Recent Labs  Lab 11/19/18 1235  AST 21  ALT 11  ALKPHOS 123  BILITOT 0.8  PROT 6.2*  ALBUMIN 3.1*   No results for input(s): LIPASE, AMYLASE in the last 168 hours. No results for input(s): AMMONIA in the last 168 hours. Coagulation Profile: No results for input(s): INR, PROTIME in the last 168 hours. Cardiac Enzymes: No results for input(s): CKTOTAL, CKMB, CKMBINDEX, TROPONINI in the last 168 hours. BNP (last 3 results) No results for input(s): PROBNP in the last 8760 hours. HbA1C: No results for input(s): HGBA1C in the last 72 hours. CBG: No results for input(s): GLUCAP in the last 168 hours. Lipid Profile: No results for input(s): CHOL, HDL, LDLCALC, TRIG, CHOLHDL, LDLDIRECT in the last  72 hours. Thyroid Function Tests: Recent Labs    11/19/18 1252  TSH 1.196   Anemia Panel: No results for input(s): VITAMINB12, FOLATE, FERRITIN, TIBC, IRON, RETICCTPCT in the last 72 hours. Sepsis Labs: Recent Labs  Lab 11/19/18 1235 11/19/18 1447  LATICACIDVEN 1.6 1.3    Recent Results (from the past 240 hour(s))  Blood Culture (routine x 2)     Status: Abnormal   Collection Time: 11/19/18 12:52 PM  Result Value Ref Range Status   Specimen Description   Final    BLOOD LEFT WRIST Performed at [facility], [address], [place], [place] [address]    Special Requests   Final    BOTTLES DRAWN AEROBIC AND ANAEROBIC Blood Culture adequate volume Performed at [facility], [address] [place], [place] [address]    Culture  Setup Time   Final    IN BOTH AEROBIC AND ANAEROBIC BOTTLES GRAM NEGATIVE RODS Gram Stain Report Called to,Read Back By and Verified With: J HEARNS,RN @0501  11/20/18 MKELLY CRITICAL RESULT CALLED  TO, READ BACK BY AND VERIFIED WITH: PHARMD T MEYER [id number] [phone] MLM    Culture ESCHERICHIA COLI (A)  Final   Report Status 11/22/2018 FINAL  Final   Organism ID, Bacteria ESCHERICHIA COLI  Final      Susceptibility   Escherichia coli - MIC*    AMPICILLIN 4 SENSITIVE Sensitive     CEFAZOLIN <=4 SENSITIVE Sensitive     CEFEPIME <=1 SENSITIVE Sensitive     CEFTAZIDIME <=1 SENSITIVE Sensitive     CEFTRIAXONE <=1 SENSITIVE Sensitive     CIPROFLOXACIN <=0.25 SENSITIVE Sensitive     GENTAMICIN <=1 SENSITIVE Sensitive     IMIPENEM <=0.25 SENSITIVE Sensitive     TRIMETH/SULFA <=20 SENSITIVE Sensitive     AMPICILLIN/SULBACTAM <=2 SENSITIVE Sensitive     PIP/TAZO <=4 SENSITIVE Sensitive     Extended ESBL NEGATIVE Sensitive     * ESCHERICHIA COLI  Blood Culture ID Panel (Reflexed)     Status: Abnormal   Collection Time: 11/19/18 12:52 PM  Result Value Ref Range Status   Enterococcus species NOT DETECTED NOT DETECTED Final   Listeria monocytogenes NOT DETECTED NOT  DETECTED Final   Staphylococcus species NOT DETECTED NOT DETECTED Final   Staphylococcus aureus (BCID) NOT DETECTED NOT DETECTED Final   Streptococcus species NOT DETECTED NOT DETECTED Final   Streptococcus agalactiae NOT DETECTED NOT DETECTED Final   Streptococcus pneumoniae NOT DETECTED NOT DETECTED Final   Streptococcus pyogenes NOT DETECTED NOT DETECTED Final   Acinetobacter baumannii NOT DETECTED NOT DETECTED Final   Enterobacteriaceae species DETECTED (A) NOT DETECTED Final    Comment: Enterobacteriaceae represent a large family of gram-negative bacteria, not a single organism. CRITICAL RESULT CALLED TO, READ BACK BY AND VERIFIED WITH: PHARMD T MEYER [id number] MLM    Enterobacter cloacae complex NOT DETECTED NOT DETECTED Final   Escherichia coli DETECTED (A) NOT DETECTED Final    Comment: CRITICAL RESULT CALLED TO, READ BACK BY AND VERIFIED WITH: PHARMD T MEYER [id number] 0832 MLM    Klebsiella oxytoca NOT DETECTED NOT DETECTED Final   Klebsiella pneumoniae NOT DETECTED NOT DETECTED Final   Proteus species NOT DETECTED NOT DETECTED Final   Serratia marcescens NOT DETECTED NOT DETECTED Final   Carbapenem resistance NOT DETECTED NOT DETECTED Final   Haemophilus influenzae NOT DETECTED NOT DETECTED Final   Neisseria meningitidis NOT DETECTED NOT DETECTED Final   Pseudomonas aeruginosa NOT DETECTED NOT DETECTED Final   Candida albicans NOT DETECTED NOT DETECTED Final   Candida glabrata NOT DETECTED NOT DETECTED Final   Candida krusei NOT DETECTED NOT DETECTED Final   Candida parapsilosis NOT DETECTED NOT DETECTED Final   Candida tropicalis NOT DETECTED NOT DETECTED Final    Comment: Performed at [facility], [place] [address] [place], [place] [address]  Blood Culture (routine x 2)     Status: Abnormal   Collection Time: 11/19/18 12:53 PM  Result Value Ref Range Status   Specimen Description   Final    BLOOD RIGHT WRIST DRAWN BY RN Performed at [facility], [address], [place], [place] [address]    Special Requests   Final    BOTTLES DRAWN AEROBIC AND ANAEROBIC Blood Culture adequate volume Performed at [facility], [address], [place], [place] [address]    Culture  Setup Time   Final    ANAEROBIC BOTTLE ONLY GRAM NEGATIVE RODS Gram Stain Report Called to,Read Back By and Verified With: J HEARN,RN @0501  11/20/18 MKELLY Performed at [facility],  [address] [place], [place] [address]    Culture (A)  Final    ESCHERICHIA COLI SUSCEPTIBILITIES PERFORMED ON PREVIOUS CULTURE WITHIN THE LAST 5 DAYS. Performed at [facility], [place] [address], [place], [place] [address]    Report Status 11/22/2018 FINAL  Final  Urine culture     Status: Abnormal   Collection Time: 11/19/18  7:19 PM  Result Value Ref Range Status   Specimen Description   Final    URINE, RANDOM Performed at [facility], [address] [place], [place] [address]    Special Requests   Final    NONE Performed at [facility], [address], [place], [place] [address]    Culture >=100,000 COLONIES/mL ESCHERICHIA COLI (A)  Final   Report Status 11/22/2018 FINAL  Final   Organism ID, Bacteria ESCHERICHIA COLI (A)  Final      Susceptibility   Escherichia coli - MIC*    AMPICILLIN 8 SENSITIVE Sensitive     CEFAZOLIN <=4 SENSITIVE Sensitive     CEFTRIAXONE <=1 SENSITIVE Sensitive     CIPROFLOXACIN <=0.25 SENSITIVE Sensitive     GENTAMICIN <=1 SENSITIVE Sensitive     IMIPENEM <=0.25 SENSITIVE Sensitive     NITROFURANTOIN <=16 SENSITIVE Sensitive     TRIMETH/SULFA <=20 SENSITIVE Sensitive     AMPICILLIN/SULBACTAM <=2 SENSITIVE Sensitive     PIP/TAZO <=4 SENSITIVE Sensitive     Extended ESBL NEGATIVE Sensitive     * >=100,000 COLONIES/mL ESCHERICHIA COLI  MRSA PCR Screening     Status: Abnormal   Collection Time: 11/20/18  3:19 AM  Result Value Ref Range Status   MRSA by PCR POSITIVE (A) NEGATIVE Final    Comment:        The GeneXpert MRSA Assay (FDA approved for NASAL  specimens only), is one component of a comprehensive MRSA colonization surveillance program. It is not intended to diagnose MRSA infection nor to guide or monitor treatment for MRSA infections. RESULT CALLED TO, READ BACK BY AND VERIFIED WITH: WILKINSON,R@0735  BY MATTHEWS, B 2.27.2020 Performed at [facility], [address] [place], [place]          Radiology Studies: Dg Chest [facility] 1 View  Result Date: 11/21/2018 CLINICAL DATA:  85 year old with cough. EXAM: PORTABLE CHEST 1 VIEW COMPARISON:  11/19/2018 FINDINGS: Coarse lung markings appear to be chronic. Low lung volumes without focal disease. Heart size is upper limits of normal and stable. Trachea is midline. Degenerative changes at the shoulder joints. IMPRESSION: Low lung volumes without acute findings. Electronically Signed   By: 12/02/2018 M.D.   On: 11/21/2018 17:49        Scheduled Meds: . amitriptyline  75 mg Oral QHS  . apixaban  5 mg Oral BID  . calcium-vitamin D  1 tablet Oral q morning - 10a  . carvedilol  3.125 mg Oral BID WC  . cephALEXin  500 mg Oral Q6H  . Chlorhexidine Gluconate Cloth  6 each Topical Q0600  . dextromethorphan  30 mg Oral BID  . donepezil  10 mg Oral QHS  . gabapentin  300 mg Oral BID  . guaiFENesin  1,200 mg Oral BID  . ipratropium  0.5 mg Nebulization Q6H  . levalbuterol  0.63 mg Nebulization Q6H  . loratadine  10 mg Oral QHS  . mupirocin ointment  1 application Nasal BID  . nystatin   Topical TID  . pantoprazole  40 mg Oral Daily  . rOPINIRole  1-2 mg Oral  QHS  . sodium chloride flush  3 mL Intravenous Q12H   Continuous Infusions: . sodium chloride 250 mL (11/22/18 0303)     LOS: 3 days    Time spent: 42mins    Kathie Dike, MD [facility]   If 7PM-7AM, please contact night-coverage [email or web address]  11/22/2018, 12:50 PM

## 2018-11-23 DIAGNOSIS — K219 Gastro-esophageal reflux disease without esophagitis: Secondary | ICD-10-CM

## 2018-11-23 LAB — CBC WITH DIFFERENTIAL/PLATELET
Abs Immature Granulocytes: 0.03 10*3/uL (ref 0.00–0.07)
BASOS PCT: 0 %
Basophils Absolute: 0 10*3/uL (ref 0.0–0.1)
Eosinophils Absolute: 0.5 10*3/uL (ref 0.0–0.5)
Eosinophils Relative: 6 %
HCT: 35.3 % — ABNORMAL LOW (ref 36.0–46.0)
Hemoglobin: 10.6 g/dL — ABNORMAL LOW (ref 12.0–15.0)
Immature Granulocytes: 0 %
Lymphocytes Relative: 17 %
Lymphs Abs: 1.4 10*3/uL (ref 0.7–4.0)
MCH: 30.4 pg (ref 26.0–34.0)
MCHC: 30 g/dL (ref 30.0–36.0)
MCV: 101.1 fL — AB (ref 80.0–100.0)
Monocytes Absolute: 1.1 10*3/uL — ABNORMAL HIGH (ref 0.1–1.0)
Monocytes Relative: 13 %
NEUTROS PCT: 64 %
Neutro Abs: 5.3 10*3/uL (ref 1.7–7.7)
PLATELETS: 230 10*3/uL (ref 150–400)
RBC: 3.49 MIL/uL — ABNORMAL LOW (ref 3.87–5.11)
RDW: 13.5 % (ref 11.5–15.5)
WBC: 8.3 10*3/uL (ref 4.0–10.5)
nRBC: 0 % (ref 0.0–0.2)

## 2018-11-23 LAB — BASIC METABOLIC PANEL
Anion gap: 5 (ref 5–15)
BUN: 18 mg/dL (ref 8–23)
CO2: 27 mmol/L (ref 22–32)
Calcium: 8.5 mg/dL — ABNORMAL LOW (ref 8.9–10.3)
Chloride: 108 mmol/L (ref 98–111)
Creatinine, Ser: 0.98 mg/dL (ref 0.44–1.00)
GFR calc Af Amer: 58 mL/min — ABNORMAL LOW (ref >60)
GFR calc non Af Amer: 50 mL/min — ABNORMAL LOW (ref >60)
GLUCOSE: 118 mg/dL — AB (ref 70–99)
Potassium: 4.5 mmol/L (ref 3.5–5.1)
Sodium: 140 mmol/L (ref 135–145)

## 2018-11-23 MED ORDER — METHYLPREDNISOLONE SODIUM SUCC 40 MG IJ SOLR
40.0000 mg | Freq: Four times a day (QID) | INTRAMUSCULAR | Status: DC
  Administered 2018-11-23 – 2018-11-24 (×4): 40 mg via INTRAVENOUS
  Filled 2018-11-23 (×4): qty 1

## 2018-11-23 MED ORDER — FUROSEMIDE 40 MG PO TABS
40.0000 mg | ORAL_TABLET | Freq: Every day | ORAL | Status: DC
  Administered 2018-11-24: 40 mg via ORAL
  Filled 2018-11-23: qty 1

## 2018-11-23 MED ORDER — IPRATROPIUM BROMIDE 0.02 % IN SOLN
0.5000 mg | RESPIRATORY_TRACT | Status: DC
  Administered 2018-11-23 – 2018-11-24 (×5): 0.5 mg via RESPIRATORY_TRACT
  Filled 2018-11-23 (×4): qty 2.5

## 2018-11-23 MED ORDER — LEVALBUTEROL HCL 0.63 MG/3ML IN NEBU
0.6300 mg | INHALATION_SOLUTION | RESPIRATORY_TRACT | Status: DC
  Administered 2018-11-23 – 2018-11-24 (×5): 0.63 mg via RESPIRATORY_TRACT
  Filled 2018-11-23 (×4): qty 3

## 2018-11-23 MED ORDER — LEVALBUTEROL HCL 0.63 MG/3ML IN NEBU
0.6300 mg | INHALATION_SOLUTION | Freq: Four times a day (QID) | RESPIRATORY_TRACT | Status: DC
  Administered 2018-11-23: 0.63 mg via RESPIRATORY_TRACT
  Filled 2018-11-23: qty 3

## 2018-11-23 MED ORDER — POTASSIUM CHLORIDE CRYS ER 20 MEQ PO TBCR
20.0000 meq | EXTENDED_RELEASE_TABLET | Freq: Every day | ORAL | Status: DC
  Administered 2018-11-23 – 2018-11-24 (×2): 20 meq via ORAL
  Filled 2018-11-23: qty 1

## 2018-11-23 MED ORDER — IPRATROPIUM BROMIDE 0.02 % IN SOLN
0.5000 mg | Freq: Four times a day (QID) | RESPIRATORY_TRACT | Status: DC
  Administered 2018-11-23: 0.5 mg via RESPIRATORY_TRACT
  Filled 2018-11-23: qty 2.5

## 2018-11-23 NOTE — Progress Notes (Addendum)
PROGRESS NOTE   [name] Walker  ZWC:[id number] DOB: 12/13/1924 DOA: 11/19/2018 PCP: Claretta Fraise, MD    Brief Narrative:  85 year old female with a history of COPD, history of DVT, ischemic colitis, presents to the emergency room with urinary incontinence, generalized pain.  She was found to be hypotensive with concerns for underlying sepsis.  She is being treated for E. coli bacteremia secondary to E. coli urinary tract infection.  She is on antibiotics and is clinically improving.  Awaiting PT eval.    Assessment & Plan:   Principal Problem:   Sepsis due to urinary tract infection ([place]) Active Problems:   Depression, recurrent (HCC)   Essential hypertension   COPD (chronic obstructive pulmonary disease) (HCC)   GERD   Atrial fibrillation with RVR (HCC)   Bacteremia due to Gram-negative bacteria   AKI (acute kidney injury) ([place])   Pressure injury of skin  1. Sepsis secondary to urinary tract infection.  RESOLVED.  Both blood cultures and urine culture positive for E. coli.she was initially treated with meropenem, but with available sensitivities, she will be de-escalated to Keflex.   Hemodynamics are stabilizing.  Planning home tomorrow if stable.   2. Chronic A. fib with RVR.  Resolved now.  She is anticoagulated with Eliquis.  Echocardiogram showed ejection fraction 45 to 50%.  She takes verapamil as an outpatient which was initially held due to hypotension.  Her EF is on the lower side.  Since blood pressure has improved, will discontinue calcium channel blockers and start on Coreg.  Heart rate is currently stable.  3. Hypertension.  Blood pressures improved with IV hydration.  Started on Coreg. 4. COPD.  No shortness of breath.  Continue bronchodilators as needed. 5. GERD.  Continue on PPI 6. Acute kidney injury.  Resolved after hydration.  7. Chronic systolic heart failure.  Ejection fraction 45 to 50% on echocardiogram.  Appears to be compensated at this point.  Resumed  low-dose Lasix and potassium.   DVT prophylaxis: Apixaban Code Status: DNR Family Communication: No family present Disposition Plan:  PT eval pending, discharge tomorrow 3/2 if stable   Consultants:     Procedures:  Echocardiogram:1. The left ventricle has mildly reduced systolic function, with an ejection fraction of 45-50% in the setting of atrial fibrillation. Left ventricular diastolic Doppler parameters are indeterminate.  2. Right ventricular contraction is mildly reduced. Normal estimated RVSP of 27.7 mmHg.  3. Left atrial size was moderately dilated.  4. Right atrial size was moderately dilated.  5. The aortic valve is tricuspid. Mild calcification of the aortic valve. Mild to moderate aortic annular calcification noted. Possible mild to moderate aortic regurgitation based on limited interrogation.  6. The mitral valve is normal in structure. There is moderate mitral annular calcification present. Mitral valve regurgitation is mild to moderate by color flow Doppler.  7. The tricuspid valve is normal in structure.   8. The inferior vena cava was dilated in size with >50% respiratory variability.  Antimicrobials:   Meropenem 2/27>2/29  Keflex 2/29>   Subjective: Pt coughing and wheezing, not able to get out of bed.  Eating and drinking.    Objective: Vitals:   11/23/18 0500 11/23/18 0809 11/23/18 0900 11/23/18 1112  BP:      Pulse:  100  85  Resp:  19  15  Temp: 98.1 F (36.7 C) 98 F (36.7 C)  97.7 F (36.5 C)  TempSrc: Oral Oral  Oral  SpO2:  95% 98% 97%  Weight:  Height:        Intake/Output Summary (Last 24 hours) at 11/23/2018 1213 Last data filed at 11/23/2018 0806 Gross per 24 hour  Intake 80.68 ml  Output 4300 ml  Net -4219.32 ml   Filed Weights   11/20/18 0336 11/21/18 0500 11/22/18 0500  Weight: 81 kg 81.2 kg 85.7 kg    Examination:  General exam: awake, alert, no distress. Frail. Cooperative, oriented x 3.  Respiratory system: BBS with  wheezing heard diffusely. Respiratory effort normal. Cardiovascular system:irregular. No murmurs, rubs, gallops. Gastrointestinal system: Abdomen is nondistended, soft and nontender. No organomegaly or masses felt. Normal bowel sounds heard. Central nervous system:  No focal neurological deficits. Extremities: trace edema bilaterally Skin: No rashes, lesions or ulcers Psychiatry: confused, pleasant  Data Reviewed: I have personally reviewed following labs and imaging studies  CBC: Recent Labs  Lab 11/19/18 1235 11/20/18 0432 11/21/18 0458 11/22/18 0411 11/23/18 0357  WBC 14.9* 21.2* 18.2* 12.0* 8.3  NEUTROABS 12.8* 17.4* 14.8* 8.8* 5.3  HGB 10.7* 10.7* 10.5* 10.3* 10.6*  HCT 34.8* 34.9* 34.0* 34.5* 35.3*  MCV 99.7 101.7* 101.5* 101.2* 101.1*  PLT 223 208 213 206 235   Basic Metabolic Panel: Recent Labs  Lab 11/19/18 1235 11/20/18 0432 11/21/18 0458 11/22/18 0411 11/23/18 0357  NA 137 138 138 136 140  K 3.8 4.2 4.2 4.1 4.5  CL 103 105 107 108 108  CO2 24 23 24 23 27   GLUCOSE 101* 164* 123* 83 118*  BUN 15 20 29* 23 18  CREATININE 1.14* 1.53* 1.38* 1.07* 0.98  CALCIUM 8.5* 8.3* 8.3* 8.3* 8.5*  MG 1.8  --   --   --   --    GFR: Estimated Creatinine Clearance: 33.9 mL/min (by C-G formula based on SCr of 0.98 mg/dL). Liver Function Tests: Recent Labs  Lab 11/19/18 1235  AST 21  ALT 11  ALKPHOS 123  BILITOT 0.8  PROT 6.2*  ALBUMIN 3.1*   No results for input(s): LIPASE, AMYLASE in the last 168 hours. No results for input(s): AMMONIA in the last 168 hours. Coagulation Profile: No results for input(s): INR, PROTIME in the last 168 hours. Cardiac Enzymes: No results for input(s): CKTOTAL, CKMB, CKMBINDEX, TROPONINI in the last 168 hours. BNP (last 3 results) No results for input(s): PROBNP in the last 8760 hours. HbA1C: No results for input(s): HGBA1C in the last 72 hours. CBG: No results for input(s): GLUCAP in the last 168 hours. Lipid Profile: No results  for input(s): CHOL, HDL, LDLCALC, TRIG, CHOLHDL, LDLDIRECT in the last 72 hours. Thyroid Function Tests: No results for input(s): TSH, T4TOTAL, FREET4, T3FREE, THYROIDAB in the last 72 hours. Anemia Panel: No results for input(s): VITAMINB12, FOLATE, FERRITIN, TIBC, IRON, RETICCTPCT in the last 72 hours. Sepsis Labs: Recent Labs  Lab 11/19/18 1235 11/19/18 1447  LATICACIDVEN 1.6 1.3    Recent Results (from the past 240 hour(s))  Blood Culture (routine x 2)     Status: Abnormal   Collection Time: 11/19/18 12:52 PM  Result Value Ref Range Status   Specimen Description   Final    BLOOD LEFT WRIST Performed at [facility], [address] [place], [place] [address]    Special Requests   Final    BOTTLES DRAWN AEROBIC AND ANAEROBIC Blood Culture adequate volume Performed at [facility], [address], [place], [place] [address]    Culture  Setup Time   Final    IN BOTH AEROBIC AND ANAEROBIC BOTTLES GRAM NEGATIVE RODS Gram  Stain Report Called to,Read Back By and Verified With: J HEARNS,RN @0501  11/20/18 [facility] CRITICAL RESULT CALLED TO, READ BACK BY AND VERIFIED WITH: PHARMD T MEYER [phone] MLM    Culture ESCHERICHIA COLI (A)  Final   Report Status 11/22/2018 FINAL  Final   Organism ID, Bacteria ESCHERICHIA COLI  Final      Susceptibility   Escherichia coli - MIC*    AMPICILLIN 4 SENSITIVE Sensitive     CEFAZOLIN <=4 SENSITIVE Sensitive     CEFEPIME <=1 SENSITIVE Sensitive     CEFTAZIDIME <=1 SENSITIVE Sensitive     CEFTRIAXONE <=1 SENSITIVE Sensitive     CIPROFLOXACIN <=0.25 SENSITIVE Sensitive     GENTAMICIN <=1 SENSITIVE Sensitive     IMIPENEM <=0.25 SENSITIVE Sensitive     TRIMETH/SULFA <=20 SENSITIVE Sensitive     AMPICILLIN/SULBACTAM <=2 SENSITIVE Sensitive     PIP/TAZO <=4 SENSITIVE Sensitive     Extended ESBL NEGATIVE Sensitive     * ESCHERICHIA COLI  Blood Culture ID Panel (Reflexed)     Status: Abnormal   Collection Time: 11/19/18 12:52 PM  Result Value Ref  Range Status   Enterococcus species NOT DETECTED NOT DETECTED Final   Listeria monocytogenes NOT DETECTED NOT DETECTED Final   Staphylococcus species NOT DETECTED NOT DETECTED Final   Staphylococcus aureus (BCID) NOT DETECTED NOT DETECTED Final   Streptococcus species NOT DETECTED NOT DETECTED Final   Streptococcus agalactiae NOT DETECTED NOT DETECTED Final   Streptococcus pneumoniae NOT DETECTED NOT DETECTED Final   Streptococcus pyogenes NOT DETECTED NOT DETECTED Final   Acinetobacter baumannii NOT DETECTED NOT DETECTED Final   Enterobacteriaceae species DETECTED (A) NOT DETECTED Final    Comment: Enterobacteriaceae represent a large family of gram-negative bacteria, not a single organism. CRITICAL RESULT CALLED TO, READ BACK BY AND VERIFIED WITH: PHARMD T MEYER [phone] MLM    Enterobacter cloacae complex NOT DETECTED NOT DETECTED Final   Escherichia coli DETECTED (A) NOT DETECTED Final    Comment: CRITICAL RESULT CALLED TO, READ BACK BY AND VERIFIED WITH: PHARMD T MEYER [phone] MLM    Klebsiella oxytoca NOT DETECTED NOT DETECTED Final   Klebsiella pneumoniae NOT DETECTED NOT DETECTED Final   Proteus species NOT DETECTED NOT DETECTED Final   Serratia marcescens NOT DETECTED NOT DETECTED Final   Carbapenem resistance NOT DETECTED NOT DETECTED Final   Haemophilus influenzae NOT DETECTED NOT DETECTED Final   Neisseria meningitidis NOT DETECTED NOT DETECTED Final   Pseudomonas aeruginosa NOT DETECTED NOT DETECTED Final   Candida albicans NOT DETECTED NOT DETECTED Final   Candida glabrata NOT DETECTED NOT DETECTED Final   Candida krusei NOT DETECTED NOT DETECTED Final   Candida parapsilosis NOT DETECTED NOT DETECTED Final   Candida tropicalis NOT DETECTED NOT DETECTED Final    Comment: Performed at [facility], [place] [address], [place], [place] [address]  Blood Culture (routine x 2)     Status: Abnormal   Collection Time: 11/19/18 12:53 PM  Result Value Ref Range Status    Specimen Description   Final    BLOOD RIGHT WRIST DRAWN BY RN Performed at [facility], [address], [place], [place] [address]    Special Requests   Final    BOTTLES DRAWN AEROBIC AND ANAEROBIC Blood Culture adequate volume Performed at [facility], [address], [place], [place] [address]    Culture  Setup Time   Final    ANAEROBIC BOTTLE ONLY GRAM NEGATIVE RODS Gram Stain Report  Called to,Read Back By and Verified With: Blenda Peals @0501  11/20/18 [facility] Performed at [facility], [address] [place], [place] [address]    Culture (A)  Final    ESCHERICHIA COLI SUSCEPTIBILITIES PERFORMED ON PREVIOUS CULTURE WITHIN THE LAST 5 DAYS. Performed at [facility], [place] [address] [place], [place] [address]    Report Status 11/22/2018 FINAL  Final  Urine culture     Status: Abnormal   Collection Time: 11/19/18  7:19 PM  Result Value Ref Range Status   Specimen Description   Final    URINE, RANDOM Performed at [facility] [place], [address] [place], [place] [address]    Special Requests   Final    NONE Performed at [facility], [address], [place], [place] [address]    Culture >=100,000 COLONIES/mL ESCHERICHIA COLI (A)  Final   Report Status 11/22/2018 FINAL  Final   Organism ID, Bacteria ESCHERICHIA COLI (A)  Final      Susceptibility   Escherichia coli - MIC*    AMPICILLIN 8 SENSITIVE Sensitive     CEFAZOLIN <=4 SENSITIVE Sensitive     CEFTRIAXONE <=1 SENSITIVE Sensitive     CIPROFLOXACIN <=0.25 SENSITIVE Sensitive     GENTAMICIN <=1 SENSITIVE Sensitive     IMIPENEM <=0.25 SENSITIVE Sensitive     NITROFURANTOIN <=16 SENSITIVE Sensitive     TRIMETH/SULFA <=20 SENSITIVE Sensitive     AMPICILLIN/SULBACTAM <=2 SENSITIVE Sensitive     PIP/TAZO <=4 SENSITIVE Sensitive     Extended ESBL NEGATIVE Sensitive     * >=100,000 COLONIES/mL ESCHERICHIA COLI  MRSA PCR Screening     Status: Abnormal   Collection Time: 11/20/18  3:19 AM  Result Value Ref Range Status    MRSA by PCR POSITIVE (A) NEGATIVE Final    Comment:        The GeneXpert MRSA Assay (FDA approved for NASAL specimens only), is one component of a comprehensive MRSA colonization surveillance program. It is not intended to diagnose MRSA infection nor to guide or monitor treatment for MRSA infections. RESULT CALLED TO, READ BACK BY AND VERIFIED WITH: WILKINSON,R@0735  BY MATTHEWS, B 2.27.2020 Performed at [facility], [address] [place], [place]     Radiology Studies: Dg Chest [facility] 1 View  Result Date: 11/21/2018 CLINICAL DATA:  85 year old with cough. EXAM: PORTABLE CHEST 1 VIEW COMPARISON:  11/19/2018 FINDINGS: Coarse lung markings appear to be chronic. Low lung volumes without focal disease. Heart size is upper limits of normal and stable. Trachea is midline. Degenerative changes at the shoulder joints. IMPRESSION: Low lung volumes without acute findings. Electronically Signed   By: 12/02/2018 M.D.   On: 11/21/2018 17:49    Scheduled Meds: . amitriptyline  75 mg Oral QHS  . apixaban  5 mg Oral BID  . calcium-vitamin D  1 tablet Oral q morning - 10a  . carvedilol  3.125 mg Oral BID WC  . cephALEXin  500 mg Oral Q8H  . Chlorhexidine Gluconate Cloth  6 each Topical Q0600  . dextromethorphan  30 mg Oral BID  . donepezil  10 mg Oral QHS  . furosemide  40 mg Oral BID  . gabapentin  300 mg Oral BID  . guaiFENesin  1,200 mg Oral BID  . ipratropium  0.5 mg Nebulization Q6H WA  . levalbuterol  0.63 mg Nebulization Q6H WA  . loratadine  10 mg Oral QHS  . mupirocin ointment  1 application Nasal BID  . nystatin  Topical TID  . pantoprazole  40 mg Oral Daily  . rOPINIRole  1-2 mg Oral QHS  . sodium chloride flush  3 mL Intravenous Q12H   Continuous Infusions: . sodium chloride 250 mL (11/22/18 0303)     LOS: 4 days   Time spent: 32 mins  Ambers Iyengar Wynetta Emery, MD [facility] How to contact the [facility] Attending or Consulting provider [place] or covering provider  during after hours [place], for this patient?  1. Check the care team in [facility] and look for a) attending/consulting TRH provider listed and b) the [facility] team listed 2. Log into [email or web address] and use [place]'s universal password to access. If you do not have the password, please contact the hospital operator. 3. Locate the [facility] provider you are looking for under [facility] and page to a number that you can be directly reached. 4. If you still have difficulty reaching the provider, please page the [facility] (Director on Call) for the Hospitalists listed on amion for assistance.   If 7PM-7AM, please contact night-coverage [email or web address]  11/23/2018, 12:13 PM

## 2018-11-24 ENCOUNTER — Other Ambulatory Visit: Payer: Self-pay | Admitting: Family Medicine

## 2018-11-24 MED ORDER — IPRATROPIUM BROMIDE 0.02 % IN SOLN
0.5000 mg | Freq: Four times a day (QID) | RESPIRATORY_TRACT | Status: DC
  Filled 2018-11-24: qty 2.5

## 2018-11-24 MED ORDER — DEXTROMETHORPHAN POLISTIREX ER 30 MG/5ML PO SUER: 30.0000 mg | Freq: Two times a day (BID) | ORAL | 0 refills | Status: DC | PRN

## 2018-11-24 MED ORDER — CEPHALEXIN 500 MG PO CAPS: 500.0000 mg | ORAL_CAPSULE | Freq: Three times a day (TID) | ORAL | 0 refills | Status: AC

## 2018-11-24 MED ORDER — FUROSEMIDE 40 MG PO TABS: 40.0000 mg | ORAL_TABLET | Freq: Every day | ORAL | 5 refills | Status: DC

## 2018-11-24 MED ORDER — CALCIUM CARBONATE-VITAMIN D 500-200 MG-UNIT PO TABS: 1.0000 | ORAL_TABLET | Freq: Every day | ORAL | Status: DC

## 2018-11-24 MED ORDER — GUAIFENESIN ER 600 MG PO TB12: 600.0000 mg | ORAL_TABLET | Freq: Two times a day (BID) | ORAL | 0 refills | Status: AC

## 2018-11-24 MED ORDER — LEVALBUTEROL HCL 0.63 MG/3ML IN NEBU
0.6300 mg | INHALATION_SOLUTION | Freq: Four times a day (QID) | RESPIRATORY_TRACT | Status: DC
  Filled 2018-11-24: qty 3

## 2018-11-24 MED ORDER — PREDNISONE 20 MG PO TABS: ORAL_TABLET | ORAL | 0 refills | Status: DC

## 2018-11-24 MED ORDER — NYSTATIN 100000 UNIT/GM EX POWD: Freq: Three times a day (TID) | CUTANEOUS | 0 refills | Status: DC

## 2018-11-24 MED ORDER — CARVEDILOL 3.125 MG PO TABS: 3.1250 mg | ORAL_TABLET | Freq: Two times a day (BID) | ORAL | 0 refills | Status: DC

## 2018-11-24 MED ORDER — POTASSIUM CHLORIDE CRYS ER 20 MEQ PO TBCR: 20.0000 meq | EXTENDED_RELEASE_TABLET | Freq: Every day | ORAL | 0 refills | Status: DC

## 2018-11-24 NOTE — Clinical Social Work Note (Signed)
Clinical Social Work Assessment  Patient Details  Name: [name] Walker MRN: [id number] Date of Birth: 01-Feb-1925  Date of referral:  11/24/18               Reason for consult:  Financial Concerns                Permission sought to share information with:    Permission granted to share information::     Name::        Agency::     Relationship::     Contact Information:     Housing/Transportation Living arrangements for the past 2 months:  Single Family Home Source of Information:  Patient Patient Interpreter Needed:  None Criminal Activity/Legal Involvement Pertinent to Current Situation/Hospitalization:  No - Comment as needed Significant Relationships:  Adult Children Lives with:  Self Do you feel safe going back to the place where you live?  Yes Need for family participation in patient care:  No (Coment)  Care giving concerns: Pt expressed concern about her finances.   Social Worker assessment / plan: Received referral to see pt due to her expressing that family messing with her money. Met with pt for an extended amount of time today. Pt appears alert and oriented x4. She is somewhat difficult to understand without teeth. Pt lives alone and states she likes it that way. Pt describes a long history of issues related to her children and grandchildren. It appears that they are not allowing her to drive and have taken over managing her finances. Pt states that her daughter had her sign financial POA without her realizing what she was signing. It is unclear if pt thinks that family members are taking her money or if she is only upset that they took control of her money/bill paying responsibilities. Pt does not say anything specific about family members taking money that belongs to her.   Plan if for dc to home with [facility] PT today. Will ask MD to also order [facility] LCSW to further assess pt in her home environment.  There are no other CSW needs identified.  Employment status:   Retired Forensic scientist:  Medicare PT Recommendations:  Home with [place] / Referral to community resources:     Patient/Family's Response to care: Pt accepting of care.   Patient/Family's Understanding of and Emotional Response to Diagnosis, Current Treatment, and Prognosis: Pt appears to understand diagnosis and treatment recommendations. Emotional support provided to patient regarding her feelings related to loss of independence.  Emotional Assessment Appearance:  Appears stated age Attitude/Demeanor/Rapport:  Engaged Affect (typically observed):  Pleasant Orientation:  Oriented to Self, Oriented to Place, Oriented to  Time, Oriented to Situation Alcohol / Substance use:  Not Applicable Psych involvement (Current and /or in the community):  No (Comment)  Discharge Needs  Concerns to be addressed:  Financial / Insurance Concerns Readmission within the last 30 days:  No Current discharge risk:  None Barriers to Discharge:  No Barriers Identified   Shade Flood, LCSW 11/24/2018, 1:31 PM

## 2018-11-24 NOTE — Evaluation (Signed)
Physical Therapy Evaluation Patient Details Name: [name] Walker MRN: [id number] DOB: 09/21/1925 Today's Date: 11/24/2018   History of Present Illness  [name] Walker is a 85 y.o. female with medical history significant of cataract, COPD, depression, insomnia, history of DVT, history of gastric erosion, GERD, history ischemic colitis, osteoarthritis, history of parotid gland squamous cell carcinoma who is brought to the emergency department due to generalized pain, decreased mobility and incontinence (per triage notes the patient has been urinating on himself and have foul-smelling urine odor).  She is unable to elaborate further and states that she has been hurting all over for more than a month.  Apparently she has not been wearing her oxygen at home and there might be some unknown compliance issues.  There has not having any fever, rigors or cough to our knowledge.    Clinical Impression  Patient functioning near baseline for functional mobility and gait, had difficulty sitting up at bedside, but usually sleeps in a lift chair at home, demonstrates good return for transferring from chair using armrest of chair, limited to a few  Steps in room due to mild SOB/fatigue with O2 saturation between 90-93% while on room air and tolerated sitting up in chair with 2 LPM O2 with SpO2 at 94-96% - RN notified.  Patient to be discharged home today and discharged to care of nursing for ambulation and out of bed as tolerated for LOS.     Follow Up Recommendations Home health PT;Supervision - Intermittent;Supervision for mobility/OOB    Equipment Recommendations  None recommended by PT    Recommendations for Other Services       Precautions / Restrictions Precautions Precautions: Fall Restrictions Weight Bearing Restrictions: No      Mobility  Bed Mobility Overal bed mobility: Needs Assistance Bed Mobility: Supine to Sit     Supine to sit: Min assist     General bed mobility comments:  slow labored movement, has to use bedrail  Transfers Overall transfer level: Needs assistance Equipment used: Rolling walker (2 wheeled) Transfers: Sit to/from [facility] Sit to Stand: Min guard Stand pivot transfers: Min guard       General transfer comment: slightly labored movement  Ambulation/Gait Ambulation/Gait assistance: Min guard Gait Distance (Feet): 12 Feet Assistive device: Rolling walker (2 wheeled) Gait Pattern/deviations: Decreased step length - right;Decreased step length - left;Decreased stride length Gait velocity: slow   General Gait Details: slightly labored slow cadence, no loss of balance, limited secondary to mild SOB/fatigue  Stairs            Wheelchair Mobility    Modified Rankin (Stroke Patients Only)       Balance Overall balance assessment: Needs assistance Sitting-balance support: Feet supported;No upper extremity supported Sitting balance-Leahy Scale: Good     Standing balance support: During functional activity;Bilateral upper extremity supported Standing balance-Leahy Scale: Fair Standing balance comment: using RW                             Pertinent Vitals/Pain Pain Assessment: Faces Faces Pain Scale: Hurts a little bit Pain Location: right shoulder Pain Descriptors / Indicators: Aching;Sore Pain Intervention(s): Limited activity within patient's tolerance;Monitored during session    [place] expects to be discharged to:: Private residence Living Arrangements: Alone Available Help at Discharge: Family;Available PRN/intermittently Type of Home: House Home Access: Ramped entrance     Home Layout: One level Home Equipment: [place] - 4 wheels;Walker - 2  wheels;Bedside commode;Shower seat;Cane - single point Additional Comments: has grab bar beside her lift chair, patient sleeps in a lift chair    Prior Function Level of Independence: Needs assistance   Gait / Transfers  Assistance Needed: household ambulator with [place], uses grab bar for sit to stands out of her lift chair  ADL's / Homemaking Assistance Needed: family assist with some household ADLs        Hand Dominance   Dominant Hand: Left    Extremity/Trunk Assessment   Upper Extremity Assessment Upper Extremity Assessment: Generalized weakness    Lower Extremity Assessment Lower Extremity Assessment: Generalized weakness    Cervical / Trunk Assessment Cervical / Trunk Assessment: Normal  Communication   Communication: No difficulties  Cognition Arousal/Alertness: Awake/alert Behavior During Therapy: WFL for tasks assessed/performed Overall Cognitive Status: Within Functional Limits for tasks assessed                                        General Comments      Exercises     Assessment/Plan    PT Assessment All further PT needs can be met in the next venue of care  PT Problem List Decreased strength;Decreased activity tolerance;Decreased balance;Decreased mobility       PT Treatment Interventions      PT Goals (Current goals can be found in the Care Plan section)  Acute Rehab PT Goals Patient Stated Goal: Return home with family to assist PT Goal Formulation: With patient Time For Goal Achievement: 11/24/18    Frequency     Barriers to discharge        Co-evaluation               AM-PAC PT "6 Clicks" Mobility  Outcome Measure Help needed turning from your back to your side while in a flat bed without using bedrails?: A Little Help needed moving from lying on your back to sitting on the side of a flat bed without using bedrails?: A Lot Help needed moving to and from a bed to a chair (including a wheelchair)?: A Little Help needed standing up from a chair using your arms (e.g., wheelchair or bedside chair)?: A Little Help needed to walk in hospital room?: A Little Help needed climbing 3-5 steps with a railing? : A Lot 6 Click Score: 16     End of Session Equipment Utilized During Treatment: Oxygen Activity Tolerance: Patient tolerated treatment well;Patient limited by fatigue Patient left: in chair;with call bell/phone within reach Nurse Communication: Mobility status PT Visit Diagnosis: Unsteadiness on feet (R26.81);Other abnormalities of gait and mobility (R26.89);Muscle weakness (generalized) (M62.81)    Time: 2800-3491 PT Time Calculation (min) (ACUTE ONLY): 37 min   Charges:   PT Evaluation $PT Eval Moderate Complexity: 1 Mod PT Treatments $Therapeutic Activity: 23-37 mins        1:59 PM, 11/24/18 Lonell Grandchild, MPT Physical Therapist with [facility] [phone] office [phone] mobile phone

## 2018-11-24 NOTE — Care Management Important Message (Signed)
Important Message  Patient Details  Name: [name] Walker MRN: [id number] Date of Birth: [birth year]   Medicare Important Message Given:  Yes    Jasmine Mcbeth, Chauncey Reading, RN 11/24/2018, 1:38 PM

## 2018-11-24 NOTE — Discharge Summary (Signed)
Physician Discharge Summary  EIMI VINEY JJH:[id number] DOB: 04/01/1925 DOA: 11/19/2018  PCP: Claretta Fraise, MD  Admit date: 11/19/2018 Discharge date: 11/24/2018  Admitted From: Home  Disposition: Home with [facility] services  Recommendations for Outpatient Follow-up:  1. Follow up with PCP in 1 weeks  Home Health:  Physical Therapy   Discharge Condition: STABLE   CODE STATUS: DNR    Brief Hospitalization Summary: Please see all hospital notes, images, labs for full details of the hospitalization. Dr. Lavetta Nielsen HPI: [name] Walker is a 85 y.o. female with medical history significant of cataract, COPD, depression, insomnia, history of DVT, history of gastric erosion, GERD, history ischemic colitis, osteoarthritis, history of parotid gland squamous cell carcinoma who is brought to the emergency department due to generalized pain, decreased mobility and incontinence (per triage notes the patient has been urinating on himself and have foul-smelling urine odor).  She is unable to elaborate further and states that she has been hurting all over for more than a month.  Apparently she has not been wearing her oxygen at home and there might be some unknown compliance issues.  There has not having any fever, rigors or cough to our knowledge.  ED Course: Her vital signs on arrival are temperature 99.4, pulse 55, respirations 20, blood pressure 90/52 mmHg and O2 sat 92% on room air. She received a 1000 mL NS bolus, morphine 4 mg IVP x1 and ceftriaxone 1 g IVPB.  White count was 14.9 with 86% neutrophils, 3% lymphocytes and 10% monocytes.  Lactic acid has been normal x2.  CMP shows normal electrolytes, glucose of 101, BUN 15, creatinine 1.14, magnesium 1.8 and calcium 8.5 mg/dL.  Total protein was 6.2 and albumin 3.1 g/dL.  The rest of the hepatic function panel was within normal.  Imaging: CT head without contrast showed no acute findings, but there were remote lacunar infarcts in the right thalamus  and right lentiform nucleus, which are unchanged.  There is chronically moist sinusitis.  There is chronic superior ophthalmic vein dilatation which is nonspecific, consider increase CVP or Graves' disease.  There is no thickening of the extraocular muscles.  Her chest radiograph did not have any acute disease.  MRI of lumbar spine with and without contrast showed no acute finding, but there was diffuse DDD with mild to moderate spinal canal stenosis throughout most of the lumbar spine.  There was multilevel mild to moderate neuronal foraminal stenosis.  See images and full radiology report for further detail.  Brief Narrative:  85 year old female with a history of COPD, history of DVT, ischemic colitis, presents to the emergency room with urinary incontinence, generalized pain.  She was found to be hypotensive with concerns for underlying sepsis.  She is being treated for E. coli bacteremia secondary to E. coli urinary tract infection.  She is on antibiotics and is clinically improving.  PT recommending [facility].    Assessment & Plan:   Principal Problem:   Sepsis due to urinary tract infection ([place]) Active Problems:   Depression, recurrent (HCC)   Essential hypertension   COPD (chronic obstructive pulmonary disease)    GERD   Atrial fibrillation with RVR (HCC)   Bacteremia due to Gram-negative bacteria   AKI (acute kidney injury) ([place])   Pressure injury of skin  1. E coli bacteremia / Sepsis secondary to urinary tract infection.  RESOLVED.  Both blood cultures and urine culture positive for E. coli.she was initially treated with meropenem, but with available sensitivities, she will be  de-escalated to Keflex to complete 7 more days.  Hemodynamics are stable.  2. Chronic A. fib with RVR.  Resolved now.  She is anticoagulated with Eliquis.  Echocardiogram showed ejection fraction 45 to 50%.  She takes verapamil as an outpatient which was initially held due to hypotension.  Her EF is on the lower  side.  Since blood pressure has improved, discontinued calcium channel blockers(verapamil) and started on Coreg which she has tolerated well and HR has been well controlled in hospital.   3. Hypertension.  Stable and well controlled on Coreg. 4. COPD.  No shortness of breath.  Continue bronchodilators as needed. 5. GERD.  Continue on PPI 6. Acute kidney injury.  Resolved after hydration.  7. Chronic systolic heart failure.  Ejection fraction 45 to 50% on echocardiogram.  Appears to be compensated at this point.  Resumed low-dose Lasix and potassium and started coreg.    DVT prophylaxis: Apixaban Code Status: DNR Family Communication: No family present Disposition Plan:  PT recommending HHPT  Procedures:  Echocardiogram:1. The left ventricle has mildly reduced systolic function, with an ejection fraction of 45-50% in the setting of atrial fibrillation. Left ventricular diastolic Doppler parameters are indeterminate. 2. Right ventricular contraction is mildly reduced. Normal estimated RVSP of 27.7 mmHg. 3. Left atrial size was moderately dilated. 4. Right atrial size was moderately dilated. 5. The aortic valve is tricuspid. Mild calcification of the aortic valve. Mild to moderate aortic annular calcification noted. Possible mild to moderate aortic regurgitation based on limited interrogation. 6. The mitral valve is normal in structure. There is moderate mitral annular calcification present. Mitral valve regurgitation is mild to moderate by color flow Doppler. 7. The tricuspid valve is normal in structure.  8. The inferior vena cava was dilated in size with >50% respiratory variability.  Antimicrobials:   Meropenem 2/27>2/29  Keflex 2/29>   Discharge Diagnoses:  Principal Problem:   Sepsis due to urinary tract infection ([place]) Active Problems:   Depression, recurrent (HCC)   Essential hypertension   COPD (chronic obstructive pulmonary disease) (HCC)   GERD   Atrial  fibrillation with RVR (HCC)   Bacteremia due to Gram-negative bacteria   AKI (acute kidney injury) ([place])   Pressure injury of skin   Discharge Instructions: Discharge Instructions    Call MD for:  difficulty breathing, headache or visual disturbances   Complete by:  As directed    Call MD for:  extreme fatigue   Complete by:  As directed    Call MD for:  persistant dizziness or light-headedness   Complete by:  As directed    Call MD for:  persistant nausea and vomiting   Complete by:  As directed    Call MD for:  severe uncontrolled pain   Complete by:  As directed    Increase activity slowly   Complete by:  As directed      Allergies as of 11/24/2018      Reactions   Diltiazem Anxiety, Other (See Comments)   Sleep disturbance   Iohexol     Desc: THROAT SWELLING-NOTED IN CHART AFTER IVC PLACEMENT-ARS 12-02-05   Latex Other (See Comments)   unknown   Penicillins Swelling   Patient was given zosyn this admission and tolerated. After d/w MD and pt, she had swelling in arm only, so possible just infiltration and not allergic. ABx changed to Cefazolin and is tolerating   Shellfish Allergy Other (See Comments)   Reaction unknown      Medication List  STOP taking these medications   potassium chloride 10 MEQ tablet Commonly known as:  K-DUR   verapamil 180 MG CR tablet Commonly known as:  CALAN-SR     TAKE these medications   acetaminophen 500 MG tablet Commonly known as:  TYLENOL Take 500-1,000 mg by mouth every 6 (six) hours as needed for mild pain or moderate pain.   albuterol (2.5 MG/3ML) 0.083% nebulizer solution Commonly known as:  PROVENTIL Take 3 mLs (2.5 mg total) by nebulization every 2 (two) hours as needed for wheezing or shortness of breath.   amitriptyline 75 MG tablet Commonly known as:  ELAVIL Take 1 tablet (75 mg total) by mouth at bedtime.   calcium-vitamin D 500-200 MG-UNIT tablet Take 1 tablet by mouth every morning.   carvedilol 3.125 MG  tablet Commonly known as:  COREG Take 1 tablet (3.125 mg total) by mouth 2 (two) times daily with a meal for 30 days.   cephALEXin 500 MG capsule Commonly known as:  KEFLEX Take 1 capsule (500 mg total) by mouth every 8 (eight) hours for 7 days.   dextromethorphan 30 MG/5ML liquid Commonly known as:  DELSYM Take 5 mLs (30 mg total) by mouth 2 (two) times daily as needed for cough.   donepezil 10 MG tablet Commonly known as:  ARICEPT Take 1 tablet (10 mg total) by mouth at bedtime.   ELIQUIS 5 MG Tabs tablet Generic drug:  apixaban TAKE (1) TABLET TWICE A DAY. What changed:  See the new instructions.   furosemide 40 MG tablet Commonly known as:  LASIX Take 1 tablet (40 mg total) by mouth daily. Start taking on:  November 25, 2018 What changed:    how much to take  how to take this  when to take this  additional instructions   gabapentin 300 MG capsule Commonly known as:  NEURONTIN TAKE (1) CAPSULE TWICE DAILY. What changed:    how much to take  how to take this  when to take this  additional instructions   guaiFENesin 600 MG 12 hr tablet Commonly known as:  MUCINEX Take 1 tablet (600 mg total) by mouth 2 (two) times daily for 5 days.   levocetirizine 5 MG tablet Commonly known as:  XYZAL Take 1 tablet (5 mg total) by mouth at bedtime.   nystatin powder Commonly known as:  MYCOSTATIN/NYSTOP Apply topically 3 (three) times daily.   omeprazole 20 MG capsule Commonly known as:  PRILOSEC TAKE 2 CAPSULES DAILY AS NEEDED FOR REFLUX What changed:    how much to take  how to take this  when to take this  additional instructions   oxyCODONE-acetaminophen 10-325 MG tablet Commonly known as:  PERCOCET Take 1 tablet by mouth every 8 (eight) hours as needed.   OXYGEN Inhale into the lungs at bedtime.   potassium chloride SA 20 MEQ tablet Commonly known as:  K-DUR,KLOR-CON Take 1 tablet (20 mEq total) by mouth daily for 30 days. Start taking on:  November 25, 2018   predniSONE 20 MG tablet Commonly known as:  DELTASONE Take 2 PO QAM x5 days   rOPINIRole 1 MG tablet Commonly known as:  REQUIP TAKE 1 OR 2 TABLETS AT BEDTIME What changed:    how much to take  how to take this  when to take this  additional instructions   VITAMIN K2-VITAMIN D3 PO Take 2 capsules by mouth every morning.      Follow-up Information    Claretta Fraise, MD. Schedule an  appointment as soon as possible for a visit in 1 week(s).   Specialty:  Family Medicine Why:  Hospital follow-up Contact information: [address] [place] [address] [phone]          Allergies  Allergen Reactions  . Diltiazem Anxiety and Other (See Comments)    Sleep disturbance  . Iohexol      Desc: THROAT SWELLING-NOTED IN CHART AFTER IVC PLACEMENT-ARS 12-02-05   . Latex Other (See Comments)    unknown  . Penicillins Swelling    Patient was given zosyn this admission and tolerated. After d/w MD and pt, she had swelling in arm only, so possible just infiltration and not allergic. ABx changed to Cefazolin and is tolerating  . Shellfish Allergy Other (See Comments)    Reaction unknown   Allergies as of 11/24/2018      Reactions   Diltiazem Anxiety, Other (See Comments)   Sleep disturbance   Iohexol     Desc: THROAT SWELLING-NOTED IN CHART AFTER IVC PLACEMENT-ARS 12-02-05   Latex Other (See Comments)   unknown   Penicillins Swelling   Patient was given zosyn this admission and tolerated. After d/w MD and pt, she had swelling in arm only, so possible just infiltration and not allergic. ABx changed to Cefazolin and is tolerating   Shellfish Allergy Other (See Comments)   Reaction unknown      Medication List    STOP taking these medications   potassium chloride 10 MEQ tablet Commonly known as:  K-DUR   verapamil 180 MG CR tablet Commonly known as:  CALAN-SR     TAKE these medications   acetaminophen 500 MG tablet Commonly known as:  TYLENOL Take  500-1,000 mg by mouth every 6 (six) hours as needed for mild pain or moderate pain.   albuterol (2.5 MG/3ML) 0.083% nebulizer solution Commonly known as:  PROVENTIL Take 3 mLs (2.5 mg total) by nebulization every 2 (two) hours as needed for wheezing or shortness of breath.   amitriptyline 75 MG tablet Commonly known as:  ELAVIL Take 1 tablet (75 mg total) by mouth at bedtime.   calcium-vitamin D 500-200 MG-UNIT tablet Take 1 tablet by mouth every morning.   carvedilol 3.125 MG tablet Commonly known as:  COREG Take 1 tablet (3.125 mg total) by mouth 2 (two) times daily with a meal for 30 days.   cephALEXin 500 MG capsule Commonly known as:  KEFLEX Take 1 capsule (500 mg total) by mouth every 8 (eight) hours for 7 days.   dextromethorphan 30 MG/5ML liquid Commonly known as:  DELSYM Take 5 mLs (30 mg total) by mouth 2 (two) times daily as needed for cough.   donepezil 10 MG tablet Commonly known as:  ARICEPT Take 1 tablet (10 mg total) by mouth at bedtime.   ELIQUIS 5 MG Tabs tablet Generic drug:  apixaban TAKE (1) TABLET TWICE A DAY. What changed:  See the new instructions.   furosemide 40 MG tablet Commonly known as:  LASIX Take 1 tablet (40 mg total) by mouth daily. Start taking on:  November 25, 2018 What changed:    how much to take  how to take this  when to take this  additional instructions   gabapentin 300 MG capsule Commonly known as:  NEURONTIN TAKE (1) CAPSULE TWICE DAILY. What changed:    how much to take  how to take this  when to take this  additional instructions   guaiFENesin 600 MG 12 hr tablet Commonly  known as:  MUCINEX Take 1 tablet (600 mg total) by mouth 2 (two) times daily for 5 days.   levocetirizine 5 MG tablet Commonly known as:  XYZAL Take 1 tablet (5 mg total) by mouth at bedtime.   nystatin powder Commonly known as:  MYCOSTATIN/NYSTOP Apply topically 3 (three) times daily.   omeprazole 20 MG capsule Commonly known as:   PRILOSEC TAKE 2 CAPSULES DAILY AS NEEDED FOR REFLUX What changed:    how much to take  how to take this  when to take this  additional instructions   oxyCODONE-acetaminophen 10-325 MG tablet Commonly known as:  PERCOCET Take 1 tablet by mouth every 8 (eight) hours as needed.   OXYGEN Inhale into the lungs at bedtime.   potassium chloride SA 20 MEQ tablet Commonly known as:  K-DUR,KLOR-CON Take 1 tablet (20 mEq total) by mouth daily for 30 days. Start taking on:  November 25, 2018   predniSONE 20 MG tablet Commonly known as:  DELTASONE Take 2 PO QAM x5 days   rOPINIRole 1 MG tablet Commonly known as:  REQUIP TAKE 1 OR 2 TABLETS AT BEDTIME What changed:    how much to take  how to take this  when to take this  additional instructions   VITAMIN K2-VITAMIN D3 PO Take 2 capsules by mouth every morning.       Procedures/Studies: Dg Chest 2 View  Result Date: 11/19/2018 CLINICAL DATA:  Fever. EXAM: CHEST - 2 VIEW COMPARISON:  PA and lateral chest 03/24/2018. FINDINGS: No consolidative process, pneumothorax or effusion. Heart size is enlarged. Aortic atherosclerosis noted. Advanced degenerative change about the shoulders. IMPRESSION: No acute disease. Atherosclerosis. Electronically Signed   By: Inge Rise M.D.   On: 11/19/2018 14:10   Ct Head Wo Contrast  Result Date: 11/19/2018 CLINICAL DATA:  Altered level of consciousness EXAM: CT HEAD WITHOUT CONTRAST TECHNIQUE: Contiguous axial images were obtained from the base of the skull through the vertex without intravenous contrast. COMPARISON:  05/23/2016 FINDINGS: Brain: Remote lacunar infarcts in the right thalamus and right lentiform nucleus, no change from 05/23/2016. Periventricular white matter and corona radiata hypodensities favor chronic ischemic microvascular white matter disease. Otherwise, the brainstem, cerebellum, cerebral peduncles, thalamus, basal ganglia, basilar cisterns, and ventricular system appear  within normal limits. No intracranial hemorrhage, mass lesion, or acute CVA. Vascular: There is atherosclerotic calcification of the cavernous carotid arteries bilaterally. Skull: Unremarkable Sinuses/Orbits: Mild chronic ethmoid sinusitis. Bilateral chronic superior ophthalmic vein dilatation. Other: No supplemental non-categorized findings. IMPRESSION: 1. Remote lacunar infarcts in the right thalamus and right lentiform nucleus, not changed from 2017. 2. Chronic ethmoid sinusitis. 3. Bilateral chronic superior ophthalmic vein dilatation, nonspecific. This can be associated with ophthalmic vein varices, raised intracranial pressure, or Graves disease. No thickening of the extraocular muscles observed. 4. No acute findings. Electronically Signed   By: Van Clines M.D.   On: 11/19/2018 14:37   Mr Lumbar Spine W Wo Contrast  Result Date: 11/19/2018 CLINICAL DATA:  Increased pain and decreased mobility EXAM: MRI LUMBAR SPINE WITHOUT AND WITH CONTRAST TECHNIQUE: Multiplanar and multiecho pulse sequences of the lumbar spine were obtained without and with intravenous contrast. CONTRAST:  7.5 mL Gadavist COMPARISON:  PET CT 06/09/2015 Lumbar spine CT 05/30/2013 FINDINGS: Segmentation: Normal. The lowest disc space is considered to be L5-S1. Alignment:  Mild sigmoid scoliosis of lumbar spine. Vertebrae: No acute compression fracture, discitis-osteomyelitis of focal marrow lesion. Conus medullaris and cauda equina: The conus medullaris terminates at the L1 level.  The cauda equina and conus medullaris are both normal. Paraspinal and other soft tissues: The visualized retroperitoneal organs and paraspinal soft tissues are normal. Disc levels: Sagittal plane imaging includes the T10-11 disc level through the upper sacrum, with axial imaging of the T12-L1 to L5-S1 disc levels. T10-11: Disc space narrowing without spinal canal stenosis. T11-12: Disc space narrowing without spinal canal stenosis. T12-L1: Disc space  narrowing with diffuse bulge and mild spinal canal stenosis. Mild right and moderate left neural foraminal stenosis. L1-2: Disc space narrowing and mild bulge. Mild spinal canal stenosis. Moderate left and mild right neural foraminal stenosis. L2-3: Disc space narrowing with left eccentric bulge. Mild spinal canal stenosis, greatest in the left lateral recess. Mild right and moderate left neural foraminal stenosis. L3-4: Diffuse disc bulge with moderate spinal canal stenosis. Mild facet hypertrophy. Mild bilateral neural foraminal stenosis. L4-5: Disc space narrowing with mild disc bulge. No spinal canal stenosis. Moderate right foraminal stenosis. L5-S1: Disc desiccation and height loss with mild bulge. No spinal canal stenosis. Mild left foraminal stenosis. The visualized portion of the sacrum is normal. IMPRESSION: 1. Diffuse degenerative disc disease with mild-to-moderate spinal canal stenosis throughout most of the lumbar spine. 2. Multilevel mild-to-moderate neural foraminal stenosis. 3. No acute abnormality of the lumbar spine. Electronically Signed   By: Ulyses Jarred M.D.   On: 11/19/2018 16:48   Dg Chest Port 1 View  Result Date: 11/21/2018 CLINICAL DATA:  85 year old with cough. EXAM: PORTABLE CHEST 1 VIEW COMPARISON:  11/19/2018 FINDINGS: Coarse lung markings appear to be chronic. Low lung volumes without focal disease. Heart size is upper limits of normal and stable. Trachea is midline. Degenerative changes at the shoulder joints. IMPRESSION: Low lung volumes without acute findings. Electronically Signed   By: Markus Daft M.D.   On: 11/21/2018 17:49      Subjective: Pt says she feels well to go home, no more wheezing and SOB is resolved now.    Discharge Exam: Vitals:   11/24/18 1200 11/24/18 1237  BP: 108/65   Pulse: 98   Resp: 18   Temp:  97.6 F (36.4 C)  SpO2: 90%    Vitals:   11/24/18 0600 11/24/18 0626 11/24/18 1200 11/24/18 1237  BP: 124/63  108/65   Pulse: 63  98   Resp:  14  18   Temp:  (!) 97.4 F (36.3 C)  97.6 F (36.4 C)  TempSrc:  Oral  Oral  SpO2: 93%  90%   Weight:      Height:        General exam: awake, alert, no distress. Frail. Cooperative, oriented x 3.  Respiratory system: BBS clear to auscultation. Respiratory effort normal. Cardiovascular system:irregular. No murmurs, rubs, gallops. Gastrointestinal system: Abdomen is nondistended, soft and nontender. No organomegaly or masses felt. Normal bowel sounds heard. Central nervous system:  No focal neurological deficits. Extremities: trace edema bilaterally Skin: No rashes, lesions or ulcers Psychiatry: pleasant  The results of significant diagnostics from this hospitalization (including imaging, microbiology, ancillary and laboratory) are listed below for reference.     Microbiology: Recent Results (from the past 240 hour(s))  Blood Culture (routine x 2)     Status: Abnormal   Collection Time: 11/19/18 12:52 PM  Result Value Ref Range Status   Specimen Description   Final    BLOOD LEFT WRIST Performed at [facility], [address], [place], [place] [address]    Special Requests   Final    BOTTLES DRAWN AEROBIC AND  ANAEROBIC Blood Culture adequate volume Performed at [place] [place], [place] [address]    Culture  Setup Time   Final    IN BOTH AEROBIC AND ANAEROBIC BOTTLES GRAM NEGATIVE RODS Gram Stain Report Called to,Read Back By and Verified With: J HEARNS,RN @0501  11/20/18 [facility] CRITICAL RESULT CALLED TO, READ BACK BY AND VERIFIED WITH: PHARMD T MEYER [phone] MLM    Culture ESCHERICHIA COLI (A)  Final   Report Status 11/22/2018 FINAL  Final   Organism ID, Bacteria ESCHERICHIA COLI  Final      Susceptibility   Escherichia coli - MIC*    AMPICILLIN 4 SENSITIVE Sensitive     CEFAZOLIN <=4 SENSITIVE Sensitive     CEFEPIME <=1 SENSITIVE Sensitive     CEFTAZIDIME <=1 SENSITIVE Sensitive     CEFTRIAXONE <=1 SENSITIVE Sensitive     CIPROFLOXACIN <=0.25  SENSITIVE Sensitive     GENTAMICIN <=1 SENSITIVE Sensitive     IMIPENEM <=0.25 SENSITIVE Sensitive     TRIMETH/SULFA <=20 SENSITIVE Sensitive     AMPICILLIN/SULBACTAM <=2 SENSITIVE Sensitive     PIP/TAZO <=4 SENSITIVE Sensitive     Extended ESBL NEGATIVE Sensitive     * ESCHERICHIA COLI  Blood Culture ID Panel (Reflexed)     Status: Abnormal   Collection Time: 11/19/18 12:52 PM  Result Value Ref Range Status   Enterococcus species NOT DETECTED NOT DETECTED Final   Listeria monocytogenes NOT DETECTED NOT DETECTED Final   Staphylococcus species NOT DETECTED NOT DETECTED Final   Staphylococcus aureus (BCID) NOT DETECTED NOT DETECTED Final   Streptococcus species NOT DETECTED NOT DETECTED Final   Streptococcus agalactiae NOT DETECTED NOT DETECTED Final   Streptococcus pneumoniae NOT DETECTED NOT DETECTED Final   Streptococcus pyogenes NOT DETECTED NOT DETECTED Final   Acinetobacter baumannii NOT DETECTED NOT DETECTED Final   Enterobacteriaceae species DETECTED (A) NOT DETECTED Final    Comment: Enterobacteriaceae represent a large family of gram-negative bacteria, not a single organism. CRITICAL RESULT CALLED TO, READ BACK BY AND VERIFIED WITH: PHARMD T MEYER [phone] MLM    Enterobacter cloacae complex NOT DETECTED NOT DETECTED Final   Escherichia coli DETECTED (A) NOT DETECTED Final    Comment: CRITICAL RESULT CALLED TO, READ BACK BY AND VERIFIED WITH: PHARMD T MEYER [phone] MLM    Klebsiella oxytoca NOT DETECTED NOT DETECTED Final   Klebsiella pneumoniae NOT DETECTED NOT DETECTED Final   Proteus species NOT DETECTED NOT DETECTED Final   Serratia marcescens NOT DETECTED NOT DETECTED Final   Carbapenem resistance NOT DETECTED NOT DETECTED Final   Haemophilus influenzae NOT DETECTED NOT DETECTED Final   Neisseria meningitidis NOT DETECTED NOT DETECTED Final   Pseudomonas aeruginosa NOT DETECTED NOT DETECTED Final   Candida albicans NOT DETECTED NOT DETECTED Final   Candida  glabrata NOT DETECTED NOT DETECTED Final   Candida krusei NOT DETECTED NOT DETECTED Final   Candida parapsilosis NOT DETECTED NOT DETECTED Final   Candida tropicalis NOT DETECTED NOT DETECTED Final    Comment: Performed at [facility], [place] [address], [place], [place] [address]  Blood Culture (routine x 2)     Status: Abnormal   Collection Time: 11/19/18 12:53 PM  Result Value Ref Range Status   Specimen Description   Final    BLOOD RIGHT WRIST DRAWN BY RN Performed at [facility], [address] [place], [place] [address]    Special Requests   Final    BOTTLES DRAWN AEROBIC  AND ANAEROBIC Blood Culture adequate volume Performed at [facility], [address] [place], [place] [address]    Culture  Setup Time   Final    ANAEROBIC BOTTLE ONLY GRAM NEGATIVE RODS Gram Stain Report Called to,Read Back By and Verified With: J HEARN,RN @0501  11/20/18 [facility] Performed at [facility], [address], [place], [place] [id number]    Culture (A)  Final    ESCHERICHIA COLI SUSCEPTIBILITIES PERFORMED ON PREVIOUS CULTURE WITHIN THE LAST 5 DAYS. Performed at [facility], [place] [address] [place], [place] [address]    Report Status 11/22/2018 FINAL  Final  Urine culture     Status: Abnormal   Collection Time: 11/19/18  7:19 PM  Result Value Ref Range Status   Specimen Description   Final    URINE, RANDOM Performed at [facility], [address], [place], [place] [address]    Special Requests   Final    NONE Performed at [facility], [address], [place], [place] [address]    Culture >=100,000 COLONIES/mL ESCHERICHIA COLI (A)  Final   Report Status 11/22/2018 FINAL  Final   Organism ID, Bacteria ESCHERICHIA COLI (A)  Final      Susceptibility   Escherichia coli - MIC*    AMPICILLIN 8 SENSITIVE Sensitive     CEFAZOLIN <=4 SENSITIVE Sensitive     CEFTRIAXONE <=1 SENSITIVE Sensitive     CIPROFLOXACIN <=0.25 SENSITIVE Sensitive     GENTAMICIN <=1 SENSITIVE Sensitive      IMIPENEM <=0.25 SENSITIVE Sensitive     NITROFURANTOIN <=16 SENSITIVE Sensitive     TRIMETH/SULFA <=20 SENSITIVE Sensitive     AMPICILLIN/SULBACTAM <=2 SENSITIVE Sensitive     PIP/TAZO <=4 SENSITIVE Sensitive     Extended ESBL NEGATIVE Sensitive     * >=100,000 COLONIES/mL ESCHERICHIA COLI  MRSA PCR Screening     Status: Abnormal   Collection Time: 11/20/18  3:19 AM  Result Value Ref Range Status   MRSA by PCR POSITIVE (A) NEGATIVE Final    Comment:        The GeneXpert MRSA Assay (FDA approved for NASAL specimens only), is one component of a comprehensive MRSA colonization surveillance program. It is not intended to diagnose MRSA infection nor to guide or monitor treatment for MRSA infections. RESULT CALLED TO, READ BACK BY AND VERIFIED WITH: WILKINSON,R@0735  BY MATTHEWS, B 2.27.2020 Performed at [address] [place],  [address]      Labs: BNP (last 3 results) Recent Labs    11/19/18 2328  BNP 578.4*   Basic Metabolic Panel: Recent Labs  Lab 11/19/18 1235 11/20/18 0432 11/21/18 0458 11/22/18 0411 11/23/18 0357  NA 137 138 138 136 140  K 3.8 4.2 4.2 4.1 4.5  CL 103 105 107 108 108  CO2 24 23 24 23 27   GLUCOSE 101* 164* 123* 83 118*  BUN 15 20 29* 23 18  CREATININE 1.14* 1.53* 1.38* 1.07* 0.98  CALCIUM 8.5* 8.3* 8.3* 8.3* 8.5*  MG 1.8  --   --   --   --    Liver Function Tests: Recent Labs  Lab 11/19/18 1235  AST 21  ALT 11  ALKPHOS 123  BILITOT 0.8  PROT 6.2*  ALBUMIN 3.1*   No results for input(s): LIPASE, AMYLASE in the last 168 hours. No results for input(s): AMMONIA in the last 168 hours. CBC: Recent Labs  Lab 11/19/18 1235 11/20/18 0432 11/21/18 0458 11/22/18 0411 11/23/18 0357  WBC 14.9* 21.2*  18.2* 12.0* 8.3  NEUTROABS 12.8* 17.4* 14.8* 8.8* 5.3  HGB 10.7* 10.7* 10.5* 10.3* 10.6*  HCT 34.8* 34.9* 34.0* 34.5* 35.3*  MCV 99.7 101.7* 101.5* 101.2* 101.1*  PLT 223 208 213 206 230   Cardiac Enzymes: No results  for input(s): CKTOTAL, CKMB, CKMBINDEX, TROPONINI in the last 168 hours. BNP: Invalid input(s): POCBNP CBG: No results for input(s): GLUCAP in the last 168 hours. D-Dimer No results for input(s): DDIMER in the last 72 hours. Hgb A1c No results for input(s): HGBA1C in the last 72 hours. Lipid Profile No results for input(s): CHOL, HDL, LDLCALC, TRIG, CHOLHDL, LDLDIRECT in the last 72 hours. Thyroid function studies No results for input(s): TSH, T4TOTAL, T3FREE, THYROIDAB in the last 72 hours.  Invalid input(s): FREET3 Anemia work up No results for input(s): VITAMINB12, FOLATE, FERRITIN, TIBC, IRON, RETICCTPCT in the last 72 hours. Urinalysis    Component Value Date/Time   COLORURINE AMBER (A) 11/19/2018 1230   APPEARANCEUR CLOUDY (A) 11/19/2018 1230   LABSPEC 1.019 11/19/2018 1230   PHURINE 5.0 11/19/2018 1230   GLUCOSEU NEGATIVE 11/19/2018 1230   HGBUR MODERATE (A) 11/19/2018 1230   BILIRUBINUR NEGATIVE 11/19/2018 1230   KETONESUR NEGATIVE 11/19/2018 1230   PROTEINUR 100 (A) 11/19/2018 1230   UROBILINOGEN 0.2 01/19/2013 0040   NITRITE NEGATIVE 11/19/2018 1230   LEUKOCYTESUR MODERATE (A) 11/19/2018 1230   Sepsis Labs Invalid input(s): PROCALCITONIN,  WBC,  LACTICIDVEN Microbiology Recent Results (from the past 240 hour(s))  Blood Culture (routine x 2)     Status: Abnormal   Collection Time: 11/19/18 12:52 PM  Result Value Ref Range Status   Specimen Description   Final    BLOOD LEFT WRIST Performed at [facility], [address] [place], [place] [address]    Special Requests   Final    BOTTLES DRAWN AEROBIC AND ANAEROBIC Blood Culture adequate volume Performed at [facility], [address], [place], [place] [address]    Culture  Setup Time   Final    IN BOTH AEROBIC AND ANAEROBIC BOTTLES GRAM NEGATIVE RODS Gram Stain Report Called to,Read Back By and Verified With: J HEARNS,RN @0501  11/20/18 [facility] CRITICAL RESULT CALLED TO, READ BACK BY AND VERIFIED WITH: PHARMD  T MEYER [phone] MLM    Culture ESCHERICHIA COLI (A)  Final   Report Status 11/22/2018 FINAL  Final   Organism ID, Bacteria ESCHERICHIA COLI  Final      Susceptibility   Escherichia coli - MIC*    AMPICILLIN 4 SENSITIVE Sensitive     CEFAZOLIN <=4 SENSITIVE Sensitive     CEFEPIME <=1 SENSITIVE Sensitive     CEFTAZIDIME <=1 SENSITIVE Sensitive     CEFTRIAXONE <=1 SENSITIVE Sensitive     CIPROFLOXACIN <=0.25 SENSITIVE Sensitive     GENTAMICIN <=1 SENSITIVE Sensitive     IMIPENEM <=0.25 SENSITIVE Sensitive     TRIMETH/SULFA <=20 SENSITIVE Sensitive     AMPICILLIN/SULBACTAM <=2 SENSITIVE Sensitive     PIP/TAZO <=4 SENSITIVE Sensitive     Extended ESBL NEGATIVE Sensitive     * ESCHERICHIA COLI  Blood Culture ID Panel (Reflexed)     Status: Abnormal   Collection Time: 11/19/18 12:52 PM  Result Value Ref Range Status   Enterococcus species NOT DETECTED NOT DETECTED Final   Listeria monocytogenes NOT DETECTED NOT DETECTED Final   Staphylococcus species NOT DETECTED NOT DETECTED Final   Staphylococcus aureus (BCID) NOT DETECTED NOT DETECTED Final   Streptococcus species NOT DETECTED NOT DETECTED Final   Streptococcus  agalactiae NOT DETECTED NOT DETECTED Final   Streptococcus pneumoniae NOT DETECTED NOT DETECTED Final   Streptococcus pyogenes NOT DETECTED NOT DETECTED Final   Acinetobacter baumannii NOT DETECTED NOT DETECTED Final   Enterobacteriaceae species DETECTED (A) NOT DETECTED Final    Comment: Enterobacteriaceae represent a large family of gram-negative bacteria, not a single organism. CRITICAL RESULT CALLED TO, READ BACK BY AND VERIFIED WITH: PHARMD T MEYER [phone] MLM    Enterobacter cloacae complex NOT DETECTED NOT DETECTED Final   Escherichia coli DETECTED (A) NOT DETECTED Final    Comment: CRITICAL RESULT CALLED TO, READ BACK BY AND VERIFIED WITH: PHARMD T MEYER [phone] MLM    Klebsiella oxytoca NOT DETECTED NOT DETECTED Final   Klebsiella pneumoniae NOT  DETECTED NOT DETECTED Final   Proteus species NOT DETECTED NOT DETECTED Final   Serratia marcescens NOT DETECTED NOT DETECTED Final   Carbapenem resistance NOT DETECTED NOT DETECTED Final   Haemophilus influenzae NOT DETECTED NOT DETECTED Final   Neisseria meningitidis NOT DETECTED NOT DETECTED Final   Pseudomonas aeruginosa NOT DETECTED NOT DETECTED Final   Candida albicans NOT DETECTED NOT DETECTED Final   Candida glabrata NOT DETECTED NOT DETECTED Final   Candida krusei NOT DETECTED NOT DETECTED Final   Candida parapsilosis NOT DETECTED NOT DETECTED Final   Candida tropicalis NOT DETECTED NOT DETECTED Final    Comment: Performed at [facility], [place] [address], [place], [place] [address]  Blood Culture (routine x 2)     Status: Abnormal   Collection Time: 11/19/18 12:53 PM  Result Value Ref Range Status   Specimen Description   Final    BLOOD RIGHT WRIST DRAWN BY RN Performed at [facility], [address] [place], [place] [address]    Special Requests   Final    BOTTLES DRAWN AEROBIC AND ANAEROBIC Blood Culture adequate volume Performed at [facility], [address] [place], [place] [address]    Culture  Setup Time   Final    ANAEROBIC BOTTLE ONLY GRAM NEGATIVE RODS Gram Stain Report Called to,Read Back By and Verified With: J HEARN,RN @0501  11/20/18 [facility] Performed at [facility], [address], [place], [place] [address]    Culture (A)  Final    ESCHERICHIA COLI SUSCEPTIBILITIES PERFORMED ON PREVIOUS CULTURE WITHIN THE LAST 5 DAYS. Performed at [facility], [place] [address], [place], [place] [address]    Report Status 11/22/2018 FINAL  Final  Urine culture     Status: Abnormal   Collection Time: 11/19/18  7:19 PM  Result Value Ref Range Status   Specimen Description   Final    URINE, RANDOM Performed at [facility], [address], [place], [place] [address]    Special Requests   Final    NONE Performed at [facility], [address],  [place], [place] [address]    Culture >=100,000 COLONIES/mL ESCHERICHIA COLI (A)  Final   Report Status 11/22/2018 FINAL  Final   Organism ID, Bacteria ESCHERICHIA COLI (A)  Final      Susceptibility   Escherichia coli - MIC*    AMPICILLIN 8 SENSITIVE Sensitive     CEFAZOLIN <=4 SENSITIVE Sensitive     CEFTRIAXONE <=1 SENSITIVE Sensitive     CIPROFLOXACIN <=0.25 SENSITIVE Sensitive     GENTAMICIN <=1 SENSITIVE Sensitive     IMIPENEM <=0.25 SENSITIVE Sensitive     NITROFURANTOIN <=16 SENSITIVE Sensitive     TRIMETH/SULFA <=20 SENSITIVE Sensitive  AMPICILLIN/SULBACTAM <=2 SENSITIVE Sensitive     PIP/TAZO <=4 SENSITIVE Sensitive     Extended ESBL NEGATIVE Sensitive     * >=100,000 COLONIES/mL ESCHERICHIA COLI  MRSA PCR Screening     Status: Abnormal   Collection Time: 11/20/18  3:19 AM  Result Value Ref Range Status   MRSA by PCR POSITIVE (A) NEGATIVE Final    Comment:        The GeneXpert MRSA Assay (FDA approved for NASAL specimens only), is one component of a comprehensive MRSA colonization surveillance program. It is not intended to diagnose MRSA infection nor to guide or monitor treatment for MRSA infections. RESULT CALLED TO, READ BACK BY AND VERIFIED WITH: WILKINSON,R@0735  BY MATTHEWS, B 2.27.2020 Performed at [facility], [address], [place], [place] [address]     Time coordinating discharge: 34 mins  SIGNED:  Irwin Brakeman, MD  [facility] 11/24/2018, 12:56 PM How to contact the [facility] Attending or Consulting provider [place] or covering provider during after hours [place], for this patient?  1. Check the care team in [facility] and look for a) attending/consulting [facility] provider listed and b) the [facility] team listed 2. Log into [email or web address] and use [place]'s universal password to access. If you do not have the password, please contact the hospital operator. 3. Locate the [facility] provider you are looking for under [facility] and page to a number that you can be  directly reached. 4. If you still have difficulty reaching the provider, please page the [facility] (Director on Call) for the Hospitalists listed on amion for assistance.

## 2018-11-24 NOTE — Discharge Instructions (Signed)
°  IMPORTANT INFORMATION: PAY CLOSE ATTENTION  ° °PHYSICIAN DISCHARGE INSTRUCTIONS ° °Follow with Primary care provider  Stacks, Warren, MD  and other consultants as instructed your Hospitalist Physician ° °SEEK MEDICAL CARE OR RETURN TO EMERGENCY ROOM IF SYMPTOMS COME BACK, WORSEN OR NEW PROBLEM DEVELOPS.  ° °Please note: °You were cared for by a hospitalist during your hospital stay. Every effort will be made to forward records to your primary care provider.  You can request that your primary care provider send for your hospital records if they have not received them.  Once you are discharged, your primary care physician will handle any further medical issues. Please note that NO REFILLS for any discharge medications will be authorized once you are discharged, as it is imperative that you return to your primary care physician (or establish a relationship with a primary care physician if you do not have one) for your post hospital discharge needs so that they can reassess your need for medications and monitor your lab values. ° °Please get a complete blood count and chemistry panel checked by your Primary MD at your next visit, and again as instructed by your Primary MD. ° °Get Medicines reviewed and adjusted: °Please take all your medications with you for your next visit with your Primary MD ° °Laboratory/radiological data: °Please request your Primary MD to go over all hospital tests and procedure/radiological results at the follow up, please ask your primary care provider to get all Hospital records sent to his/her office. ° °In some cases, they will be blood work, cultures and biopsy results pending at the time of your discharge. Please request that your primary care provider follow up on these results. ° °If you are diabetic, please bring your blood sugar readings with you to your follow up appointment with primary care.   ° °Please call and make your follow up appointments as soon as possible.   ° °Also Note  the following: °If you experience worsening of your admission symptoms, develop shortness of breath, life threatening emergency, suicidal or homicidal thoughts you must seek medical attention immediately by calling 911 or calling your MD immediately  if symptoms less severe. ° °You must read complete instructions/literature along with all the possible adverse reactions/side effects for all the Medicines you take and that have been prescribed to you. Take any new Medicines after you have completely understood and accpet all the possible adverse reactions/side effects.  ° °Do not drive when taking Pain medications or sleeping medications (Benzodiazepines) ° °Do not take more than prescribed Pain, Sleep and Anxiety Medications. It is not advisable to combine anxiety,sleep and pain medications without talking with your primary care practitioner ° °Special Instructions: If you have smoked or chewed Tobacco  in the last 2 yrs please stop smoking, stop any regular Alcohol  and or any Recreational drug use. ° °Wear Seat belts while driving. ° ° ° ° ° ° °

## 2018-11-24 NOTE — Care Management Note (Signed)
Case Management Note  Patient Details  Name: [name] Walker MRN: [id number] Date of Birth: [birth year]  Subjective/Objective:           Sepsis.          Action/Plan: Discharging home today. Seen by PT, recommended for home health PT. Patient agreeable, has had [facility] in the past, would like again. Has oxygen at home, RW, [facility] and lift chair. Wears life alert.  Nephew lives next door, daughter three doors down.  Granddaughter to transport home today.    Expected Discharge Date:      11/24/18            Expected Discharge Plan:  [place]  In-House Referral:     Discharge planning Services  CM Consult  Post Acute Care Choice:  Home Health Choice offered to:  Patient  DME Arranged:    DME Agency:     HH Arranged:  PT [place]:  [place]  Status of Service:  Completed, signed off  If discussed at [place] of Stay Meetings, dates discussed:    Additional Comments:  Merlon Alcorta, Chauncey [place], RN 11/24/2018, 11:02 AM

## 2018-11-25 DIAGNOSIS — I5022 Chronic systolic (congestive) heart failure: Secondary | ICD-10-CM | POA: Diagnosis not present

## 2018-11-25 DIAGNOSIS — K219 Gastro-esophageal reflux disease without esophagitis: Secondary | ICD-10-CM | POA: Diagnosis not present

## 2018-11-25 DIAGNOSIS — A419 Sepsis, unspecified organism: Secondary | ICD-10-CM | POA: Diagnosis not present

## 2018-11-25 DIAGNOSIS — B9620 Unspecified Escherichia coli [E. coli] as the cause of diseases classified elsewhere: Secondary | ICD-10-CM | POA: Diagnosis not present

## 2018-11-25 DIAGNOSIS — Z7952 Long term (current) use of systemic steroids: Secondary | ICD-10-CM | POA: Diagnosis not present

## 2018-11-25 DIAGNOSIS — Z86718 Personal history of other venous thrombosis and embolism: Secondary | ICD-10-CM | POA: Diagnosis not present

## 2018-11-25 DIAGNOSIS — Z7901 Long term (current) use of anticoagulants: Secondary | ICD-10-CM | POA: Diagnosis not present

## 2018-11-25 DIAGNOSIS — I482 Chronic atrial fibrillation, unspecified: Secondary | ICD-10-CM | POA: Diagnosis not present

## 2018-11-25 DIAGNOSIS — N390 Urinary tract infection, site not specified: Secondary | ICD-10-CM | POA: Diagnosis not present

## 2018-11-25 DIAGNOSIS — J449 Chronic obstructive pulmonary disease, unspecified: Secondary | ICD-10-CM | POA: Diagnosis not present

## 2018-11-26 ENCOUNTER — Other Ambulatory Visit: Payer: Self-pay | Admitting: *Deleted

## 2018-11-26 ENCOUNTER — Telehealth: Payer: Self-pay | Admitting: *Deleted

## 2018-11-26 ENCOUNTER — Other Ambulatory Visit: Payer: Self-pay | Admitting: Family Medicine

## 2018-11-26 NOTE — Telephone Encounter (Signed)
Transitional Care Management  Telephone Note  11/26/2018  [name] Walker is a primary care patient of Claretta Fraise, MD who was hospitalized on 11/19/2018 with complaints of incontinence and foul urine odor.   An outgoing telephone call was made and I spoke with the patient.  An appointment for Transitional Care Management is scheduled on 12/03/2018 with Dr Lajuana Ripple. (Patient scheduled her own appointment and there were no available openings with Dr Livia Snellen within the timeframe)  The patient or caregiver is knowledgeable of his/her condition(s) and treatment: Yes   DISCHARGE INFORMATION Date of Discharge:11/24/2018  Discharge Facility: [facility]  Principal Discharge Diagnosis: Acute cystitis  Outpatient Follow Up Recommendations (from discharge summary) Follow up with PCP in 1 week   MEDICATION RECONCILIATION All of the medications prescribed at discharge have been picked up.   A post discharge medication reconciliation was performed and the complete medication list was reviewed with the patient/caregiver and is current as of 11/26/2018.   Current Medication List Outpatient Encounter Medications as of 11/26/2018  Medication Sig  . acetaminophen (TYLENOL) 500 MG tablet Take 500-1,000 mg by mouth every 6 (six) hours as needed for mild pain or moderate pain.   Marland Kitchen albuterol (PROVENTIL) (2.5 MG/3ML) 0.083% nebulizer solution Take 3 mLs (2.5 mg total) by nebulization every 2 (two) hours as needed for wheezing or shortness of breath.  Marland Kitchen amitriptyline (ELAVIL) 75 MG tablet Take 1 tablet (75 mg total) by mouth at bedtime.  . Calcium Carbonate-Vitamin D (CALCIUM-VITAMIN D) 500-200 MG-UNIT tablet Take 1 tablet by mouth every morning.  . carvedilol (COREG) 3.125 MG tablet Take 1 tablet (3.125 mg total) by mouth 2 (two) times daily with a meal for 30 days.  . cephALEXin (KEFLEX) 500 MG capsule Take 1 capsule (500 mg total) by mouth every 8 (eight) hours for 7 days.  Marland Kitchen dextromethorphan (DELSYM)  30 MG/5ML liquid Take 5 mLs (30 mg total) by mouth 2 (two) times daily as needed for cough.  . donepezil (ARICEPT) 10 MG tablet Take 1 tablet (10 mg total) by mouth at bedtime.  Marland Kitchen ELIQUIS 5 MG TABS tablet TAKE (1) TABLET TWICE A DAY. (Patient taking differently: Take 5 mg by mouth 2 (two) times daily. )  . furosemide (LASIX) 40 MG tablet Take 1 tablet (40 mg total) by mouth daily.  Marland Kitchen gabapentin (NEURONTIN) 300 MG capsule TAKE (1) CAPSULE TWICE DAILY. (Patient taking differently: Take 300 mg by mouth 2 (two) times daily. )  . guaiFENesin (MUCINEX) 600 MG 12 hr tablet Take 1 tablet (600 mg total) by mouth 2 (two) times daily for 5 days.  Marland Kitchen levocetirizine (XYZAL) 5 MG tablet Take 1 tablet (5 mg total) by mouth at bedtime.  Marland Kitchen nystatin (MYCOSTATIN/NYSTOP) powder Apply topically 3 (three) times daily.  Marland Kitchen omeprazole (PRILOSEC) 20 MG capsule TAKE 2 CAPSULES DAILY AS NEEDED FOR REFLUX (Patient taking differently: Take 20 mg by mouth 2 (two) times daily before a meal. )  . oxyCODONE-acetaminophen (PERCOCET) 10-325 MG tablet Take 1 tablet by mouth every 8 (eight) hours as needed.  . OXYGEN Inhale into the lungs at bedtime.  . potassium chloride SA (K-DUR,KLOR-CON) 20 MEQ tablet Take 1 tablet (20 mEq total) by mouth daily for 30 days.  . predniSONE (DELTASONE) 20 MG tablet Take 2 PO QAM x5 days  . rOPINIRole (REQUIP) 1 MG tablet TAKE 1 OR 2 TABLETS AT BEDTIME (Patient taking differently: Take 1-2 mg by mouth at bedtime. )  . Vitamin D-Vitamin K (VITAMIN K2-VITAMIN D3 PO)  Take 2 capsules by mouth every morning.   Facility-Administered Encounter Medications as of 11/26/2018  Medication  . cyanocobalamin ((VITAMIN B-12)) injection 1,000 mcg    ACTIVITIES OF DAILY LIVING  Patient is able to perform ADLs independently. The primary caregiver is: self with some help from friends. She has two children but per Ms Carrol, they are mad at her. She hasn't spoken to them since her discharge.   Patient is receiving  home health services: Yes Nursing   PATIENT EDUCATION . Take all medications as prescribed . Contact our office sooner than your scheduled appointment if you have any questions or concerns   Chong Sicilian, RN-BC, BSN Nurse Case Manager [place] Ph: [phone]

## 2018-11-27 ENCOUNTER — Other Ambulatory Visit: Payer: Self-pay

## 2018-11-27 DIAGNOSIS — J449 Chronic obstructive pulmonary disease, unspecified: Secondary | ICD-10-CM | POA: Diagnosis not present

## 2018-11-27 DIAGNOSIS — B9620 Unspecified Escherichia coli [E. coli] as the cause of diseases classified elsewhere: Secondary | ICD-10-CM | POA: Diagnosis not present

## 2018-11-27 DIAGNOSIS — K219 Gastro-esophageal reflux disease without esophagitis: Secondary | ICD-10-CM | POA: Diagnosis not present

## 2018-11-27 DIAGNOSIS — A419 Sepsis, unspecified organism: Secondary | ICD-10-CM | POA: Diagnosis not present

## 2018-11-27 DIAGNOSIS — I482 Chronic atrial fibrillation, unspecified: Secondary | ICD-10-CM | POA: Diagnosis not present

## 2018-11-27 DIAGNOSIS — Z7952 Long term (current) use of systemic steroids: Secondary | ICD-10-CM | POA: Diagnosis not present

## 2018-11-27 DIAGNOSIS — N390 Urinary tract infection, site not specified: Secondary | ICD-10-CM | POA: Diagnosis not present

## 2018-11-27 DIAGNOSIS — Z86718 Personal history of other venous thrombosis and embolism: Secondary | ICD-10-CM | POA: Diagnosis not present

## 2018-11-27 DIAGNOSIS — Z7901 Long term (current) use of anticoagulants: Secondary | ICD-10-CM | POA: Diagnosis not present

## 2018-11-27 DIAGNOSIS — I5022 Chronic systolic (congestive) heart failure: Secondary | ICD-10-CM | POA: Diagnosis not present

## 2018-11-27 NOTE — Patient Outreach (Signed)
[place] [facility]) Care Management  11/27/2018  [name] Walker [birth year] [id number]   EMMI- General Discharge RED ON EMMI ALERT Day # 1 Date: 11/26/2018 Red Alert Reason:  Scheduled follow-up? No  Unfilled prescriptions? Yes  Able to fill today/tomorrow? No  Other questions/problems? Yes   Outreach attempt:spoke with patient. She is able to verify HIPAA.  She states that she feels much better and getting along fine.  Addressed red alerts.  Patient states that she has not made her appointment with her PCP for follow up yet but will be calling this week to make appointment.  Patient declined needing CM assistance to make the appointment.  Patient states she has all her medication and denies any problems with medications.  Patient declined any further question, concerns, or needs.    Plan: RN CM will close case.    Jone Baseman, RN, MSN [facility] Care Management Care Management Coordinator Direct Line [phone] Toll Free: [phone]  Fax: [phone]

## 2018-11-27 NOTE — Telephone Encounter (Signed)
Last seen 06/04/18

## 2018-11-28 DIAGNOSIS — A419 Sepsis, unspecified organism: Secondary | ICD-10-CM | POA: Diagnosis not present

## 2018-11-28 DIAGNOSIS — I5022 Chronic systolic (congestive) heart failure: Secondary | ICD-10-CM | POA: Diagnosis not present

## 2018-11-28 DIAGNOSIS — Z86718 Personal history of other venous thrombosis and embolism: Secondary | ICD-10-CM | POA: Diagnosis not present

## 2018-11-28 DIAGNOSIS — Z7952 Long term (current) use of systemic steroids: Secondary | ICD-10-CM | POA: Diagnosis not present

## 2018-11-28 DIAGNOSIS — K219 Gastro-esophageal reflux disease without esophagitis: Secondary | ICD-10-CM | POA: Diagnosis not present

## 2018-11-28 DIAGNOSIS — Z7901 Long term (current) use of anticoagulants: Secondary | ICD-10-CM | POA: Diagnosis not present

## 2018-11-28 DIAGNOSIS — N390 Urinary tract infection, site not specified: Secondary | ICD-10-CM | POA: Diagnosis not present

## 2018-11-28 DIAGNOSIS — B9620 Unspecified Escherichia coli [E. coli] as the cause of diseases classified elsewhere: Secondary | ICD-10-CM | POA: Diagnosis not present

## 2018-11-28 DIAGNOSIS — I482 Chronic atrial fibrillation, unspecified: Secondary | ICD-10-CM | POA: Diagnosis not present

## 2018-11-28 DIAGNOSIS — J449 Chronic obstructive pulmonary disease, unspecified: Secondary | ICD-10-CM | POA: Diagnosis not present

## 2018-11-28 NOTE — Telephone Encounter (Signed)
Authorize 30 days only. Then contact the patient letting them know that they will need an appointment before any further prescriptions can be sent in. 

## 2018-12-01 ENCOUNTER — Other Ambulatory Visit: Payer: Self-pay | Admitting: Family Medicine

## 2018-12-01 DIAGNOSIS — Z7901 Long term (current) use of anticoagulants: Secondary | ICD-10-CM | POA: Diagnosis not present

## 2018-12-01 DIAGNOSIS — B9620 Unspecified Escherichia coli [E. coli] as the cause of diseases classified elsewhere: Secondary | ICD-10-CM | POA: Diagnosis not present

## 2018-12-01 DIAGNOSIS — Z86718 Personal history of other venous thrombosis and embolism: Secondary | ICD-10-CM | POA: Diagnosis not present

## 2018-12-01 DIAGNOSIS — I5022 Chronic systolic (congestive) heart failure: Secondary | ICD-10-CM | POA: Diagnosis not present

## 2018-12-01 DIAGNOSIS — N390 Urinary tract infection, site not specified: Secondary | ICD-10-CM | POA: Diagnosis not present

## 2018-12-01 DIAGNOSIS — K219 Gastro-esophageal reflux disease without esophagitis: Secondary | ICD-10-CM | POA: Diagnosis not present

## 2018-12-01 DIAGNOSIS — J449 Chronic obstructive pulmonary disease, unspecified: Secondary | ICD-10-CM | POA: Diagnosis not present

## 2018-12-01 DIAGNOSIS — A419 Sepsis, unspecified organism: Secondary | ICD-10-CM | POA: Diagnosis not present

## 2018-12-01 DIAGNOSIS — Z7952 Long term (current) use of systemic steroids: Secondary | ICD-10-CM | POA: Diagnosis not present

## 2018-12-01 DIAGNOSIS — I482 Chronic atrial fibrillation, unspecified: Secondary | ICD-10-CM | POA: Diagnosis not present

## 2018-12-02 DIAGNOSIS — Z7901 Long term (current) use of anticoagulants: Secondary | ICD-10-CM | POA: Diagnosis not present

## 2018-12-02 DIAGNOSIS — N390 Urinary tract infection, site not specified: Secondary | ICD-10-CM | POA: Diagnosis not present

## 2018-12-02 DIAGNOSIS — A419 Sepsis, unspecified organism: Secondary | ICD-10-CM | POA: Diagnosis not present

## 2018-12-02 DIAGNOSIS — K219 Gastro-esophageal reflux disease without esophagitis: Secondary | ICD-10-CM | POA: Diagnosis not present

## 2018-12-02 DIAGNOSIS — I5022 Chronic systolic (congestive) heart failure: Secondary | ICD-10-CM | POA: Diagnosis not present

## 2018-12-02 DIAGNOSIS — I482 Chronic atrial fibrillation, unspecified: Secondary | ICD-10-CM | POA: Diagnosis not present

## 2018-12-02 DIAGNOSIS — J449 Chronic obstructive pulmonary disease, unspecified: Secondary | ICD-10-CM | POA: Diagnosis not present

## 2018-12-02 DIAGNOSIS — Z86718 Personal history of other venous thrombosis and embolism: Secondary | ICD-10-CM | POA: Diagnosis not present

## 2018-12-02 DIAGNOSIS — Z7952 Long term (current) use of systemic steroids: Secondary | ICD-10-CM | POA: Diagnosis not present

## 2018-12-02 DIAGNOSIS — B9620 Unspecified Escherichia coli [E. coli] as the cause of diseases classified elsewhere: Secondary | ICD-10-CM | POA: Diagnosis not present

## 2018-12-02 NOTE — Telephone Encounter (Signed)
Last seen 06/04/18

## 2018-12-03 ENCOUNTER — Ambulatory Visit: Payer: Medicare Other | Admitting: Family Medicine

## 2018-12-03 NOTE — Progress Notes (Deleted)
Subjective: CC: hospital follow up PCP: Claretta Fraise, MD HYQ:MVHQION CHANEE HENRICKSON is a 85 y.o. female presenting to clinic today for:  1. Hospital follow up    ROS: Per HPI  Allergies  Allergen Reactions  . Diltiazem Anxiety and Other (See Comments)    Sleep disturbance  . Iohexol      Desc: THROAT SWELLING-NOTED IN CHART AFTER IVC PLACEMENT-ARS 12-02-05   . Latex Other (See Comments)    unknown  . Penicillins Swelling    Patient was given zosyn this admission and tolerated. After d/w MD and pt, she had swelling in arm only, so possible just infiltration and not allergic. ABx changed to Cefazolin and is tolerating  . Shellfish Allergy Other (See Comments)    Reaction unknown   Past Medical History:  Diagnosis Date  . Cataract   . COPD (chronic obstructive pulmonary disease) ([place])   . Depression   . DVT (deep venous thrombosis) ([place])   . Gastric erosion   . GERD (gastroesophageal reflux disease)   . Hypertension   . Insomnia   . Ischemic colitis ([place])   . Osteoarthritis   . Parotid mass 2016  . 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  acetaminophen (TYLENOL) 500 MG tablet, Take 500-1,000 mg by mouth every 6 (six) hours as needed for mild pain or moderate pain. , Disp: , Rfl:  .  albuterol (PROVENTIL) (2.5 MG/3ML) 0.083% nebulizer solution, Take 3 mLs (2.5 mg total) by nebulization every 2 (two) hours as needed for wheezing or shortness of breath., Disp: 75 mL, Rfl: 12 .  amitriptyline (ELAVIL) 75 MG tablet, Take 1 tablet (75 mg total) by mouth at bedtime., Disp: 90 tablet, Rfl: 1 .  Calcium Carbonate-Vitamin D (CALCIUM-VITAMIN D) 500-200 MG-UNIT tablet, Take 1 tablet by mouth every morning., Disp: , Rfl:  .  carvedilol (COREG) 3.125 MG tablet, Take 1 tablet (3.125 mg total) by mouth 2 (two) times daily with a meal for 30 days., Disp: 60 tablet, Rfl: 0 .  dextromethorphan (DELSYM) 30 MG/5ML liquid, Take 5  mLs (30 mg total) by mouth 2 (two) times daily as needed for cough., Disp: 89 mL, Rfl: 0 .  donepezil (ARICEPT) 10 MG tablet, Take 1 tablet (10 mg total) by mouth at bedtime., Disp: 90 tablet, Rfl: 0 .  ELIQUIS 5 MG TABS tablet, TAKE (1) TABLET TWICE A DAY., Disp: 60 tablet, Rfl: 0 .  furosemide (LASIX) 40 MG tablet, Take 1 tablet (40 mg total) by mouth daily., Disp: 60 tablet, Rfl: 5 .  gabapentin (NEURONTIN) 300 MG capsule, TAKE (1) CAPSULE TWICE DAILY. (Patient taking differently: Take 300 mg by mouth 2 (two) times daily. ), Disp: 180 capsule, Rfl: 1 .  levocetirizine (XYZAL) 5 MG tablet, Take 1 tablet (5 mg total) by mouth at bedtime., Disp: 90 tablet, Rfl: 3 .  nystatin (MYCOSTATIN/NYSTOP) powder, Apply topically 3 (three) times daily., Disp: 15 g, Rfl: 0 .  omeprazole (PRILOSEC) 20 MG capsule, TAKE 2 CAPSULES DAILY AS NEEDED FOR REFLUX (Patient taking differently: Take 20 mg by mouth 2 (two) times daily before a meal. ), Disp: 180 capsule, Rfl: 1 .  oxyCODONE-acetaminophen (PERCOCET) 10-325 MG tablet, Take 1 tablet by mouth every 8 (eight) hours as needed., Disp: , Rfl:  .  OXYGEN, Inhale into the lungs at bedtime., Disp: , Rfl:  .  potassium chloride SA (K-DUR,KLOR-CON) 20 MEQ tablet, Take 1 tablet (20 mEq total) by mouth  daily for 30 days., Disp: 30 tablet, Rfl: 0 .  predniSONE (DELTASONE) 20 MG tablet, Take 2 PO QAM x5 days, Disp: 10 tablet, Rfl: 0 .  rOPINIRole (REQUIP) 1 MG tablet, TAKE 1 OR 2 TABLETS AT BEDTIME, Disp: 180 tablet, Rfl: 0 .  Vitamin D-Vitamin K (VITAMIN K2-VITAMIN D3 PO), Take 2 capsules by mouth every morning., Disp: , Rfl:   Current Facility-Administered Medications:  .  cyanocobalamin ((VITAMIN B-12)) injection 1,000 mcg, 1,000 mcg, Intramuscular, Q30 days, Claretta Fraise, MD, 1,000 mcg at 04/24/18 1154 Social History   Socioeconomic History  . Marital status: Widowed    Spouse name: Not on file  . Number of children: Not on file  . Years of education: Not on file   . Highest education level: Not on file  Occupational History  . Not on file  Social Needs  . Financial resource strain: Not on file  . Food insecurity:    Worry: Not on file    Inability: Not on file  . Transportation needs:    Medical: Not on file    Non-medical: Not on file  Tobacco Use  . Smoking status: Never Smoker  . Smokeless tobacco: Never Used  Substance and Sexual Activity  . Alcohol use: No  . Drug use: No  . Sexual activity: Never    Birth control/protection: Surgical, Post-menopausal  Lifestyle  . Physical activity:    Days per week: Not on file    Minutes per session: Not on file  . Stress: Not on file  Relationships  . Social connections:    Talks on phone: Not on file    Gets together: Not on file    Attends religious service: Not on file    Active member of club or organization: Not on file    Attends meetings of clubs or organizations: Not on file    Relationship status: Not on file  . Intimate partner violence:    Fear of current or ex partner: Not on file    Emotionally abused: Not on file    Physically abused: Not on file    Forced sexual activity: Not on file  Other Topics Concern  . Not on file  Social History Narrative   Lives home alone. Neighbor and daughter check in on patient    Family History  Problem Relation Age of Onset  . Rheum arthritis Mother   . Ulcers Mother        on foot  . Ulcers Father   . Cancer Sister        uterine    Objective: Office vital signs reviewed. There were no vitals taken for this visit.  Physical Examination:  General: Awake, alert, *** nourished, No acute distress HEENT: Normal    Neck: No masses palpated. No lymphadenopathy    Ears: Tympanic membranes intact, normal light reflex, no erythema, no bulging    Eyes: PERRLA, extraocular membranes intact, sclera ***    Nose: nasal turbinates moist, *** nasal discharge    Throat: moist mucus membranes, no erythema, *** tonsillar exudate.  Airway is  patent Cardio: regular rate and rhythm, S1S2 heard, no murmurs appreciated Pulm: clear to auscultation bilaterally, no wheezes, rhonchi or rales; normal work of breathing on room air GI: soft, non-tender, non-distended, bowel sounds present x4, no hepatomegaly, no splenomegaly, no masses GU: external vaginal tissue ***, cervix ***, *** punctate lesions on cervix appreciated, *** discharge from cervical os, *** bleeding, *** cervical motion tenderness, *** abdominal/ adnexal masses  Extremities: warm, well perfused, No edema, cyanosis or clubbing; +*** pulses bilaterally MSK: *** gait and *** station Skin: dry; intact; no rashes or lesions Neuro: *** Strength and light touch sensation grossly intact, *** DTRs ***/4  Assessment/ Plan: 85 y.o. female   ***  No orders of the defined types were placed in this encounter.  No orders of the defined types were placed in this encounter.   Today's visit is for Transitional Care Management.  The patient was discharged from [facility] on 11/24/2018 with a primary diagnosis of sepsis due to urinary tract infection.   Contact with the patient and/or caregiver, by a clinical staff member, was made on 11/26/2018 and was documented as a telephone encounter within the EMR.  Through chart review and discussion with the patient I have determined that management of their condition is of moderate complexity.    Janora Norlander, DO [place] [phone]

## 2018-12-04 DIAGNOSIS — B9620 Unspecified Escherichia coli [E. coli] as the cause of diseases classified elsewhere: Secondary | ICD-10-CM | POA: Diagnosis not present

## 2018-12-04 DIAGNOSIS — A419 Sepsis, unspecified organism: Secondary | ICD-10-CM | POA: Diagnosis not present

## 2018-12-04 DIAGNOSIS — Z7952 Long term (current) use of systemic steroids: Secondary | ICD-10-CM | POA: Diagnosis not present

## 2018-12-04 DIAGNOSIS — J449 Chronic obstructive pulmonary disease, unspecified: Secondary | ICD-10-CM | POA: Diagnosis not present

## 2018-12-04 DIAGNOSIS — Z86718 Personal history of other venous thrombosis and embolism: Secondary | ICD-10-CM | POA: Diagnosis not present

## 2018-12-04 DIAGNOSIS — I482 Chronic atrial fibrillation, unspecified: Secondary | ICD-10-CM | POA: Diagnosis not present

## 2018-12-04 DIAGNOSIS — I5022 Chronic systolic (congestive) heart failure: Secondary | ICD-10-CM | POA: Diagnosis not present

## 2018-12-04 DIAGNOSIS — K219 Gastro-esophageal reflux disease without esophagitis: Secondary | ICD-10-CM | POA: Diagnosis not present

## 2018-12-04 DIAGNOSIS — Z7901 Long term (current) use of anticoagulants: Secondary | ICD-10-CM | POA: Diagnosis not present

## 2018-12-04 DIAGNOSIS — N390 Urinary tract infection, site not specified: Secondary | ICD-10-CM | POA: Diagnosis not present

## 2018-12-05 ENCOUNTER — Ambulatory Visit (INDEPENDENT_AMBULATORY_CARE_PROVIDER_SITE_OTHER): Payer: Medicare Other

## 2018-12-05 ENCOUNTER — Other Ambulatory Visit: Payer: Self-pay

## 2018-12-05 DIAGNOSIS — A419 Sepsis, unspecified organism: Secondary | ICD-10-CM | POA: Diagnosis not present

## 2018-12-05 DIAGNOSIS — B9620 Unspecified Escherichia coli [E. coli] as the cause of diseases classified elsewhere: Secondary | ICD-10-CM

## 2018-12-05 DIAGNOSIS — I482 Chronic atrial fibrillation, unspecified: Secondary | ICD-10-CM

## 2018-12-05 DIAGNOSIS — J449 Chronic obstructive pulmonary disease, unspecified: Secondary | ICD-10-CM | POA: Diagnosis not present

## 2018-12-05 DIAGNOSIS — Z7901 Long term (current) use of anticoagulants: Secondary | ICD-10-CM | POA: Diagnosis not present

## 2018-12-05 DIAGNOSIS — Z86718 Personal history of other venous thrombosis and embolism: Secondary | ICD-10-CM

## 2018-12-05 DIAGNOSIS — K219 Gastro-esophageal reflux disease without esophagitis: Secondary | ICD-10-CM

## 2018-12-05 DIAGNOSIS — Z7952 Long term (current) use of systemic steroids: Secondary | ICD-10-CM | POA: Diagnosis not present

## 2018-12-05 DIAGNOSIS — I5022 Chronic systolic (congestive) heart failure: Secondary | ICD-10-CM

## 2018-12-05 DIAGNOSIS — N390 Urinary tract infection, site not specified: Secondary | ICD-10-CM | POA: Diagnosis not present

## 2018-12-05 DIAGNOSIS — F329 Major depressive disorder, single episode, unspecified: Secondary | ICD-10-CM

## 2018-12-08 DIAGNOSIS — N390 Urinary tract infection, site not specified: Secondary | ICD-10-CM | POA: Diagnosis not present

## 2018-12-08 DIAGNOSIS — Z86718 Personal history of other venous thrombosis and embolism: Secondary | ICD-10-CM | POA: Diagnosis not present

## 2018-12-08 DIAGNOSIS — J449 Chronic obstructive pulmonary disease, unspecified: Secondary | ICD-10-CM | POA: Diagnosis not present

## 2018-12-08 DIAGNOSIS — Z7901 Long term (current) use of anticoagulants: Secondary | ICD-10-CM | POA: Diagnosis not present

## 2018-12-08 DIAGNOSIS — K219 Gastro-esophageal reflux disease without esophagitis: Secondary | ICD-10-CM | POA: Diagnosis not present

## 2018-12-08 DIAGNOSIS — I482 Chronic atrial fibrillation, unspecified: Secondary | ICD-10-CM | POA: Diagnosis not present

## 2018-12-08 DIAGNOSIS — I5022 Chronic systolic (congestive) heart failure: Secondary | ICD-10-CM | POA: Diagnosis not present

## 2018-12-08 DIAGNOSIS — Z7952 Long term (current) use of systemic steroids: Secondary | ICD-10-CM | POA: Diagnosis not present

## 2018-12-08 DIAGNOSIS — B9620 Unspecified Escherichia coli [E. coli] as the cause of diseases classified elsewhere: Secondary | ICD-10-CM | POA: Diagnosis not present

## 2018-12-08 DIAGNOSIS — A419 Sepsis, unspecified organism: Secondary | ICD-10-CM | POA: Diagnosis not present

## 2018-12-10 ENCOUNTER — Other Ambulatory Visit: Payer: Self-pay | Admitting: Family Medicine

## 2018-12-10 DIAGNOSIS — Z86718 Personal history of other venous thrombosis and embolism: Secondary | ICD-10-CM | POA: Diagnosis not present

## 2018-12-10 DIAGNOSIS — K219 Gastro-esophageal reflux disease without esophagitis: Secondary | ICD-10-CM | POA: Diagnosis not present

## 2018-12-10 DIAGNOSIS — I5022 Chronic systolic (congestive) heart failure: Secondary | ICD-10-CM | POA: Diagnosis not present

## 2018-12-10 DIAGNOSIS — N390 Urinary tract infection, site not specified: Secondary | ICD-10-CM | POA: Diagnosis not present

## 2018-12-10 DIAGNOSIS — J449 Chronic obstructive pulmonary disease, unspecified: Secondary | ICD-10-CM | POA: Diagnosis not present

## 2018-12-10 DIAGNOSIS — Z7901 Long term (current) use of anticoagulants: Secondary | ICD-10-CM | POA: Diagnosis not present

## 2018-12-10 DIAGNOSIS — A419 Sepsis, unspecified organism: Secondary | ICD-10-CM | POA: Diagnosis not present

## 2018-12-10 DIAGNOSIS — Z7952 Long term (current) use of systemic steroids: Secondary | ICD-10-CM | POA: Diagnosis not present

## 2018-12-10 DIAGNOSIS — B9620 Unspecified Escherichia coli [E. coli] as the cause of diseases classified elsewhere: Secondary | ICD-10-CM | POA: Diagnosis not present

## 2018-12-10 DIAGNOSIS — I482 Chronic atrial fibrillation, unspecified: Secondary | ICD-10-CM | POA: Diagnosis not present

## 2018-12-11 ENCOUNTER — Other Ambulatory Visit: Payer: Self-pay | Admitting: *Deleted

## 2018-12-11 ENCOUNTER — Telehealth: Payer: Self-pay | Admitting: Family Medicine

## 2018-12-11 MED ORDER — CARVEDILOL 3.125 MG PO TABS: 3.1250 mg | ORAL_TABLET | Freq: Two times a day (BID) | ORAL | 0 refills | Status: DC

## 2018-12-11 MED ORDER — POTASSIUM CHLORIDE CRYS ER 20 MEQ PO TBCR: 20.0000 meq | EXTENDED_RELEASE_TABLET | Freq: Every day | ORAL | 0 refills | Status: DC

## 2018-12-12 ENCOUNTER — Ambulatory Visit: Payer: Medicare Other | Admitting: Family Medicine

## 2018-12-15 ENCOUNTER — Other Ambulatory Visit: Payer: Self-pay | Admitting: Family Medicine

## 2018-12-15 MED ORDER — NYSTATIN 100000 UNIT/GM EX POWD: Freq: Three times a day (TID) | CUTANEOUS | 2 refills | Status: DC

## 2018-12-15 MED ORDER — PREDNISONE 10 MG PO TABS: ORAL_TABLET | ORAL | 0 refills | Status: DC

## 2018-12-15 NOTE — Telephone Encounter (Signed)
Patient aware.

## 2018-12-15 NOTE — Telephone Encounter (Signed)
I sent in the requested prescription 

## 2018-12-23 ENCOUNTER — Other Ambulatory Visit: Payer: Self-pay | Admitting: Family Medicine

## 2018-12-24 DIAGNOSIS — J9601 Acute respiratory failure with hypoxia: Secondary | ICD-10-CM | POA: Diagnosis not present

## 2018-12-24 DIAGNOSIS — L03119 Cellulitis of unspecified part of limb: Secondary | ICD-10-CM | POA: Diagnosis not present

## 2018-12-24 DIAGNOSIS — J449 Chronic obstructive pulmonary disease, unspecified: Secondary | ICD-10-CM | POA: Diagnosis not present

## 2019-01-07 ENCOUNTER — Other Ambulatory Visit: Payer: Self-pay | Admitting: Family Medicine

## 2019-01-13 ENCOUNTER — Other Ambulatory Visit: Payer: Self-pay | Admitting: Family Medicine

## 2019-01-22 DIAGNOSIS — G894 Chronic pain syndrome: Secondary | ICD-10-CM | POA: Diagnosis not present

## 2019-01-22 DIAGNOSIS — Z79891 Long term (current) use of opiate analgesic: Secondary | ICD-10-CM | POA: Diagnosis not present

## 2019-01-23 DIAGNOSIS — L03119 Cellulitis of unspecified part of limb: Secondary | ICD-10-CM | POA: Diagnosis not present

## 2019-01-23 DIAGNOSIS — J9601 Acute respiratory failure with hypoxia: Secondary | ICD-10-CM | POA: Diagnosis not present

## 2019-01-23 DIAGNOSIS — J449 Chronic obstructive pulmonary disease, unspecified: Secondary | ICD-10-CM | POA: Diagnosis not present

## 2019-01-29 ENCOUNTER — Other Ambulatory Visit: Payer: Self-pay

## 2019-01-29 ENCOUNTER — Emergency Department (HOSPITAL_COMMUNITY)
Admission: EM | Admit: 2019-01-29 | Discharge: 2019-01-29 | Disposition: A | Discharge status: Home / Self Care | Payer: Medicare Other | Attending: Emergency Medicine | Admitting: Emergency Medicine

## 2019-01-29 ENCOUNTER — Encounter (HOSPITAL_COMMUNITY): Payer: Self-pay | Admitting: Emergency Medicine

## 2019-01-29 DIAGNOSIS — J449 Chronic obstructive pulmonary disease, unspecified: Secondary | ICD-10-CM | POA: Diagnosis not present

## 2019-01-29 DIAGNOSIS — Z79899 Other long term (current) drug therapy: Secondary | ICD-10-CM | POA: Diagnosis not present

## 2019-01-29 DIAGNOSIS — K5900 Constipation, unspecified: Secondary | ICD-10-CM | POA: Insufficient documentation

## 2019-01-29 DIAGNOSIS — I10 Essential (primary) hypertension: Secondary | ICD-10-CM | POA: Insufficient documentation

## 2019-01-29 DIAGNOSIS — K6289 Other specified diseases of anus and rectum: Secondary | ICD-10-CM | POA: Diagnosis not present

## 2019-01-29 DIAGNOSIS — Z7401 Bed confinement status: Secondary | ICD-10-CM | POA: Diagnosis not present

## 2019-01-29 DIAGNOSIS — M549 Dorsalgia, unspecified: Secondary | ICD-10-CM | POA: Diagnosis not present

## 2019-01-29 MED ORDER — OXYCODONE-ACETAMINOPHEN 5-325 MG PO TABS
2.0000 | ORAL_TABLET | Freq: Once | ORAL | Status: AC
  Administered 2019-01-29: 2 via ORAL
  Filled 2019-01-29: qty 2

## 2019-01-29 MED ORDER — FLEET ENEMA 7-19 GM/118ML RE ENEM
1.0000 | ENEMA | Freq: Once | RECTAL | Status: AC
  Administered 2019-01-29: 1 via RECTAL

## 2019-01-29 NOTE — ED Notes (Signed)
Attempted to call pts daughter several times and it goes right to voicemail.

## 2019-01-29 NOTE — ED Triage Notes (Signed)
Pt states that she has not had a bm in while she states that she is on a lot of pain medication.

## 2019-01-29 NOTE — Progress Notes (Signed)
CSW at bedside with pt to discuss potential transportation options. Pt reported that she does not have transportation as 1 daughter has passed and the other is a pt at [facility]. Pt goes into detail and explains that she has a son but he "treats me like an animal". Pt reports that she also does not have any numbers to contact a ride. Pt states that she usually has a neighbor that supports her but she recently has fallen ill.   Pt has a life alert service.  CSW will continue to address discharge barriers.   [place] Transitions of Care  Clinical Social Worker  Ph: [phone]

## 2019-01-29 NOTE — Progress Notes (Signed)
CSW contacted [facility] to schedule transportation for pt. Staff on phone stated that he will have EMS supervisor, Glenna Durand return my call regarding this matter.

## 2019-01-29 NOTE — ED Provider Notes (Signed)
[facility] EMERGENCY DEPARTMENT Provider Note   CSN: [id number] Arrival date & time: 01/29/19  1455    History   Chief Complaint Chief Complaint  Patient presents with  . Constipation    HPI [name] Walker is a 85 y.o. female.  She is here with a complaint of constipation and rectal pain.  She is not clear when she had her last bowel movement.  She is on chronic opiates for her musculoskeletal pain.  She said she tried some milk of magnesia without any improvement.  Denies any abdominal pain fevers chest pain shortness of breath.  Has not had any rectal bleeding.  She is a very poor historian.     The history is provided by the patient.  Constipation  Severity:  Severe Timing:  Intermittent Progression:  Unchanged Chronicity:  Recurrent Context: narcotics   Stool description:  Pellet like Relieved by:  Nothing Worsened by:  Nothing Ineffective treatments:  Laxatives Associated symptoms: back pain (chronic)   Associated symptoms: no abdominal pain, no diarrhea, no dysuria, no fever, no nausea and no vomiting     Past Medical History:  Diagnosis Date  . Cataract   . COPD (chronic obstructive pulmonary disease) ([place])   . Depression   . DVT (deep venous thrombosis) ([place])   . Gastric erosion   . GERD (gastroesophageal reflux disease)   . Hypertension   . Insomnia   . Ischemic colitis ([place])   . Osteoarthritis   . Parotid mass 2016  . Primary squamous cell carcinoma of parotid gland ([place]) 07/25/2015    Patient Active Problem List   Diagnosis Date Noted  . Pressure injury of skin 11/22/2018  . Bacteremia due to Gram-negative bacteria 11/20/2018  . AKI (acute kidney injury) ([place]) 11/20/2018  . Sepsis due to urinary tract infection ([place]) 11/19/2018  . Primary osteoarthritis of right shoulder 07/10/2018  . Primary osteoarthritis involving multiple joints 03/03/2018  . Imbalance 03/03/2018  . Frequent falls 03/03/2018  . Supplemental oxygen dependent 07/14/2016   . C5 vertebral fracture ([place]) 05/25/2016  . Hypokalemia 05/23/2016  . B12 deficiency 08/24/2015  . Insomnia 08/15/2015  . Primary squamous cell carcinoma of parotid gland ([place]) 07/25/2015  . Atrial fibrillation, chronic 07/22/2015  . Atrial fibrillation with RVR ([place]) 07/13/2015  . H/O parotidectomy 05/17/2015  . Osteopenia 05/13/2015  . Weakness 07/15/2013  . Anemia 01/06/2009  . Depression, recurrent ([place]) 01/06/2009  . Essential hypertension 01/06/2009  . COPD (chronic obstructive pulmonary disease) ([place]) 01/06/2009  . GERD 01/06/2009  . IRRITABLE BOWEL SYNDROME 01/06/2009    Past Surgical History:  Procedure Laterality Date  . ABDOMINAL HYSTERECTOMY    . BACK SURGERY    . Cataracts    . CHOLECYSTECTOMY    . COLONOSCOPY    . ESOPHAGOGASTRODUODENOSCOPY  10/17/2007   erosion   . KNEE ARTHROPLASTY     bilateral  . PAROTIDECTOMY Left 05/17/2015   Procedure: PAROTIDECTOMY- LEFT TOTAL;  Surgeon: Leta Baptist, MD;  Location: [facility];  Service: ENT;  Laterality: Left;  . TOTAL HIP ARTHROPLASTY     bilateral     OB History   No obstetric history on file.      Home Medications    Prior to Admission medications   Medication Sig Start Date End Date Taking? Authorizing Provider  acetaminophen (TYLENOL) 500 MG tablet Take 500-1,000 mg by mouth every 6 (six) hours as needed for mild pain or moderate pain.     [provider]  albuterol (  PROVENTIL) (2.5 MG/3ML) 0.083% nebulizer solution Take 3 mLs (2.5 mg total) by nebulization every 2 (two) hours as needed for wheezing or shortness of breath. 07/31/15   Isaac Bliss, Rayford Halsted, MD  amitriptyline (ELAVIL) 75 MG tablet Take 1 tablet (75 mg total) by mouth at bedtime. 12/23/18   Claretta Fraise, MD  Calcium Carbonate-Vitamin D (CALCIUM-VITAMIN D) 500-200 MG-UNIT tablet Take 1 tablet by mouth every morning.    [provider]  carvedilol (COREG) 3.125 MG tablet TAKE  (1)  TABLET TWICE A DAY WITH MEALS  (BREAKFAST AND SUPPER) 01/14/19   Claretta Fraise, MD  dextromethorphan (DELSYM) 30 MG/5ML liquid Take 5 mLs (30 mg total) by mouth 2 (two) times daily as needed for cough. 11/24/18   Johnson, Clanford L, MD  donepezil (ARICEPT) 10 MG tablet Take 1 tablet (10 mg total) by mouth at bedtime. 11/28/18   Claretta Fraise, MD  ELIQUIS 5 MG TABS tablet TAKE (1) TABLET TWICE A DAY. 01/14/19   Claretta Fraise, MD  furosemide (LASIX) 40 MG tablet Take 1 tablet (40 mg total) by mouth daily. 11/25/18   Johnson, Clanford L, MD  gabapentin (NEURONTIN) 300 MG capsule TAKE (1) CAPSULE TWICE DAILY. 12/23/18   Claretta Fraise, MD  levocetirizine (XYZAL) 5 MG tablet Take 1 tablet (5 mg total) by mouth at bedtime. 06/04/18   Claretta Fraise, MD  nystatin (MYCOSTATIN/NYSTOP) powder Apply topically 3 (three) times daily. 12/15/18   Claretta Fraise, MD  omeprazole (PRILOSEC) 20 MG capsule TAKE 2 CAPSULES DAILY AS NEEDED FOR REFLUX 12/10/18   Claretta Fraise, MD  oxyCODONE-acetaminophen (PERCOCET) 10-325 MG tablet Take 1 tablet by mouth every 8 (eight) hours as needed. 11/04/18   [provider]  OXYGEN Inhale into the lungs at bedtime.    [provider]  potassium chloride (K-DUR) 10 MEQ tablet Take 2 tablets (20 mEq total) by mouth daily. (Needs to be seen before next refill) 01/07/19   Claretta Fraise, MD  predniSONE (DELTASONE) 10 MG tablet Take three a day for three days then two a day for three days then one a day for three days, then DC 12/15/18   Claretta Fraise, MD  rOPINIRole (REQUIP) 1 MG tablet TAKE 1 OR 2 TABLETS AT BEDTIME 12/02/18   Claretta Fraise, MD  Vitamin D-Vitamin K (VITAMIN K2-VITAMIN D3 PO) Take 2 capsules by mouth every morning.    [provider]    Family History Family History  Problem Relation Age of Onset  . Rheum arthritis Mother   . Ulcers Mother        on foot  . Ulcers Father   . Cancer Sister        uterine    Social History Social History   Tobacco Use  . Smoking status:  Never Smoker  . Smokeless tobacco: Never Used  Substance Use Topics  . Alcohol use: No  . Drug use: No     Allergies   Diltiazem; Iohexol; Latex; Penicillins; and Shellfish allergy   Review of Systems Review of Systems  Constitutional: Negative for fever.  HENT: Negative for sore throat.   Eyes: Negative for visual disturbance.  Respiratory: Negative for shortness of breath.   Cardiovascular: Negative for chest pain.  Gastrointestinal: Positive for constipation. Negative for abdominal pain, diarrhea, nausea and vomiting.  Genitourinary: Negative for dysuria.  Musculoskeletal: Positive for back pain (chronic).  Skin: Negative for rash.  Neurological: Negative for headaches.     Physical Exam Updated Vital Signs BP 119/86 (BP Location:  Right Arm)   Pulse (!) 126   Temp 97.8 F (36.6 C) (Oral)   Resp 14   Ht 4\' 9"  (1.448 m)   Wt 73.9 kg   SpO2 95%   BMI 35.27 kg/m   Physical Exam Vitals signs and nursing note reviewed.  Constitutional:      General: She is not in acute distress.    Appearance: She is well-developed.  HENT:     Head: Normocephalic and atraumatic.  Eyes:     Conjunctiva/sclera: Conjunctivae normal.  Neck:     Musculoskeletal: Neck supple.  Cardiovascular:     Rate and Rhythm: Regular rhythm. Tachycardia present.     Heart sounds: No murmur.  Pulmonary:     Effort: Pulmonary effort is normal. No respiratory distress.     Breath sounds: Normal breath sounds.  Abdominal:     Palpations: Abdomen is soft.     Tenderness: There is no abdominal tenderness. There is no guarding or rebound.  Genitourinary:    Comments: Rectal exam positive hard stool in vault.  No gross bleeding. Musculoskeletal:     Right lower leg: No edema.     Left lower leg: No edema.  Skin:    General: Skin is warm and dry.     Capillary Refill: Capillary refill takes less than 2 seconds.  Neurological:     General: No focal deficit present.     Mental Status: She is  alert. Mental status is at baseline.      ED Treatments / Results  Labs (all labs ordered are listed, but only abnormal results are displayed) Labs Reviewed - No data to display  EKG None  Radiology No results found.  Procedures Fecal disimpaction Date/Time: 01/29/2019 4:52 PM Performed by: Hayden Rasmussen, MD Authorized by: Hayden Rasmussen, MD  Consent: Verbal consent obtained. Consent given by: patient Patient understanding: patient states understanding of the procedure being performed Patient identity confirmed: verbally with patient Local anesthesia used: no  Anesthesia: Local anesthesia used: no  Sedation: Patient sedated: no  Patient tolerance: Patient tolerated the procedure well with no immediate complications Comments: Large amount of hard brown stool removed    (including critical care time)  Medications Ordered in ED Medications  sodium phosphate (FLEET) 7-19 GM/118ML enema 1 enema (has no administration in time range)     Initial Impression / Assessment and Plan / ED Course  I have reviewed the triage vital signs and the nursing notes.  Pertinent labs & imaging results that were available during my care of the patient were reviewed by me and considered in my medical decision making (see chart for details).  Clinical Course as of Jan 29 1301  Thu Jan 29, 2019  1841 Patient had improvement in her symptoms after rectal disimpaction.  We gave her an enema but she has not had any further output.  She feels well enough to go home.  The nurse reached out to her family and they are trying to figure out a way to come get her.   [MB]  2207 The nurses are still working on try to get the patient home.  She is asking for some pain medicine as she has had none since she has been here and is on chronic narcotics.   [MB]    Clinical Course User Index [MB] Hayden Rasmussen, MD        Final Clinical Impressions(s) / ED Diagnoses   Final diagnoses:   Constipation, unspecified constipation type  Rectal pain    ED Discharge Orders    None       Hayden Rasmussen, MD 01/30/19 [phone]

## 2019-01-29 NOTE — Progress Notes (Signed)
CSW aware of complicated discharge and barrier involving transportation. CSW attempting to contact pt's emergency contact at this time. Debraann Livingstone Ph: [phone]

## 2019-01-29 NOTE — Progress Notes (Signed)
CSW left voicemail for Pt's daughter as her phone goes straight to voicemail. CSW left voice message requesting a call back and reiterated the risk of her mother being exposed to virus in the ED.   Will continue to follow Pt to assist with transportation home.

## 2019-01-29 NOTE — ED Notes (Signed)
Unable to get patient to sign

## 2019-01-29 NOTE — Discharge Instructions (Addendum)
You were seen in the emergency department for rectal pain and constipation.  We are able to remove some stool and improve your symptoms.  Please contact your doctor for follow-up.  Return if any concerns.

## 2019-01-29 NOTE — Progress Notes (Signed)
CSW received call back from EMS supervisor Glenna Durand. CSW explained barriers to have pt transported home with family. CSW informed Elberta Fortis that the pt reported that her last living daughter was a pt at [facility] and is unable to pick her up. Elberta Fortis requested that this information be confirmed in some way in order for her to possible receive a ride home via EMS. Elberta Fortis provided direct number. Ph: [phone]  CSW will continue to follow up regarding this matter.   [place] Transitions of Care  Clinical Social Worker  Ph: [phone]

## 2019-01-29 NOTE — ED Notes (Signed)
Ph call to daughter regarding transportation for pt, advised she is ready for discharge and if EMS could transport pt home pt would be responsible for bill and if no transportation can be arranged will need to make a social work consult should no transportation be provided for pt

## 2019-01-29 NOTE — Progress Notes (Signed)
CSW spoke with EMS supervisor Glenna Durand, to discuss transportation arrangements for pt. Mervyn Skeeters suggested [employer].  CSW contacted [place] transportation company and was informed that they could provide transportation at the expense of the hospital. [place] consulted with hospital [facility] regarding this matter.  CSW followed back up with EMS supervisor and he agreed to have EMS transportation pick up patient and assist her home.   CSW will update nursing staff regarding this information.   [place] Transitions of Care  Clinical Social Worker  Ph: [phone]

## 2019-01-30 ENCOUNTER — Encounter: Payer: Self-pay | Admitting: *Deleted

## 2019-01-30 ENCOUNTER — Telehealth: Payer: Self-pay | Admitting: *Deleted

## 2019-01-30 NOTE — Progress Notes (Signed)
Per earlier note from [facility] nurse: I had sent a message to [place], care mgmt nurse and to [name], social worker here At [facility].  Scott called my back and states that he has looked through pt notes and decided to call APS and that he spoke with [place] there. Together they discussed the pts needs and decided to get a Nurse, learning disability on board for this pt. She has been accepted by Gwenlyn Perking at [facility] and she will schedule a home visit.

## 2019-01-30 NOTE — Telephone Encounter (Signed)
[name] Walker from [facility] calls and states that she called pt after recent ER visit. She states that she is in need of help.  She can not bath herself, she cant do mych for herself, no family, one friend usually helps with buying groceries for her, but she is not helping her currently.  [name] Walker aware that we do some case mgmt and social work and aware that we will get them involved in her care.

## 2019-02-02 ENCOUNTER — Other Ambulatory Visit: Payer: Self-pay | Admitting: Family Medicine

## 2019-02-03 NOTE — Telephone Encounter (Signed)
Apt scheduled.  

## 2019-02-03 NOTE — Telephone Encounter (Signed)
Stacks. NTBS 30 days given 01/07/19

## 2019-02-04 ENCOUNTER — Ambulatory Visit: Payer: Self-pay | Admitting: *Deleted

## 2019-02-04 NOTE — Chronic Care Management (AMB) (Addendum)
  Care Management   Follow Up Note  02/04/2019 Name: [name] Walker MRN: [id number] DOB: November 16, 1924  [name] Walker was discharged from [facility] ED on 01/29/2019 after being transported by EMS for rectal pain. At the time her family was unavailable to assist with transfer back to home so EMS provided that as well.   Arlyss Gandy with [facility] engaged patient by telephone after her ED visit and she was concerned about her safety and wellbeing because of her inability to care for herself independently and the lack of available caregivers.   Arlyss Gandy relayed her concerns to our office and our care management team go involved. APS [phone]) was contacted. I made a follow up telephone call to APS this morning but was unable to speak with them. I did leave a message requesting a return call with an update on her status. See previous telephone notes regarding this.   Follow up plan: CM team will await response from APS and will reach out to them again over the next 48 hours if there is no return call.    Chong Sicilian, RN-BC, BSN Nurse Care Manager [facility] [phone]   *Patient is not enrolled in CCM Services*

## 2019-02-05 ENCOUNTER — Emergency Department (HOSPITAL_COMMUNITY): Payer: Medicare Other

## 2019-02-05 ENCOUNTER — Other Ambulatory Visit: Payer: Self-pay

## 2019-02-05 ENCOUNTER — Encounter (HOSPITAL_COMMUNITY): Payer: Self-pay | Admitting: Emergency Medicine

## 2019-02-05 ENCOUNTER — Emergency Department (HOSPITAL_COMMUNITY)
Admission: EM | Admit: 2019-02-05 | Discharge: 2019-02-06 | Disposition: A | Discharge status: Home / Self Care | Payer: Medicare Other | Attending: Emergency Medicine | Admitting: Emergency Medicine

## 2019-02-05 ENCOUNTER — Ambulatory Visit: Payer: Medicare Other | Admitting: *Deleted

## 2019-02-05 DIAGNOSIS — M79641 Pain in right hand: Secondary | ICD-10-CM | POA: Diagnosis not present

## 2019-02-05 DIAGNOSIS — Z79899 Other long term (current) drug therapy: Secondary | ICD-10-CM | POA: Insufficient documentation

## 2019-02-05 DIAGNOSIS — Z1159 Encounter for screening for other viral diseases: Secondary | ICD-10-CM | POA: Diagnosis not present

## 2019-02-05 DIAGNOSIS — R609 Edema, unspecified: Secondary | ICD-10-CM | POA: Diagnosis not present

## 2019-02-05 DIAGNOSIS — I10 Essential (primary) hypertension: Secondary | ICD-10-CM | POA: Diagnosis not present

## 2019-02-05 DIAGNOSIS — M79672 Pain in left foot: Secondary | ICD-10-CM | POA: Diagnosis not present

## 2019-02-05 DIAGNOSIS — M79671 Pain in right foot: Secondary | ICD-10-CM | POA: Diagnosis not present

## 2019-02-05 DIAGNOSIS — R269 Unspecified abnormalities of gait and mobility: Secondary | ICD-10-CM | POA: Insufficient documentation

## 2019-02-05 DIAGNOSIS — Z03818 Encounter for observation for suspected exposure to other biological agents ruled out: Secondary | ICD-10-CM | POA: Diagnosis not present

## 2019-02-05 DIAGNOSIS — R52 Pain, unspecified: Secondary | ICD-10-CM | POA: Diagnosis not present

## 2019-02-05 DIAGNOSIS — J449 Chronic obstructive pulmonary disease, unspecified: Secondary | ICD-10-CM | POA: Diagnosis not present

## 2019-02-05 DIAGNOSIS — M79631 Pain in right forearm: Secondary | ICD-10-CM | POA: Diagnosis not present

## 2019-02-05 DIAGNOSIS — M19041 Primary osteoarthritis, right hand: Secondary | ICD-10-CM | POA: Diagnosis not present

## 2019-02-05 DIAGNOSIS — R0902 Hypoxemia: Secondary | ICD-10-CM | POA: Diagnosis not present

## 2019-02-05 DIAGNOSIS — J9811 Atelectasis: Secondary | ICD-10-CM | POA: Diagnosis not present

## 2019-02-05 DIAGNOSIS — I517 Cardiomegaly: Secondary | ICD-10-CM | POA: Diagnosis not present

## 2019-02-05 DIAGNOSIS — I4891 Unspecified atrial fibrillation: Secondary | ICD-10-CM | POA: Insufficient documentation

## 2019-02-05 DIAGNOSIS — M25552 Pain in left hip: Secondary | ICD-10-CM | POA: Diagnosis not present

## 2019-02-05 DIAGNOSIS — M25551 Pain in right hip: Secondary | ICD-10-CM | POA: Diagnosis not present

## 2019-02-05 DIAGNOSIS — S43014A Anterior dislocation of right humerus, initial encounter: Secondary | ICD-10-CM | POA: Diagnosis not present

## 2019-02-05 DIAGNOSIS — I959 Hypotension, unspecified: Secondary | ICD-10-CM | POA: Diagnosis not present

## 2019-02-05 LAB — CBC WITH DIFFERENTIAL/PLATELET
Abs Immature Granulocytes: 0.02 10*3/uL (ref 0.00–0.07)
Basophils Absolute: 0 10*3/uL (ref 0.0–0.1)
Basophils Relative: 0 %
Eosinophils Absolute: 0.2 10*3/uL (ref 0.0–0.5)
Eosinophils Relative: 3 %
HCT: 33.9 % — ABNORMAL LOW (ref 36.0–46.0)
Hemoglobin: 10.9 g/dL — ABNORMAL LOW (ref 12.0–15.0)
Immature Granulocytes: 0 %
Lymphocytes Relative: 11 %
Lymphs Abs: 0.8 10*3/uL (ref 0.7–4.0)
MCH: 30.9 pg (ref 26.0–34.0)
MCHC: 32.2 g/dL (ref 30.0–36.0)
MCV: 96 fL (ref 80.0–100.0)
Monocytes Absolute: 1.4 10*3/uL — ABNORMAL HIGH (ref 0.1–1.0)
Monocytes Relative: 19 %
Neutro Abs: 5.2 10*3/uL (ref 1.7–7.7)
Neutrophils Relative %: 67 %
Platelets: 336 10*3/uL (ref 150–400)
RBC: 3.53 MIL/uL — ABNORMAL LOW (ref 3.87–5.11)
RDW: 14.7 % (ref 11.5–15.5)
WBC: 7.7 10*3/uL (ref 4.0–10.5)
nRBC: 0 % (ref 0.0–0.2)

## 2019-02-05 LAB — URINALYSIS, ROUTINE W REFLEX MICROSCOPIC
Bacteria, UA: NONE SEEN
Bilirubin Urine: NEGATIVE
Glucose, UA: NEGATIVE mg/dL
Hgb urine dipstick: NEGATIVE
Ketones, ur: NEGATIVE mg/dL
Nitrite: NEGATIVE
Protein, ur: NEGATIVE mg/dL
Specific Gravity, Urine: 1.015 (ref 1.005–1.030)
pH: 7 (ref 5.0–8.0)

## 2019-02-05 LAB — COMPREHENSIVE METABOLIC PANEL
ALT: 34 U/L (ref 0–44)
AST: 41 U/L (ref 15–41)
Albumin: 3.2 g/dL — ABNORMAL LOW (ref 3.5–5.0)
Alkaline Phosphatase: 180 U/L — ABNORMAL HIGH (ref 38–126)
Anion gap: 9 (ref 5–15)
BUN: 18 mg/dL (ref 8–23)
CO2: 29 mmol/L (ref 22–32)
Calcium: 9 mg/dL (ref 8.9–10.3)
Chloride: 100 mmol/L (ref 98–111)
Creatinine, Ser: 0.85 mg/dL (ref 0.44–1.00)
GFR calc Af Amer: >60 mL/min (ref >60)
GFR calc non Af Amer: 60 mL/min — ABNORMAL LOW (ref >60)
Glucose, Bld: 127 mg/dL — ABNORMAL HIGH (ref 70–99)
Potassium: 3.7 mmol/L (ref 3.5–5.1)
Sodium: 138 mmol/L (ref 135–145)
Total Bilirubin: 0.7 mg/dL (ref 0.3–1.2)
Total Protein: 6.8 g/dL (ref 6.5–8.1)

## 2019-02-05 MED ORDER — HYDROMORPHONE HCL 1 MG/ML IJ SOLN
1.0000 mg | Freq: Once | INTRAMUSCULAR | Status: AC
  Administered 2019-02-05: 1 mg via INTRAMUSCULAR
  Filled 2019-02-05: qty 1

## 2019-02-05 MED ORDER — BOOST / RESOURCE BREEZE PO LIQD CUSTOM
1.0000 | ORAL | Status: AC
  Administered 2019-02-06: 1 via ORAL
  Filled 2019-02-05: qty 1

## 2019-02-05 NOTE — Chronic Care Management (AMB) (Signed)
  Care Management   Follow Up Note  02/05/2019 Name: [name] Walker MRN: [id number] DOB: November 03, 1924  I spoke with DSS Case Worker, Arlyss Gandy, Wednesday afternoon. She was able to talk with [name] Walker in person at her home on Tuesday, 02/02/19. She was unable to enter the home due to social distancing restrictions because of COVID19 so her assessment and interview was done outside. Inability to assess living conditions does put them at a disadvantage. Arlyss Gandy agreed that [name] Walker needs assistance but [name] Walker told her that a friend had came by and helped her straighten up some and that she had brought some food.   I provided Arlyss Gandy with up to date insurance information. It appears that Medicare A &B are her only coverage. UHC has lapsed. Arlyss Gandy will look into PCS but I believe that is only available for Medicaid recipients. As is CAPS. In home care would likely have to be paid out of pocket. I think she would get the most benefit out of an assisted living or SNF.  DSS to follow patient and will update [place] as needed.   I was going to reach out to [name] Walker today but realized she has gone back to [facility] ED for bilateral lower extremity swelling "for a while" and pain in her right arm since yesterday.  Follow Up Plan I will review her chart and reach out tomorrow if she is discharged.    Chong Sicilian, RN-BC, BSN Nurse Care Manager [facility] [phone]

## 2019-02-05 NOTE — Progress Notes (Signed)
CSW attempting to contact Pt's granddaughter to discuss pt care. Ph: [phone]. CSW left voicemail requesting a call back.

## 2019-02-05 NOTE — Progress Notes (Signed)
Consult request has been received. CSW attempting to follow up at present time  Alieu Finnigan M. Arshia Spellman LCSWA Transitions of Care  Clinical Social Worker  Ph: [phone] 

## 2019-02-05 NOTE — Progress Notes (Signed)
CSW attempted for a second time to contact a family member to assist in coordinating her discharge and to assure a safe discharge until a decision can be made regarding her care. CSW was able to talk with Pt's granddaughters husband. He stated that [name] Walker, pt's granddaughter was at work and would contact her and have her reach out to the ED staff.   [place] [facility]itions of Care  Clinical Social Worker  Ph: [phone]

## 2019-02-05 NOTE — ED Provider Notes (Signed)
[facility] EMERGENCY DEPARTMENT Provider Note   CSN: [id number] Arrival date & time: 02/05/19  1641    History   Chief Complaint Chief Complaint  Patient presents with   Hand Pain   Foot Pain    HPI [name] Walker is a 85 y.o. female.     Patient brought in by EMS.  Patient appears to have a degree of confusion.  Patient with complaint of bilateral hand pain and right forearm and upper extremity pain.  Patient also with complaint of bilateral foot pain.  Does appear to be some swelling to the right hand.  No significant swelling to the feet.  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  Patient is alert but has some confusion seems to have some memory problems.  Patient's EKG here today shows atrial fibrillation with heart rate right around 100.  Patient has EKG from February which showed atrial fibrillation.  So I suspect she has a history of atrial fibrillation.  She is on Coreg.  Do not see any listing of blood thinner.  And she is also on Aricept.  Which confirms my suspicion about some dementia.  Patient had an oxygen saturation upon arrival 84% on room air.  Patient is on oxygen as needed.  She does have oxygen at home.  On 2 L nasal cannula oxygen her oxygen sats are fine.     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    Patient Active Problem List   Diagnosis Date Noted   Pressure injury of skin 11/22/2018   Bacteremia due to Gram-negative bacteria 11/20/2018   AKI (acute  kidney injury) ([place]) 11/20/2018   Sepsis due to urinary tract infection ([place]) 11/19/2018   Primary osteoarthritis of right shoulder 07/10/2018   Primary osteoarthritis involving multiple joints 03/03/2018   Imbalance 03/03/2018   Frequent falls 03/03/2018   Supplemental oxygen dependent 07/14/2016   C5 vertebral fracture ([place]) 05/25/2016   Hypokalemia 05/23/2016   B12 deficiency 08/24/2015   Insomnia 08/15/2015   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   Atrial fibrillation, chronic 07/22/2015   Atrial fibrillation with RVR ([place]) 07/13/2015   H/O parotidectomy 05/17/2015   Osteopenia 05/13/2015   Weakness 07/15/2013   Anemia 01/06/2009   Depression, recurrent ([place]) 01/06/2009   Essential hypertension 01/06/2009   COPD (chronic obstructive pulmonary disease) ([place]) 01/06/2009   GERD 01/06/2009   IRRITABLE BOWEL SYNDROME 01/06/2009    Past Surgical History:  Procedure Laterality Date   ABDOMINAL HYSTERECTOMY     BACK SURGERY     Cataracts     CHOLECYSTECTOMY     COLONOSCOPY     ESOPHAGOGASTRODUODENOSCOPY  10/17/2007   erosion    KNEE ARTHROPLASTY     bilateral   PAROTIDECTOMY Left 05/17/2015   Procedure: PAROTIDECTOMY- LEFT TOTAL;  Surgeon: Leta Baptist, MD;  Location: [place];  Service: ENT;  Laterality: Left;   TOTAL HIP ARTHROPLASTY     bilateral  OB History   No obstetric history on file.      Home Medications    Prior to Admission medications   Medication Sig Start Date End Date Taking? Authorizing Provider  acetaminophen (TYLENOL) 500 MG tablet Take 500-1,000 mg by mouth every 6 (six) hours as needed for mild pain or moderate pain.    Yes [provider]  amitriptyline (ELAVIL) 75 MG tablet Take 1 tablet (75 mg total) by mouth at bedtime. 12/23/18  Yes Stacks, Cletus Gash, MD  Calcium Carbonate-Vitamin D (CALCIUM-VITAMIN D) 500-200 MG-UNIT tablet Take 1 tablet by mouth every morning.    Yes [provider]  carvedilol (COREG) 3.125 MG tablet TAKE  (1)  TABLET TWICE A DAY WITH MEALS (BREAKFAST AND SUPPER) 01/14/19  Yes Claretta Fraise, MD  donepezil (ARICEPT) 10 MG tablet Take 1 tablet (10 mg total) by mouth at bedtime. 11/28/18  Yes Stacks, Cletus Gash, MD  ELIQUIS 5 MG TABS tablet TAKE (1) TABLET TWICE A DAY. 01/14/19  Yes Claretta Fraise, MD  furosemide (LASIX) 40 MG tablet Take 1 tablet (40 mg total) by mouth daily. 11/25/18  Yes Johnson, Clanford L, MD  gabapentin (NEURONTIN) 300 MG capsule TAKE (1) CAPSULE TWICE DAILY. 12/23/18  Yes Stacks, Cletus Gash, MD  levocetirizine (XYZAL) 5 MG tablet Take 1 tablet (5 mg total) by mouth at bedtime. 06/04/18  Yes Stacks, Cletus Gash, MD  morphine (MSIR) 15 MG tablet Take 1 tablet by mouth 3 (three) times daily. 01/28/19  Yes [provider]  nystatin (MYCOSTATIN/NYSTOP) powder Apply topically 3 (three) times daily. 12/15/18  Yes Stacks, Cletus Gash, MD  omeprazole (PRILOSEC) 20 MG capsule TAKE 2 CAPSULES DAILY AS NEEDED FOR REFLUX 12/10/18  Yes Stacks, Cletus Gash, MD  OXYGEN Inhale into the lungs at bedtime.   Yes [provider]  potassium chloride (K-DUR) 10 MEQ tablet TAKE 2 TABLETS DAILY AS DIRECTED 02/03/19  Yes Stacks, Cletus Gash, MD  rOPINIRole (REQUIP) 1 MG tablet TAKE 1 OR 2 TABLETS AT BEDTIME 12/02/18  Yes Stacks, Cletus Gash, MD  verapamil (CALAN-SR) 180 MG CR tablet Take 1 tablet by mouth daily. 12/10/18  Yes [provider]  albuterol (PROVENTIL) (2.5 MG/3ML) 0.083% nebulizer solution Take 3 mLs (2.5 mg total) by nebulization every 2 (two) hours as needed for wheezing or shortness of breath. Patient not taking: Reported on 02/05/2019 07/31/15   Isaac Bliss, Rayford Halsted, MD  dextromethorphan (DELSYM) 30 MG/5ML liquid Take 5 mLs (30 mg total) by mouth 2 (two) times daily as needed for cough. Patient not taking: Reported on 02/05/2019 11/24/18   Murlean Iba, MD  predniSONE (DELTASONE) 10 MG tablet Take three a day for three days then  two a day for three days then one a day for three days, then DC Patient not taking: Reported on 02/05/2019 12/15/18   Claretta Fraise, MD    Family History Family History  Problem Relation Age of Onset   Rheum arthritis Mother    Ulcers Mother        on foot   Ulcers Father    Cancer Sister        uterine    Social History Social History   Tobacco Use   Smoking status: Never Smoker   Smokeless tobacco: Never Used  Substance Use Topics   Alcohol use: No   Drug use: No     Allergies   Diltiazem; Iohexol; Latex; Penicillins; and Shellfish allergy   Review of Systems Review of Systems  Unable to perform ROS: Dementia  Physical Exam Updated Vital Signs BP 125/69    Pulse (!) 58    Temp 98.7 F (37.1 C) (Oral)    Resp (!) 30    Ht 1.448 m (4\' 9" )    Wt 73 kg    SpO2 (!) 81%    BMI 34.83 kg/m   Physical Exam Vitals signs and nursing note reviewed.  Constitutional:      General: She is not in acute distress.    Appearance: Normal appearance. She is well-developed.  HENT:     Head: Normocephalic and atraumatic.  Eyes:     Extraocular Movements: Extraocular movements intact.     Conjunctiva/sclera: Conjunctivae normal.     Pupils: Pupils are equal, round, and reactive to light.  Neck:     Musculoskeletal: Normal range of motion and neck supple.  Cardiovascular:     Rate and Rhythm: Normal rate and regular rhythm.     Heart sounds: Normal heart sounds. No murmur.  Pulmonary:     Effort: Pulmonary effort is normal. No respiratory distress.     Breath sounds: Normal breath sounds.  Abdominal:     General: Bowel sounds are normal.     Palpations: Abdomen is soft.     Tenderness: There is no abdominal tenderness.  Musculoskeletal:     Comments: Patient with swelling to the right hand.  Seems to have some decreased range of motion of the right arm perhaps at the shoulder area.  Radial pulse 1+.  Good cap refill.  Patient also complained of pain to the left  hand.  But no significant swelling there.  Good range of motion of the arm.  Bilateral feet with some tenderness to palpation no obvious deformity.  Good cap refill.  No swelling at the knees.  Some limited range of motion of both hips.  Skin:    General: Skin is warm and dry.     Capillary Refill: Capillary refill takes less than 2 seconds.  Neurological:     General: No focal deficit present.     Mental Status: She is alert.     Comments: No gross focal deficit.  Patient is alert but seems to have a lot of memory issues.      ED Treatments / Results  Labs (all labs ordered are listed, but only abnormal results are displayed) Labs Reviewed  CBC WITH DIFFERENTIAL/PLATELET - Abnormal; Notable for the following components:      Result Value   RBC 3.53 (*)    Hemoglobin 10.9 (*)    HCT 33.9 (*)    Monocytes Absolute 1.4 (*)    All other components within normal limits  COMPREHENSIVE METABOLIC PANEL - Abnormal; Notable for the following components:   Glucose, Bld 127 (*)    Albumin 3.2 (*)    Alkaline Phosphatase 180 (*)    GFR calc non Af Amer 60 (*)    All other components within normal limits  URINALYSIS, ROUTINE W REFLEX MICROSCOPIC - Abnormal; Notable for the following components:   Leukocytes,Ua SMALL (*)    All other components within normal limits  SARS CORONAVIRUS 2 (HOSPITAL ORDER, [place] [facility])    EKG EKG Interpretation  Date/Time:  Thursday Feb 05 2019 18:37:42 EDT Ventricular Rate:  108 PR Interval:    QRS Duration: 114 QT Interval:  377 QTC Calculation: 506 R Axis:   43 Text Interpretation:  Atrial fibrillation Borderline intraventricular conduction delay Abnormal R-wave progression, early transition Borderline T abnormalities, inferior  leads Prolonged QT interval Confirmed by Fredia Sorrow [phone]) on 02/05/2019 6:43:29 PM   Radiology No results found.  Results for orders placed or performed during the hospital encounter of  02/05/19  SARS Coronavirus 2 (CEPHEID - Performed in [place] hospital lab), [facility] Order  Result Value Ref Range   SARS Coronavirus 2 NEGATIVE NEGATIVE  CBC with Differential/Platelet  Result Value Ref Range   WBC 7.7 4.0 - 10.5 K/uL   RBC 3.53 (L) 3.87 - 5.11 MIL/uL   Hemoglobin 10.9 (L) 12.0 - 15.0 g/dL   HCT 33.9 (L) 36.0 - 46.0 %   MCV 96.0 80.0 - 100.0 fL   MCH 30.9 26.0 - 34.0 pg   MCHC 32.2 30.0 - 36.0 g/dL   RDW 14.7 11.5 - 15.5 %   Platelets 336 150 - 400 K/uL   nRBC 0.0 0.0 - 0.2 %   Neutrophils Relative % 67 %   Neutro Abs 5.2 1.7 - 7.7 K/uL   Lymphocytes Relative 11 %   Lymphs Abs 0.8 0.7 - 4.0 K/uL   Monocytes Relative 19 %   Monocytes Absolute 1.4 (H) 0.1 - 1.0 K/uL   Eosinophils Relative 3 %   Eosinophils Absolute 0.2 0.0 - 0.5 K/uL   Basophils Relative 0 %   Basophils Absolute 0.0 0.0 - 0.1 K/uL   Immature Granulocytes 0 %   Abs Immature Granulocytes 0.02 0.00 - 0.07 K/uL  Comprehensive metabolic panel  Result Value Ref Range   Sodium 138 135 - 145 mmol/L   Potassium 3.7 3.5 - 5.1 mmol/L   Chloride 100 98 - 111 mmol/L   CO2 29 22 - 32 mmol/L   Glucose, Bld 127 (H) 70 - 99 mg/dL   BUN 18 8 - 23 mg/dL   Creatinine, Ser 0.85 0.44 - 1.00 mg/dL   Calcium 9.0 8.9 - 10.3 mg/dL   Total Protein 6.8 6.5 - 8.1 g/dL   Albumin 3.2 (L) 3.5 - 5.0 g/dL   AST 41 15 - 41 U/L   ALT 34 0 - 44 U/L   Alkaline Phosphatase 180 (H) 38 - 126 U/L   Total Bilirubin 0.7 0.3 - 1.2 mg/dL   GFR calc non Af Amer 60 (L) >60 mL/min   GFR calc Af Amer >60 >60 mL/min   Anion gap 9 5 - 15  Urinalysis, Routine w reflex microscopic  Result Value Ref Range   Color, Urine YELLOW YELLOW   APPearance CLEAR CLEAR   Specific Gravity, Urine 1.015 1.005 - 1.030   pH 7.0 5.0 - 8.0   Glucose, UA NEGATIVE NEGATIVE mg/dL   Hgb urine dipstick NEGATIVE NEGATIVE   Bilirubin Urine NEGATIVE NEGATIVE   Ketones, ur NEGATIVE NEGATIVE mg/dL   Protein, ur NEGATIVE NEGATIVE mg/dL   Nitrite NEGATIVE  NEGATIVE   Leukocytes,Ua SMALL (A) NEGATIVE   RBC / HPF 0-5 0 - 5 RBC/hpf   WBC, UA 0-5 0 - 5 WBC/hpf   Bacteria, UA NONE SEEN NONE SEEN   Squamous Epithelial / LPF 0-5 0 - 5   Hyaline Casts, UA PRESENT    Dg Chest 2 View  Result Date: 02/05/2019 CLINICAL DATA:  Altered mental status, extremity pain EXAM: CHEST - 2 VIEW COMPARISON:  11/21/2018 chest radiograph. FINDINGS: Stable cardiomediastinal silhouette with mild cardiomegaly. No pneumothorax. No pleural effusion. Stable mild eventration of the right hemidiaphragm. No overt pulmonary edema. Mild bibasilar atelectasis. No acute consolidative airspace disease. Advanced bilateral shoulder osteoarthritis. IMPRESSION: Stable mild cardiomegaly without overt pulmonary edema.  Mild bibasilar atelectasis. Electronically Signed   By: Ilona Sorrel M.D.   On: 02/05/2019 20:01   Dg Forearm Right  Result Date: 02/05/2019 CLINICAL DATA:  Right forearm pain. EXAM: RIGHT FOREARM - 2 VIEW COMPARISON:  None. FINDINGS: There is no evidence of fracture or other focal bone lesions. Vascular calcifications are noted. IMPRESSION: No acute abnormality seen in the right forearm. Electronically Signed   By: Marijo Conception M.D.   On: 02/05/2019 20:08   Ct Head Wo Contrast  Result Date: 02/05/2019 CLINICAL DATA:  Altered mental status EXAM: CT HEAD WITHOUT CONTRAST TECHNIQUE: Contiguous axial images were obtained from the base of the skull through the vertex without intravenous contrast. COMPARISON:  Head CT 11/19/2018 FINDINGS: Brain: There is no mass, hemorrhage or extra-axial collection. There is generalized atrophy without lobar predilection. Hypodensity of the white matter is most commonly associated with chronic microvascular disease. Multiple old small vessel infarcts of the basal ganglia. Vascular: No abnormal hyperdensity of the major intracranial arteries or dural venous sinuses. No intracranial atherosclerosis. Skull: The visualized skull base, calvarium and  extracranial soft tissues are normal. Sinuses/Orbits: No fluid levels or advanced mucosal thickening of the visualized paranasal sinuses. No mastoid or middle ear effusion. The orbits are normal. IMPRESSION: 1. No acute intracranial abnormality. 2. Chronic ischemic microangiopathy and multiple old small vessel infarcts. Electronically Signed   By: Ulyses Jarred M.D.   On: 02/05/2019 19:27   Dg Humerus Right  Result Date: 02/05/2019 CLINICAL DATA:  Right arm pain. EXAM: RIGHT HUMERUS - 2+ VIEW COMPARISON:  Radiographs of December 04, 2010. FINDINGS: There appears to be anterior dislocation of the proximal right humerus. No definite fracture is noted. IMPRESSION: Anterior dislocation of proximal right humerus. Electronically Signed   By: Marijo Conception M.D.   On: 02/05/2019 20:07   Dg Hand Complete Left  Result Date: 02/05/2019 CLINICAL DATA:  Right hand pain since yesterday.  No known injury. EXAM: LEFT HAND - COMPLETE 3+ VIEW COMPARISON:  None. FINDINGS: No acute bony or joint abnormality is identified. Scattered degenerative change is present appears worst at the first [facility] joint and distal radioulnar joint. Soft tissues about the wrist appear swollen. Atherosclerosis noted. IMPRESSION: No acute abnormality. Scattered osteoarthritis appearing most advanced at the first [facility] and distal radioulnar joints. Electronically Signed   By: Inge Rise M.D.   On: 02/05/2019 20:04   Dg Hand Complete Right  Result Date: 02/05/2019 CLINICAL DATA:  Altered mental status. Extremity pain. No reported injury. EXAM: RIGHT HAND - COMPLETE 3+ VIEW COMPARISON:  None. FINDINGS: No fracture or dislocation. No suspicious focal osseous lesions. Severe first carpometacarpal and third DIP joint osteoarthritis. Moderate radiocarpal osteoarthritis. Mild-to-moderate osteoarthritis throughout remaining interphalangeal joints. No radiopaque foreign body. IMPRESSION: No fracture or dislocation. Severe polyarticular osteoarthritis in  the right hand as detailed. Electronically Signed   By: Ilona Sorrel M.D.   On: 02/05/2019 20:04   Dg Foot Complete Left  Result Date: 02/05/2019 CLINICAL DATA:  Bilateral foot pain. EXAM: LEFT FOOT - COMPLETE 3+ VIEW COMPARISON:  None. FINDINGS: There is no evidence of fracture or dislocation. There is no evidence of arthropathy or other focal bone abnormality. Vascular calcifications are noted. IMPRESSION: No acute abnormality seen in the left foot. Electronically Signed   By: Marijo Conception M.D.   On: 02/05/2019 20:05   Dg Foot Complete Right  Result Date: 02/05/2019 CLINICAL DATA:  Right foot pain. EXAM: RIGHT FOOT COMPLETE - 3+ VIEW COMPARISON:  Radiographs of March 09, 2017. FINDINGS: There is no evidence of fracture or dislocation. There is no evidence of arthropathy. Moderate post tear calcaneal spurring is noted. Vascular calcifications are noted. IMPRESSION: No acute abnormality seen in the right foot. Electronically Signed   By: Marijo Conception M.D.   On: 02/05/2019 20:11   Dg Hips Bilat W Or Wo Pelvis 3-4 Views  Result Date: 02/05/2019 CLINICAL DATA:  Bilateral hip pain. EXAM: DG HIP (WITH OR WITHOUT PELVIS) 3-4V BILAT COMPARISON:  None. FINDINGS: Status post bilateral total hip arthroplasties. No fracture or dislocation is noted. IMPRESSION: No acute abnormality seen in either hip. Electronically Signed   By: Marijo Conception M.D.   On: 02/05/2019 20:12    Procedures Procedures (including critical care time)  Medications Ordered in ED Medications  HYDROmorphone (DILAUDID) injection 1 mg (1 mg Intramuscular Given 02/05/19 2221)  feeding supplement (BOOST / RESOURCE BREEZE) liquid 1 Container (1 Container Oral Given 02/06/19 0092)     Initial Impression / Assessment and Plan / ED Course  I have reviewed the triage vital signs and the nursing notes.  Pertinent labs & imaging results that were available during my care of the patient were reviewed by me and considered in my medical  decision making (see chart for details).        Patient turned over to evening ED physician for disposition following labs.  And x-ray results.  Final Clinical Impressions(s) / ED Diagnoses   Final diagnoses:  Atrial fibrillation, unspecified type ([place])  Foot pain, bilateral  Right hand pain  Neurologic gait dysfunction    ED Discharge Orders         [place]     02/05/19 2351    Face-to-face encounter (required for Medicare/Medicaid patients)    Comments:  I Virgel Manifold certify that this patient is under my care and that I, or a nurse practitioner or physician's assistant working with me, had a face-to-face encounter that meets the physician face-to-face encounter requirements with this patient on 02/05/2019. The encounter with the patient was in whole, or in part for the following medical condition(s) which is the primary reason for home health care (List medical condition): severe arthritis pain. ambulatory dysfunction   02/05/19 2351           Fredia Sorrow, MD 02/08/19 1052

## 2019-02-05 NOTE — ED Notes (Signed)
Pt sats 84% on RA per EMS. Pt on oxygen PRN. Pt came in on 2L nasal cannula.

## 2019-02-05 NOTE — Progress Notes (Signed)
2nd shift ED CSW received a call from [name] Walker, [name] Walker's granddaughter.   CSW discussed continuity of care for [name] Walker. CSW listened as [name] Walker express the need for her grandmother to be placed somewhere. [name] Walker went into detail and stated that her grandmother could not live alone and that it was not safe. [name] Walker reported that [name] Walker could not physically care for herself. [name] Walker states that although she has POA over her finances, she can not make her go into LTC.   CSW educated family of new policies involving not needing 2 witnesses to complete health care POA paper work. CSW suggested for family to follow up with PCP to complete placement paperwork if [name] Walker is agreeable.   EPD is aware of conversation and is to order HH/RN/SW to assist with discharge.    1st shift CSW will continue to follow up regarding this matter  [place] [facility]itions of Care  Clinical Social Worker  Ph: [phone]

## 2019-02-05 NOTE — ED Triage Notes (Signed)
Per EMS, pt c/o bilateral foot pain and swelling for "a while." Pt also c/o RT hand pain that radiates up arm that began yesterday. Pt from home.

## 2019-02-06 ENCOUNTER — Inpatient Hospital Stay
Admission: RE | Admit: 2019-02-06 | Discharge: 2019-02-17 | Disposition: A | Discharge status: Home / Self Care | Payer: Medicare Other | Source: Ambulatory Visit | Attending: Internal Medicine | Admitting: Internal Medicine

## 2019-02-06 DIAGNOSIS — I110 Hypertensive heart disease with heart failure: Secondary | ICD-10-CM | POA: Diagnosis not present

## 2019-02-06 DIAGNOSIS — J439 Emphysema, unspecified: Secondary | ICD-10-CM | POA: Diagnosis not present

## 2019-02-06 DIAGNOSIS — M79672 Pain in left foot: Secondary | ICD-10-CM | POA: Diagnosis not present

## 2019-02-06 DIAGNOSIS — M19011 Primary osteoarthritis, right shoulder: Secondary | ICD-10-CM | POA: Diagnosis not present

## 2019-02-06 DIAGNOSIS — M79671 Pain in right foot: Secondary | ICD-10-CM | POA: Diagnosis not present

## 2019-02-06 DIAGNOSIS — R600 Localized edema: Secondary | ICD-10-CM | POA: Diagnosis not present

## 2019-02-06 DIAGNOSIS — D519 Vitamin B12 deficiency anemia, unspecified: Secondary | ICD-10-CM | POA: Diagnosis not present

## 2019-02-06 DIAGNOSIS — K588 Other irritable bowel syndrome: Secondary | ICD-10-CM | POA: Diagnosis not present

## 2019-02-06 DIAGNOSIS — F0151 Vascular dementia with behavioral disturbance: Secondary | ICD-10-CM | POA: Diagnosis not present

## 2019-02-06 DIAGNOSIS — Z1159 Encounter for screening for other viral diseases: Secondary | ICD-10-CM | POA: Diagnosis not present

## 2019-02-06 DIAGNOSIS — Z7901 Long term (current) use of anticoagulants: Secondary | ICD-10-CM | POA: Diagnosis not present

## 2019-02-06 DIAGNOSIS — Z741 Need for assistance with personal care: Secondary | ICD-10-CM | POA: Diagnosis not present

## 2019-02-06 DIAGNOSIS — D6489 Other specified anemias: Secondary | ICD-10-CM | POA: Diagnosis not present

## 2019-02-06 DIAGNOSIS — I482 Chronic atrial fibrillation, unspecified: Secondary | ICD-10-CM | POA: Diagnosis not present

## 2019-02-06 DIAGNOSIS — J449 Chronic obstructive pulmonary disease, unspecified: Secondary | ICD-10-CM | POA: Diagnosis not present

## 2019-02-06 DIAGNOSIS — K219 Gastro-esophageal reflux disease without esophagitis: Secondary | ICD-10-CM | POA: Diagnosis not present

## 2019-02-06 DIAGNOSIS — Z9181 History of falling: Secondary | ICD-10-CM | POA: Diagnosis not present

## 2019-02-06 DIAGNOSIS — Z79899 Other long term (current) drug therapy: Secondary | ICD-10-CM | POA: Diagnosis not present

## 2019-02-06 DIAGNOSIS — I10 Essential (primary) hypertension: Secondary | ICD-10-CM | POA: Diagnosis not present

## 2019-02-06 DIAGNOSIS — M1991 Primary osteoarthritis, unspecified site: Secondary | ICD-10-CM | POA: Diagnosis not present

## 2019-02-06 DIAGNOSIS — C07 Malignant neoplasm of parotid gland: Secondary | ICD-10-CM | POA: Diagnosis not present

## 2019-02-06 DIAGNOSIS — G4700 Insomnia, unspecified: Secondary | ICD-10-CM | POA: Diagnosis not present

## 2019-02-06 DIAGNOSIS — R269 Unspecified abnormalities of gait and mobility: Secondary | ICD-10-CM | POA: Diagnosis not present

## 2019-02-06 DIAGNOSIS — D649 Anemia, unspecified: Secondary | ICD-10-CM | POA: Diagnosis not present

## 2019-02-06 DIAGNOSIS — Z9981 Dependence on supplemental oxygen: Secondary | ICD-10-CM | POA: Diagnosis not present

## 2019-02-06 DIAGNOSIS — I5022 Chronic systolic (congestive) heart failure: Secondary | ICD-10-CM | POA: Diagnosis not present

## 2019-02-06 DIAGNOSIS — M79641 Pain in right hand: Secondary | ICD-10-CM | POA: Diagnosis not present

## 2019-02-06 DIAGNOSIS — R2681 Unsteadiness on feet: Secondary | ICD-10-CM | POA: Diagnosis not present

## 2019-02-06 DIAGNOSIS — I4891 Unspecified atrial fibrillation: Secondary | ICD-10-CM | POA: Diagnosis not present

## 2019-02-06 DIAGNOSIS — M8589 Other specified disorders of bone density and structure, multiple sites: Secondary | ICD-10-CM | POA: Diagnosis not present

## 2019-02-06 DIAGNOSIS — M6281 Muscle weakness (generalized): Secondary | ICD-10-CM | POA: Diagnosis not present

## 2019-02-06 DIAGNOSIS — R262 Difficulty in walking, not elsewhere classified: Secondary | ICD-10-CM | POA: Diagnosis not present

## 2019-02-06 LAB — SARS CORONAVIRUS 2 BY RT PCR (HOSPITAL ORDER, PERFORMED IN ~~LOC~~ HOSPITAL LAB): SARS Coronavirus 2: NEGATIVE

## 2019-02-06 NOTE — NC FL2 (Addendum)
[place] LEVEL OF CARE SCREENING TOOL     IDENTIFICATION  Patient Name: [name] Walker Birthdate: Jun 26, 1925 Sex: female Admission Date (Current Location): 02/05/2019  [facility] and [place] Number:  [employer] and Address:  [place] [address] [place]      Provider Number: [id number]  Attending Physician Name and Address:  Default, Provider, MD  Relative Name and Phone Number:       Current Level of Care: Hospital Recommended Level of Care: [place] Prior Approval Number:    Date Approved/Denied:   PASRR Number:   [id number]  Discharge Plan: SNF    Current Diagnoses: Patient Active Problem List   Diagnosis Date Noted  . Pressure injury of skin 11/22/2018  . Bacteremia due to Gram-negative bacteria 11/20/2018  . AKI (acute kidney injury) ([place]) 11/20/2018  . Sepsis due to urinary tract infection ([place]) 11/19/2018  . Primary osteoarthritis of right shoulder 07/10/2018  . Primary osteoarthritis involving multiple joints 03/03/2018  . Imbalance 03/03/2018  . Frequent falls 03/03/2018  . Supplemental oxygen dependent 07/14/2016  . C5 vertebral fracture ([place]) 05/25/2016  . Hypokalemia 05/23/2016  . B12 deficiency 08/24/2015  . Insomnia 08/15/2015  . 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 Atrial fibrillation, chronic 07/22/2015  . Atrial fibrillation with RVR ([place]) 07/13/2015  . H/O parotidectomy 05/17/2015  . Osteopenia 05/13/2015  . Weakness 07/15/2013  . Anemia 01/06/2009  . Depression, recurrent ([place]) 01/06/2009  . Essential hypertension 01/06/2009  . COPD (chronic obstructive pulmonary disease) ([place]) 01/06/2009  . GERD 01/06/2009  . IRRITABLE BOWEL SYNDROME 01/06/2009    Orientation RESPIRATION BLADDER Height & Weight     Self, Situation, Place  O2(supplemental 2L at home) Continent Weight: 73 kg Height:  4\' 9"  (144.8 cm)  BEHAVIORAL SYMPTOMS/MOOD  NEUROLOGICAL BOWEL NUTRITION STATUS  (none) (none) Continent Diet  AMBULATORY STATUS COMMUNICATION OF NEEDS Skin   Extensive Assist Verbally Normal                       Personal Care Assistance Level of Assistance  Bathing, Feeding, Dressing Bathing Assistance: Maximum assistance Feeding assistance: Independent Dressing Assistance: Maximum assistance     Functional Limitations Info  Sight, Hearing, Speech Sight Info: Adequate Hearing Info: Adequate Speech Info: Adequate    SPECIAL CARE FACTORS FREQUENCY  PT (By licensed PT)     PT Frequency: 5X/W              Contractures Contractures Info: Not present    Additional Factors Info  Code Status, Allergies Code Status Info: full Allergies Info: diltiazem, lohexol, letex, penicillims, shellfish           Current Medications (02/06/2019):  This is the current hospital active medication list Current Facility-Administered Medications  Medication Dose Route Frequency Provider Last Rate Last Dose  . cyanocobalamin ((VITAMIN B-12)) injection 1,000 mcg  1,000 mcg Intramuscular Q30 days Claretta Fraise, MD   1,000 mcg at 04/24/18 1154   Current Outpatient Medications  Medication Sig Dispense Refill  . acetaminophen (TYLENOL) 500 MG tablet Take 500-1,000 mg by mouth every 6 (six) hours as needed for mild pain or moderate pain.     Marland Kitchen amitriptyline (ELAVIL) 75 MG tablet Take 1 tablet (75 mg total) by mouth at bedtime. 90 tablet 0  . Calcium Carbonate-Vitamin D (CALCIUM-VITAMIN D) 500-200 MG-UNIT tablet Take 1 tablet by mouth every morning.    . carvedilol (COREG)  3.125 MG tablet TAKE  (1)  TABLET TWICE A DAY WITH MEALS (BREAKFAST AND SUPPER) 60 tablet 2  . donepezil (ARICEPT) 10 MG tablet Take 1 tablet (10 mg total) by mouth at bedtime. 90 tablet 0  . ELIQUIS 5 MG TABS tablet TAKE (1) TABLET TWICE A DAY. 60 tablet 0  . furosemide (LASIX) 40 MG tablet Take 1 tablet (40 mg total) by mouth daily. 60 tablet 5  . gabapentin  (NEURONTIN) 300 MG capsule TAKE (1) CAPSULE TWICE DAILY. 180 capsule 0  . levocetirizine (XYZAL) 5 MG tablet Take 1 tablet (5 mg total) by mouth at bedtime. 90 tablet 3  . morphine (MSIR) 15 MG tablet Take 1 tablet by mouth 3 (three) times daily.    Marland Kitchen nystatin (MYCOSTATIN/NYSTOP) powder Apply topically 3 (three) times daily. 30 g 2  . omeprazole (PRILOSEC) 20 MG capsule TAKE 2 CAPSULES DAILY AS NEEDED FOR REFLUX 180 capsule 0  . OXYGEN Inhale into the lungs at bedtime.    . potassium chloride (K-DUR) 10 MEQ tablet TAKE 2 TABLETS DAILY AS DIRECTED 60 tablet 0  . rOPINIRole (REQUIP) 1 MG tablet TAKE 1 OR 2 TABLETS AT BEDTIME 180 tablet 0  . verapamil (CALAN-SR) 180 MG CR tablet Take 1 tablet by mouth daily.    Marland Kitchen albuterol (PROVENTIL) (2.5 MG/3ML) 0.083% nebulizer solution Take 3 mLs (2.5 mg total) by nebulization every 2 (two) hours as needed for wheezing or shortness of breath. (Patient not taking: Reported on 02/05/2019) 75 mL 12  . dextromethorphan (DELSYM) 30 MG/5ML liquid Take 5 mLs (30 mg total) by mouth 2 (two) times daily as needed for cough. (Patient not taking: Reported on 02/05/2019) 89 mL 0  . predniSONE (DELTASONE) 10 MG tablet Take three a day for three days then two a day for three days then one a day for three days, then DC (Patient not taking: Reported on 02/05/2019) 18 tablet 0     Discharge Medications: Please see discharge summary for a list of discharge medications.  Relevant Imaging Results:  Relevant Lab Results:   Additional [place], LCSW

## 2019-02-06 NOTE — Plan of Care (Signed)
  Problem: Acute Rehab PT Goals(only PT should resolve) Goal: Patient Will Perform Sitting Balance Outcome: Progressing Flowsheets (Taken 02/06/2019 1123) Patient will perform sitting balance: with modified independence Goal: Patient Will Transfer Sit To/From Stand Outcome: Progressing Flowsheets (Taken 02/06/2019 1123) Patient will transfer sit to/from stand: with min guard assist Goal: Pt Will Ambulate Outcome: Progressing Flowsheets (Taken 02/06/2019 1123) Pt will Ambulate: 25 feet;with rolling walker;with cane;with minimal assist;with moderate assist Goal: Pt Will Go Supine/Side To Sit Outcome: Progressing Flowsheets (Taken 02/06/2019 1123) Pt will go Supine/Side to Sit: with minimal assist Goal: Pt Will Go Sit To Supine/Side Outcome: Progressing Flowsheets (Taken 02/06/2019 1123) Pt will go Sit to Supine/Side: with minimal assist Goal: Pt Will Transfer Bed To Chair/Chair To Bed Outcome: Progressing Flowsheets (Taken 02/06/2019 1124) Pt will Transfer Bed to Chair/Chair to Bed: with min assist   11:25 AM, 02/06/19 Lonell Grandchild, MPT Physical Therapist with [facility] [phone] office [phone] mobile phone

## 2019-02-06 NOTE — ED Notes (Signed)
Report given to [facility]. 

## 2019-02-06 NOTE — Discharge Instructions (Addendum)
Take your usual prescriptions as previously directed.  Call your regular medical doctor today to schedule a follow up appointment within the next 1 day.  Return to the Emergency Department immediately sooner if worsening.  ° °

## 2019-02-06 NOTE — ED Notes (Signed)
James from PT in to evaluate pt.

## 2019-02-06 NOTE — ED Notes (Signed)
James from PT recommends inpatient.  Jeneen Rinks to informed Barbaraann Rondo SW of findings.

## 2019-02-06 NOTE — Clinical Social Work Note (Signed)
[facility] has made bed offer.  Will now attempt to convince patient that it is in her bvest interest to go.

## 2019-02-06 NOTE — TOC Initial Note (Signed)
Transition of Care [facility]) - Initial/Assessment Note    Patient Details  Name: [name] Walker MRN: [id number] Date of Birth: [birth year]  [facility]) CM/SW Contact:    Aylissa Heinemann Dimitri Ped, LCSW Phone Number: 02/06/2019, 12:01 AM  Clinical Narrative:       85 Year old caucasian female who was admitted with the following Dx:  Atrial Fibrillation. Pt also has a dislocated shoulder. Pt lives at home alone and is unable to manage safely. Pt's family is interested in long term placement but pt refuses.   Pt to discharge home with HH/RN/SW to assist with safety planning. Pt to follow up with PCP Claretta Fraise, MD.  . [place] Transitions of Care  Clinical Social Worker  Ph: [phone]   Expected Discharge Plan: [place] Services Barriers to Discharge: No Barriers Identified   Patient Goals and CMS Choice Patient states their goals for this hospitalization and ongoing recovery are:: to return home CMS Medicare.gov Compare Post Acute Care list provided to:: Patient Choice offered to / list presented to : Patient  Expected Discharge Plan and Services Expected Discharge Plan: [place] In-house Referral: Clinical Social Work   Post Acute Care Choice: [place] arrangements for the past 2 months: Single Family Home                           HH Arranged: RN, Social Work          Prior Living Arrangements/Services Living arrangements for the past 2 months: Single Family Home Lives with:: Self   Do you feel safe going back to the place where you live?: Yes      Need for Family Participation in Patient Care: Yes (Comment) Care giver support system in place?: No (comment) Current home services: Safety alert Criminal Activity/Legal Involvement Pertinent to Current Situation/Hospitalization: No - Comment as needed  Activities of Daily Living      Permission Sought/Granted Permission sought to share  information with : Case Manager Permission granted to share information with : Yes, Verbal Permission Granted              Emotional Assessment Appearance:: Appears stated age Attitude/Demeanor/Rapport: Engaged Affect (typically observed): Accepting, Calm Orientation: : Oriented to Self, Oriented to  Time, Oriented to Situation, Oriented to Place   Psych Involvement: No (comment)  Admission diagnosis:  Foot Pain Patient Active Problem List   Diagnosis Date Noted  . Pressure injury of skin 11/22/2018  . Bacteremia due to Gram-negative bacteria 11/20/2018  . AKI (acute kidney injury) ([place]) 11/20/2018  . Sepsis due to urinary tract infection ([place]) 11/19/2018  . Primary osteoarthritis of right shoulder 07/10/2018  . Primary osteoarthritis involving multiple joints 03/03/2018  . Imbalance 03/03/2018  . Frequent falls 03/03/2018  . Supplemental oxygen dependent 07/14/2016  . C5 vertebral fracture ([place]) 05/25/2016  . Hypokalemia 05/23/2016  . B12 deficiency 08/24/2015  . Insomnia 08/15/2015  . Primary squamous cell carcinoma of parotid gland ([place]) 07/25/2015  . Atrial fibrillation, chronic 07/22/2015  . Atrial fibrillation with RVR ([place]) 07/13/2015  . H/O parotidectomy 05/17/2015  . Osteopenia 05/13/2015  . Weakness 07/15/2013  . Anemia 01/06/2009  . Depression, recurrent ([place]) 01/06/2009  . Essential hypertension 01/06/2009  . COPD (chronic obstructive pulmonary disease) ([place]) 01/06/2009  . GERD 01/06/2009  . IRRITABLE BOWEL SYNDROME 01/06/2009   PCP:  Claretta Fraise, MD Pharmacy:   [place],  [place] [address] Phone: [phone] Fax: [phone]  [place] [place] [facility], [facility] [address] [place] [address] [place] [address] Phone: [phone] Fax: [phone]     Social Determinants of Health (SDOH) Interventions    Readmission Risk Interventions No  flowsheet data found.

## 2019-02-06 NOTE — ED Notes (Signed)
Per SW pt is getting HH and SW consult at home. Family to pick pt up in the AM.

## 2019-02-06 NOTE — TOC Transition Note (Signed)
Transition of Care [facility]) - CM/SW Discharge Note   Patient Details  Name: [name] Walker MRN: [id number] Date of Birth: January 20, 1925  [facility] CM/SW Contact:  Trish Mage, LCSW Phone Number: 02/06/2019, 1:22 PM   Clinical Narrative:  Pt reluctantly agreed to transfer to [facility] for rehab after we called Vickie's daughter Mindy to confirm that shw ould pick up the dog and take him home to care for him.  Went over expectations with granddaughter [name] that [facility] is not long term placement, that she needs to pursue MCD eligibility through DSS and be prepared to either take patient home at end of stay or have another facility lined up for placement.   Also communicated with Benard Rink, [facility] NP, throughout the process to talk about options and how to deal with this situation.  [phone].    Final next level of care: Skilled Nursing Facility Barriers to Discharge: No Barriers Identified   Patient Goals and CMS Choice Patient states their goals for this hospitalization and ongoing recovery are:: to return home CMS Medicare.gov Compare Post Acute Care list provided to:: Patient Choice offered to / list presented to : Patient  Discharge Placement   Existing PASRR number confirmed : 02/06/19          Patient chooses bed at: [facility] Patient to be transferred to facility by: nursing staff thorugh tunnel Name of family member notified: Marcie Bal, grandaughter, [phone] Patient and family notified of of transfer: 02/06/19  Discharge Plan and Services In-house Referral: Clinical Social Work   Post Acute Care Choice: Home Health                    HH Arranged: RN, Social Work          Social Determinants of Health ([place]) Interventions     Readmission Risk Interventions No flowsheet data found.

## 2019-02-06 NOTE — ED Provider Notes (Signed)
Pt waiting in ED for family to pick her up to take her home this morning. Family refused, stating pt unable to take care of herself at home alone and needed SNF placement. PT consult ordered: recommended inpt treatment. SW consulted:  [facility] will accept pt in transfer today.  Will d/c stable.    Francine Graven, DO 02/06/19 1420

## 2019-02-06 NOTE — Evaluation (Signed)
Physical Therapy Evaluation Patient Details Name: [name] Walker MRN: [id number] DOB: Apr 16, 1925 Today's Date: 02/06/2019   History of Present Illness  [name] Walker is a 85 y/o female with c/o bilateral foot pain and swelling, right hand pain that radiates up arm that began yesterday.    Clinical Impression Patient is at severe risk for falls due to limited use of right UE and LE due to increased pain with movement or pressure.  Patient unable to use RUE for bed mobility or holding onto RW during transfers and also c/o severe pain when weightbearing on right foot during transfer to chair and back to bed.  Patient will benefit from continued physical therapy in hospital and recommended venue below to increase strength, balance, endurance for safe ADLs and gait.      Follow Up Recommendations SNF;Supervision/Assistance - 24 hour;Supervision for mobility/OOB    Equipment Recommendations  None recommended by PT    Recommendations for Other Services OT consult     Precautions / Restrictions Precautions Precautions: Fall Restrictions Weight Bearing Restrictions: No      Mobility  Bed Mobility Overal bed mobility: Needs Assistance Bed Mobility: Supine to Sit;Sit to Supine     Supine to sit: Mod assist Sit to supine: Mod assist   General bed mobility comments: increased time, slow labored movement with limited use of RUE/RLE due to increased pain  Transfers Overall transfer level: Needs assistance Equipment used: Rolling walker (2 wheeled);1 person hand held assist Transfers: Sit to/from [facility] Sit to Stand: Min assist;Mod assist Stand pivot transfers: Mod assist       General transfer comment: unable to use right hand to hold onto RW due to pain, limited weightbearing on right foot due to pain  Ambulation/Gait Ambulation/Gait assistance: Mod assist;Max assist Gait Distance (Feet): 3 Feet Assistive device: Rolling walker (2 wheeled);1 person  hand held assist Gait Pattern/deviations: Decreased step length - right;Decreased step length - left;Decreased stance time - right;Decreased stride length;Antalgic Gait velocity: slow   General Gait Details: limited to 3-4 short unsteady labored steps at bedside with limited weightbearing on RLE due to foot pain and unable to use RUE to hold onto [employer]            Wheelchair Mobility    Modified Rankin (Stroke Patients Only)       Balance Overall balance assessment: Needs assistance Sitting-balance support: Single extremity supported;Feet supported Sitting balance-Leahy Scale: Fair Sitting balance - Comments: unable to use RUE for support due to pain   Standing balance support: No upper extremity supported;During functional activity Standing balance-Leahy Scale: Poor Standing balance comment: poor without AD, fair/poor using LUE to hold onto RW                             Pertinent Vitals/Pain Pain Assessment: Faces Faces Pain Scale: Hurts whole lot Pain Location: RUE, right foot Pain Descriptors / Indicators: Aching;Discomfort;Grimacing;Guarding;Sore    Home Living Family/patient expects to be discharged to:: Private residence Living Arrangements: Alone Available Help at Discharge: Friend(s);Neighbor Type of Home: House Home Access: [place]: [place]: Environmental consultant - 2 wheels;Walker - 4 wheels;Cane - single point;Bedside commode;Shower seat;Electric scooter Additional Comments: sleeps in lift chair, has grab bar on lift chair    Prior Function Level of Independence: Needs assistance   Gait / Transfers Assistance Needed: household ambulator with Rollator, uses electric wheelchair for longer distances  ADL's / Homemaking Assistance Needed: family, friends, [place] assist        Hand Dominance   Dominant Hand: Left    Extremity/Trunk Assessment   Upper Extremity Assessment Upper Extremity Assessment:  Generalized weakness;RUE deficits/detail;LUE deficits/detail RUE Deficits / Details: grossly -3/5 RUE: Unable to fully assess due to pain LUE Deficits / Details: grossly 4/5    Lower Extremity Assessment Lower Extremity Assessment: Generalized weakness;RLE deficits/detail;LLE deficits/detail RLE Deficits / Details: grossly 3+/5 RLE: Unable to fully assess due to pain LLE Deficits / Details: grossly 4/5    Cervical / Trunk Assessment Cervical / Trunk Assessment: Normal  Communication   Communication: No difficulties  Cognition Arousal/Alertness: Awake/alert Behavior During Therapy: WFL for tasks assessed/performed Overall Cognitive Status: Within Functional Limits for tasks assessed                                        General Comments      Exercises     Assessment/Plan    PT Assessment Patient needs continued PT services  PT Problem List Decreased strength;Decreased range of motion;Decreased activity tolerance;Decreased balance;Decreased mobility       PT Treatment Interventions DME instruction;Gait training;Functional mobility training;Therapeutic activities;Therapeutic exercise;Patient/family education    PT Goals (Current goals can be found in the Care Plan section)  Acute Rehab PT Goals Patient Stated Goal: Return home assistance PT Goal Formulation: With patient Time For Goal Achievement: 02/20/19 Potential to Achieve Goals: [place]    Frequency Min 3X/week   Barriers to discharge        Co-evaluation               AM-PAC PT "6 Clicks" Mobility  Outcome Measure Help needed turning from your back to your side while in a flat bed without using bedrails?: A Lot Help needed moving from lying on your back to sitting on the side of a flat bed without using bedrails?: A Lot Help needed moving to and from a bed to a chair (including a wheelchair)?: A Lot Help needed standing up from a chair using your arms (e.g., wheelchair or bedside  chair)?: A Lot Help needed to walk in hospital room?: A Lot Help needed climbing 3-5 steps with a railing? : Total 6 Click Score: 11    End of Session Equipment Utilized During Treatment: Gait belt Activity Tolerance: Patient tolerated treatment well;Patient limited by fatigue;Patient limited by pain Patient left: in bed;with call bell/phone within reach Nurse Communication: Mobility status PT Visit Diagnosis: Unsteadiness on feet (R26.81);Other abnormalities of gait and mobility (R26.89);Muscle weakness (generalized) (M62.81);Pain Pain - Right/Left: Right Pain - part of body: Arm;Ankle and joints of foot    Time: 9323-5573 PT Time Calculation (min) (ACUTE ONLY): 47 min   Charges:   PT Evaluation $PT Eval High Complexity: 1 High PT Treatments $Therapeutic Activity: 38-52 mins        11:20 AM, 02/06/19 Lonell Grandchild, MPT Physical Therapist with [facility] [phone] office [phone] mobile phone

## 2019-02-09 ENCOUNTER — Non-Acute Institutional Stay (SKILLED_NURSING_FACILITY): Payer: Medicare Other | Admitting: Adult Health

## 2019-02-09 ENCOUNTER — Encounter: Payer: Self-pay | Admitting: Adult Health

## 2019-02-09 ENCOUNTER — Other Ambulatory Visit: Payer: Self-pay | Admitting: Adult Health

## 2019-02-09 ENCOUNTER — Ambulatory Visit: Payer: Medicare Other | Admitting: Family Medicine

## 2019-02-09 DIAGNOSIS — K219 Gastro-esophageal reflux disease without esophagitis: Secondary | ICD-10-CM

## 2019-02-09 DIAGNOSIS — I110 Hypertensive heart disease with heart failure: Secondary | ICD-10-CM

## 2019-02-09 DIAGNOSIS — C07 Malignant neoplasm of parotid gland: Secondary | ICD-10-CM | POA: Diagnosis not present

## 2019-02-09 DIAGNOSIS — F0151 Vascular dementia with behavioral disturbance: Secondary | ICD-10-CM

## 2019-02-09 DIAGNOSIS — I4891 Unspecified atrial fibrillation: Secondary | ICD-10-CM

## 2019-02-09 DIAGNOSIS — G609 Hereditary and idiopathic neuropathy, unspecified: Secondary | ICD-10-CM | POA: Insufficient documentation

## 2019-02-09 DIAGNOSIS — I5022 Chronic systolic (congestive) heart failure: Secondary | ICD-10-CM | POA: Diagnosis not present

## 2019-02-09 DIAGNOSIS — J439 Emphysema, unspecified: Secondary | ICD-10-CM

## 2019-02-09 DIAGNOSIS — M150 Primary generalized (osteo)arthritis: Secondary | ICD-10-CM

## 2019-02-09 DIAGNOSIS — M159 Polyosteoarthritis, unspecified: Secondary | ICD-10-CM

## 2019-02-09 DIAGNOSIS — F01518 Vascular dementia, unspecified severity, with other behavioral disturbance: Secondary | ICD-10-CM

## 2019-02-09 MED ORDER — MORPHINE SULFATE 15 MG PO TABS: 15.0000 mg | ORAL_TABLET | Freq: Three times a day (TID) | ORAL | 0 refills | Status: DC

## 2019-02-09 NOTE — Progress Notes (Signed)
Location:   [facility] Room Number: [place] of Service:  SNF (31)   CODE STATUS: Full Code  Allergies  Allergen Reactions  . Diltiazem Anxiety and Other (See Comments)    Sleep disturbance  . Iohexol      Desc: THROAT SWELLING-NOTED IN CHART AFTER IVC PLACEMENT-ARS 12-02-05   . Latex Other (See Comments)    unknown  . Penicillins Swelling    Patient was given zosyn this admission and tolerated. After d/w MD and pt, she had swelling in arm only, so possible just infiltration and not allergic. ABx changed to Cefazolin and is tolerating  . Shellfish Allergy Other (See Comments)    Reaction unknown    Chief Complaint  Patient presents with  . Hospitalization Follow-up    Hospital follow up    HPI:  She is a 85 year old woman who presented to the ED for multiple joint pain. She is confused; is unable to meet her own adl needs. She is here for short term rehab. She is wanting to go home; however; more than likely she will require long term placement in either SNF or assisted living. There are no reports of uncontrolled pain; no reports of changes in appetite. She is somewhat restless today; and has difficulty focusing. She will continue to followed for her chronic illnesses including: afib; chf; emphysema.   Past Medical History:  Diagnosis Date  . Cataract   . COPD (chronic obstructive pulmonary disease) ([place])   . Depression   . DVT (deep venous thrombosis) ([place])   . Gastric erosion   . GERD (gastroesophageal reflux disease)   . Hypertension   . Insomnia   . Ischemic colitis ([place])   . Osteoarthritis   . Parotid mass 2016  . 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 ABDOMINAL HYSTERECTOMY    . BACK SURGERY    . Cataracts    . CHOLECYSTECTOMY    . COLONOSCOPY    . ESOPHAGOGASTRODUODENOSCOPY  10/17/2007   erosion   . KNEE ARTHROPLASTY     bilateral  .  PAROTIDECTOMY Left 05/17/2015   Procedure: PAROTIDECTOMY- LEFT TOTAL;  Surgeon: Leta Baptist, MD;  Location: [facility];  Service: ENT;  Laterality: Left;  . TOTAL HIP ARTHROPLASTY     bilateral    Social History   Socioeconomic History  . Marital status: Widowed    Spouse name: Not on file  . Number of children: Not on file  . Years of education: Not on file  . Highest education level: Not on file  Occupational History  . Not on file  Social Needs  . Financial resource strain: Not on file  . Food insecurity:    Worry: Not on file    Inability: Not on file  . Transportation needs:    Medical: Not on file    Non-medical: Not on file  Tobacco Use  . Smoking status: Never Smoker  . Smokeless tobacco: Never Used  Substance and Sexual Activity  . Alcohol use: No  . Drug use: No  . Sexual activity: Never    Birth control/protection: Surgical, Post-menopausal  Lifestyle  . Physical activity:    Days per week: Not on file    Minutes per session: Not on file  . Stress: Not on file  Relationships  . Social connections:    Talks on phone: Not on file  Gets together: Not on file    Attends religious service: Not on file    Active member of club or organization: Not on file    Attends meetings of clubs or organizations: Not on file    Relationship status: Not on file  . Intimate partner violence:    Fear of current or ex partner: Not on file    Emotionally abused: Not on file    Physically abused: Not on file    Forced sexual activity: Not on file  Other Topics Concern  . Not on file  Social History Narrative   Lives home alone. Neighbor and daughter check in on patient    Family History  Problem Relation Age of Onset  . Rheum arthritis Mother   . Ulcers Mother        on foot  . Ulcers Father   . Cancer Sister        uterine      VITAL SIGNS BP (!) 103/59   Pulse 70   Temp 97.7 F (36.5 C)   Resp 18   Ht '4\' 9"'$  (1.448 m)   Wt 166 lb 3.2 oz (75.4  kg)   SpO2 100%   BMI 35.97 kg/m   No facility-administered encounter medications on file as of 02/09/2019.    Outpatient Encounter Medications as of 02/09/2019  Medication Sig  . acetaminophen (TYLENOL) 500 MG tablet Take 500 mg by mouth every 6 (six) hours as needed for mild pain or moderate pain.   Marland Kitchen albuterol (PROVENTIL) (2.5 MG/3ML) 0.083% nebulizer solution Take 3 mLs (2.5 mg total) by nebulization every 2 (two) hours as needed for wheezing or shortness of breath.  Marland Kitchen amitriptyline (ELAVIL) 75 MG tablet Take 1 tablet (75 mg total) by mouth at bedtime.  Marland Kitchen apixaban (ELIQUIS) 5 MG TABS tablet Take 5 mg by mouth 2 (two) times daily.  . Calcium Carbonate-Vitamin D (CALCIUM-VITAMIN D) 500-200 MG-UNIT tablet Take 1 tablet by mouth every morning.  . carvedilol (COREG) 3.125 MG tablet Take 3.125 mg by mouth 2 (two) times daily with a meal.  . dextromethorphan (DELSYM) 30 MG/5ML liquid Take 5 mLs (30 mg total) by mouth 2 (two) times daily as needed for cough.  . donepezil (ARICEPT) 10 MG tablet Take 1 tablet (10 mg total) by mouth at bedtime.  . furosemide (LASIX) 40 MG tablet Take 1 tablet (40 mg total) by mouth daily.  Marland Kitchen gabapentin (NEURONTIN) 300 MG capsule Take 300 mg by mouth 2 (two) times daily.  Marland Kitchen levocetirizine (XYZAL) 5 MG tablet Take 1 tablet (5 mg total) by mouth at bedtime.  Marland Kitchen morphine (MSIR) 15 MG tablet Take 1 tablet (15 mg total) by mouth 3 (three) times daily.  Marland Kitchen nystatin (MYCOSTATIN/NYSTOP) powder Apply topically 3 (three) times daily.  Marland Kitchen omeprazole (PRILOSEC) 40 MG capsule Take 40 mg by mouth daily as needed.  . OXYGEN Inhale 2 L/min into the lungs continuous.   . potassium chloride SA (K-DUR) 20 MEQ tablet Take 20 mEq by mouth daily.  . predniSONE (DELTASONE) 10 MG tablet Take three a day for three days then two a day for three days then one a day for three days, then DC  . rOPINIRole (REQUIP) 1 MG tablet Take 1 mg by mouth at bedtime.  . verapamil (CALAN-SR) 180 MG CR tablet  Take 1 tablet by mouth daily.     SIGNIFICANT DIAGNOSTIC EXAMS  TODAY:   11-20-18: 2-d echo:   1. The left ventricle has mildly reduced  systolic function, with an ejection fraction of 45-50% in the setting of atrial fibrillation. Left ventricular diastolic Doppler parameters are indeterminate.  2. Right ventricular contraction is mildly reduced. Normal estimated RVSP of 27.7 mmHg.  3. Left atrial size was moderately dilated.  4. Right atrial size was moderately dilated.  5. The aortic valve is tricuspid. Mild calcification of the aortic valve. Mild to moderate aortic annular calcification noted. Possible mild to moderate aortic regurgitation based on limited interrogation.  6. The mitral valve is normal in structure. There is moderate mitral annular calcification present. Mitral valve regurgitation is mild to moderate by color flow Doppler.  7. The tricuspid valve is normal in structure.  8. The inferior vena cava was dilated in size with >50% respiratory variability.  02-05-19: ct of head: 1. No acute intracranial abnormality. 2. Chronic ischemic microangiopathy and multiple old small vessel infarcts.   LABS REVIEWED TODAY:   11-19-18: tsh 1.196 02-05-19: wbc 7.7; hgb 10.9; hct 33.9; mcv 96.0; plt 336; glucose 127; bun 18; creat 0.85; k+ 3.7; na++ 138; ca 9.0; alk phos 180; albumin 3.2    Review of Systems  Unable to perform ROS: Dementia (unable to participate )    Physical Exam Constitutional:      General: She is not in acute distress.    Appearance: She is well-developed and overweight. She is not diaphoretic.  Neck:     Musculoskeletal: Neck supple.     Thyroid: No thyromegaly.     Comments: History of left parotidectomy  Cardiovascular:     Rate and Rhythm: Normal rate. Rhythm irregular.     Pulses: Normal pulses.     Heart sounds: Normal heart sounds.  Pulmonary:     Effort: Pulmonary effort is normal. No respiratory distress.     Comments: 02 dependent breath  sounds diminished  Abdominal:     General: Bowel sounds are normal. There is no distension.     Palpations: Abdomen is soft.     Tenderness: There is no abdominal tenderness.  Musculoskeletal:     Right lower leg: No edema.     Left lower leg: No edema.     Comments: Is able to move all extremities History of bilateral hip and knee replacements History of back surgery   Lymphadenopathy:     Cervical: No cervical adenopathy.  Skin:    General: Skin is warm and dry.     Comments: Bilateral lower extremities discolored   Neurological:     Mental Status: She is alert. Mental status is at baseline.  Psychiatric:     Comments: Is nervous and easily agitated       ASSESSMENT/ PLAN:  TODAY:   1. Atrial fibrillation with RVR: heart rate stable: will continue coreg 3.125 mg twice daily and calan sr 180 mg daily  for rate control; eliquis 5 mg twice daily   2. Chronic systolic congestive heart failure is stable: EF 40-45% (11-20-18) will continue lasix 40 mg daily with k+ 20 meq daily coreg 3.125 mg twice daily   3. Hypertensive heart disease with chronic systolic congestive heart failure: is stable b/p  103/59: will continue coreg 3.125 mg twice daily and calan sr 180 mg daily   4. Pulmonary emphysema unspecified emphysema type: is stable is 02 dependent will albuterol neb every 2 hours as needed; delsym 5 cc twice daily as needed xyzal 5 mg daily and will complete prednisone taper.   5. Primary squamous cell carcinoma of parotid gland: is stable  is status post left parotidectomy   6. GERD without esophagitis: is stable will continue prilosec 40 mg daily as needed  7. Vascular dementia with behavioral disturbance: is without change weight is 166 pounds; will continue aricept 10 mg daily   8. Primary osteoarthritis involving multiple joints: is stable has history of bilateral knee and hip replacements. Will continue morphine 15 mg three times daily   9. Peripheral neuropathy  idiopathic: is stable will continue neurontin 300 mg twice daily elavil 75 mg nightly   10. Restless leg syndrome: is stable will continue requip 1 mg nightly     MD is aware of resident's narcotic use and is in agreement with current plan of care. We will attempt to wean resident as apropriate   Ok Edwards NP [facility]  Contact [phone] Monday through Friday 8am- 5pm  After hours call [phone]

## 2019-02-12 ENCOUNTER — Non-Acute Institutional Stay (SKILLED_NURSING_FACILITY): Payer: Medicare Other | Admitting: Internal Medicine

## 2019-02-12 ENCOUNTER — Encounter: Payer: Self-pay | Admitting: Internal Medicine

## 2019-02-12 DIAGNOSIS — J439 Emphysema, unspecified: Secondary | ICD-10-CM | POA: Diagnosis not present

## 2019-02-12 DIAGNOSIS — R296 Repeated falls: Secondary | ICD-10-CM

## 2019-02-12 DIAGNOSIS — I10 Essential (primary) hypertension: Secondary | ICD-10-CM | POA: Diagnosis not present

## 2019-02-12 DIAGNOSIS — I4891 Unspecified atrial fibrillation: Secondary | ICD-10-CM | POA: Diagnosis not present

## 2019-02-12 DIAGNOSIS — F0151 Vascular dementia with behavioral disturbance: Secondary | ICD-10-CM

## 2019-02-12 DIAGNOSIS — D6489 Other specified anemias: Secondary | ICD-10-CM

## 2019-02-12 DIAGNOSIS — F01518 Vascular dementia, unspecified severity, with other behavioral disturbance: Secondary | ICD-10-CM

## 2019-02-12 DIAGNOSIS — F339 Major depressive disorder, recurrent, unspecified: Secondary | ICD-10-CM

## 2019-02-12 NOTE — Progress Notes (Deleted)
Location:    [facility] Room Number: 156/W Place of Service:  SNF [phone]) Provider:  Veleta Miners MD  Claretta Fraise, MD  Patient Care Team: Claretta Fraise, MD as PCP - General (Family Medicine) Monna Fam, MD as Consulting Physician (Ophthalmology) Eppie Gibson, MD as Attending Physician (Radiation Oncology) Leta Baptist, MD as Consulting Physician (Otolaryngology) [name], NP as [place] [facility]  Extended Emergency Contact Information Primary Emergency Contact: Paulick,Linda Address: [address]          [place], [place] [address] Johnnette Litter of [place] Phone: [phone] Mobile Phone: [phone] Relation: Daughter  Code Status:  Full Code Goals of care: Advanced Directive information Advanced Directives 02/12/2019  Does Patient Have a Medical Advance Directive? Yes  Type of Advance Directive (No Data)  Does patient want to make changes to medical advance directive? No - Patient declined  Copy of [place] in Chart? -  Would patient like information on creating a medical advance directive? No - Patient declined  Pre-existing out of facility DNR order (yellow form or pink MOST form) -     Chief Complaint  Patient presents with   New Admit To SNF    New Admission Visit    HPI:  Pt is a 85 y.o. 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   Past Surgical History:  Procedure Laterality Date   ABDOMINAL HYSTERECTOMY     BACK SURGERY     Cataracts     CHOLECYSTECTOMY     COLONOSCOPY      ESOPHAGOGASTRODUODENOSCOPY  10/17/2007   erosion    KNEE ARTHROPLASTY     bilateral   PAROTIDECTOMY Left 05/17/2015   Procedure: PAROTIDECTOMY- LEFT TOTAL;  Surgeon: Leta Baptist, MD;  Location: [facility];  Service: ENT;  Laterality: Left;   TOTAL HIP ARTHROPLASTY     bilateral    Allergies  Allergen Reactions   Diltiazem Anxiety and Other (See Comments)    Sleep disturbance   Iohexol      Desc: THROAT SWELLING-NOTED IN CHART AFTER IVC PLACEMENT-ARS 12-02-05    Latex Other (See Comments)    unknown   Penicillins Swelling    Patient was given zosyn this admission and tolerated. After d/w MD and pt, she had swelling in arm only, so possible just infiltration and not allergic. ABx changed to Cefazolin and is tolerating   Shellfish Allergy Other (See Comments)    Reaction unknown    No facility-administered encounter medications on file as of 02/12/2019.    Outpatient Encounter Medications as of 02/12/2019  Medication Sig   acetaminophen (TYLENOL) 500 MG tablet Take 500 mg by mouth every 6 (six) hours as needed for mild pain or moderate pain.    albuterol (PROVENTIL) (2.5 MG/3ML) 0.083% nebulizer solution Take 3 mLs (2.5 mg total) by nebulization every 2 (two) hours as needed for wheezing or shortness of breath.   amitriptyline (ELAVIL) 75 MG tablet Take 1 tablet (75 mg total) by mouth at  bedtime.   apixaban (ELIQUIS) 5 MG TABS tablet Take 5 mg by mouth 2 (two) times daily.   Balsam [place]-Castor Oil (VENELEX) OINT Apply topically. Apply to bilateral buttocks qshift every evening   Calcium Carbonate-Vitamin D (CALCIUM-VITAMIN D) 500-200 MG-UNIT tablet Take 1 tablet by mouth every morning.   carvedilol (COREG) 3.125 MG tablet Take 3.125 mg by mouth 2 (two) times daily with a meal.   dextromethorphan (DELSYM) 30 MG/5ML liquid Take 5 mLs (30 mg total) by mouth 2 (two) times daily as needed for cough.   donepezil (ARICEPT) 10 MG tablet Take 1 tablet (10 mg total) by  mouth at bedtime.   furosemide (LASIX) 40 MG tablet Take 1 tablet (40 mg total) by mouth daily.   gabapentin (NEURONTIN) 300 MG capsule Take 300 mg by mouth 2 (two) times daily.   levocetirizine (XYZAL) 5 MG tablet Take 1 tablet (5 mg total) by mouth at bedtime.   morphine (MSIR) 15 MG tablet Take 1 tablet (15 mg total) by mouth 3 (three) times daily.   nystatin (MYCOSTATIN/NYSTOP) powder Apply topically 3 (three) times daily.   omeprazole (PRILOSEC) 40 MG capsule Take 40 mg by mouth daily as needed.   OXYGEN Inhale 2 L/min into the lungs continuous.    potassium chloride SA (K-DUR) 20 MEQ tablet Take 20 mEq by mouth daily.   predniSONE (DELTASONE) 10 MG tablet Take three a day for three days then two a day for three days then one a day for three days, then DC   rOPINIRole (REQUIP) 1 MG tablet Take 1 mg by mouth at bedtime.   verapamil (CALAN-SR) 180 MG CR tablet Take 1 tablet by mouth daily.     Review of Systems  Immunization History  Administered Date(s) Administered   Influenza,inj,Quad PF,6+ Mos 07/15/2014, 07/13/2016   PPD Test 02/06/2019   Pneumococcal Conjugate-13 07/15/2014   Tdap 05/07/2012, 03/07/2017   Zoster 05/07/2014   Pertinent  Health Maintenance Due  Topic Date Due   PNA vac Low Risk Adult (2 of 2 - PPSV23) 03/12/2019 (Originally 07/16/2015)   INFLUENZA VACCINE  04/25/2019   DEXA SCAN  Completed   Fall Risk  06/04/2018 05/29/2018 05/20/2018 04/24/2018 03/31/2018  Falls in the past year? Yes Yes Yes Yes Yes  Number falls in past yr: 2 or more 2 or more 2 or more 1 1  Injury with Fall? Yes Yes Yes No No  Comment - shoulder pain - - -  Risk Factor Category  High Fall Risk - High Fall Risk - -  Risk for fall due to : - - History of fall(s);Medication side effect;Impaired balance/gait - -  Risk for fall due to: Comment - - - - -  Follow up - - Falls evaluation completed;Falls prevention discussed - -   Functional Status Survey:    Vitals:    02/12/19 1204  BP: 120/69  Pulse: 64  Resp: 20  Temp: 98.2 F (36.8 C)  TempSrc: Oral  SpO2: 96%  Weight: 166 lb 3.2 oz (75.4 kg)  Height: 4\' 9"  (1.448 m)   Body mass index is 35.97 kg/m. Physical Exam  Labs reviewed: Recent Labs    11/19/18 1235  11/22/18 0411 11/23/18 0357 02/05/19 1715  NA 137   < > 136 140 138  K 3.8   < > 4.1 4.5 3.7  CL 103   < > 108 108 100  CO2 24   < > 23 27 29   GLUCOSE 101*   < >  83 118* 127*  BUN 15   < > 23 18 18   CREATININE 1.14*   < > 1.07* 0.98 0.85  CALCIUM 8.5*   < > 8.3* 8.5* 9.0  MG 1.8  --   --   --   --    < > = values in this interval not displayed.   Recent Labs    03/03/18 1357 11/19/18 1235 02/05/19 1715  AST 21 21 41  ALT 13 11 34  ALKPHOS 122* 123 180*  BILITOT 0.2 0.8 0.7  PROT 6.9 6.2* 6.8  ALBUMIN 4.1 3.1* 3.2*   Recent Labs    11/22/18 0411 11/23/18 0357 02/05/19 1715  WBC 12.0* 8.3 7.7  NEUTROABS 8.8* 5.3 5.2  HGB 10.3* 10.6* 10.9*  HCT 34.5* 35.3* 33.9*  MCV 101.2* 101.1* 96.0  PLT 206 230 336   Lab Results  Component Value Date   TSH 1.196 11/19/2018   No results found for: HGBA1C Lab Results  Component Value Date   CHOL 180 05/13/2015   HDL 62 05/13/2015   LDLCALC 86 05/13/2015   TRIG 161 (H) 05/13/2015   CHOLHDL 2.9 05/13/2015    Significant Diagnostic Results in last 30 days:  Dg Chest 2 View  Result Date: 02/05/2019 CLINICAL DATA:  Altered mental status, extremity pain EXAM: CHEST - 2 VIEW COMPARISON:  11/21/2018 chest radiograph. FINDINGS: Stable cardiomediastinal silhouette with mild cardiomegaly. No pneumothorax. No pleural effusion. Stable mild eventration of the right hemidiaphragm. No overt pulmonary edema. Mild bibasilar atelectasis. No acute consolidative airspace disease. Advanced bilateral shoulder osteoarthritis. IMPRESSION: Stable mild cardiomegaly without overt pulmonary edema. Mild bibasilar atelectasis. Electronically Signed   By: Ilona Sorrel M.D.   On: 02/05/2019 20:01    Dg Forearm Right  Result Date: 02/05/2019 CLINICAL DATA:  Right forearm pain. EXAM: RIGHT FOREARM - 2 VIEW COMPARISON:  None. FINDINGS: There is no evidence of fracture or other focal bone lesions. Vascular calcifications are noted. IMPRESSION: No acute abnormality seen in the right forearm. Electronically Signed   By: Marijo Conception M.D.   On: 02/05/2019 20:08   Ct Head Wo Contrast  Result Date: 02/05/2019 CLINICAL DATA:  Altered mental status EXAM: CT HEAD WITHOUT CONTRAST TECHNIQUE: Contiguous axial images were obtained from the base of the skull through the vertex without intravenous contrast. COMPARISON:  Head CT 11/19/2018 FINDINGS: Brain: There is no mass, hemorrhage or extra-axial collection. There is generalized atrophy without lobar predilection. Hypodensity of the white matter is most commonly associated with chronic microvascular disease. Multiple old small vessel infarcts of the basal ganglia. Vascular: No abnormal hyperdensity of the major intracranial arteries or dural venous sinuses. No intracranial atherosclerosis. Skull: The visualized skull base, calvarium and extracranial soft tissues are normal. Sinuses/Orbits: No fluid levels or advanced mucosal thickening of the visualized paranasal sinuses. No mastoid or middle ear effusion. The orbits are normal. IMPRESSION: 1. No acute intracranial abnormality. 2. Chronic ischemic microangiopathy and multiple old small vessel infarcts. Electronically Signed   By: Ulyses Jarred M.D.   On: 02/05/2019 19:27   Dg Humerus Right  Result Date: 02/05/2019 CLINICAL DATA:  Right arm pain. EXAM: RIGHT HUMERUS - 2+ VIEW COMPARISON:  Radiographs of December 04, 2010. FINDINGS: There appears to be anterior dislocation of the proximal right humerus. No definite fracture is noted. IMPRESSION: Anterior dislocation of proximal right humerus. Electronically Signed   By: Marijo Conception M.D.   On: 02/05/2019 20:07   Dg Hand Complete Left  Result Date:  02/05/2019 CLINICAL DATA:  Right hand pain since yesterday.  No known injury. EXAM: LEFT HAND - COMPLETE 3+ VIEW COMPARISON:  None. FINDINGS: No acute bony or joint abnormality is identified. Scattered degenerative change is present appears worst at the first [facility] joint and distal radioulnar joint. Soft tissues about the wrist appear swollen. Atherosclerosis noted. IMPRESSION: No acute abnormality. Scattered osteoarthritis appearing most advanced at the first [facility] and distal radioulnar joints. Electronically Signed   By: Inge Rise M.D.   On: 02/05/2019 20:04   Dg Hand Complete Right  Result Date: 02/05/2019 CLINICAL DATA:  Altered mental status. Extremity pain. No reported injury. EXAM: RIGHT HAND - COMPLETE 3+ VIEW COMPARISON:  None. FINDINGS: No fracture or dislocation. No suspicious focal osseous lesions. Severe first carpometacarpal and third DIP joint osteoarthritis. Moderate radiocarpal osteoarthritis. Mild-to-moderate osteoarthritis throughout remaining interphalangeal joints. No radiopaque foreign body. IMPRESSION: No fracture or dislocation. Severe polyarticular osteoarthritis in the right hand as detailed. Electronically Signed   By: Ilona Sorrel M.D.   On: 02/05/2019 20:04   Dg Foot Complete Left  Result Date: 02/05/2019 CLINICAL DATA:  Bilateral foot pain. EXAM: LEFT FOOT - COMPLETE 3+ VIEW COMPARISON:  None. FINDINGS: There is no evidence of fracture or dislocation. There is no evidence of arthropathy or other focal bone abnormality. Vascular calcifications are noted. IMPRESSION: No acute abnormality seen in the left foot. Electronically Signed   By: Marijo Conception M.D.   On: 02/05/2019 20:05   Dg Foot Complete Right  Result Date: 02/05/2019 CLINICAL DATA:  Right foot pain. EXAM: RIGHT FOOT COMPLETE - 3+ VIEW COMPARISON:  Radiographs of March 09, 2017. FINDINGS: There is no evidence of fracture or dislocation. There is no evidence of arthropathy. Moderate post tear calcaneal spurring is  noted. Vascular calcifications are noted. IMPRESSION: No acute abnormality seen in the right foot. Electronically Signed   By: Marijo Conception M.D.   On: 02/05/2019 20:11   Dg Hips Bilat W Or Wo Pelvis 3-4 Views  Result Date: 02/05/2019 CLINICAL DATA:  Bilateral hip pain. EXAM: DG HIP (WITH OR WITHOUT PELVIS) 3-4V BILAT COMPARISON:  None. FINDINGS: Status post bilateral total hip arthroplasties. No fracture or dislocation is noted. IMPRESSION: No acute abnormality seen in either hip. Electronically Signed   By: Marijo Conception M.D.   On: 02/05/2019 20:12    Assessment/Plan There are no diagnoses linked to this encounter.   Family/ staff Communication:   Labs/tests ordered:

## 2019-02-12 NOTE — Progress Notes (Signed)
Provider: Veleta Miners MD  Location:    [facility] Room Number: 156/W Place of Service:  SNF (31)  PCP: Claretta Fraise, MD Patient Care Team: Claretta Fraise, MD as PCP - General (Family Medicine) Monna Fam, MD as Consulting Physician (Ophthalmology) Eppie Gibson, MD as Attending Physician (Radiation Oncology) Leta Baptist, MD as Consulting Physician (Otolaryngology) [name], NP as [facility] Management  Extended Emergency Contact Information Primary Emergency Contact: Becka,Linda Address: [address]          [place], [place] [address] Johnnette Litter of [place] Phone: [phone] Mobile Phone: [phone] Relation: Daughter  Code Status: Full Code Goals of Care: Advanced Directive information Advanced Directives 02/12/2019  Does Patient Have a Medical Advance Directive? Yes  Type of Advance Directive (No Data)  Does patient want to make changes to medical advance directive? No - Patient declined  Copy of [place] in Chart? -  Would patient like information on creating a medical advance directive? No - Patient declined  Pre-existing out of facility DNR order (yellow form or pink MOST form) -      Chief Complaint  Patient presents with  . New Admit To SNF    New Admission Visit    HPI: Patient is a 85 y.o. female seen today for admission to SNF for therapy. Patient admitted from ED for therapy. She has a history of COPD, PAF on Eliquis, Hypertension, Anemia,Diastolic CHF, cognitive impairment, chronic arthritis and chronic pain also has a history of Parotidectomy due to squamous cell carcinoma in 2016 Lives by herself and according to her she was having pain in her hands and legs which would not get better in spite of her morphine dose.  She has some church friends who help her.  And she did told her to call 911 and go to ED.  In ED acute process was ruled out but then her family did not come to pick  her up because they said she needs long-term care. Patient she is feeling much better and she wants to go home. She denies any cough chest pain or shortness of breath   Past Medical History:  Diagnosis Date  . Cataract   . COPD (chronic obstructive pulmonary disease) ([facility])   . Depression   . DVT (deep venous thrombosis) ([facility])   . Gastric erosion   . GERD (gastroesophageal reflux disease)   . Hypertension   . Insomnia   . Ischemic colitis ([place])   . Osteoarthritis   . Parotid mass 2016  . 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 ABDOMINAL HYSTERECTOMY    . BACK SURGERY    . Cataracts    . CHOLECYSTECTOMY    . COLONOSCOPY    . ESOPHAGOGASTRODUODENOSCOPY  10/17/2007   erosion   . KNEE ARTHROPLASTY     bilateral  . PAROTIDECTOMY Left 05/17/2015   Procedure: PAROTIDECTOMY- LEFT TOTAL;  Surgeon: Leta Baptist, MD;  Location: [facility];  Service: ENT;  Laterality: Left;  . TOTAL HIP ARTHROPLASTY     bilateral    reports that she has never smoked. She has never used smokeless tobacco. She reports that she does not drink alcohol or use drugs. Social History   Socioeconomic History  . Marital status: Widowed    Spouse name: Not on file  . Number of children: Not on file  . Years of education: Not on  file  . Highest education level: Not on file  Occupational History  . Not on file  Social Needs  . Financial resource strain: Not on file  . Food insecurity:    Worry: Not on file    Inability: Not on file  . Transportation needs:    Medical: Not on file    Non-medical: Not on file  Tobacco Use  . Smoking status: Never Smoker  . Smokeless tobacco: Never Used  Substance and Sexual Activity  . Alcohol use: No  . Drug use: No  . Sexual activity: Never    Birth control/protection: Surgical, Post-menopausal  Lifestyle  . Physical activity:    Days per week: Not on file    Minutes per  session: Not on file  . Stress: Not on file  Relationships  . Social connections:    Talks on phone: Not on file    Gets together: Not on file    Attends religious service: Not on file    Active member of club or organization: Not on file    Attends meetings of clubs or organizations: Not on file    Relationship status: Not on file  . Intimate partner violence:    Fear of current or ex partner: Not on file    Emotionally abused: Not on file    Physically abused: Not on file    Forced sexual activity: Not on file  Other Topics Concern  . Not on file  Social History Narrative   Lives home alone. Neighbor and daughter check in on patient     Functional Status Survey:    Family History  Problem Relation Age of Onset  . Rheum arthritis Mother   . Ulcers Mother        on foot  . Ulcers Father   . Cancer Sister        uterine    Health Maintenance  Topic Date Due  . PNA vac Low Risk Adult (2 of 2 - PPSV23) 03/12/2019 (Originally 07/16/2015)  . INFLUENZA VACCINE  04/25/2019  . TETANUS/TDAP  03/08/2027  . DEXA SCAN  Completed    Allergies  Allergen Reactions  . Diltiazem Anxiety and Other (See Comments)    Sleep disturbance  . Iohexol      Desc: THROAT SWELLING-NOTED IN CHART AFTER IVC PLACEMENT-ARS 12-02-05   . Latex Other (See Comments)    unknown  . Penicillins Swelling    Patient was given zosyn this admission and tolerated. After d/w MD and pt, she had swelling in arm only, so possible just infiltration and not allergic. ABx changed to Cefazolin and is tolerating  . Shellfish Allergy Other (See Comments)    Reaction unknown    No facility-administered encounter medications on file as of 02/12/2019.    Outpatient Encounter Medications as of 02/12/2019  Medication Sig  . acetaminophen (TYLENOL) 500 MG tablet Take 500 mg by mouth every 6 (six) hours as needed for mild pain or moderate pain.   Marland Kitchen albuterol (PROVENTIL) (2.5 MG/3ML) 0.083% nebulizer solution Take 3 mLs  (2.5 mg total) by nebulization every 2 (two) hours as needed for wheezing or shortness of breath.  Marland Kitchen amitriptyline (ELAVIL) 75 MG tablet Take 1 tablet (75 mg total) by mouth at bedtime.  Marland Kitchen apixaban (ELIQUIS) 5 MG TABS tablet Take 5 mg by mouth 2 (two) times daily.  Roseanne Kaufman [place]-Castor Oil (VENELEX) OINT Apply topically. Apply to bilateral buttocks qshift every evening  . Calcium Carbonate-Vitamin D (CALCIUM-VITAMIN D) 500-200  MG-UNIT tablet Take 1 tablet by mouth every morning.  . carvedilol (COREG) 3.125 MG tablet Take 3.125 mg by mouth 2 (two) times daily with a meal.  . dextromethorphan (DELSYM) 30 MG/5ML liquid Take 5 mLs (30 mg total) by mouth 2 (two) times daily as needed for cough.  . donepezil (ARICEPT) 10 MG tablet Take 1 tablet (10 mg total) by mouth at bedtime.  . furosemide (LASIX) 40 MG tablet Take 1 tablet (40 mg total) by mouth daily.  Marland Kitchen gabapentin (NEURONTIN) 300 MG capsule Take 300 mg by mouth 2 (two) times daily.  Marland Kitchen levocetirizine (XYZAL) 5 MG tablet Take 1 tablet (5 mg total) by mouth at bedtime.  Marland Kitchen morphine (MSIR) 15 MG tablet Take 1 tablet (15 mg total) by mouth 3 (three) times daily.  Marland Kitchen nystatin (MYCOSTATIN/NYSTOP) powder Apply topically 3 (three) times daily.  Marland Kitchen omeprazole (PRILOSEC) 40 MG capsule Take 40 mg by mouth daily as needed.  . OXYGEN Inhale 2 L/min into the lungs continuous.   . potassium chloride SA (K-DUR) 20 MEQ tablet Take 20 mEq by mouth daily.  . predniSONE (DELTASONE) 10 MG tablet Take three a day for three days then two a day for three days then one a day for three days, then DC  . rOPINIRole (REQUIP) 1 MG tablet Take 1 mg by mouth at bedtime.  . verapamil (CALAN-SR) 180 MG CR tablet Take 1 tablet by mouth daily.     Review of Systems  Review of Systems  Constitutional: Negative for activity change, appetite change, chills, diaphoresis, fatigue and fever.  HENT: Negative for mouth sores, postnasal drip, rhinorrhea, sinus pain and sore throat.    Respiratory: Negative for apnea, cough, chest tightness, shortness of breath and wheezing.   Cardiovascular: Negative for chest pain, palpitations and leg swelling.  Gastrointestinal: Negative for abdominal distention, abdominal pain,Positive for Constipation, diarrhea, nausea and vomiting.  Genitourinary: Negative for dysuria and frequency.  Musculoskeletal: Negative for arthralgias, joint swelling and myalgias.  Skin: Negative for rash.  Neurological: Negative for dizziness, syncope, weakness, light-headedness and numbness.  Psychiatric/Behavioral: Negative for behavioral problems, confusion and sleep disturbance.     Vitals:   02/12/19 1204  BP: 120/69  Pulse: 64  Resp: 20  Temp: 98.2 F (36.8 C)  TempSrc: Oral  SpO2: 96%  Weight: 166 lb 3.2 oz (75.4 kg)  Height: 4\' 9"  (1.448 m)   Body mass index is 35.97 kg/m. Physical Exam Vitals signs reviewed.  Constitutional:      Appearance: Normal appearance.  HENT:     Head: Normocephalic.     Nose: Nose normal.     Mouth/Throat:     Mouth: Mucous membranes are moist.     Pharynx: Oropharynx is clear.  Eyes:     Pupils: Pupils are equal, round, and reactive to light.  Neck:     Musculoskeletal: Neck supple.  Cardiovascular:     Rate and Rhythm: Normal rate. Rhythm irregular.     Pulses: Normal pulses.     Heart sounds: Normal heart sounds.  Pulmonary:     Effort: Pulmonary effort is normal. No respiratory distress.     Breath sounds: Normal breath sounds. No wheezing or rales.  Abdominal:     General: Abdomen is flat. Bowel sounds are normal. There is no distension.     Palpations: Abdomen is soft.     Tenderness: There is no abdominal tenderness. There is no guarding.  Musculoskeletal:     Comments: Mild swelling Bilateral HAS  Chronic Arthritis Changes but no Swelling  Skin:    General: Skin is warm and dry.  Neurological:     Mental Status: She is alert and oriented to person, place, and time.  Psychiatric:         Mood and Affect: Mood normal.        Thought Content: Thought content normal.        Judgment: Judgment normal.       Labs reviewed: Basic Metabolic Panel: Recent Labs    11/19/18 1235  11/22/18 0411 11/23/18 0357 02/05/19 1715  NA 137   < > 136 140 138  K 3.8   < > 4.1 4.5 3.7  CL 103   < > 108 108 100  CO2 24   < > 23 27 29   GLUCOSE 101*   < > 83 118* 127*  BUN 15   < > 23 18 18   CREATININE 1.14*   < > 1.07* 0.98 0.85  CALCIUM 8.5*   < > 8.3* 8.5* 9.0  MG 1.8  --   --   --   --    < > = values in this interval not displayed.   Liver Function Tests: Recent Labs    03/03/18 1357 11/19/18 1235 02/05/19 1715  AST 21 21 41  ALT 13 11 34  ALKPHOS 122* 123 180*  BILITOT 0.2 0.8 0.7  PROT 6.9 6.2* 6.8  ALBUMIN 4.1 3.1* 3.2*   No results for input(s): LIPASE, AMYLASE in the last 8760 hours. No results for input(s): AMMONIA in the last 8760 hours. CBC: Recent Labs    11/22/18 0411 11/23/18 0357 02/05/19 1715  WBC 12.0* 8.3 7.7  NEUTROABS 8.8* 5.3 5.2  HGB 10.3* 10.6* 10.9*  HCT 34.5* 35.3* 33.9*  MCV 101.2* 101.1* 96.0  PLT 206 230 336   Cardiac Enzymes: Recent Labs    03/24/18 1041  TROPONINI <0.03   BNP: Invalid input(s): POCBNP No results found for: HGBA1C Lab Results  Component Value Date   TSH 1.196 11/19/2018   Lab Results  Component Value Date   VITAMINB12 227 (L) 12/02/2017   No results found for: FOLATE No results found for: IRON, TIBC, FERRITIN  Imaging and Procedures obtained prior to SNF admission: No results found.  Assessment/Plan Atrial fibrillation with RVR  On Coreg and Eliquis Stays High risk due to her h/o Falls  COPD Uses Oxygen at night in home Will taper her Oxygen  Essential hypertension On Verapmil  Vascular dementia with behavior disturbance ( Patient wants to go home She is alert and oriented D/W the Social servisec They are working with her family On Aricept  Anemia  Hgb Low but stable Check iron  studies,and Repeat B12 level  Depression On Amitryptiline  Frequent falls Getting therapy Chronic Arthritis Was on prednisone taper Per ED Her pain is better Chronic Pain On morphine TID Also on Neurontin and Elavil Diastolic CHF On Lasix Creat stable Constipation Started on Senna and Miralax   Family/ staff Communication:   Labs/tests ordered: Iron Studies and Vit B12   Total time spent in this patient care encounter was  45_  minutes; greater than 50% of the visit spent counseling patient and staff, reviewing records , Labs and coordinating care for problems addressed at this encounter.

## 2019-02-13 ENCOUNTER — Encounter (HOSPITAL_COMMUNITY)
Admission: RE | Admit: 2019-02-13 | Discharge: 2019-02-13 | Disposition: A | Discharge status: Home / Self Care | Payer: Medicare Other | Source: Skilled Nursing Facility | Attending: *Deleted | Admitting: *Deleted

## 2019-02-13 ENCOUNTER — Other Ambulatory Visit: Payer: Self-pay | Admitting: Family Medicine

## 2019-02-13 LAB — BASIC METABOLIC PANEL
Anion gap: 9 (ref 5–15)
BUN: 30 mg/dL — ABNORMAL HIGH (ref 8–23)
CO2: 31 mmol/L (ref 22–32)
Calcium: 8.7 mg/dL — ABNORMAL LOW (ref 8.9–10.3)
Chloride: 98 mmol/L (ref 98–111)
Creatinine, Ser: 0.65 mg/dL (ref 0.44–1.00)
GFR calc Af Amer: >60 mL/min (ref >60)
GFR calc non Af Amer: >60 mL/min (ref >60)
Glucose, Bld: 82 mg/dL (ref 70–99)
Potassium: 4.6 mmol/L (ref 3.5–5.1)
Sodium: 138 mmol/L (ref 135–145)

## 2019-02-13 LAB — CBC
HCT: 34.7 % — ABNORMAL LOW (ref 36.0–46.0)
Hemoglobin: 10.8 g/dL — ABNORMAL LOW (ref 12.0–15.0)
MCH: 30.2 pg (ref 26.0–34.0)
MCHC: 31.1 g/dL (ref 30.0–36.0)
MCV: 96.9 fL (ref 80.0–100.0)
Platelets: 307 10*3/uL (ref 150–400)
RBC: 3.58 MIL/uL — ABNORMAL LOW (ref 3.87–5.11)
RDW: 13.9 % (ref 11.5–15.5)
WBC: 8.5 10*3/uL (ref 4.0–10.5)
nRBC: 0 % (ref 0.0–0.2)

## 2019-02-13 LAB — IRON: Iron: 57 ug/dL (ref 28–170)

## 2019-02-13 LAB — VITAMIN B12: Vitamin B-12: 407 pg/mL (ref 180–914)

## 2019-02-13 LAB — FERRITIN: Ferritin: 50 ng/mL (ref 11–307)

## 2019-02-17 ENCOUNTER — Other Ambulatory Visit (HOSPITAL_COMMUNITY)
Admission: RE | Admit: 2019-02-17 | Discharge: 2019-02-17 | Disposition: A | Discharge status: Home / Self Care | Payer: Medicare Other | Source: Skilled Nursing Facility | Attending: Internal Medicine | Admitting: Internal Medicine

## 2019-02-17 ENCOUNTER — Other Ambulatory Visit: Payer: Self-pay | Admitting: Adult Health

## 2019-02-17 ENCOUNTER — Encounter: Payer: Self-pay | Admitting: Adult Health

## 2019-02-17 ENCOUNTER — Non-Acute Institutional Stay (SKILLED_NURSING_FACILITY): Payer: Medicare Other | Admitting: Adult Health

## 2019-02-17 DIAGNOSIS — Z1159 Encounter for screening for other viral diseases: Secondary | ICD-10-CM | POA: Insufficient documentation

## 2019-02-17 DIAGNOSIS — I110 Hypertensive heart disease with heart failure: Secondary | ICD-10-CM | POA: Diagnosis not present

## 2019-02-17 DIAGNOSIS — I5022 Chronic systolic (congestive) heart failure: Secondary | ICD-10-CM

## 2019-02-17 DIAGNOSIS — J439 Emphysema, unspecified: Secondary | ICD-10-CM | POA: Diagnosis not present

## 2019-02-17 LAB — SARS CORONAVIRUS 2 BY RT PCR (HOSPITAL ORDER, PERFORMED IN ~~LOC~~ HOSPITAL LAB): SARS Coronavirus 2: NEGATIVE

## 2019-02-17 MED ORDER — CARVEDILOL 3.125 MG PO TABS: 3.1250 mg | ORAL_TABLET | Freq: Two times a day (BID) | ORAL | 0 refills | Status: DC

## 2019-02-17 MED ORDER — VERAPAMIL HCL ER 180 MG PO TBCR: 180.0000 mg | EXTENDED_RELEASE_TABLET | Freq: Every day | ORAL | 0 refills | Status: DC

## 2019-02-17 MED ORDER — GABAPENTIN 300 MG PO CAPS: 300.0000 mg | ORAL_CAPSULE | Freq: Two times a day (BID) | ORAL | 0 refills | Status: DC

## 2019-02-17 MED ORDER — APIXABAN 5 MG PO TABS: 5.0000 mg | ORAL_TABLET | Freq: Two times a day (BID) | ORAL | 0 refills | Status: DC

## 2019-02-17 MED ORDER — AMITRIPTYLINE HCL 75 MG PO TABS: 75.0000 mg | ORAL_TABLET | Freq: Every day | ORAL | 0 refills | Status: DC

## 2019-02-17 MED ORDER — ROPINIROLE HCL 1 MG PO TABS: 1.0000 mg | ORAL_TABLET | Freq: Every day | ORAL | 0 refills | Status: DC

## 2019-02-17 MED ORDER — MORPHINE SULFATE 15 MG PO TABS: 15.0000 mg | ORAL_TABLET | Freq: Three times a day (TID) | ORAL | 0 refills | Status: DC

## 2019-02-17 MED ORDER — NYSTATIN 100000 UNIT/GM EX POWD: Freq: Three times a day (TID) | CUTANEOUS | 0 refills | Status: DC

## 2019-02-17 MED ORDER — POTASSIUM CHLORIDE CRYS ER 20 MEQ PO TBCR: 20.0000 meq | EXTENDED_RELEASE_TABLET | Freq: Every day | ORAL | 0 refills | Status: DC

## 2019-02-17 MED ORDER — ALBUTEROL SULFATE (2.5 MG/3ML) 0.083% IN NEBU: 2.5000 mg | INHALATION_SOLUTION | RESPIRATORY_TRACT | 0 refills | Status: DC | PRN

## 2019-02-17 MED ORDER — OMEPRAZOLE 40 MG PO CPDR: 40.0000 mg | DELAYED_RELEASE_CAPSULE | Freq: Every day | ORAL | 0 refills | Status: DC | PRN

## 2019-02-17 MED ORDER — DONEPEZIL HCL 10 MG PO TABS: 10.0000 mg | ORAL_TABLET | Freq: Every day | ORAL | 0 refills | Status: DC

## 2019-02-17 MED ORDER — FUROSEMIDE 40 MG PO TABS: 40.0000 mg | ORAL_TABLET | Freq: Every day | ORAL | 0 refills | Status: DC

## 2019-02-17 NOTE — Progress Notes (Signed)
Location:   [facility] Room Number: [place] of Service:  SNF (31)    CODE STATUS: Full Code  Allergies  Allergen Reactions  . Diltiazem Anxiety and Other (See Comments)    Sleep disturbance  . Iohexol      Desc: THROAT SWELLING-NOTED IN CHART AFTER IVC PLACEMENT-ARS 12-02-05   . Latex Other (See Comments)    unknown  . Penicillins Swelling    Patient was given zosyn this admission and tolerated. After d/w MD and pt, she had swelling in arm only, so possible just infiltration and not allergic. ABx changed to Cefazolin and is tolerating  . Shellfish Allergy Other (See Comments)    Reaction unknown    Chief Complaint  Patient presents with  . Discharge Note    Discharging to home on 02/17/2019    HPI:  She is being discharged to her home today with home health for pt/ot/rn/cna/sw. She will not need any dme; she is on chornic 02 therapy prior to her hospitalization. She will need her prescriptions written and will need to follow up with her medical provider.  She is alert and oriented X3; is able to ambulate 75 feet with therapy. She has not been declared incompetent. This is probably not a wise decision on her part; however; she is medically stable to return home at this. She is being followed by [facility].     Past Medical History:  Diagnosis Date  . Cataract   . COPD (chronic obstructive pulmonary disease) ([facility])   . Depression   . DVT (deep venous thrombosis) ([facility])   . Gastric erosion   . GERD (gastroesophageal reflux disease)   . Hypertension   . Insomnia   . Ischemic colitis ([facility])   . Osteoarthritis   . Parotid mass 2016  . 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 ABDOMINAL HYSTERECTOMY    . BACK SURGERY    . Cataracts    . CHOLECYSTECTOMY    . COLONOSCOPY    . ESOPHAGOGASTRODUODENOSCOPY  10/17/2007   erosion   . KNEE ARTHROPLASTY     bilateral  .  PAROTIDECTOMY Left 05/17/2015   Procedure: PAROTIDECTOMY- LEFT TOTAL;  Surgeon: Leta Baptist, MD;  Location: [facility];  Service: ENT;  Laterality: Left;  . TOTAL HIP ARTHROPLASTY     bilateral    Social History   Socioeconomic History  . Marital status: Widowed    Spouse name: Not on file  . Number of children: Not on file  . Years of education: Not on file  . Highest education level: Not on file  Occupational History  . Not on file  Social Needs  . Financial resource strain: Not on file  . Food insecurity:    Worry: Not on file    Inability: Not on file  . Transportation needs:    Medical: Not on file    Non-medical: Not on file  Tobacco Use  . Smoking status: Never Smoker  . Smokeless tobacco: Never Used  Substance and Sexual Activity  . Alcohol use: No  . Drug use: No  . Sexual activity: Never    Birth control/protection: Surgical, Post-menopausal  Lifestyle  . Physical activity:    Days per week: Not on file    Minutes per session: Not on file  . Stress: Not on file  Relationships  . Social connections:    Talks on  phone: Not on file    Gets together: Not on file    Attends religious service: Not on file    Active member of club or organization: Not on file    Attends meetings of clubs or organizations: Not on file    Relationship status: Not on file  . Intimate partner violence:    Fear of current or ex partner: Not on file    Emotionally abused: Not on file    Physically abused: Not on file    Forced sexual activity: Not on file  Other Topics Concern  . Not on file  Social History Narrative   Lives home alone. Neighbor and daughter check in on patient    Family History  Problem Relation Age of Onset  . Rheum arthritis Mother   . Ulcers Mother        on foot  . Ulcers Father   . Cancer Sister        uterine    VITAL SIGNS BP 124/66   Pulse 70   Temp 98.3 F (36.8 C)   Resp 20   Ht '4\' 9"'$  (1.448 m)   Wt 163 lb 9.6 oz (74.2 kg)    SpO2 96%   BMI 35.40 kg/m   Patient's Medications  New Prescriptions   No medications on file  Previous Medications   ACETAMINOPHEN (TYLENOL) 500 MG TABLET    Take 500 mg by mouth every 6 (six) hours as needed for mild pain or moderate pain.    ALBUTEROL (PROVENTIL) (2.5 MG/3ML) 0.083% NEBULIZER SOLUTION    Take 3 mLs (2.5 mg total) by nebulization every 2 (two) hours as needed for wheezing or shortness of breath.   AMITRIPTYLINE (ELAVIL) 75 MG TABLET    Take 1 tablet (75 mg total) by mouth at bedtime.   APIXABAN (ELIQUIS) 5 MG TABS TABLET    Take 1 tablet (5 mg total) by mouth 2 (two) times daily. Needs to be seen   BALSAM PERU-CASTOR OIL (VENELEX) OINT    Apply to bilateral buttocks every shift and as needed for blanchable erythema   CALCIUM CARBONATE-VITAMIN D (CALCIUM-VITAMIN D) 500-200 MG-UNIT TABLET    Take 1 tablet by mouth every morning.   CARVEDILOL (COREG) 3.125 MG TABLET    Take 3.125 mg by mouth 2 (two) times daily with a meal.   DEXTROMETHORPHAN (DELSYM) 30 MG/5ML LIQUID    Take 5 mLs (30 mg total) by mouth 2 (two) times daily as needed for cough.   DONEPEZIL (ARICEPT) 10 MG TABLET    Take 1 tablet (10 mg total) by mouth at bedtime.   FUROSEMIDE (LASIX) 40 MG TABLET    Take 1 tablet (40 mg total) by mouth daily.   GABAPENTIN (NEURONTIN) 300 MG CAPSULE    Take 300 mg by mouth 2 (two) times daily.   LEVOCETIRIZINE (XYZAL) 5 MG TABLET    Take 1 tablet (5 mg total) by mouth at bedtime.   MAGNESIUM HYDROXIDE (MILK OF MAGNESIA) 400 MG/5ML SUSPENSION    Take 30 mLs by mouth daily as needed for mild constipation.   MENTHOL, TOPICAL ANALGESIC, (BIOFREEZE EX)    Apply topically as needed to right shoulder area for pain management   MORPHINE (MSIR) 15 MG TABLET    Take 1 tablet (15 mg total) by mouth 3 (three) times daily.   NON FORMULARY    Diet Type:  D1, thin liquids   NYSTATIN (MYCOSTATIN/NYSTOP) POWDER    Apply topically 3 (three) times daily.  OMEPRAZOLE (PRILOSEC) 40 MG CAPSULE     Take 40 mg by mouth daily as needed.   OXYGEN    Inhale 2 L/min into the lungs continuous.    POTASSIUM CHLORIDE SA (K-DUR) 20 MEQ TABLET    Take 20 mEq by mouth daily.   ROPINIROLE (REQUIP) 1 MG TABLET    Take 1 mg by mouth at bedtime.   SENNA (SENOKOT) 8.6 MG TABS TABLET    Take 2 tablets by mouth at bedtime.   VERAPAMIL (CALAN-SR) 180 MG CR TABLET    Take 1 tablet by mouth daily.  Modified Medications   No medications on file  Discontinued Medications   PREDNISONE (DELTASONE) 10 MG TABLET    Take three a day for three days then two a day for three days then one a day for three days, then [place] :   11-20-18: 2-d echo:   1. The left ventricle has mildly reduced systolic function, with an ejection fraction of 45-50% in the setting of atrial fibrillation. Left ventricular diastolic Doppler parameters are indeterminate.  2. Right ventricular contraction is mildly reduced. Normal estimated RVSP of 27.7 mmHg.  3. Left atrial size was moderately dilated.  4. Right atrial size was moderately dilated.  5. The aortic valve is tricuspid. Mild calcification of the aortic valve. Mild to moderate aortic annular calcification noted. Possible mild to moderate aortic regurgitation based on limited interrogation.  6. The mitral valve is normal in structure. There is moderate mitral annular calcification present. Mitral valve regurgitation is mild to moderate by color flow Doppler.  7. The tricuspid valve is normal in structure.  8. The inferior vena cava was dilated in size with >50% respiratory variability.  02-05-19: ct of head: 1. No acute intracranial abnormality. 2. Chronic ischemic microangiopathy and multiple old small vessel Infarcts.  NO NEW EXAMS.    LABS REVIEWED PREVIOUS:   11-19-18: tsh 1.196 02-05-19: wbc 7.7; hgb 10.9; hct 33.9; mcv 96.0; plt 336; glucose 127; bun 18; creat 0.85; k+ 3.7; na++ 138; ca 9.0; alk phos 180; albumin 3.2   TODAY   02-13-19: wbc 8.5; hgb 10.8; hct 34.7; mcv 96.9; plt 307; glucose 82; bun 30; creat 0.65; k+ 4.6; na++ 138; ca 8.7 iron 57 ferritin 50 vit B 12: 407   Review of Systems  Constitutional: Negative for malaise/fatigue.  Respiratory: Negative for cough and shortness of breath.   Cardiovascular: Negative for chest pain, palpitations and leg swelling.  Gastrointestinal: Negative for abdominal pain, constipation and heartburn.  Musculoskeletal: Negative for back pain, joint pain and myalgias.  Skin: Negative.   Neurological: Negative for dizziness.  Psychiatric/Behavioral: The patient is not nervous/anxious.    Physical Exam Constitutional:      General: She is not in acute distress.    Appearance: She is well-developed. She is not diaphoretic.  Neck:     Musculoskeletal: Neck supple.     Thyroid: No thyromegaly.     Comments: History of left parotidectomy  Cardiovascular:     Rate and Rhythm: Normal rate and regular rhythm.     Pulses: Normal pulses.     Heart sounds: Normal heart sounds.  Pulmonary:     Effort: Pulmonary effort is normal. No respiratory distress.     Breath sounds: Normal breath sounds.     Comments: 02 dependent  Abdominal:     General: Bowel sounds are normal. There is no distension.     Palpations: Abdomen  is soft.     Tenderness: There is no abdominal tenderness.  Musculoskeletal: Normal range of motion.     Right lower leg: No edema.     Left lower leg: No edema.     Comments: Is able to move all extremities History of bilateral hip and knee replacements History of back surgery    Lymphadenopathy:     Cervical: No cervical adenopathy.  Skin:    General: Skin is warm and dry.     Comments: Bilateral lower extremities discolored   Neurological:     Mental Status: She is alert and oriented to person, place, and time.  Psychiatric:        Mood and Affect: Mood normal.      ASSESSMENT/ PLAN:   Patient is being discharged with the following home  health services:  Pt/ot.rn.cna.sw: to evaluate and treat as indicated for gait balance strength adl training medication management adl care community resources.   Patient is being discharged with the following durable medical equipment:  None needed   Patient has been advised to f/u with their PCP in 1-2 weeks to bring them up to date on their rehab stay.  Social services at facility was responsible for arranging this appointment.  Pt was provided with a 30 day supply of prescriptions for medications and refills must be obtained from their PCP.  For controlled substances, a more limited supply may be provided adequate until PCP appointment only.   A 30 day supply of her prescription medications have been sent to [facility] with # 45 [id number] 15 mg tabs.  Time spent with patient: 40 minutes: for medications; home health needs and dme. Verbalized understanding.    Ok Edwards NP [facility] Adult [facility]  Contact [phone] Monday through Friday 8am- 5pm  After hours call [phone]

## 2019-02-18 ENCOUNTER — Encounter: Payer: Self-pay | Admitting: Family Medicine

## 2019-02-18 ENCOUNTER — Ambulatory Visit: Payer: Medicare Other | Admitting: Family Medicine

## 2019-02-18 DIAGNOSIS — J449 Chronic obstructive pulmonary disease, unspecified: Secondary | ICD-10-CM | POA: Diagnosis not present

## 2019-02-18 DIAGNOSIS — K219 Gastro-esophageal reflux disease without esophagitis: Secondary | ICD-10-CM | POA: Diagnosis not present

## 2019-02-18 DIAGNOSIS — D649 Anemia, unspecified: Secondary | ICD-10-CM | POA: Diagnosis not present

## 2019-02-18 DIAGNOSIS — Z96653 Presence of artificial knee joint, bilateral: Secondary | ICD-10-CM | POA: Diagnosis not present

## 2019-02-18 DIAGNOSIS — G2581 Restless legs syndrome: Secondary | ICD-10-CM | POA: Diagnosis not present

## 2019-02-18 DIAGNOSIS — I10 Essential (primary) hypertension: Secondary | ICD-10-CM | POA: Diagnosis not present

## 2019-02-18 DIAGNOSIS — M8589 Other specified disorders of bone density and structure, multiple sites: Secondary | ICD-10-CM | POA: Diagnosis not present

## 2019-02-18 DIAGNOSIS — Z9181 History of falling: Secondary | ICD-10-CM | POA: Diagnosis not present

## 2019-02-18 DIAGNOSIS — I482 Chronic atrial fibrillation, unspecified: Secondary | ICD-10-CM | POA: Diagnosis not present

## 2019-02-18 DIAGNOSIS — Z7901 Long term (current) use of anticoagulants: Secondary | ICD-10-CM | POA: Diagnosis not present

## 2019-02-18 DIAGNOSIS — Z96643 Presence of artificial hip joint, bilateral: Secondary | ICD-10-CM | POA: Diagnosis not present

## 2019-02-18 DIAGNOSIS — M8949 Other hypertrophic osteoarthropathy, multiple sites: Secondary | ICD-10-CM | POA: Diagnosis not present

## 2019-02-18 DIAGNOSIS — Z9981 Dependence on supplemental oxygen: Secondary | ICD-10-CM | POA: Diagnosis not present

## 2019-02-18 NOTE — Progress Notes (Signed)
No answer at [phone] at 8:25, 8:29. Pt was to be discharged from the [facility] yesterday. Erroneous visit.

## 2019-02-19 DIAGNOSIS — Z96643 Presence of artificial hip joint, bilateral: Secondary | ICD-10-CM | POA: Diagnosis not present

## 2019-02-19 DIAGNOSIS — Z9981 Dependence on supplemental oxygen: Secondary | ICD-10-CM | POA: Diagnosis not present

## 2019-02-19 DIAGNOSIS — I10 Essential (primary) hypertension: Secondary | ICD-10-CM | POA: Diagnosis not present

## 2019-02-19 DIAGNOSIS — M8589 Other specified disorders of bone density and structure, multiple sites: Secondary | ICD-10-CM | POA: Diagnosis not present

## 2019-02-19 DIAGNOSIS — D649 Anemia, unspecified: Secondary | ICD-10-CM | POA: Diagnosis not present

## 2019-02-19 DIAGNOSIS — K219 Gastro-esophageal reflux disease without esophagitis: Secondary | ICD-10-CM | POA: Diagnosis not present

## 2019-02-19 DIAGNOSIS — Z7901 Long term (current) use of anticoagulants: Secondary | ICD-10-CM | POA: Diagnosis not present

## 2019-02-19 DIAGNOSIS — I482 Chronic atrial fibrillation, unspecified: Secondary | ICD-10-CM | POA: Diagnosis not present

## 2019-02-19 DIAGNOSIS — G2581 Restless legs syndrome: Secondary | ICD-10-CM | POA: Diagnosis not present

## 2019-02-19 DIAGNOSIS — J449 Chronic obstructive pulmonary disease, unspecified: Secondary | ICD-10-CM | POA: Diagnosis not present

## 2019-02-19 DIAGNOSIS — Z9181 History of falling: Secondary | ICD-10-CM | POA: Diagnosis not present

## 2019-02-19 DIAGNOSIS — M8949 Other hypertrophic osteoarthropathy, multiple sites: Secondary | ICD-10-CM | POA: Diagnosis not present

## 2019-02-19 DIAGNOSIS — Z96653 Presence of artificial knee joint, bilateral: Secondary | ICD-10-CM | POA: Diagnosis not present

## 2019-02-20 DIAGNOSIS — Z7901 Long term (current) use of anticoagulants: Secondary | ICD-10-CM | POA: Diagnosis not present

## 2019-02-20 DIAGNOSIS — I10 Essential (primary) hypertension: Secondary | ICD-10-CM | POA: Diagnosis not present

## 2019-02-20 DIAGNOSIS — J449 Chronic obstructive pulmonary disease, unspecified: Secondary | ICD-10-CM | POA: Diagnosis not present

## 2019-02-20 DIAGNOSIS — M8949 Other hypertrophic osteoarthropathy, multiple sites: Secondary | ICD-10-CM | POA: Diagnosis not present

## 2019-02-20 DIAGNOSIS — G2581 Restless legs syndrome: Secondary | ICD-10-CM | POA: Diagnosis not present

## 2019-02-20 DIAGNOSIS — K219 Gastro-esophageal reflux disease without esophagitis: Secondary | ICD-10-CM | POA: Diagnosis not present

## 2019-02-20 DIAGNOSIS — I482 Chronic atrial fibrillation, unspecified: Secondary | ICD-10-CM | POA: Diagnosis not present

## 2019-02-20 DIAGNOSIS — Z96653 Presence of artificial knee joint, bilateral: Secondary | ICD-10-CM | POA: Diagnosis not present

## 2019-02-20 DIAGNOSIS — Z9181 History of falling: Secondary | ICD-10-CM | POA: Diagnosis not present

## 2019-02-20 DIAGNOSIS — D649 Anemia, unspecified: Secondary | ICD-10-CM | POA: Diagnosis not present

## 2019-02-20 DIAGNOSIS — Z96643 Presence of artificial hip joint, bilateral: Secondary | ICD-10-CM | POA: Diagnosis not present

## 2019-02-20 DIAGNOSIS — M8589 Other specified disorders of bone density and structure, multiple sites: Secondary | ICD-10-CM | POA: Diagnosis not present

## 2019-02-20 DIAGNOSIS — Z9981 Dependence on supplemental oxygen: Secondary | ICD-10-CM | POA: Diagnosis not present

## 2019-02-20 NOTE — Telephone Encounter (Signed)
[facility] was contacted the day of this telephone note and over the course of a few days [name] Walker was admitted for inpatient evaluation and then transferred to a SNF for care.

## 2019-02-23 ENCOUNTER — Ambulatory Visit: Payer: Medicare Other | Admitting: Family Medicine

## 2019-02-23 ENCOUNTER — Other Ambulatory Visit: Payer: Self-pay

## 2019-02-23 DIAGNOSIS — J449 Chronic obstructive pulmonary disease, unspecified: Secondary | ICD-10-CM | POA: Diagnosis not present

## 2019-02-23 DIAGNOSIS — J9601 Acute respiratory failure with hypoxia: Secondary | ICD-10-CM | POA: Diagnosis not present

## 2019-02-23 DIAGNOSIS — L03119 Cellulitis of unspecified part of limb: Secondary | ICD-10-CM | POA: Diagnosis not present

## 2019-02-24 ENCOUNTER — Other Ambulatory Visit: Payer: Self-pay

## 2019-02-24 ENCOUNTER — Encounter: Payer: Self-pay | Admitting: Family Medicine

## 2019-02-24 ENCOUNTER — Ambulatory Visit (INDEPENDENT_AMBULATORY_CARE_PROVIDER_SITE_OTHER): Payer: Medicare Other | Admitting: Family Medicine

## 2019-02-24 ENCOUNTER — Ambulatory Visit: Payer: Self-pay | Admitting: Licensed Clinical Social Worker

## 2019-02-24 DIAGNOSIS — F01518 Vascular dementia, unspecified severity, with other behavioral disturbance: Secondary | ICD-10-CM

## 2019-02-24 DIAGNOSIS — M159 Polyosteoarthritis, unspecified: Secondary | ICD-10-CM

## 2019-02-24 DIAGNOSIS — I110 Hypertensive heart disease with heart failure: Secondary | ICD-10-CM

## 2019-02-24 DIAGNOSIS — M19011 Primary osteoarthritis, right shoulder: Secondary | ICD-10-CM | POA: Diagnosis not present

## 2019-02-24 DIAGNOSIS — I5022 Chronic systolic (congestive) heart failure: Secondary | ICD-10-CM | POA: Diagnosis not present

## 2019-02-24 DIAGNOSIS — M150 Primary generalized (osteo)arthritis: Secondary | ICD-10-CM | POA: Diagnosis not present

## 2019-02-24 DIAGNOSIS — R2689 Other abnormalities of gait and mobility: Secondary | ICD-10-CM

## 2019-02-24 DIAGNOSIS — F0151 Vascular dementia with behavioral disturbance: Secondary | ICD-10-CM | POA: Diagnosis not present

## 2019-02-24 DIAGNOSIS — R296 Repeated falls: Secondary | ICD-10-CM

## 2019-02-24 NOTE — Chronic Care Management (AMB) (Signed)
  Care Management Note   [name] Walker is a 85 y.o. year old female who is a primary care patient of Stacks, Cletus Gash, MD. The CM team was consulted for assistance with chronic disease management and care coordination.   LCSW Nolon Bussing spoke today via phone with Conservation officer, nature at [facility] in [place] regarding client status and recent discharge. [name] reported to [name] that client would receive home health services with [name]. [name] said client was adamant about returning to her home. [name] said that a neighbor of client transported client on discharge from SNF. [name] said she thought that client may spend a day or two staying with neighbor for support on discharge from SNF. I reached out to Adult Protective Services of [place], [place] today and spoke with representative [name] of that agency to inform her of CSW concerns over client safety.  LCSW also spoke on 02/24/2019 with [name] , clinical case manager at [place] regarding client safety concern.  Client had been receiving care recently at [facility] in [place], [place]. She discharged from that facility on 02/17/2019 and returned to her home with home health services in place. Home health services ordered for client were [name], [name], [name], [name], and Social Work support.  Client has diagnosis of vascular dementia and lives alone.  [name] , Geriatric Nurse Practitioner, has previously consulted with LCSW and expressed concerns over client ability to live on her own. Also, [name] at [facility] has documented that she did not think that client discharging from SNF to return home was a wise decision. LCSW shared above information on client status with [name], of APS, on 02/24/2019.[name] said she was aware of case and appreciated LCSW sharing above information with APS regarding client status.    Follow Up Plan: LCSW to monitor client status and  needs over next 3 weeks.  [name].[name] MSW, LCSW Licensed Clinical Social Worker [facility] Care Management [phone]

## 2019-02-24 NOTE — Progress Notes (Signed)
Subjective:    Patient ID: [name] Walker, female    DOB: 1924/12/08, 86 y.o.   MRN: [id number]   HPI: [name] Walker is a 85 y.o. female presenting for follow up from release from rehab. Adult protective services is monitoring the situation. Rep's name is [name] Walker. Pt. Has no use of RUE. Seeking emergency care a lot from EMS. Can't  attend to ADLs. Misses meds due to inability to use her hands to open containers. She can heat things in microwave. Meals on wheels delivering weekly now. Unable to keep house straightened.  Unable to toilet herself. Can't groom herself. Can't walk over 75 feet. Can't get in and out of a car.  Granddaughter [name] Walker helping with her care. Gives history today.    Depression screen [facility] 2/9 06/04/2018 05/29/2018 05/20/2018 05/12/2018 04/24/2018  Decreased Interest 3 0 0 0 0  Down, Depressed, Hopeless 0 0 0 0 0  PHQ - 2 Score 3 0 0 0 0  Altered sleeping 0 - - - -  Tired, decreased energy 3 - - - -  Change in appetite 0 - - - -  Feeling bad or failure about yourself  0 - - - -  Trouble concentrating 0 - - - -  Moving slowly or fidgety/restless 0 - - - -  Suicidal thoughts 0 - - - -  PHQ-9 Score 6 - - - -  Difficult doing work/chores - - - - -  Some recent data might be hidden     Relevant past medical, surgical, family and social history reviewed and updated as indicated.  Interim medical history since our last visit reviewed. Allergies and medications reviewed and updated.  ROS:  Review of Systems  Unable to perform ROS: Dementia  Musculoskeletal: Positive for arthralgias (minimal use of RUE).     Social History   Tobacco Use  Smoking Status Never Smoker  Smokeless Tobacco Never Used       Objective:     Wt Readings from Last 3 Encounters:  02/17/19 163 lb 9.6 oz (74.2 kg)  02/12/19 166 lb 3.2 oz (75.4 kg)  02/09/19 166 lb 3.2 oz (75.4 kg)     Exam deferred. Pt. Harboring due to COVID 19. Phone visit performed.   Assessment & Plan:    1. Vascular dementia with behavior disturbance ([place])   2. Chronic systolic (congestive) heart failure ([place])   3. Hypertensive heart disease with chronic systolic congestive heart failure ([place])   4. Primary osteoarthritis of right shoulder   5. Primary osteoarthritis involving multiple joints   6. Imbalance   7. Frequent falls     No orders of the defined types were placed in this encounter.   No orders of the defined types were placed in this encounter.     Diagnoses and all orders for this visit:  Vascular dementia with behavior disturbance (HCC)  Chronic systolic (congestive) heart failure (HCC)  Hypertensive heart disease with chronic systolic congestive heart failure (HCC)  Primary osteoarthritis of right shoulder  Primary osteoarthritis involving multiple joints  Imbalance  Frequent falls    Virtual Visit via telephone Note  I discussed the limitations, risks, security and privacy concerns of performing an evaluation and management service by telephone and the availability of in person appointments. The patient was identified with two identifiers. Pt.expressed understanding and agreed to proceed. Pt. Is at home. Dr. Livia Snellen is in his office.  Follow Up Instructions:   I discussed the assessment and treatment  plan with the patient. The patient was provided an opportunity to ask questions and all were answered. The patient agreed with the plan and demonstrated an understanding of the instructions.   The patient was advised to call back or seek an in-person evaluation if the symptoms worsen or if the condition fails to improve as anticipated.   Total minutes including chart review and phone contact time: 34 I read Mr. Forrest's report as well. The best course would be placement, and that may still be in the offing from APS evaluation.   Follow up plan: Return in about 2 weeks (around 03/10/2019).  Claretta Fraise, MD [place]

## 2019-02-24 NOTE — Patient Instructions (Signed)
Licensed Clinical Social Worker Visit Information  Materials provided: No  LCSW International aid/development worker spoke today via phone with Conservation officer, nature at [facility] in [place] regarding client status and recent discharge. [name] reported to [name] that client would receive home health services with [facility]. [name] said client was adamant about returning to her home. [name] said that a neighbor of client transported client on discharge from SNF. [name] said she thought that client may spend a day or two staying with neighbor for support on discharge from SNF. I reached out to Adult Protective Services of [place], [place] today and spoke with representative [name] of that agency to inform her of CSW concerns over client safety.  LCSW also spoke on 02/24/2019 with [name] , clinical case manager at [place] regarding client safety concern.  Client had been receiving care recently at [facility] in [place], [place]. She discharged from that facility on 02/17/2019 and returned to her home with home health services in place. Home health services ordered for client were [place], [place], [place], [place], and Social Work support.  Client has diagnosis of vascular dementia and lives alone.  [name] , Geriatric Nurse Practitioner, has previously consulted with LCSW and expressed concerns over client ability to live on her own. Also, [name] at [facility] has documented that she did not think that client discharging from SNF to return home was a wise decision. LCSW shared above information on client status with [name], of APS, on 02/24/2019.[name] said she was aware of case and appreciated LCSW sharing above information with APS regarding client status.   Follow Up Plan: LCSW to monitor client status and needs over next 3 weeks.  LCSW was not able to speak via phone with client on 02/24/2019. Thus, the patient was not able to verbalize understanding of  instructions provided today and was not able to accept or decline a print copy of patient instruction materials.   [name].Madisun Hargrove MSW, LCSW Licensed Clinical Social Worker [facility] Care Management [phone]

## 2019-02-25 DIAGNOSIS — G2581 Restless legs syndrome: Secondary | ICD-10-CM | POA: Diagnosis not present

## 2019-02-25 DIAGNOSIS — Z96653 Presence of artificial knee joint, bilateral: Secondary | ICD-10-CM | POA: Diagnosis not present

## 2019-02-25 DIAGNOSIS — I482 Chronic atrial fibrillation, unspecified: Secondary | ICD-10-CM | POA: Diagnosis not present

## 2019-02-25 DIAGNOSIS — M8949 Other hypertrophic osteoarthropathy, multiple sites: Secondary | ICD-10-CM | POA: Diagnosis not present

## 2019-02-25 DIAGNOSIS — Z7901 Long term (current) use of anticoagulants: Secondary | ICD-10-CM | POA: Diagnosis not present

## 2019-02-25 DIAGNOSIS — I10 Essential (primary) hypertension: Secondary | ICD-10-CM | POA: Diagnosis not present

## 2019-02-25 DIAGNOSIS — M8589 Other specified disorders of bone density and structure, multiple sites: Secondary | ICD-10-CM | POA: Diagnosis not present

## 2019-02-25 DIAGNOSIS — D649 Anemia, unspecified: Secondary | ICD-10-CM | POA: Diagnosis not present

## 2019-02-25 DIAGNOSIS — Z9181 History of falling: Secondary | ICD-10-CM | POA: Diagnosis not present

## 2019-02-25 DIAGNOSIS — K219 Gastro-esophageal reflux disease without esophagitis: Secondary | ICD-10-CM | POA: Diagnosis not present

## 2019-02-25 DIAGNOSIS — J449 Chronic obstructive pulmonary disease, unspecified: Secondary | ICD-10-CM | POA: Diagnosis not present

## 2019-02-25 DIAGNOSIS — Z9981 Dependence on supplemental oxygen: Secondary | ICD-10-CM | POA: Diagnosis not present

## 2019-02-25 DIAGNOSIS — Z96643 Presence of artificial hip joint, bilateral: Secondary | ICD-10-CM | POA: Diagnosis not present

## 2019-03-02 ENCOUNTER — Telehealth: Payer: Self-pay | Admitting: Family Medicine

## 2019-03-02 NOTE — Telephone Encounter (Signed)
[place]  PT states that she needs something for pain and she is not taking her morphin anymore?? Said that dr [employer] knows her issues

## 2019-03-03 DIAGNOSIS — J449 Chronic obstructive pulmonary disease, unspecified: Secondary | ICD-10-CM | POA: Diagnosis not present

## 2019-03-03 DIAGNOSIS — I482 Chronic atrial fibrillation, unspecified: Secondary | ICD-10-CM | POA: Diagnosis not present

## 2019-03-03 DIAGNOSIS — Z9181 History of falling: Secondary | ICD-10-CM | POA: Diagnosis not present

## 2019-03-03 DIAGNOSIS — Z7901 Long term (current) use of anticoagulants: Secondary | ICD-10-CM | POA: Diagnosis not present

## 2019-03-03 DIAGNOSIS — Z96643 Presence of artificial hip joint, bilateral: Secondary | ICD-10-CM | POA: Diagnosis not present

## 2019-03-03 DIAGNOSIS — M8949 Other hypertrophic osteoarthropathy, multiple sites: Secondary | ICD-10-CM | POA: Diagnosis not present

## 2019-03-03 DIAGNOSIS — I10 Essential (primary) hypertension: Secondary | ICD-10-CM | POA: Diagnosis not present

## 2019-03-03 DIAGNOSIS — Z9981 Dependence on supplemental oxygen: Secondary | ICD-10-CM | POA: Diagnosis not present

## 2019-03-03 DIAGNOSIS — D649 Anemia, unspecified: Secondary | ICD-10-CM | POA: Diagnosis not present

## 2019-03-03 DIAGNOSIS — G2581 Restless legs syndrome: Secondary | ICD-10-CM | POA: Diagnosis not present

## 2019-03-03 DIAGNOSIS — K219 Gastro-esophageal reflux disease without esophagitis: Secondary | ICD-10-CM | POA: Diagnosis not present

## 2019-03-03 DIAGNOSIS — Z96653 Presence of artificial knee joint, bilateral: Secondary | ICD-10-CM | POA: Diagnosis not present

## 2019-03-03 DIAGNOSIS — M8589 Other specified disorders of bone density and structure, multiple sites: Secondary | ICD-10-CM | POA: Diagnosis not present

## 2019-03-03 NOTE — Telephone Encounter (Signed)
Apt scheduled.  

## 2019-03-03 NOTE — Telephone Encounter (Signed)
Pt. Needs to be seen for this. Thanks, WS 

## 2019-03-04 ENCOUNTER — Other Ambulatory Visit: Payer: Self-pay | Admitting: Family Medicine

## 2019-03-04 ENCOUNTER — Ambulatory Visit: Payer: Medicare Other | Admitting: Family Medicine

## 2019-03-04 DIAGNOSIS — M19011 Primary osteoarthritis, right shoulder: Secondary | ICD-10-CM | POA: Diagnosis not present

## 2019-03-11 DIAGNOSIS — Z9981 Dependence on supplemental oxygen: Secondary | ICD-10-CM | POA: Diagnosis not present

## 2019-03-11 DIAGNOSIS — Z96653 Presence of artificial knee joint, bilateral: Secondary | ICD-10-CM | POA: Diagnosis not present

## 2019-03-11 DIAGNOSIS — I10 Essential (primary) hypertension: Secondary | ICD-10-CM | POA: Diagnosis not present

## 2019-03-11 DIAGNOSIS — J449 Chronic obstructive pulmonary disease, unspecified: Secondary | ICD-10-CM | POA: Diagnosis not present

## 2019-03-11 DIAGNOSIS — D649 Anemia, unspecified: Secondary | ICD-10-CM | POA: Diagnosis not present

## 2019-03-11 DIAGNOSIS — Z96643 Presence of artificial hip joint, bilateral: Secondary | ICD-10-CM | POA: Diagnosis not present

## 2019-03-11 DIAGNOSIS — K219 Gastro-esophageal reflux disease without esophagitis: Secondary | ICD-10-CM | POA: Diagnosis not present

## 2019-03-11 DIAGNOSIS — M8589 Other specified disorders of bone density and structure, multiple sites: Secondary | ICD-10-CM | POA: Diagnosis not present

## 2019-03-11 DIAGNOSIS — M8949 Other hypertrophic osteoarthropathy, multiple sites: Secondary | ICD-10-CM | POA: Diagnosis not present

## 2019-03-11 DIAGNOSIS — Z7901 Long term (current) use of anticoagulants: Secondary | ICD-10-CM | POA: Diagnosis not present

## 2019-03-11 DIAGNOSIS — I482 Chronic atrial fibrillation, unspecified: Secondary | ICD-10-CM | POA: Diagnosis not present

## 2019-03-11 DIAGNOSIS — Z9181 History of falling: Secondary | ICD-10-CM | POA: Diagnosis not present

## 2019-03-11 DIAGNOSIS — G2581 Restless legs syndrome: Secondary | ICD-10-CM | POA: Diagnosis not present

## 2019-03-13 ENCOUNTER — Other Ambulatory Visit: Payer: Self-pay | Admitting: Family Medicine

## 2019-03-16 ENCOUNTER — Other Ambulatory Visit: Payer: Self-pay | Admitting: Family Medicine

## 2019-03-17 DIAGNOSIS — Z9981 Dependence on supplemental oxygen: Secondary | ICD-10-CM | POA: Diagnosis not present

## 2019-03-17 DIAGNOSIS — D649 Anemia, unspecified: Secondary | ICD-10-CM | POA: Diagnosis not present

## 2019-03-17 DIAGNOSIS — Z96643 Presence of artificial hip joint, bilateral: Secondary | ICD-10-CM | POA: Diagnosis not present

## 2019-03-17 DIAGNOSIS — Z7901 Long term (current) use of anticoagulants: Secondary | ICD-10-CM | POA: Diagnosis not present

## 2019-03-17 DIAGNOSIS — G2581 Restless legs syndrome: Secondary | ICD-10-CM | POA: Diagnosis not present

## 2019-03-17 DIAGNOSIS — M8589 Other specified disorders of bone density and structure, multiple sites: Secondary | ICD-10-CM | POA: Diagnosis not present

## 2019-03-17 DIAGNOSIS — I482 Chronic atrial fibrillation, unspecified: Secondary | ICD-10-CM | POA: Diagnosis not present

## 2019-03-17 DIAGNOSIS — M8949 Other hypertrophic osteoarthropathy, multiple sites: Secondary | ICD-10-CM | POA: Diagnosis not present

## 2019-03-17 DIAGNOSIS — I10 Essential (primary) hypertension: Secondary | ICD-10-CM | POA: Diagnosis not present

## 2019-03-17 DIAGNOSIS — J449 Chronic obstructive pulmonary disease, unspecified: Secondary | ICD-10-CM | POA: Diagnosis not present

## 2019-03-17 DIAGNOSIS — Z96653 Presence of artificial knee joint, bilateral: Secondary | ICD-10-CM | POA: Diagnosis not present

## 2019-03-17 DIAGNOSIS — Z9181 History of falling: Secondary | ICD-10-CM | POA: Diagnosis not present

## 2019-03-17 DIAGNOSIS — K219 Gastro-esophageal reflux disease without esophagitis: Secondary | ICD-10-CM | POA: Diagnosis not present

## 2019-03-18 ENCOUNTER — Ambulatory Visit (INDEPENDENT_AMBULATORY_CARE_PROVIDER_SITE_OTHER): Payer: Medicare Other

## 2019-03-18 ENCOUNTER — Other Ambulatory Visit: Payer: Self-pay

## 2019-03-18 DIAGNOSIS — Z9981 Dependence on supplemental oxygen: Secondary | ICD-10-CM | POA: Diagnosis not present

## 2019-03-18 DIAGNOSIS — M8589 Other specified disorders of bone density and structure, multiple sites: Secondary | ICD-10-CM

## 2019-03-18 DIAGNOSIS — Z7901 Long term (current) use of anticoagulants: Secondary | ICD-10-CM

## 2019-03-18 DIAGNOSIS — I10 Essential (primary) hypertension: Secondary | ICD-10-CM

## 2019-03-18 DIAGNOSIS — Z9181 History of falling: Secondary | ICD-10-CM

## 2019-03-18 DIAGNOSIS — F339 Major depressive disorder, recurrent, unspecified: Secondary | ICD-10-CM

## 2019-03-18 DIAGNOSIS — Z96643 Presence of artificial hip joint, bilateral: Secondary | ICD-10-CM | POA: Diagnosis not present

## 2019-03-18 DIAGNOSIS — G609 Hereditary and idiopathic neuropathy, unspecified: Secondary | ICD-10-CM | POA: Diagnosis not present

## 2019-03-18 DIAGNOSIS — G2581 Restless legs syndrome: Secondary | ICD-10-CM | POA: Diagnosis not present

## 2019-03-18 DIAGNOSIS — D649 Anemia, unspecified: Secondary | ICD-10-CM | POA: Diagnosis not present

## 2019-03-18 DIAGNOSIS — J449 Chronic obstructive pulmonary disease, unspecified: Secondary | ICD-10-CM | POA: Diagnosis not present

## 2019-03-18 DIAGNOSIS — M8949 Other hypertrophic osteoarthropathy, multiple sites: Secondary | ICD-10-CM

## 2019-03-18 DIAGNOSIS — I482 Chronic atrial fibrillation, unspecified: Secondary | ICD-10-CM

## 2019-03-18 DIAGNOSIS — Z96653 Presence of artificial knee joint, bilateral: Secondary | ICD-10-CM | POA: Diagnosis not present

## 2019-03-18 DIAGNOSIS — K219 Gastro-esophageal reflux disease without esophagitis: Secondary | ICD-10-CM | POA: Diagnosis not present

## 2019-03-19 ENCOUNTER — Telehealth: Payer: Self-pay | Admitting: *Deleted

## 2019-03-19 MED ORDER — METOLAZONE 10 MG PO TABS: 10.0000 mg | ORAL_TABLET | Freq: Every day | ORAL | 0 refills | Status: DC

## 2019-03-19 NOTE — Telephone Encounter (Signed)
Can do Zaroxolyn 10 mg daily for 3 days. Ok to order follow up for social work. If she continues to have edema and wheezing, she needs to be seen in office or taken to ED. If I need to do anything further, please let me know.
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
## 2019-03-19 NOTE — Telephone Encounter (Signed)
Med sent to [facility] pharm   Attempted to call pt as well = to let her know about med - NA / no VM

## 2019-03-20 ENCOUNTER — Other Ambulatory Visit: Payer: Self-pay | Admitting: Family Medicine

## 2019-03-20 DIAGNOSIS — Z7901 Long term (current) use of anticoagulants: Secondary | ICD-10-CM | POA: Diagnosis not present

## 2019-03-20 DIAGNOSIS — Z96653 Presence of artificial knee joint, bilateral: Secondary | ICD-10-CM | POA: Diagnosis not present

## 2019-03-20 DIAGNOSIS — K219 Gastro-esophageal reflux disease without esophagitis: Secondary | ICD-10-CM | POA: Diagnosis not present

## 2019-03-20 DIAGNOSIS — I10 Essential (primary) hypertension: Secondary | ICD-10-CM | POA: Diagnosis not present

## 2019-03-20 DIAGNOSIS — I482 Chronic atrial fibrillation, unspecified: Secondary | ICD-10-CM | POA: Diagnosis not present

## 2019-03-20 DIAGNOSIS — J449 Chronic obstructive pulmonary disease, unspecified: Secondary | ICD-10-CM | POA: Diagnosis not present

## 2019-03-20 DIAGNOSIS — D649 Anemia, unspecified: Secondary | ICD-10-CM | POA: Diagnosis not present

## 2019-03-20 DIAGNOSIS — M8949 Other hypertrophic osteoarthropathy, multiple sites: Secondary | ICD-10-CM | POA: Diagnosis not present

## 2019-03-20 DIAGNOSIS — Z9981 Dependence on supplemental oxygen: Secondary | ICD-10-CM | POA: Diagnosis not present

## 2019-03-20 DIAGNOSIS — M8589 Other specified disorders of bone density and structure, multiple sites: Secondary | ICD-10-CM | POA: Diagnosis not present

## 2019-03-20 DIAGNOSIS — Z9181 History of falling: Secondary | ICD-10-CM | POA: Diagnosis not present

## 2019-03-20 DIAGNOSIS — G2581 Restless legs syndrome: Secondary | ICD-10-CM | POA: Diagnosis not present

## 2019-03-20 DIAGNOSIS — Z96643 Presence of artificial hip joint, bilateral: Secondary | ICD-10-CM | POA: Diagnosis not present

## 2019-03-20 NOTE — Telephone Encounter (Signed)
Please contact the patient . Let her know that zaroxyln has been sent in. MAy have to call emergency contact or [facility] nurse.

## 2019-03-24 DIAGNOSIS — D649 Anemia, unspecified: Secondary | ICD-10-CM | POA: Diagnosis not present

## 2019-03-24 DIAGNOSIS — M8949 Other hypertrophic osteoarthropathy, multiple sites: Secondary | ICD-10-CM | POA: Diagnosis not present

## 2019-03-24 DIAGNOSIS — I10 Essential (primary) hypertension: Secondary | ICD-10-CM | POA: Diagnosis not present

## 2019-03-24 DIAGNOSIS — K219 Gastro-esophageal reflux disease without esophagitis: Secondary | ICD-10-CM | POA: Diagnosis not present

## 2019-03-24 DIAGNOSIS — Z9181 History of falling: Secondary | ICD-10-CM | POA: Diagnosis not present

## 2019-03-24 DIAGNOSIS — I482 Chronic atrial fibrillation, unspecified: Secondary | ICD-10-CM | POA: Diagnosis not present

## 2019-03-24 DIAGNOSIS — Z7901 Long term (current) use of anticoagulants: Secondary | ICD-10-CM | POA: Diagnosis not present

## 2019-03-24 DIAGNOSIS — G2581 Restless legs syndrome: Secondary | ICD-10-CM | POA: Diagnosis not present

## 2019-03-24 DIAGNOSIS — Z96653 Presence of artificial knee joint, bilateral: Secondary | ICD-10-CM | POA: Diagnosis not present

## 2019-03-24 DIAGNOSIS — Z9981 Dependence on supplemental oxygen: Secondary | ICD-10-CM | POA: Diagnosis not present

## 2019-03-24 DIAGNOSIS — Z96643 Presence of artificial hip joint, bilateral: Secondary | ICD-10-CM | POA: Diagnosis not present

## 2019-03-24 DIAGNOSIS — M8589 Other specified disorders of bone density and structure, multiple sites: Secondary | ICD-10-CM | POA: Diagnosis not present

## 2019-03-24 DIAGNOSIS — J449 Chronic obstructive pulmonary disease, unspecified: Secondary | ICD-10-CM | POA: Diagnosis not present

## 2019-03-25 DIAGNOSIS — K219 Gastro-esophageal reflux disease without esophagitis: Secondary | ICD-10-CM | POA: Diagnosis not present

## 2019-03-25 DIAGNOSIS — Z9981 Dependence on supplemental oxygen: Secondary | ICD-10-CM | POA: Diagnosis not present

## 2019-03-25 DIAGNOSIS — I482 Chronic atrial fibrillation, unspecified: Secondary | ICD-10-CM | POA: Diagnosis not present

## 2019-03-25 DIAGNOSIS — Z7901 Long term (current) use of anticoagulants: Secondary | ICD-10-CM | POA: Diagnosis not present

## 2019-03-25 DIAGNOSIS — L03119 Cellulitis of unspecified part of limb: Secondary | ICD-10-CM | POA: Diagnosis not present

## 2019-03-25 DIAGNOSIS — M8589 Other specified disorders of bone density and structure, multiple sites: Secondary | ICD-10-CM | POA: Diagnosis not present

## 2019-03-25 DIAGNOSIS — D649 Anemia, unspecified: Secondary | ICD-10-CM | POA: Diagnosis not present

## 2019-03-25 DIAGNOSIS — I10 Essential (primary) hypertension: Secondary | ICD-10-CM | POA: Diagnosis not present

## 2019-03-25 DIAGNOSIS — M8949 Other hypertrophic osteoarthropathy, multiple sites: Secondary | ICD-10-CM | POA: Diagnosis not present

## 2019-03-25 DIAGNOSIS — Z96643 Presence of artificial hip joint, bilateral: Secondary | ICD-10-CM | POA: Diagnosis not present

## 2019-03-25 DIAGNOSIS — Z9181 History of falling: Secondary | ICD-10-CM | POA: Diagnosis not present

## 2019-03-25 DIAGNOSIS — Z96653 Presence of artificial knee joint, bilateral: Secondary | ICD-10-CM | POA: Diagnosis not present

## 2019-03-25 DIAGNOSIS — G2581 Restless legs syndrome: Secondary | ICD-10-CM | POA: Diagnosis not present

## 2019-03-25 DIAGNOSIS — J9601 Acute respiratory failure with hypoxia: Secondary | ICD-10-CM | POA: Diagnosis not present

## 2019-03-25 DIAGNOSIS — J449 Chronic obstructive pulmonary disease, unspecified: Secondary | ICD-10-CM | POA: Diagnosis not present

## 2019-03-26 DIAGNOSIS — Z9181 History of falling: Secondary | ICD-10-CM | POA: Diagnosis not present

## 2019-03-26 DIAGNOSIS — M8949 Other hypertrophic osteoarthropathy, multiple sites: Secondary | ICD-10-CM | POA: Diagnosis not present

## 2019-03-26 DIAGNOSIS — I10 Essential (primary) hypertension: Secondary | ICD-10-CM | POA: Diagnosis not present

## 2019-03-26 DIAGNOSIS — Z96653 Presence of artificial knee joint, bilateral: Secondary | ICD-10-CM | POA: Diagnosis not present

## 2019-03-26 DIAGNOSIS — I482 Chronic atrial fibrillation, unspecified: Secondary | ICD-10-CM | POA: Diagnosis not present

## 2019-03-26 DIAGNOSIS — J449 Chronic obstructive pulmonary disease, unspecified: Secondary | ICD-10-CM | POA: Diagnosis not present

## 2019-03-26 DIAGNOSIS — Z9981 Dependence on supplemental oxygen: Secondary | ICD-10-CM | POA: Diagnosis not present

## 2019-03-26 DIAGNOSIS — D649 Anemia, unspecified: Secondary | ICD-10-CM | POA: Diagnosis not present

## 2019-03-26 DIAGNOSIS — Z7901 Long term (current) use of anticoagulants: Secondary | ICD-10-CM | POA: Diagnosis not present

## 2019-03-26 DIAGNOSIS — K219 Gastro-esophageal reflux disease without esophagitis: Secondary | ICD-10-CM | POA: Diagnosis not present

## 2019-03-26 DIAGNOSIS — M8589 Other specified disorders of bone density and structure, multiple sites: Secondary | ICD-10-CM | POA: Diagnosis not present

## 2019-03-26 DIAGNOSIS — Z96643 Presence of artificial hip joint, bilateral: Secondary | ICD-10-CM | POA: Diagnosis not present

## 2019-03-26 DIAGNOSIS — G2581 Restless legs syndrome: Secondary | ICD-10-CM | POA: Diagnosis not present

## 2019-03-30 DIAGNOSIS — Z9981 Dependence on supplemental oxygen: Secondary | ICD-10-CM | POA: Diagnosis not present

## 2019-03-30 DIAGNOSIS — I482 Chronic atrial fibrillation, unspecified: Secondary | ICD-10-CM | POA: Diagnosis not present

## 2019-03-30 DIAGNOSIS — Z96643 Presence of artificial hip joint, bilateral: Secondary | ICD-10-CM | POA: Diagnosis not present

## 2019-03-30 DIAGNOSIS — Z96653 Presence of artificial knee joint, bilateral: Secondary | ICD-10-CM | POA: Diagnosis not present

## 2019-03-30 DIAGNOSIS — M8949 Other hypertrophic osteoarthropathy, multiple sites: Secondary | ICD-10-CM | POA: Diagnosis not present

## 2019-03-30 DIAGNOSIS — K219 Gastro-esophageal reflux disease without esophagitis: Secondary | ICD-10-CM | POA: Diagnosis not present

## 2019-03-30 DIAGNOSIS — Z9181 History of falling: Secondary | ICD-10-CM | POA: Diagnosis not present

## 2019-03-30 DIAGNOSIS — G2581 Restless legs syndrome: Secondary | ICD-10-CM | POA: Diagnosis not present

## 2019-03-30 DIAGNOSIS — J449 Chronic obstructive pulmonary disease, unspecified: Secondary | ICD-10-CM | POA: Diagnosis not present

## 2019-03-30 DIAGNOSIS — I10 Essential (primary) hypertension: Secondary | ICD-10-CM | POA: Diagnosis not present

## 2019-03-30 DIAGNOSIS — Z7901 Long term (current) use of anticoagulants: Secondary | ICD-10-CM | POA: Diagnosis not present

## 2019-03-30 DIAGNOSIS — M8589 Other specified disorders of bone density and structure, multiple sites: Secondary | ICD-10-CM | POA: Diagnosis not present

## 2019-03-30 DIAGNOSIS — D649 Anemia, unspecified: Secondary | ICD-10-CM | POA: Diagnosis not present

## 2019-04-02 DIAGNOSIS — G2581 Restless legs syndrome: Secondary | ICD-10-CM | POA: Diagnosis not present

## 2019-04-02 DIAGNOSIS — J449 Chronic obstructive pulmonary disease, unspecified: Secondary | ICD-10-CM | POA: Diagnosis not present

## 2019-04-02 DIAGNOSIS — M8949 Other hypertrophic osteoarthropathy, multiple sites: Secondary | ICD-10-CM | POA: Diagnosis not present

## 2019-04-02 DIAGNOSIS — I10 Essential (primary) hypertension: Secondary | ICD-10-CM | POA: Diagnosis not present

## 2019-04-02 DIAGNOSIS — Z9181 History of falling: Secondary | ICD-10-CM | POA: Diagnosis not present

## 2019-04-02 DIAGNOSIS — Z96653 Presence of artificial knee joint, bilateral: Secondary | ICD-10-CM | POA: Diagnosis not present

## 2019-04-02 DIAGNOSIS — Z96643 Presence of artificial hip joint, bilateral: Secondary | ICD-10-CM | POA: Diagnosis not present

## 2019-04-02 DIAGNOSIS — K219 Gastro-esophageal reflux disease without esophagitis: Secondary | ICD-10-CM | POA: Diagnosis not present

## 2019-04-02 DIAGNOSIS — Z9981 Dependence on supplemental oxygen: Secondary | ICD-10-CM | POA: Diagnosis not present

## 2019-04-02 DIAGNOSIS — M8589 Other specified disorders of bone density and structure, multiple sites: Secondary | ICD-10-CM | POA: Diagnosis not present

## 2019-04-02 DIAGNOSIS — I482 Chronic atrial fibrillation, unspecified: Secondary | ICD-10-CM | POA: Diagnosis not present

## 2019-04-02 DIAGNOSIS — Z7901 Long term (current) use of anticoagulants: Secondary | ICD-10-CM | POA: Diagnosis not present

## 2019-04-02 DIAGNOSIS — D649 Anemia, unspecified: Secondary | ICD-10-CM | POA: Diagnosis not present

## 2019-04-06 DIAGNOSIS — Z96653 Presence of artificial knee joint, bilateral: Secondary | ICD-10-CM | POA: Diagnosis not present

## 2019-04-06 DIAGNOSIS — Z9981 Dependence on supplemental oxygen: Secondary | ICD-10-CM | POA: Diagnosis not present

## 2019-04-06 DIAGNOSIS — Z7901 Long term (current) use of anticoagulants: Secondary | ICD-10-CM | POA: Diagnosis not present

## 2019-04-06 DIAGNOSIS — I482 Chronic atrial fibrillation, unspecified: Secondary | ICD-10-CM | POA: Diagnosis not present

## 2019-04-06 DIAGNOSIS — Z9181 History of falling: Secondary | ICD-10-CM | POA: Diagnosis not present

## 2019-04-06 DIAGNOSIS — Z96643 Presence of artificial hip joint, bilateral: Secondary | ICD-10-CM | POA: Diagnosis not present

## 2019-04-06 DIAGNOSIS — G2581 Restless legs syndrome: Secondary | ICD-10-CM | POA: Diagnosis not present

## 2019-04-06 DIAGNOSIS — I10 Essential (primary) hypertension: Secondary | ICD-10-CM | POA: Diagnosis not present

## 2019-04-06 DIAGNOSIS — D649 Anemia, unspecified: Secondary | ICD-10-CM | POA: Diagnosis not present

## 2019-04-06 DIAGNOSIS — M8589 Other specified disorders of bone density and structure, multiple sites: Secondary | ICD-10-CM | POA: Diagnosis not present

## 2019-04-06 DIAGNOSIS — J449 Chronic obstructive pulmonary disease, unspecified: Secondary | ICD-10-CM | POA: Diagnosis not present

## 2019-04-06 DIAGNOSIS — K219 Gastro-esophageal reflux disease without esophagitis: Secondary | ICD-10-CM | POA: Diagnosis not present

## 2019-04-06 DIAGNOSIS — M8949 Other hypertrophic osteoarthropathy, multiple sites: Secondary | ICD-10-CM | POA: Diagnosis not present

## 2019-04-15 DIAGNOSIS — M8949 Other hypertrophic osteoarthropathy, multiple sites: Secondary | ICD-10-CM | POA: Diagnosis not present

## 2019-04-15 DIAGNOSIS — Z7901 Long term (current) use of anticoagulants: Secondary | ICD-10-CM | POA: Diagnosis not present

## 2019-04-15 DIAGNOSIS — I10 Essential (primary) hypertension: Secondary | ICD-10-CM | POA: Diagnosis not present

## 2019-04-15 DIAGNOSIS — Z9981 Dependence on supplemental oxygen: Secondary | ICD-10-CM | POA: Diagnosis not present

## 2019-04-15 DIAGNOSIS — Z9181 History of falling: Secondary | ICD-10-CM | POA: Diagnosis not present

## 2019-04-15 DIAGNOSIS — I482 Chronic atrial fibrillation, unspecified: Secondary | ICD-10-CM | POA: Diagnosis not present

## 2019-04-15 DIAGNOSIS — D649 Anemia, unspecified: Secondary | ICD-10-CM | POA: Diagnosis not present

## 2019-04-15 DIAGNOSIS — Z96643 Presence of artificial hip joint, bilateral: Secondary | ICD-10-CM | POA: Diagnosis not present

## 2019-04-15 DIAGNOSIS — J449 Chronic obstructive pulmonary disease, unspecified: Secondary | ICD-10-CM | POA: Diagnosis not present

## 2019-04-15 DIAGNOSIS — Z96653 Presence of artificial knee joint, bilateral: Secondary | ICD-10-CM | POA: Diagnosis not present

## 2019-04-15 DIAGNOSIS — G2581 Restless legs syndrome: Secondary | ICD-10-CM | POA: Diagnosis not present

## 2019-04-15 DIAGNOSIS — K219 Gastro-esophageal reflux disease without esophagitis: Secondary | ICD-10-CM | POA: Diagnosis not present

## 2019-04-15 DIAGNOSIS — M8589 Other specified disorders of bone density and structure, multiple sites: Secondary | ICD-10-CM | POA: Diagnosis not present

## 2019-04-23 ENCOUNTER — Telehealth: Payer: Self-pay | Admitting: Family Medicine

## 2019-04-23 NOTE — Telephone Encounter (Signed)
Advised patient that Dr. Livia Snellen will be out of the office until Monday 8/3.  Patient states she can not wait this long and can not come into the office due to not having transportation.

## 2019-04-24 NOTE — Telephone Encounter (Signed)
NTBS.

## 2019-04-24 NOTE — Telephone Encounter (Signed)
Patient aware.

## 2019-04-25 DIAGNOSIS — J9601 Acute respiratory failure with hypoxia: Secondary | ICD-10-CM | POA: Diagnosis not present

## 2019-04-25 DIAGNOSIS — J449 Chronic obstructive pulmonary disease, unspecified: Secondary | ICD-10-CM | POA: Diagnosis not present

## 2019-04-25 DIAGNOSIS — L03119 Cellulitis of unspecified part of limb: Secondary | ICD-10-CM | POA: Diagnosis not present

## 2019-04-27 ENCOUNTER — Ambulatory Visit: Payer: Medicare Other | Admitting: Family Medicine

## 2019-04-27 ENCOUNTER — Other Ambulatory Visit: Payer: Self-pay | Admitting: Adult Health

## 2019-04-28 ENCOUNTER — Encounter: Payer: Self-pay | Admitting: Family Medicine

## 2019-04-28 ENCOUNTER — Telehealth: Payer: Self-pay | Admitting: Family Medicine

## 2019-04-28 ENCOUNTER — Other Ambulatory Visit: Payer: Self-pay | Admitting: Adult Health

## 2019-04-28 ENCOUNTER — Ambulatory Visit (INDEPENDENT_AMBULATORY_CARE_PROVIDER_SITE_OTHER): Payer: Medicare Other | Admitting: Family Medicine

## 2019-04-28 ENCOUNTER — Ambulatory Visit: Payer: Medicare Other | Admitting: Family Medicine

## 2019-04-28 DIAGNOSIS — Z7951 Long term (current) use of inhaled steroids: Secondary | ICD-10-CM | POA: Diagnosis not present

## 2019-04-28 DIAGNOSIS — M1991 Primary osteoarthritis, unspecified site: Secondary | ICD-10-CM | POA: Diagnosis not present

## 2019-04-28 DIAGNOSIS — M19011 Primary osteoarthritis, right shoulder: Secondary | ICD-10-CM | POA: Diagnosis not present

## 2019-04-28 DIAGNOSIS — Z7901 Long term (current) use of anticoagulants: Secondary | ICD-10-CM | POA: Diagnosis not present

## 2019-04-28 DIAGNOSIS — R2689 Other abnormalities of gait and mobility: Secondary | ICD-10-CM | POA: Diagnosis not present

## 2019-04-28 DIAGNOSIS — I5022 Chronic systolic (congestive) heart failure: Secondary | ICD-10-CM | POA: Diagnosis not present

## 2019-04-28 DIAGNOSIS — R296 Repeated falls: Secondary | ICD-10-CM | POA: Diagnosis not present

## 2019-04-28 DIAGNOSIS — I110 Hypertensive heart disease with heart failure: Secondary | ICD-10-CM | POA: Diagnosis not present

## 2019-04-28 DIAGNOSIS — Z7952 Long term (current) use of systemic steroids: Secondary | ICD-10-CM | POA: Diagnosis not present

## 2019-04-28 NOTE — Progress Notes (Signed)
erroneous

## 2019-04-28 NOTE — Telephone Encounter (Signed)
[name] Walker took the call

## 2019-04-28 NOTE — Progress Notes (Signed)
Unable to perform video visit. Pt does not have MyChart and nurse was on an iPhone and provider has android phone and could not do video visit. Nurse will reschedule appointment.

## 2019-04-29 ENCOUNTER — Other Ambulatory Visit: Payer: Self-pay | Admitting: Family Medicine

## 2019-04-29 ENCOUNTER — Other Ambulatory Visit: Payer: Self-pay | Admitting: *Deleted

## 2019-04-29 DIAGNOSIS — R2689 Other abnormalities of gait and mobility: Secondary | ICD-10-CM

## 2019-04-29 DIAGNOSIS — I110 Hypertensive heart disease with heart failure: Secondary | ICD-10-CM

## 2019-04-29 DIAGNOSIS — M1991 Primary osteoarthritis, unspecified site: Secondary | ICD-10-CM | POA: Diagnosis not present

## 2019-04-29 DIAGNOSIS — I5022 Chronic systolic (congestive) heart failure: Secondary | ICD-10-CM | POA: Diagnosis not present

## 2019-04-29 DIAGNOSIS — F339 Major depressive disorder, recurrent, unspecified: Secondary | ICD-10-CM

## 2019-04-29 DIAGNOSIS — Z7901 Long term (current) use of anticoagulants: Secondary | ICD-10-CM | POA: Diagnosis not present

## 2019-04-29 DIAGNOSIS — Z7952 Long term (current) use of systemic steroids: Secondary | ICD-10-CM | POA: Diagnosis not present

## 2019-04-29 DIAGNOSIS — Z7951 Long term (current) use of inhaled steroids: Secondary | ICD-10-CM | POA: Diagnosis not present

## 2019-04-29 DIAGNOSIS — R296 Repeated falls: Secondary | ICD-10-CM

## 2019-04-29 DIAGNOSIS — M19011 Primary osteoarthritis, right shoulder: Secondary | ICD-10-CM | POA: Diagnosis not present

## 2019-04-29 DIAGNOSIS — J449 Chronic obstructive pulmonary disease, unspecified: Secondary | ICD-10-CM

## 2019-04-29 DIAGNOSIS — R627 Adult failure to thrive: Secondary | ICD-10-CM

## 2019-04-29 MED ORDER — DONEPEZIL HCL 10 MG PO TABS: 10.0000 mg | ORAL_TABLET | Freq: Every day | ORAL | 0 refills | Status: DC

## 2019-05-01 ENCOUNTER — Encounter: Payer: Self-pay | Admitting: Family

## 2019-05-01 ENCOUNTER — Ambulatory Visit (INDEPENDENT_AMBULATORY_CARE_PROVIDER_SITE_OTHER): Payer: Medicare Other | Admitting: Family

## 2019-05-01 DIAGNOSIS — R238 Other skin changes: Secondary | ICD-10-CM

## 2019-05-01 DIAGNOSIS — L909 Atrophic disorder of skin, unspecified: Secondary | ICD-10-CM | POA: Diagnosis not present

## 2019-05-01 DIAGNOSIS — M19011 Primary osteoarthritis, right shoulder: Secondary | ICD-10-CM | POA: Diagnosis not present

## 2019-05-01 DIAGNOSIS — Z7901 Long term (current) use of anticoagulants: Secondary | ICD-10-CM | POA: Diagnosis not present

## 2019-05-01 DIAGNOSIS — R2689 Other abnormalities of gait and mobility: Secondary | ICD-10-CM | POA: Diagnosis not present

## 2019-05-01 DIAGNOSIS — F411 Generalized anxiety disorder: Secondary | ICD-10-CM | POA: Diagnosis not present

## 2019-05-01 DIAGNOSIS — Z7951 Long term (current) use of inhaled steroids: Secondary | ICD-10-CM | POA: Diagnosis not present

## 2019-05-01 DIAGNOSIS — F339 Major depressive disorder, recurrent, unspecified: Secondary | ICD-10-CM | POA: Diagnosis not present

## 2019-05-01 DIAGNOSIS — Z7952 Long term (current) use of systemic steroids: Secondary | ICD-10-CM | POA: Diagnosis not present

## 2019-05-01 DIAGNOSIS — I110 Hypertensive heart disease with heart failure: Secondary | ICD-10-CM | POA: Diagnosis not present

## 2019-05-01 DIAGNOSIS — M1991 Primary osteoarthritis, unspecified site: Secondary | ICD-10-CM | POA: Diagnosis not present

## 2019-05-01 DIAGNOSIS — R296 Repeated falls: Secondary | ICD-10-CM | POA: Diagnosis not present

## 2019-05-01 DIAGNOSIS — I5022 Chronic systolic (congestive) heart failure: Secondary | ICD-10-CM | POA: Diagnosis not present

## 2019-05-01 MED ORDER — AMITRIPTYLINE HCL 150 MG PO TABS: 150.0000 mg | ORAL_TABLET | Freq: Every day | ORAL | 1 refills | Status: DC

## 2019-05-01 MED ORDER — CALMOSEPTINE 0.44-20.6 % EX OINT: TOPICAL_OINTMENT | CUTANEOUS | 2 refills | Status: AC

## 2019-05-01 NOTE — Progress Notes (Signed)
Virtual Visit via telephone Note Due to COVID-19 pandemic this visit was conducted virtually. This visit type was conducted due to national recommendations for restrictions regarding the COVID-19 Pandemic (e.g. social distancing, sheltering in place) in an effort to limit this patient's exposure and mitigate transmission in our community. All issues noted in this document were discussed and addressed.  A physical exam was not performed with this format.  I connected with [name] Walker on 05/01/19 at 11:06 AM  by telephone and verified that I am speaking with the correct person using two identifiers. [name] Walker is currently located at home  and nurse  is currently with her during visit. The provider, Evelina Dun, FNP is located in their office at time of visit.  I discussed the limitations, risks, security and privacy concerns of performing an evaluation and management service by telephone and the availability of in person appointments. I also discussed with the patient that there may be a patient responsible charge related to this service. The patient expressed understanding and agreed to proceed.   History and Present Illness:  PT calls the office today with increased anxiety and depression at night time. She states about three weeks ago she noticed she could not sleep well and felt more anxious. She lives alone and stays by herself at home. She can not think of anything that has changed over the last few weeks.   PT current taking amitriptyline 75 mg every night.   She does having bilateral erythemas on her buttocks. She does lay in the bed for hours at a time.  Depression        This is a chronic problem.  The current episode started more than 1 year ago.   The onset quality is gradual.   The problem occurs intermittently.  Associated symptoms include decreased concentration, helplessness, insomnia, irritable, restlessness, decreased interest and sad.  Associated symptoms  include no hopelessness.  Compliance with treatment is good.  Past medical history includes anxiety.   Anxiety Presents for follow-up visit. Symptoms include decreased concentration, depressed mood, excessive worry, insomnia, irritability, nervous/anxious behavior and restlessness. Symptoms occur constantly.       Review of Systems  Constitutional: Positive for irritability.  Psychiatric/Behavioral: Positive for decreased concentration and depression. The patient is nervous/anxious and has insomnia.      Observations/Objective: No SOB or distress noted   Assessment and Plan: 1. Depression, recurrent ([place]) Will increase Amitriptyline to 150 mg from 75 mg to see if this helps with anxiety and restlessness Stress management  Fall prevention discussed - amitriptyline (ELAVIL) 150 MG tablet; Take 1 tablet (150 mg total) by mouth at bedtime.  Dispense: 90 tablet; Refill: 1  2. Skin breakdown Turn every two hours and position with pillows as needed - Menthol-Zinc Oxide (CALMOSEPTINE) 0.44-20.6 % OINT; Apply to buttocks TID  Dispense: 113 g; Refill: 2  3. GAD (generalized anxiety disorder) - amitriptyline (ELAVIL) 150 MG tablet; Take 1 tablet (150 mg total) by mouth at bedtime.  Dispense: 90 tablet; Refill: 1  Keep follow up with PCP   I discussed the assessment and treatment plan with the patient. The patient was provided an opportunity to ask questions and all were answered. The patient agreed with the plan and demonstrated an understanding of the instructions.   The patient was advised to call back or seek an in-person evaluation if the symptoms worsen or if the condition fails to improve as anticipated.  The above assessment and management plan was  discussed with the patient. The patient verbalized understanding of and has agreed to the management plan. Patient is aware to call the clinic if symptoms persist or worsen. Patient is aware when to return to the clinic for a follow-up  visit. Patient educated on when it is appropriate to go to the emergency department.   Time call ended:  11:28 AM   I provided 22 minutes of non-face-to-face time during this encounter.    Evelina Dun, FNP

## 2019-05-04 DIAGNOSIS — M19011 Primary osteoarthritis, right shoulder: Secondary | ICD-10-CM | POA: Diagnosis not present

## 2019-05-04 DIAGNOSIS — M1991 Primary osteoarthritis, unspecified site: Secondary | ICD-10-CM | POA: Diagnosis not present

## 2019-05-04 DIAGNOSIS — R296 Repeated falls: Secondary | ICD-10-CM | POA: Diagnosis not present

## 2019-05-04 DIAGNOSIS — Z7951 Long term (current) use of inhaled steroids: Secondary | ICD-10-CM | POA: Diagnosis not present

## 2019-05-04 DIAGNOSIS — Z7901 Long term (current) use of anticoagulants: Secondary | ICD-10-CM | POA: Diagnosis not present

## 2019-05-04 DIAGNOSIS — I110 Hypertensive heart disease with heart failure: Secondary | ICD-10-CM | POA: Diagnosis not present

## 2019-05-04 DIAGNOSIS — R2689 Other abnormalities of gait and mobility: Secondary | ICD-10-CM | POA: Diagnosis not present

## 2019-05-04 DIAGNOSIS — I5022 Chronic systolic (congestive) heart failure: Secondary | ICD-10-CM | POA: Diagnosis not present

## 2019-05-04 DIAGNOSIS — Z7952 Long term (current) use of systemic steroids: Secondary | ICD-10-CM | POA: Diagnosis not present

## 2019-05-05 DIAGNOSIS — M1991 Primary osteoarthritis, unspecified site: Secondary | ICD-10-CM | POA: Diagnosis not present

## 2019-05-05 DIAGNOSIS — R296 Repeated falls: Secondary | ICD-10-CM | POA: Diagnosis not present

## 2019-05-05 DIAGNOSIS — Z7952 Long term (current) use of systemic steroids: Secondary | ICD-10-CM | POA: Diagnosis not present

## 2019-05-05 DIAGNOSIS — I110 Hypertensive heart disease with heart failure: Secondary | ICD-10-CM | POA: Diagnosis not present

## 2019-05-05 DIAGNOSIS — I5022 Chronic systolic (congestive) heart failure: Secondary | ICD-10-CM | POA: Diagnosis not present

## 2019-05-05 DIAGNOSIS — Z7951 Long term (current) use of inhaled steroids: Secondary | ICD-10-CM | POA: Diagnosis not present

## 2019-05-05 DIAGNOSIS — Z7901 Long term (current) use of anticoagulants: Secondary | ICD-10-CM | POA: Diagnosis not present

## 2019-05-05 DIAGNOSIS — M19011 Primary osteoarthritis, right shoulder: Secondary | ICD-10-CM | POA: Diagnosis not present

## 2019-05-05 DIAGNOSIS — R2689 Other abnormalities of gait and mobility: Secondary | ICD-10-CM | POA: Diagnosis not present

## 2019-05-06 ENCOUNTER — Ambulatory Visit: Payer: Medicare Other | Admitting: Family Medicine

## 2019-05-06 ENCOUNTER — Ambulatory Visit (INDEPENDENT_AMBULATORY_CARE_PROVIDER_SITE_OTHER): Payer: Medicare Other | Admitting: Family Medicine

## 2019-05-06 ENCOUNTER — Encounter: Payer: Self-pay | Admitting: Family Medicine

## 2019-05-06 DIAGNOSIS — F0151 Vascular dementia with behavioral disturbance: Secondary | ICD-10-CM

## 2019-05-06 DIAGNOSIS — J439 Emphysema, unspecified: Secondary | ICD-10-CM | POA: Diagnosis not present

## 2019-05-06 DIAGNOSIS — I110 Hypertensive heart disease with heart failure: Secondary | ICD-10-CM | POA: Diagnosis not present

## 2019-05-06 DIAGNOSIS — Z7951 Long term (current) use of inhaled steroids: Secondary | ICD-10-CM | POA: Diagnosis not present

## 2019-05-06 DIAGNOSIS — F01518 Vascular dementia, unspecified severity, with other behavioral disturbance: Secondary | ICD-10-CM

## 2019-05-06 DIAGNOSIS — Z7901 Long term (current) use of anticoagulants: Secondary | ICD-10-CM | POA: Diagnosis not present

## 2019-05-06 DIAGNOSIS — M1991 Primary osteoarthritis, unspecified site: Secondary | ICD-10-CM | POA: Diagnosis not present

## 2019-05-06 DIAGNOSIS — R296 Repeated falls: Secondary | ICD-10-CM | POA: Diagnosis not present

## 2019-05-06 DIAGNOSIS — I5022 Chronic systolic (congestive) heart failure: Secondary | ICD-10-CM

## 2019-05-06 DIAGNOSIS — Z7952 Long term (current) use of systemic steroids: Secondary | ICD-10-CM | POA: Diagnosis not present

## 2019-05-06 DIAGNOSIS — R2689 Other abnormalities of gait and mobility: Secondary | ICD-10-CM | POA: Diagnosis not present

## 2019-05-06 DIAGNOSIS — M19011 Primary osteoarthritis, right shoulder: Secondary | ICD-10-CM | POA: Diagnosis not present

## 2019-05-06 NOTE — Progress Notes (Signed)
    Subjective:    Patient ID: [name] Walker, female    DOB: 15-Apr-1925, 85 y.o.   MRN: [id number]   HPI: [name] Walker is a 85 y.o. female presenting for depression. She says she insists on staying in her home. I was unable to reach the home health nurse, Mervin Hack to discuss her concerns today. Currently th patient is living alone in the home. She is marginal with regard to safety and and ability to perform ADLs. Home health is trying to bridge the gap for her.   Depression screen [facility] 2/9 06/04/2018 05/29/2018 05/20/2018 05/12/2018 04/24/2018  Decreased Interest 3 0 0 0 0  Down, Depressed, Hopeless 0 0 0 0 0  PHQ - 2 Score 3 0 0 0 0  Altered sleeping 0 - - - -  Tired, decreased energy 3 - - - -  Change in appetite 0 - - - -  Feeling bad or failure about yourself  0 - - - -  Trouble concentrating 0 - - - -  Moving slowly or fidgety/restless 0 - - - -  Suicidal thoughts 0 - - - -  PHQ-9 Score 6 - - - -  Difficult doing work/chores - - - - -  Some recent data might be hidden     Relevant past medical, surgical, family and social history reviewed and updated as indicated.  Interim medical history since our last visit reviewed. Allergies and medications reviewed and updated.  ROS:  Review of Systems  Unable to perform ROS: Dementia     Social History   Tobacco Use  Smoking Status Never Smoker  Smokeless Tobacco Never Used       Objective:     Wt Readings from Last 3 Encounters:  02/17/19 163 lb 9.6 oz (74.2 kg)  02/12/19 166 lb 3.2 oz (75.4 kg)  02/09/19 166 lb 3.2 oz (75.4 kg)     Exam deferred. Pt. Harboring due to COVID 19. Phone visit performed.   Assessment & Plan:   1. Chronic systolic (congestive) heart failure (HCC)   2. Vascular dementia with behavior disturbance (HCC)   3. Pulmonary emphysema, unspecified emphysema type ([place])     No orders of the defined types were placed in this encounter.   No orders of the defined types were placed in  this encounter.     Diagnoses and all orders for this visit:  Chronic systolic (congestive) heart failure (HCC)  Vascular dementia with behavior disturbance ([place])  Pulmonary emphysema, unspecified emphysema type ([place])    Virtual Visit via telephone Note  I discussed the limitations, risks, security and privacy concerns of performing an evaluation and management service by telephone and the availability of in person appointments. The patient was identified with two identifiers. Pt.expressed understanding and agreed to proceed. Pt. Is at home. Dr. Livia Snellen is in his office.  Follow Up Instructions:   I discussed the assessment and treatment plan with the patient. The patient was provided an opportunity to ask questions and all were answered. The patient agreed with the plan and demonstrated an understanding of the instructions.   The patient was advised to call back or seek an in-person evaluation if the symptoms worsen or if the condition fails to improve as anticipated.   Total minutes including chart review and phone contact time: 20   Follow up plan: Return in about 2 weeks (around 05/20/2019).  Claretta Fraise, MD [place]

## 2019-05-08 ENCOUNTER — Ambulatory Visit: Payer: Medicare Other | Admitting: Family

## 2019-05-08 DIAGNOSIS — Z7952 Long term (current) use of systemic steroids: Secondary | ICD-10-CM | POA: Diagnosis not present

## 2019-05-08 DIAGNOSIS — Z7951 Long term (current) use of inhaled steroids: Secondary | ICD-10-CM | POA: Diagnosis not present

## 2019-05-08 DIAGNOSIS — R2689 Other abnormalities of gait and mobility: Secondary | ICD-10-CM | POA: Diagnosis not present

## 2019-05-08 DIAGNOSIS — I110 Hypertensive heart disease with heart failure: Secondary | ICD-10-CM | POA: Diagnosis not present

## 2019-05-08 DIAGNOSIS — M1991 Primary osteoarthritis, unspecified site: Secondary | ICD-10-CM | POA: Diagnosis not present

## 2019-05-08 DIAGNOSIS — M19011 Primary osteoarthritis, right shoulder: Secondary | ICD-10-CM | POA: Diagnosis not present

## 2019-05-08 DIAGNOSIS — I5022 Chronic systolic (congestive) heart failure: Secondary | ICD-10-CM | POA: Diagnosis not present

## 2019-05-08 DIAGNOSIS — Z7901 Long term (current) use of anticoagulants: Secondary | ICD-10-CM | POA: Diagnosis not present

## 2019-05-08 DIAGNOSIS — R296 Repeated falls: Secondary | ICD-10-CM | POA: Diagnosis not present

## 2019-05-11 ENCOUNTER — Other Ambulatory Visit: Payer: Self-pay | Admitting: Adult Health

## 2019-05-11 ENCOUNTER — Other Ambulatory Visit: Payer: Self-pay | Admitting: Family Medicine

## 2019-05-12 ENCOUNTER — Other Ambulatory Visit: Payer: Self-pay | Admitting: *Deleted

## 2019-05-12 DIAGNOSIS — I110 Hypertensive heart disease with heart failure: Secondary | ICD-10-CM | POA: Diagnosis not present

## 2019-05-12 DIAGNOSIS — Z7952 Long term (current) use of systemic steroids: Secondary | ICD-10-CM | POA: Diagnosis not present

## 2019-05-12 DIAGNOSIS — R2689 Other abnormalities of gait and mobility: Secondary | ICD-10-CM | POA: Diagnosis not present

## 2019-05-12 DIAGNOSIS — M1991 Primary osteoarthritis, unspecified site: Secondary | ICD-10-CM | POA: Diagnosis not present

## 2019-05-12 DIAGNOSIS — I5022 Chronic systolic (congestive) heart failure: Secondary | ICD-10-CM | POA: Diagnosis not present

## 2019-05-12 DIAGNOSIS — Z7951 Long term (current) use of inhaled steroids: Secondary | ICD-10-CM | POA: Diagnosis not present

## 2019-05-12 DIAGNOSIS — Z7901 Long term (current) use of anticoagulants: Secondary | ICD-10-CM | POA: Diagnosis not present

## 2019-05-12 DIAGNOSIS — R296 Repeated falls: Secondary | ICD-10-CM | POA: Diagnosis not present

## 2019-05-12 DIAGNOSIS — M19011 Primary osteoarthritis, right shoulder: Secondary | ICD-10-CM | POA: Diagnosis not present

## 2019-05-12 MED ORDER — GABAPENTIN 300 MG PO CAPS: 300.0000 mg | ORAL_CAPSULE | Freq: Two times a day (BID) | ORAL | 0 refills | Status: DC

## 2019-05-13 DIAGNOSIS — Z7951 Long term (current) use of inhaled steroids: Secondary | ICD-10-CM | POA: Diagnosis not present

## 2019-05-13 DIAGNOSIS — R296 Repeated falls: Secondary | ICD-10-CM | POA: Diagnosis not present

## 2019-05-13 DIAGNOSIS — M1991 Primary osteoarthritis, unspecified site: Secondary | ICD-10-CM | POA: Diagnosis not present

## 2019-05-13 DIAGNOSIS — I5022 Chronic systolic (congestive) heart failure: Secondary | ICD-10-CM | POA: Diagnosis not present

## 2019-05-13 DIAGNOSIS — Z7901 Long term (current) use of anticoagulants: Secondary | ICD-10-CM | POA: Diagnosis not present

## 2019-05-13 DIAGNOSIS — I110 Hypertensive heart disease with heart failure: Secondary | ICD-10-CM | POA: Diagnosis not present

## 2019-05-13 DIAGNOSIS — R2689 Other abnormalities of gait and mobility: Secondary | ICD-10-CM | POA: Diagnosis not present

## 2019-05-13 DIAGNOSIS — Z7952 Long term (current) use of systemic steroids: Secondary | ICD-10-CM | POA: Diagnosis not present

## 2019-05-13 DIAGNOSIS — M19011 Primary osteoarthritis, right shoulder: Secondary | ICD-10-CM | POA: Diagnosis not present

## 2019-05-14 DIAGNOSIS — Z7952 Long term (current) use of systemic steroids: Secondary | ICD-10-CM | POA: Diagnosis not present

## 2019-05-14 DIAGNOSIS — R2689 Other abnormalities of gait and mobility: Secondary | ICD-10-CM | POA: Diagnosis not present

## 2019-05-14 DIAGNOSIS — M19011 Primary osteoarthritis, right shoulder: Secondary | ICD-10-CM | POA: Diagnosis not present

## 2019-05-14 DIAGNOSIS — Z7901 Long term (current) use of anticoagulants: Secondary | ICD-10-CM | POA: Diagnosis not present

## 2019-05-14 DIAGNOSIS — I110 Hypertensive heart disease with heart failure: Secondary | ICD-10-CM | POA: Diagnosis not present

## 2019-05-14 DIAGNOSIS — R296 Repeated falls: Secondary | ICD-10-CM | POA: Diagnosis not present

## 2019-05-14 DIAGNOSIS — I5022 Chronic systolic (congestive) heart failure: Secondary | ICD-10-CM | POA: Diagnosis not present

## 2019-05-14 DIAGNOSIS — M1991 Primary osteoarthritis, unspecified site: Secondary | ICD-10-CM | POA: Diagnosis not present

## 2019-05-14 DIAGNOSIS — Z7951 Long term (current) use of inhaled steroids: Secondary | ICD-10-CM | POA: Diagnosis not present

## 2019-05-15 DIAGNOSIS — M19011 Primary osteoarthritis, right shoulder: Secondary | ICD-10-CM | POA: Diagnosis not present

## 2019-05-15 DIAGNOSIS — R296 Repeated falls: Secondary | ICD-10-CM | POA: Diagnosis not present

## 2019-05-15 DIAGNOSIS — R2689 Other abnormalities of gait and mobility: Secondary | ICD-10-CM | POA: Diagnosis not present

## 2019-05-15 DIAGNOSIS — I110 Hypertensive heart disease with heart failure: Secondary | ICD-10-CM | POA: Diagnosis not present

## 2019-05-15 DIAGNOSIS — Z7952 Long term (current) use of systemic steroids: Secondary | ICD-10-CM | POA: Diagnosis not present

## 2019-05-15 DIAGNOSIS — I5022 Chronic systolic (congestive) heart failure: Secondary | ICD-10-CM | POA: Diagnosis not present

## 2019-05-15 DIAGNOSIS — M1991 Primary osteoarthritis, unspecified site: Secondary | ICD-10-CM | POA: Diagnosis not present

## 2019-05-15 DIAGNOSIS — Z7951 Long term (current) use of inhaled steroids: Secondary | ICD-10-CM | POA: Diagnosis not present

## 2019-05-15 DIAGNOSIS — Z7901 Long term (current) use of anticoagulants: Secondary | ICD-10-CM | POA: Diagnosis not present

## 2019-05-18 ENCOUNTER — Other Ambulatory Visit: Payer: Self-pay | Admitting: Family Medicine

## 2019-05-18 ENCOUNTER — Other Ambulatory Visit: Payer: Self-pay | Admitting: Adult Health

## 2019-05-19 DIAGNOSIS — Z7952 Long term (current) use of systemic steroids: Secondary | ICD-10-CM | POA: Diagnosis not present

## 2019-05-19 DIAGNOSIS — M19011 Primary osteoarthritis, right shoulder: Secondary | ICD-10-CM | POA: Diagnosis not present

## 2019-05-19 DIAGNOSIS — I110 Hypertensive heart disease with heart failure: Secondary | ICD-10-CM | POA: Diagnosis not present

## 2019-05-19 DIAGNOSIS — I5022 Chronic systolic (congestive) heart failure: Secondary | ICD-10-CM | POA: Diagnosis not present

## 2019-05-19 DIAGNOSIS — Z7951 Long term (current) use of inhaled steroids: Secondary | ICD-10-CM | POA: Diagnosis not present

## 2019-05-19 DIAGNOSIS — Z7901 Long term (current) use of anticoagulants: Secondary | ICD-10-CM | POA: Diagnosis not present

## 2019-05-19 DIAGNOSIS — M1991 Primary osteoarthritis, unspecified site: Secondary | ICD-10-CM | POA: Diagnosis not present

## 2019-05-19 DIAGNOSIS — R2689 Other abnormalities of gait and mobility: Secondary | ICD-10-CM | POA: Diagnosis not present

## 2019-05-19 DIAGNOSIS — R296 Repeated falls: Secondary | ICD-10-CM | POA: Diagnosis not present

## 2019-05-21 ENCOUNTER — Other Ambulatory Visit: Payer: Self-pay | Admitting: *Deleted

## 2019-05-21 DIAGNOSIS — R2689 Other abnormalities of gait and mobility: Secondary | ICD-10-CM | POA: Diagnosis not present

## 2019-05-21 DIAGNOSIS — Z7901 Long term (current) use of anticoagulants: Secondary | ICD-10-CM | POA: Diagnosis not present

## 2019-05-21 DIAGNOSIS — R296 Repeated falls: Secondary | ICD-10-CM | POA: Diagnosis not present

## 2019-05-21 DIAGNOSIS — M19011 Primary osteoarthritis, right shoulder: Secondary | ICD-10-CM | POA: Diagnosis not present

## 2019-05-21 DIAGNOSIS — Z7951 Long term (current) use of inhaled steroids: Secondary | ICD-10-CM | POA: Diagnosis not present

## 2019-05-21 DIAGNOSIS — I110 Hypertensive heart disease with heart failure: Secondary | ICD-10-CM | POA: Diagnosis not present

## 2019-05-21 DIAGNOSIS — Z7952 Long term (current) use of systemic steroids: Secondary | ICD-10-CM | POA: Diagnosis not present

## 2019-05-21 DIAGNOSIS — M1991 Primary osteoarthritis, unspecified site: Secondary | ICD-10-CM | POA: Diagnosis not present

## 2019-05-21 DIAGNOSIS — I5022 Chronic systolic (congestive) heart failure: Secondary | ICD-10-CM | POA: Diagnosis not present

## 2019-05-21 MED ORDER — APIXABAN 5 MG PO TABS: 5.0000 mg | ORAL_TABLET | Freq: Two times a day (BID) | ORAL | 2 refills | Status: DC

## 2019-05-25 ENCOUNTER — Other Ambulatory Visit: Payer: Self-pay | Admitting: Family Medicine

## 2019-05-25 ENCOUNTER — Other Ambulatory Visit: Payer: Self-pay | Admitting: Adult Health

## 2019-05-26 ENCOUNTER — Other Ambulatory Visit: Payer: Self-pay | Admitting: *Deleted

## 2019-05-26 DIAGNOSIS — J9601 Acute respiratory failure with hypoxia: Secondary | ICD-10-CM | POA: Diagnosis not present

## 2019-05-26 DIAGNOSIS — L03119 Cellulitis of unspecified part of limb: Secondary | ICD-10-CM | POA: Diagnosis not present

## 2019-05-26 DIAGNOSIS — J449 Chronic obstructive pulmonary disease, unspecified: Secondary | ICD-10-CM | POA: Diagnosis not present

## 2019-05-26 MED ORDER — CARVEDILOL 3.125 MG PO TABS: 3.1250 mg | ORAL_TABLET | Freq: Two times a day (BID) | ORAL | 5 refills | Status: DC

## 2019-05-27 DIAGNOSIS — R2689 Other abnormalities of gait and mobility: Secondary | ICD-10-CM | POA: Diagnosis not present

## 2019-05-27 DIAGNOSIS — Z7951 Long term (current) use of inhaled steroids: Secondary | ICD-10-CM | POA: Diagnosis not present

## 2019-05-27 DIAGNOSIS — R296 Repeated falls: Secondary | ICD-10-CM | POA: Diagnosis not present

## 2019-05-27 DIAGNOSIS — M19011 Primary osteoarthritis, right shoulder: Secondary | ICD-10-CM | POA: Diagnosis not present

## 2019-05-27 DIAGNOSIS — Z7952 Long term (current) use of systemic steroids: Secondary | ICD-10-CM | POA: Diagnosis not present

## 2019-05-27 DIAGNOSIS — Z7901 Long term (current) use of anticoagulants: Secondary | ICD-10-CM | POA: Diagnosis not present

## 2019-05-27 DIAGNOSIS — I5022 Chronic systolic (congestive) heart failure: Secondary | ICD-10-CM | POA: Diagnosis not present

## 2019-05-27 DIAGNOSIS — M1991 Primary osteoarthritis, unspecified site: Secondary | ICD-10-CM | POA: Diagnosis not present

## 2019-05-27 DIAGNOSIS — I110 Hypertensive heart disease with heart failure: Secondary | ICD-10-CM | POA: Diagnosis not present

## 2019-05-28 DIAGNOSIS — I5022 Chronic systolic (congestive) heart failure: Secondary | ICD-10-CM | POA: Diagnosis not present

## 2019-05-28 DIAGNOSIS — R296 Repeated falls: Secondary | ICD-10-CM | POA: Diagnosis not present

## 2019-05-28 DIAGNOSIS — M1991 Primary osteoarthritis, unspecified site: Secondary | ICD-10-CM | POA: Diagnosis not present

## 2019-05-28 DIAGNOSIS — Z7901 Long term (current) use of anticoagulants: Secondary | ICD-10-CM | POA: Diagnosis not present

## 2019-05-28 DIAGNOSIS — Z7951 Long term (current) use of inhaled steroids: Secondary | ICD-10-CM | POA: Diagnosis not present

## 2019-05-28 DIAGNOSIS — Z7952 Long term (current) use of systemic steroids: Secondary | ICD-10-CM | POA: Diagnosis not present

## 2019-05-28 DIAGNOSIS — I110 Hypertensive heart disease with heart failure: Secondary | ICD-10-CM | POA: Diagnosis not present

## 2019-05-28 DIAGNOSIS — M19011 Primary osteoarthritis, right shoulder: Secondary | ICD-10-CM | POA: Diagnosis not present

## 2019-05-28 DIAGNOSIS — R2689 Other abnormalities of gait and mobility: Secondary | ICD-10-CM | POA: Diagnosis not present

## 2019-06-05 ENCOUNTER — Other Ambulatory Visit: Payer: Self-pay | Admitting: Family Medicine

## 2019-06-15 ENCOUNTER — Other Ambulatory Visit: Payer: Self-pay | Admitting: Family Medicine

## 2019-06-19 ENCOUNTER — Other Ambulatory Visit: Payer: Self-pay | Admitting: Family Medicine

## 2019-06-22 ENCOUNTER — Other Ambulatory Visit: Payer: Self-pay | Admitting: Family Medicine

## 2019-06-23 ENCOUNTER — Other Ambulatory Visit: Payer: Self-pay | Admitting: *Deleted

## 2019-06-23 NOTE — Progress Notes (Unsigned)
Estill Bamberg from Hospice called to let us know of a fall on Thursday. No injuries she is just a little sore.

## 2019-06-25 ENCOUNTER — Other Ambulatory Visit: Payer: Self-pay | Admitting: Adult Health

## 2019-07-01 ENCOUNTER — Other Ambulatory Visit: Payer: Self-pay | Admitting: *Deleted

## 2019-07-01 MED ORDER — VERAPAMIL HCL ER 180 MG PO TBCR: EXTENDED_RELEASE_TABLET | ORAL | 0 refills | Status: AC

## 2019-07-15 ENCOUNTER — Telehealth: Payer: Self-pay | Admitting: *Deleted

## 2019-07-15 ENCOUNTER — Other Ambulatory Visit: Payer: Self-pay | Admitting: *Deleted

## 2019-07-15 NOTE — Telephone Encounter (Signed)
Medication list reviewed with hospice nurse.

## 2019-07-23 ENCOUNTER — Other Ambulatory Visit: Payer: Self-pay | Admitting: Family Medicine

## 2019-07-23 MED ORDER — LORAZEPAM 0.5 MG PO TABS: 0.5000 mg | ORAL_TABLET | Freq: Every day | ORAL | 1 refills | Status: DC

## 2019-08-10 DIAGNOSIS — W19XXXA Unspecified fall, initial encounter: Secondary | ICD-10-CM | POA: Diagnosis not present

## 2019-08-10 DIAGNOSIS — R609 Edema, unspecified: Secondary | ICD-10-CM | POA: Diagnosis not present

## 2019-09-03 ENCOUNTER — Other Ambulatory Visit: Payer: Self-pay | Admitting: Family Medicine

## 2019-09-03 DIAGNOSIS — Z515 Encounter for palliative care: Secondary | ICD-10-CM

## 2019-09-03 MED ORDER — OXYCODONE-ACETAMINOPHEN 10-325 MG PO TABS: 1.0000 | ORAL_TABLET | Freq: Three times a day (TID) | ORAL | 0 refills | Status: DC | PRN

## 2019-09-08 ENCOUNTER — Telehealth: Payer: Self-pay | Admitting: *Deleted

## 2019-09-08 NOTE — Telephone Encounter (Signed)
FYI VM from [facility] w/ Hospice Pt had a fall last night around midnight

## 2019-09-16 ENCOUNTER — Telehealth: Payer: Self-pay | Admitting: *Deleted

## 2019-09-16 NOTE — Telephone Encounter (Signed)
Home health aware.

## 2019-09-16 NOTE — Telephone Encounter (Signed)
Just dress with neosporin and gauze prn. Do they need wound care instructions? Thanks, WS

## 2019-09-16 NOTE — Telephone Encounter (Signed)
FYI VM from [facility] w/ [facility] Friend took her out the other day, car door caught her leg, she has a 1.5 cm x  0.2 cm  Laceration no warmth, or redness

## 2019-09-21 ENCOUNTER — Telehealth: Payer: Self-pay | Admitting: *Deleted

## 2019-09-21 ENCOUNTER — Other Ambulatory Visit: Payer: Self-pay | Admitting: Family Medicine

## 2019-09-21 DIAGNOSIS — L03119 Cellulitis of unspecified part of limb: Secondary | ICD-10-CM

## 2019-09-21 MED ORDER — DOXYCYCLINE HYCLATE 100 MG PO TABS: 100.0000 mg | ORAL_TABLET | Freq: Two times a day (BID) | ORAL | 0 refills | Status: AC

## 2019-09-21 NOTE — Telephone Encounter (Signed)
TC from [place] w/ Hospice Cut to leg now red & warm, has been keeping covered w/ Vaseline Lorelle Form[place] and padding Can an abx be called into [place]

## 2019-09-21 NOTE — Telephone Encounter (Signed)
RX sent for doxycycline BID for 7 days. Follow up after completion for reevaluation.

## 2019-09-21 NOTE — Telephone Encounter (Signed)
Hospice nurse aware abx sent in and will let us know this does

## 2019-09-23 DIAGNOSIS — R69 Illness, unspecified: Secondary | ICD-10-CM | POA: Diagnosis not present

## 2019-09-23 DIAGNOSIS — R5381 Other malaise: Secondary | ICD-10-CM | POA: Diagnosis not present

## 2019-09-23 DIAGNOSIS — W19XXXA Unspecified fall, initial encounter: Secondary | ICD-10-CM | POA: Diagnosis not present

## 2019-10-16 ENCOUNTER — Telehealth: Payer: Self-pay | Admitting: *Deleted

## 2019-10-16 NOTE — Telephone Encounter (Signed)
Amanda aware.

## 2019-10-16 NOTE — Telephone Encounter (Signed)
Pt. Needs to be seen for this. Thanks, WS 

## 2019-10-16 NOTE — Telephone Encounter (Signed)
VM from [place] w/ Hospice Pt requesting an anti-depressant, she is struggling especially at night, scared at night. Hospice nurse, hospice social worker, as well as adult protective services have tried talking pt into placement somewhere since she is not safe at home, but she refuses. Please advise on Rx for depression

## 2019-10-21 ENCOUNTER — Other Ambulatory Visit: Payer: Self-pay | Admitting: Family Medicine

## 2019-10-26 DIAGNOSIS — R5381 Other malaise: Secondary | ICD-10-CM | POA: Diagnosis not present

## 2019-10-26 DIAGNOSIS — T1490XA Injury, unspecified, initial encounter: Secondary | ICD-10-CM | POA: Diagnosis not present

## 2019-10-27 ENCOUNTER — Telehealth: Payer: Self-pay | Admitting: *Deleted

## 2019-10-27 ENCOUNTER — Encounter: Payer: Self-pay | Admitting: *Deleted

## 2019-10-27 NOTE — Telephone Encounter (Signed)
VM from [facility] FYI pt fell again , in bathroom, no injuries, just sore.  Still trying to talk her into going into an assisted living Also her granddaugher [name] is no longer involved in care Paulina Fusi & son are

## 2019-11-27 ENCOUNTER — Telehealth: Payer: Self-pay | Admitting: *Deleted

## 2019-11-27 NOTE — Telephone Encounter (Signed)
FYI   VM from [place] w/ Hospice Pt fell on Sunday no injury, also received a call this morning, pt slid out of chair, laid on floor for 2 hrs until she was able to get assistance to get up. Hospice nurse has not seen her yet, pt reported that she was able to walk, just sore

## 2019-11-28 DIAGNOSIS — R69 Illness, unspecified: Secondary | ICD-10-CM | POA: Diagnosis not present

## 2019-11-29 DIAGNOSIS — R52 Pain, unspecified: Secondary | ICD-10-CM | POA: Diagnosis not present

## 2019-11-29 DIAGNOSIS — R0902 Hypoxemia: Secondary | ICD-10-CM | POA: Diagnosis not present

## 2019-11-29 DIAGNOSIS — M25552 Pain in left hip: Secondary | ICD-10-CM | POA: Diagnosis not present

## 2019-12-01 ENCOUNTER — Other Ambulatory Visit: Payer: Self-pay | Admitting: Family Medicine

## 2019-12-01 ENCOUNTER — Telehealth: Payer: Self-pay | Admitting: *Deleted

## 2019-12-01 NOTE — Telephone Encounter (Signed)
I just suggest keeping her comfortable. If she is suffering, then transport to hospital or hospice home seems appropriate. Thanks, WS

## 2019-12-01 NOTE — Telephone Encounter (Signed)
VM from [place] w/ Hospice Pt is getting weaker, has not been out of her chair since Wednesday or Thursday, refuses to get help to move around better or get hospital bed since she keeps sliding out of the recliner. Got another report called in on her this morning. The are in contact with SW at [facility]. They are going to meet today to talk with her. Pt can still answer questions but sees her declining in her judgement. Do we have any suggestions or concerns you can call Estill Bamberg

## 2019-12-01 NOTE — Telephone Encounter (Signed)
[name] Walker aware and verbalized understanding.

## 2019-12-03 ENCOUNTER — Telehealth: Payer: Self-pay | Admitting: *Deleted

## 2019-12-03 DIAGNOSIS — I10 Essential (primary) hypertension: Secondary | ICD-10-CM | POA: Diagnosis not present

## 2019-12-03 DIAGNOSIS — Z91013 Allergy to seafood: Secondary | ICD-10-CM | POA: Diagnosis not present

## 2019-12-03 DIAGNOSIS — Z91040 Latex allergy status: Secondary | ICD-10-CM | POA: Diagnosis not present

## 2019-12-03 DIAGNOSIS — Z880 Allergy status to penicillin: Secondary | ICD-10-CM | POA: Diagnosis not present

## 2019-12-03 DIAGNOSIS — R52 Pain, unspecified: Secondary | ICD-10-CM | POA: Diagnosis not present

## 2019-12-03 DIAGNOSIS — I4891 Unspecified atrial fibrillation: Secondary | ICD-10-CM | POA: Diagnosis not present

## 2019-12-03 DIAGNOSIS — K219 Gastro-esophageal reflux disease without esophagitis: Secondary | ICD-10-CM | POA: Diagnosis not present

## 2019-12-03 DIAGNOSIS — Z8249 Family history of ischemic heart disease and other diseases of the circulatory system: Secondary | ICD-10-CM | POA: Diagnosis not present

## 2019-12-03 DIAGNOSIS — Z7901 Long term (current) use of anticoagulants: Secondary | ICD-10-CM | POA: Diagnosis not present

## 2019-12-03 DIAGNOSIS — I959 Hypotension, unspecified: Secondary | ICD-10-CM | POA: Diagnosis not present

## 2019-12-03 DIAGNOSIS — M79672 Pain in left foot: Secondary | ICD-10-CM | POA: Diagnosis not present

## 2019-12-03 DIAGNOSIS — I517 Cardiomegaly: Secondary | ICD-10-CM | POA: Diagnosis not present

## 2019-12-03 DIAGNOSIS — W19XXXA Unspecified fall, initial encounter: Secondary | ICD-10-CM | POA: Diagnosis not present

## 2019-12-03 DIAGNOSIS — I739 Peripheral vascular disease, unspecified: Secondary | ICD-10-CM | POA: Diagnosis not present

## 2019-12-03 DIAGNOSIS — R0902 Hypoxemia: Secondary | ICD-10-CM | POA: Diagnosis not present

## 2019-12-03 DIAGNOSIS — M1990 Unspecified osteoarthritis, unspecified site: Secondary | ICD-10-CM | POA: Diagnosis not present

## 2019-12-03 DIAGNOSIS — Z743 Need for continuous supervision: Secondary | ICD-10-CM | POA: Diagnosis not present

## 2019-12-03 DIAGNOSIS — Z7401 Bed confinement status: Secondary | ICD-10-CM | POA: Diagnosis not present

## 2019-12-03 DIAGNOSIS — M79671 Pain in right foot: Secondary | ICD-10-CM | POA: Diagnosis not present

## 2019-12-03 DIAGNOSIS — G8929 Other chronic pain: Secondary | ICD-10-CM | POA: Diagnosis not present

## 2019-12-03 DIAGNOSIS — M25571 Pain in right ankle and joints of right foot: Secondary | ICD-10-CM | POA: Diagnosis not present

## 2019-12-03 DIAGNOSIS — R69 Illness, unspecified: Secondary | ICD-10-CM | POA: Diagnosis not present

## 2019-12-03 DIAGNOSIS — Z79899 Other long term (current) drug therapy: Secondary | ICD-10-CM | POA: Diagnosis not present

## 2019-12-03 DIAGNOSIS — N3000 Acute cystitis without hematuria: Secondary | ICD-10-CM | POA: Diagnosis not present

## 2019-12-03 NOTE — Telephone Encounter (Signed)
FYI TC from [facility] She received call on pt this morning, she has a knot on her foot, wanting to go to the hospital, this was not discouraged. Pt went to [facility] hoping this will help get patient placed some where as it is not safe for her to be at home alone and her caregivers are up in age as well

## 2019-12-04 ENCOUNTER — Telehealth: Payer: Self-pay | Admitting: *Deleted

## 2019-12-04 NOTE — Telephone Encounter (Signed)
VM from [place] w/ Hospice Pt went to [facility], she left AMA without being admitted for AFIB & UTI. Was not treated for the UTI Please advise on treating UTI, urine was done but cx was not back

## 2019-12-07 NOTE — Telephone Encounter (Signed)
NTBS seen- cannot see urine results

## 2019-12-08 ENCOUNTER — Telehealth: Payer: Self-pay | Admitting: Family Medicine

## 2019-12-08 NOTE — Telephone Encounter (Signed)
FYI for patient's provider.  Can close note after review.

## 2019-12-08 NOTE — Telephone Encounter (Signed)
She does not want to come to office for visit.  She says hospice staff will come to help her and will check back with hospital for medication.

## 2019-12-14 DIAGNOSIS — I959 Hypotension, unspecified: Secondary | ICD-10-CM | POA: Diagnosis not present

## 2019-12-14 DIAGNOSIS — Z7401 Bed confinement status: Secondary | ICD-10-CM | POA: Diagnosis not present

## 2019-12-30 ENCOUNTER — Emergency Department (HOSPITAL_COMMUNITY)
Admission: EM | Admit: 2019-12-30 | Discharge: 2019-12-31 | Disposition: A | Discharge status: Home / Self Care | Attending: Emergency Medicine | Admitting: Emergency Medicine

## 2019-12-30 ENCOUNTER — Other Ambulatory Visit: Payer: Self-pay

## 2019-12-30 ENCOUNTER — Encounter (HOSPITAL_COMMUNITY): Payer: Self-pay | Admitting: Emergency Medicine

## 2019-12-30 DIAGNOSIS — N390 Urinary tract infection, site not specified: Secondary | ICD-10-CM | POA: Insufficient documentation

## 2019-12-30 DIAGNOSIS — I110 Hypertensive heart disease with heart failure: Secondary | ICD-10-CM | POA: Insufficient documentation

## 2019-12-30 DIAGNOSIS — R0902 Hypoxemia: Secondary | ICD-10-CM | POA: Diagnosis not present

## 2019-12-30 DIAGNOSIS — Z91040 Latex allergy status: Secondary | ICD-10-CM | POA: Diagnosis not present

## 2019-12-30 DIAGNOSIS — I959 Hypotension, unspecified: Secondary | ICD-10-CM | POA: Diagnosis not present

## 2019-12-30 DIAGNOSIS — R300 Dysuria: Secondary | ICD-10-CM | POA: Diagnosis not present

## 2019-12-30 DIAGNOSIS — F0390 Unspecified dementia without behavioral disturbance: Secondary | ICD-10-CM | POA: Insufficient documentation

## 2019-12-30 DIAGNOSIS — J449 Chronic obstructive pulmonary disease, unspecified: Secondary | ICD-10-CM | POA: Diagnosis not present

## 2019-12-30 DIAGNOSIS — Z96652 Presence of left artificial knee joint: Secondary | ICD-10-CM | POA: Diagnosis not present

## 2019-12-30 DIAGNOSIS — Z743 Need for continuous supervision: Secondary | ICD-10-CM | POA: Diagnosis not present

## 2019-12-30 DIAGNOSIS — Z9981 Dependence on supplemental oxygen: Secondary | ICD-10-CM | POA: Insufficient documentation

## 2019-12-30 DIAGNOSIS — Z96651 Presence of right artificial knee joint: Secondary | ICD-10-CM | POA: Diagnosis not present

## 2019-12-30 DIAGNOSIS — Z960 Presence of urogenital implants: Secondary | ICD-10-CM | POA: Diagnosis not present

## 2019-12-30 DIAGNOSIS — Z7901 Long term (current) use of anticoagulants: Secondary | ICD-10-CM | POA: Insufficient documentation

## 2019-12-30 DIAGNOSIS — Z79899 Other long term (current) drug therapy: Secondary | ICD-10-CM | POA: Diagnosis not present

## 2019-12-30 DIAGNOSIS — I5022 Chronic systolic (congestive) heart failure: Secondary | ICD-10-CM | POA: Diagnosis not present

## 2019-12-30 DIAGNOSIS — R399 Unspecified symptoms and signs involving the genitourinary system: Secondary | ICD-10-CM | POA: Diagnosis present

## 2019-12-30 LAB — URINALYSIS, ROUTINE W REFLEX MICROSCOPIC
Bilirubin Urine: NEGATIVE
Glucose, UA: NEGATIVE mg/dL
Ketones, ur: NEGATIVE mg/dL
Nitrite: POSITIVE — AB
Protein, ur: 100 mg/dL — AB
RBC / HPF: >50 RBC/hpf — ABNORMAL HIGH (ref 0–5)
Specific Gravity, Urine: 1.011 (ref 1.005–1.030)
WBC, UA: >50 WBC/hpf — ABNORMAL HIGH (ref 0–5)
pH: 9 — ABNORMAL HIGH (ref 5.0–8.0)

## 2019-12-30 LAB — CBC
HCT: 34.9 % — ABNORMAL LOW (ref 36.0–46.0)
Hemoglobin: 11.1 g/dL — ABNORMAL LOW (ref 12.0–15.0)
MCH: 32.2 pg (ref 26.0–34.0)
MCHC: 31.8 g/dL (ref 30.0–36.0)
MCV: 101.2 fL — ABNORMAL HIGH (ref 80.0–100.0)
Platelets: 279 10*3/uL (ref 150–400)
RBC: 3.45 MIL/uL — ABNORMAL LOW (ref 3.87–5.11)
RDW: 13 % (ref 11.5–15.5)
WBC: 7.8 10*3/uL (ref 4.0–10.5)
nRBC: 0 % (ref 0.0–0.2)

## 2019-12-30 LAB — BASIC METABOLIC PANEL
Anion gap: 9 (ref 5–15)
BUN: 22 mg/dL (ref 8–23)
CO2: 30 mmol/L (ref 22–32)
Calcium: 8.5 mg/dL — ABNORMAL LOW (ref 8.9–10.3)
Chloride: 98 mmol/L (ref 98–111)
Creatinine, Ser: 0.88 mg/dL (ref 0.44–1.00)
GFR calc Af Amer: >60 mL/min (ref >60)
GFR calc non Af Amer: 57 mL/min — ABNORMAL LOW (ref >60)
Glucose, Bld: 107 mg/dL — ABNORMAL HIGH (ref 70–99)
Potassium: 3.9 mmol/L (ref 3.5–5.1)
Sodium: 137 mmol/L (ref 135–145)

## 2019-12-30 MED ORDER — CEFTRIAXONE SODIUM 1 G IJ SOLR
1.0000 g | Freq: Once | INTRAMUSCULAR | Status: AC
  Administered 2019-12-30: 1 g via INTRAMUSCULAR
  Filled 2019-12-30: qty 10

## 2019-12-30 MED ORDER — SODIUM CHLORIDE 0.9 % IV SOLN: 1.0000 g | Freq: Once | INTRAVENOUS | Status: DC

## 2019-12-30 MED ORDER — CEPHALEXIN 500 MG PO CAPS: 500.0000 mg | ORAL_CAPSULE | Freq: Four times a day (QID) | ORAL | 0 refills | Status: DC

## 2019-12-30 NOTE — Discharge Instructions (Addendum)
It was our pleasure to provide your ER care today - we hope that you feel better.  Your lab tests show a urine infection. We did change your catheter and sent a urine culture, the results of which will be back in 2 days time - have your doctor follow up on the culture results then.   Take antibiotic as prescribed.   Follow up closely with your doctor and/or hospice nurse.   Return to ER if worse, new symptoms, new or severe pain, persistent vomiting, or other concern.

## 2019-12-30 NOTE — ED Provider Notes (Signed)
[facility] EMERGENCY DEPARTMENT Provider Note   CSN: [id number] Arrival date & time: 12/30/19  2017     History Chief Complaint  Patient presents with  . Recurrent UTI    [name] Walker is a 86 y.o. female.  Patient w hx uti, presents with concern for urine infection. Patient has chronic indwelling foley, and now is leaking around catheter. Patient limited historian ?dementia - level 5 caveat. No abd pain or vomiting. t 99. Pt unaware of fevers. EMS reports in hospice care.   The history is provided by the patient and the EMS personnel. The history is limited by the condition of the patient.       Past Medical History:  Diagnosis Date  . Cataract   . COPD (chronic obstructive pulmonary disease) ([place])   . Depression   . DVT (deep venous thrombosis) ([place])   . Gastric erosion   . GERD (gastroesophageal reflux disease)   . Hypertension   . Insomnia   . Ischemic colitis ([place])   . Osteoarthritis   . Parotid mass 2016  . 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 Hospice care patient 09/03/2019  . Chronic systolic (congestive) heart failure ([place]) 02/09/2019  . Hypertensive heart disease with chronic systolic congestive heart failure ([place]) 02/09/2019  . Vascular dementia with behavior disturbance ([place]) 02/09/2019  . Neuropathy, peripheral, idiopathic 02/09/2019  . Pressure injury of skin 11/22/2018  . Bacteremia due to Gram-negative bacteria 11/20/2018  . AKI (acute kidney injury) ([place]) 11/20/2018  . Sepsis due to urinary tract infection ([place]) 11/19/2018  . Primary osteoarthritis of right shoulder 07/10/2018  . Primary osteoarthritis involving multiple joints 03/03/2018  . Imbalance 03/03/2018  . Frequent falls 03/03/2018  . Supplemental oxygen dependent 07/14/2016  . C5 vertebral fracture ([place]) 05/25/2016  . Hypokalemia 05/23/2016  . B12 deficiency 08/24/2015  . Insomnia 08/15/2015    . 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 Atrial fibrillation with RVR ([place]) 07/13/2015  . H/O parotidectomy 05/17/2015  . Osteopenia 05/13/2015  . Anemia 01/06/2009  . Depression, recurrent ([place]) 01/06/2009  . Essential hypertension 01/06/2009  . COPD (chronic obstructive pulmonary disease) ([place]) 01/06/2009  . GERD 01/06/2009  . IRRITABLE BOWEL SYNDROME 01/06/2009    Past Surgical History:  Procedure Laterality Date  . ABDOMINAL HYSTERECTOMY    . BACK SURGERY    . Cataracts    . CHOLECYSTECTOMY    . COLONOSCOPY    . ESOPHAGOGASTRODUODENOSCOPY  10/17/2007   erosion   . KNEE ARTHROPLASTY     bilateral  . PAROTIDECTOMY Left 05/17/2015   Procedure: PAROTIDECTOMY- LEFT TOTAL;  Surgeon: Leta Baptist, MD;  Location: [facility];  Service: ENT;  Laterality: Left;  . TOTAL HIP ARTHROPLASTY     bilateral     OB History   No obstetric history on file.     Family History  Problem Relation Age of Onset  . Rheum arthritis Mother   . Ulcers Mother        on foot  . Ulcers Father   . Cancer Sister        uterine    Social History   Tobacco Use  . Smoking status: Never Smoker  . Smokeless tobacco: Never Used  Substance Use Topics  . Alcohol use: No  . Drug use: No    Home Medications Prior to Admission medications   Medication Sig  Start Date End Date Taking? Authorizing Provider  amitriptyline (ELAVIL) 150 MG tablet Take 1 tablet (150 mg total) by mouth at bedtime. 05/01/19   Sharion Balloon, FNP  apixaban (ELIQUIS) 5 MG TABS tablet Take 1 tablet (5 mg total) by mouth 2 (two) times daily. Needs to be seen 05/21/19   Claretta Fraise, MD  Calcium Carbonate-Vitamin D (CALCIUM-VITAMIN D) 500-200 MG-UNIT tablet Take 1 tablet by mouth every morning.    [provider]  carvedilol (COREG) 3.125 MG tablet TAKE (1) TABLET BY MOUTH TWICE DAILY. 12/01/19   Claretta Fraise, MD  donepezil (ARICEPT) 10 MG tablet TAKE ONE TABLET AT BEDTIME  06/19/19   Claretta Fraise, MD  furosemide (LASIX) 40 MG tablet TAKE (2) TABLETS DAILY 06/15/19   Claretta Fraise, MD  gabapentin (NEURONTIN) 300 MG capsule TAKE  (1)  CAPSULE  TWICE DAILY. 06/22/19   Claretta Fraise, MD  levocetirizine (XYZAL) 5 MG tablet Take 1 tablet (5 mg total) by mouth at bedtime. 05/25/19   Claretta Fraise, MD  LORazepam (ATIVAN) 0.5 MG tablet Take 1 tablet (0.5 mg total) by mouth at bedtime. Prn sleep, nerves 07/23/19   Claretta Fraise, MD  magnesium hydroxide (MILK OF MAGNESIA) 400 MG/5ML suspension Take 30 mLs by mouth daily as needed for mild constipation. 02/09/19   [provider]  Menthol-Zinc Oxide (CALMOSEPTINE) 0.44-20.6 % OINT Apply to buttocks TID 05/01/19   Sharion Balloon, FNP  NON FORMULARY Diet Type:  D1, thin liquids 02/11/19   [provider]  omeprazole (PRILOSEC) 20 MG capsule TAKE 2 CAPSULES DAILY AS NEEDED FOR REFLUX 06/05/19   Claretta Fraise, MD  oxyCODONE-acetaminophen (PERCOCET) 10-325 MG tablet TAKE 1 TABLET BY MOUTH THREE TIMES DAILY AS NEEDED FOR PAIN. 10/23/19   Claretta Fraise, MD  OXYGEN Inhale 2 L/min into the lungs continuous.  02/06/19   [provider]  potassium chloride (K-DUR) 10 MEQ tablet TAKE 2 TABLETS DAILY AS DIRECTED 05/18/19   Claretta Fraise, MD  rOPINIRole (REQUIP) 1 MG tablet TAKE 1 OR 2 TABLETS AT BEDTIME 06/15/19   Claretta Fraise, MD  verapamil (CALAN-SR) 180 MG CR tablet Take 1 tablet (180 mg total) by mouth at bedtime. 07/01/19   Claretta Fraise, MD    Allergies    Diltiazem, Iohexol, Latex, Penicillins, and Shellfish allergy  Review of Systems   Review of Systems  Unable to perform ROS: Dementia  level 5 caveat - pt poor historian, ?dementia.     Physical Exam Updated Vital Signs Pulse 94   Temp 99.4 F (37.4 C) (Oral)   Resp 18   Ht 1.524 m (5')   Wt 72.6 kg   SpO2 96%   BMI 31.25 kg/m   Physical Exam Vitals and nursing note reviewed.  Constitutional:      Appearance: Normal appearance. She is  well-developed.  HENT:     Head: Atraumatic.     Nose: Nose normal.     Mouth/Throat:     Mouth: Mucous membranes are moist.  Eyes:     General: No scleral icterus.    Conjunctiva/sclera: Conjunctivae normal.  Neck:     Trachea: No tracheal deviation.  Cardiovascular:     Rate and Rhythm: Normal rate and regular rhythm.     Pulses: Normal pulses.     Heart sounds: Normal heart sounds. No murmur. No friction rub. No gallop.   Pulmonary:     Effort: Pulmonary effort is normal. No respiratory distress.     Breath sounds: Normal breath  sounds.  Abdominal:     General: Bowel sounds are normal. There is no distension.     Palpations: Abdomen is soft. There is no mass.     Tenderness: There is no abdominal tenderness. There is no guarding or rebound.     Hernia: No hernia is present.  Genitourinary:    Comments: No cva tenderness. Foley in place. Tube crusty/dirty appearing, with thick cloudy urine draining around tube.  Musculoskeletal:        General: No swelling or tenderness.     Cervical back: Normal range of motion and neck supple. No rigidity. No muscular tenderness.  Skin:    General: Skin is warm and dry.     Findings: No rash.  Neurological:     Mental Status: She is alert.     Comments: Alert, content appearing. Motor/sens grossly intact bil.  Psychiatric:        Mood and Affect: Mood normal.     ED Results / Procedures / Treatments   Labs (all labs ordered are listed, but only abnormal results are displayed) Results for orders placed or performed during the hospital encounter of 12/30/19  CBC  Result Value Ref Range   WBC 7.8 4.0 - 10.5 K/uL   RBC 3.45 (L) 3.87 - 5.11 MIL/uL   Hemoglobin 11.1 (L) 12.0 - 15.0 g/dL   HCT 34.9 (L) 36.0 - 46.0 %   MCV 101.2 (H) 80.0 - 100.0 fL   MCH 32.2 26.0 - 34.0 pg   MCHC 31.8 30.0 - 36.0 g/dL   RDW 13.0 11.5 - 15.5 %   Platelets 279 150 - 400 K/uL   nRBC 0.0 0.0 - 0.2 %  Basic metabolic panel  Result Value Ref Range    Sodium 137 135 - 145 mmol/L   Potassium 3.9 3.5 - 5.1 mmol/L   Chloride 98 98 - 111 mmol/L   CO2 30 22 - 32 mmol/L   Glucose, Bld 107 (H) 70 - 99 mg/dL   BUN 22 8 - 23 mg/dL   Creatinine, Ser 0.88 0.44 - 1.00 mg/dL   Calcium 8.5 (L) 8.9 - 10.3 mg/dL   GFR calc non Af Amer 57 (L) >60 mL/min   GFR calc Af Amer >60 >60 mL/min   Anion gap 9 5 - 15  Urinalysis, Routine w reflex microscopic  Result Value Ref Range   Color, Urine AMBER (A) YELLOW   APPearance CLOUDY (A) CLEAR   Specific Gravity, Urine 1.011 1.005 - 1.030   pH 9.0 (H) 5.0 - 8.0   Glucose, UA NEGATIVE NEGATIVE mg/dL   Hgb urine dipstick MODERATE (A) NEGATIVE   Bilirubin Urine NEGATIVE NEGATIVE   Ketones, ur NEGATIVE NEGATIVE mg/dL   Protein, ur 100 (A) NEGATIVE mg/dL   Nitrite POSITIVE (A) NEGATIVE   Leukocytes,Ua LARGE (A) NEGATIVE   RBC / HPF >50 (H) 0 - 5 RBC/hpf   WBC, UA >50 (H) 0 - 5 WBC/hpf   Bacteria, UA RARE (A) NONE SEEN   WBC Clumps PRESENT    EKG None  Radiology No results found.  Procedures Procedures (including critical care time)  Medications Ordered in ED Medications - No data to display  ED Course  I have reviewed the triage vital signs and the nursing notes.  Pertinent labs & imaging results that were available during my care of the patient were reviewed by me and considered in my medical decision making (see chart for details).    MDM Rules/Calculators/A&P  Iv ns. Stat labs. Urine.   Reviewed nursing notes and prior charts for additional history.   Old foley d/c'd. Area cleaned thoroughly, new foley inserted.   Urine sent.   Labs reviewed/interpreted by me - wbc normal. Chem and renal fxn normal. ua positive for uti. Culture sent. Rocephin 1 gm iv.   Pt with hospice care, dnr. Vitals currently normal. Wbc normal.   Rec pcp/hospice f/u. Given chronic debility/deconditioning/comorbidities - pt w very limited prognosis. Pt currently appears comfortable, in no  acute pain or discomfort, tolerating po - plan to d/c to home, rx provided.     Final Clinical Impression(s) / ED Diagnoses Final diagnoses:  None    Rx / DC Orders ED Discharge Orders    None       Lajean Saver, MD 12/30/19 2220

## 2019-12-30 NOTE — ED Triage Notes (Signed)
Patient brought in by EMS from home. EMS gave 500 of normal saline fluid. Patient is under hospice care and is a DNR for COPD and heart failure. Patient was seen and treated for a UTI at [facility] last week. Patient is in 3 liters of oxygen at home.EMS increased patient's oxygen to 5 liters due to low O2 sats on room air. Patient brought in for UTI symptoms tonight. Patient refused to be admitted last week for UTI. DSS is involved with patient and family per EMS/consult for nursing home placement. Patient states abdominal pain at this time and does have a foley cathter in place.

## 2019-12-30 NOTE — ED Notes (Signed)
Pt states she ready to go

## 2019-12-31 NOTE — ED Notes (Addendum)
EMS arrived

## 2019-12-31 NOTE — ED Notes (Signed)
Called Caregiver Letta [phone]- states that daughter will be there waiting  At the house for the pt and ambulance

## 2020-01-02 LAB — URINE CULTURE: Culture: >=100000 — AB

## 2020-01-03 ENCOUNTER — Telehealth: Payer: Self-pay | Admitting: Emergency Medicine

## 2020-01-03 NOTE — Telephone Encounter (Signed)
Post ED Visit - Positive Culture Follow-up  Culture report reviewed by antimicrobial stewardship pharmacist: [name] []  [name], Pharm.D. []  [name], Pharm.D., BCPS AQ-ID []  [name], Pharm.D., BCPS []  [name], Pharm.D., BCPS []  [name], Pharm.D., BCPS, AAHIVP []  [name], Pharm.D., BCPS, AAHIVP []  [name], PharmD, BCPS []  [name], PharmD, BCPS []  [name], PharmD, BCPS [x]  [name], PharmD []  [name], PharmD, BCPS []  [name], PharmD  [name] []  [name], PharmD []  [name], PharmD []  [name], PharmD []  [name], Rph []  [name], PharmD []  [name], PharmD []  [name], PharmD []  [name], PharmD []  [name], PharmD []  [name], PharmD []  [name], PharmD []  [name], PharmD []  [name], PharmD   Positive urine culture Treated with Cephalexin, organism sensitive to the same and no further patient follow-up is required at this time.  [name] [facility] 01/03/2020, 2:03 PM

## 2020-01-19 ENCOUNTER — Other Ambulatory Visit: Payer: Self-pay | Admitting: Family

## 2020-01-19 DIAGNOSIS — F339 Major depressive disorder, recurrent, unspecified: Secondary | ICD-10-CM

## 2020-01-19 DIAGNOSIS — F411 Generalized anxiety disorder: Secondary | ICD-10-CM

## 2020-02-09 ENCOUNTER — Other Ambulatory Visit: Payer: Self-pay

## 2020-02-09 MED ORDER — OXYCODONE-ACETAMINOPHEN 10-325 MG PO TABS: 1.0000 | ORAL_TABLET | Freq: Three times a day (TID) | ORAL | 0 refills | Status: DC | PRN

## 2020-02-09 NOTE — Telephone Encounter (Signed)
Hospice Patient Last refill 10/23/2019, #45, no refills

## 2020-02-12 ENCOUNTER — Emergency Department (HOSPITAL_COMMUNITY)
Admission: EM | Admit: 2020-02-12 | Discharge: 2020-02-13 | Disposition: A | Discharge status: Home / Self Care | Payer: Medicare Other | Attending: Emergency Medicine | Admitting: Emergency Medicine

## 2020-02-12 ENCOUNTER — Other Ambulatory Visit: Payer: Self-pay

## 2020-02-12 DIAGNOSIS — Z85858 Personal history of malignant neoplasm of other endocrine glands: Secondary | ICD-10-CM | POA: Diagnosis not present

## 2020-02-12 DIAGNOSIS — I110 Hypertensive heart disease with heart failure: Secondary | ICD-10-CM | POA: Insufficient documentation

## 2020-02-12 DIAGNOSIS — J449 Chronic obstructive pulmonary disease, unspecified: Secondary | ICD-10-CM | POA: Insufficient documentation

## 2020-02-12 DIAGNOSIS — M79672 Pain in left foot: Secondary | ICD-10-CM | POA: Diagnosis not present

## 2020-02-12 DIAGNOSIS — Z7901 Long term (current) use of anticoagulants: Secondary | ICD-10-CM | POA: Diagnosis not present

## 2020-02-12 DIAGNOSIS — Z96643 Presence of artificial hip joint, bilateral: Secondary | ICD-10-CM | POA: Diagnosis not present

## 2020-02-12 DIAGNOSIS — Z79899 Other long term (current) drug therapy: Secondary | ICD-10-CM | POA: Diagnosis not present

## 2020-02-12 DIAGNOSIS — Z96653 Presence of artificial knee joint, bilateral: Secondary | ICD-10-CM | POA: Insufficient documentation

## 2020-02-12 DIAGNOSIS — I5022 Chronic systolic (congestive) heart failure: Secondary | ICD-10-CM | POA: Diagnosis not present

## 2020-02-12 DIAGNOSIS — Z743 Need for continuous supervision: Secondary | ICD-10-CM | POA: Diagnosis not present

## 2020-02-12 DIAGNOSIS — F0151 Vascular dementia with behavioral disturbance: Secondary | ICD-10-CM | POA: Diagnosis not present

## 2020-02-12 DIAGNOSIS — Z91040 Latex allergy status: Secondary | ICD-10-CM | POA: Diagnosis not present

## 2020-02-12 DIAGNOSIS — R5381 Other malaise: Secondary | ICD-10-CM | POA: Diagnosis not present

## 2020-02-12 DIAGNOSIS — M79671 Pain in right foot: Secondary | ICD-10-CM | POA: Insufficient documentation

## 2020-02-12 DIAGNOSIS — R600 Localized edema: Secondary | ICD-10-CM | POA: Diagnosis not present

## 2020-02-12 DIAGNOSIS — R52 Pain, unspecified: Secondary | ICD-10-CM | POA: Diagnosis not present

## 2020-02-12 DIAGNOSIS — I959 Hypotension, unspecified: Secondary | ICD-10-CM | POA: Diagnosis not present

## 2020-02-12 DIAGNOSIS — R531 Weakness: Secondary | ICD-10-CM | POA: Diagnosis not present

## 2020-02-12 NOTE — ED Triage Notes (Signed)
Pt is from home, in by [facility] for left foot pain that started this am.   Per ems, pt took a pain pill just prior to calling ems.

## 2020-02-13 ENCOUNTER — Emergency Department (HOSPITAL_COMMUNITY): Payer: Medicare Other

## 2020-02-13 ENCOUNTER — Encounter (HOSPITAL_COMMUNITY): Payer: Self-pay

## 2020-02-13 DIAGNOSIS — R600 Localized edema: Secondary | ICD-10-CM | POA: Diagnosis not present

## 2020-02-13 DIAGNOSIS — Z743 Need for continuous supervision: Secondary | ICD-10-CM | POA: Diagnosis not present

## 2020-02-13 DIAGNOSIS — Z7401 Bed confinement status: Secondary | ICD-10-CM | POA: Diagnosis not present

## 2020-02-13 DIAGNOSIS — R52 Pain, unspecified: Secondary | ICD-10-CM | POA: Diagnosis not present

## 2020-02-13 NOTE — ED Notes (Signed)
Pt c/o left foot pain that developed today, pt did take one of her "pain tablets" a few hours ago with improvement of pain,

## 2020-02-13 NOTE — ED Provider Notes (Signed)
[facility] EMERGENCY DEPARTMENT Provider Note   CSN: [id number] Arrival date & time: 02/12/20  2353   History Chief Complaint  Patient presents with  . Foot Pain    [name] [facility] is a 86 y.o. female.  The history is provided by the patient.  Foot Pain  She has history of hypertension, COPD, atrial fibrillation anticoagulated on apixaban and comes in complaining of pain in her left foot.  Patient is an extremely poor historian and I cannot get from her how long pain has been present.  She denies any trauma.  She apparently took some type of pain medication at home before arriving in the ED.  Past Medical History:  Diagnosis Date  . Cataract   . COPD (chronic obstructive pulmonary disease) ([place])   . Depression   . DVT (deep venous thrombosis) ([place])   . Gastric erosion   . GERD (gastroesophageal reflux disease)   . Hypertension   . Insomnia   . Ischemic colitis ([place])   . Osteoarthritis   . Parotid mass 2016  . 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 Hospice care patient 09/03/2019  . Chronic systolic (congestive) heart failure ([place]) 02/09/2019  . Hypertensive heart disease with chronic systolic congestive heart failure ([place]) 02/09/2019  . Vascular dementia with behavior disturbance ([place]) 02/09/2019  . Neuropathy, peripheral, idiopathic 02/09/2019  . Pressure injury of skin 11/22/2018  . Bacteremia due to Gram-negative bacteria 11/20/2018  . AKI (acute kidney injury) ([place]) 11/20/2018  . Sepsis due to urinary tract infection ([place]) 11/19/2018  . Primary osteoarthritis of right shoulder 07/10/2018  . Primary osteoarthritis involving multiple joints 03/03/2018  . Imbalance 03/03/2018  . Frequent falls 03/03/2018  . Supplemental oxygen dependent 07/14/2016  . C5 vertebral fracture ([place]) 05/25/2016  . Hypokalemia 05/23/2016  . B12 deficiency 08/24/2015  . Insomnia 08/15/2015  .  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 Atrial fibrillation with RVR ([place]) 07/13/2015  . H/O parotidectomy 05/17/2015  . Osteopenia 05/13/2015  . Anemia 01/06/2009  . Depression, recurrent ([place]) 01/06/2009  . Essential hypertension 01/06/2009  . COPD (chronic obstructive pulmonary disease) ([place]) 01/06/2009  . GERD 01/06/2009  . IRRITABLE BOWEL SYNDROME 01/06/2009    Past Surgical History:  Procedure Laterality Date  . ABDOMINAL HYSTERECTOMY    . BACK SURGERY    . Cataracts    . CHOLECYSTECTOMY    . COLONOSCOPY    . ESOPHAGOGASTRODUODENOSCOPY  10/17/2007   erosion   . KNEE ARTHROPLASTY     bilateral  . PAROTIDECTOMY Left 05/17/2015   Procedure: PAROTIDECTOMY- LEFT TOTAL;  Surgeon: Leta Baptist, MD;  Location: [facility];  Service: ENT;  Laterality: Left;  . TOTAL HIP ARTHROPLASTY     bilateral     OB History   No obstetric history on file.     Family History  Problem Relation Age of Onset  . Rheum arthritis Mother   . Ulcers Mother        on foot  . Ulcers Father   . Cancer Sister        uterine    Social History   Tobacco Use  . Smoking status: Never Smoker  . Smokeless tobacco: Never Used  Substance Use Topics  . Alcohol use: No  . Drug use: No    Home Medications Prior to Admission medications   Medication Sig Start Date End Date  Taking? Authorizing Provider  amitriptyline (ELAVIL) 150 MG tablet TAKE (1) TABLET BY MOUTH AT BEDTIME. 01/19/20   Claretta Fraise, MD  apixaban (ELIQUIS) 5 MG TABS tablet Take 1 tablet (5 mg total) by mouth 2 (two) times daily. Needs to be seen 05/21/19   Claretta Fraise, MD  Calcium Carbonate-Vitamin D (CALCIUM-VITAMIN D) 500-200 MG-UNIT tablet Take 1 tablet by mouth every morning.    [provider]  carvedilol (COREG) 3.125 MG tablet TAKE (1) TABLET BY MOUTH TWICE DAILY. 12/01/19   Claretta Fraise, MD  cephALEXin (KEFLEX) 500 MG capsule Take 1 capsule (500 mg total) by mouth 4 (four)  times daily. 12/30/19   Lajean Saver, MD  donepezil (ARICEPT) 10 MG tablet TAKE ONE TABLET AT BEDTIME 06/19/19   Claretta Fraise, MD  furosemide (LASIX) 40 MG tablet TAKE (2) TABLETS DAILY 06/15/19   Claretta Fraise, MD  gabapentin (NEURONTIN) 300 MG capsule TAKE  (1)  CAPSULE  TWICE DAILY. 06/22/19   Claretta Fraise, MD  levocetirizine (XYZAL) 5 MG tablet Take 1 tablet (5 mg total) by mouth at bedtime. 05/25/19   Claretta Fraise, MD  LORazepam (ATIVAN) 0.5 MG tablet Take 1 tablet (0.5 mg total) by mouth at bedtime. Prn sleep, nerves 07/23/19   Claretta Fraise, MD  magnesium hydroxide (MILK OF MAGNESIA) 400 MG/5ML suspension Take 30 mLs by mouth daily as needed for mild constipation. 02/09/19   [provider]  Menthol-Zinc Oxide (CALMOSEPTINE) 0.44-20.6 % OINT Apply to buttocks TID 05/01/19   Sharion Balloon, FNP  NON FORMULARY Diet Type:  D1, thin liquids 02/11/19   [provider]  omeprazole (PRILOSEC) 20 MG capsule TAKE 2 CAPSULES DAILY AS NEEDED FOR REFLUX 06/05/19   Claretta Fraise, MD  oxyCODONE-acetaminophen (PERCOCET) 10-325 MG tablet Take 1 tablet by mouth 3 (three) times daily as needed. for pain 02/09/20   Claretta Fraise, MD  OXYGEN Inhale 2 L/min into the lungs continuous.  02/06/19   [provider]  potassium chloride (K-DUR) 10 MEQ tablet TAKE 2 TABLETS DAILY AS DIRECTED 05/18/19   Claretta Fraise, MD  rOPINIRole (REQUIP) 1 MG tablet TAKE 1 OR 2 TABLETS AT BEDTIME 06/15/19   Claretta Fraise, MD  verapamil (CALAN-SR) 180 MG CR tablet Take 1 tablet (180 mg total) by mouth at bedtime. 07/01/19   Claretta Fraise, MD    Allergies    Diltiazem, Iohexol, Latex, Penicillins, and Shellfish allergy  Review of Systems   Review of Systems  All other systems reviewed and are negative.   Physical Exam Updated Vital Signs BP 107/74 (BP Location: Left Arm)   Pulse 85   Temp 98.1 F (36.7 C) (Oral)   Resp 18   SpO2 96%   Physical Exam Vitals and nursing note reviewed.   86  year old female, resting comfortably and in no acute distress. Vital signs are normal. Oxygen saturation is 96%, which is normal. Head is normocephalic and atraumatic. PERRLA, EOMI. Oropharynx is clear. Neck is nontender and supple without adenopathy or JVD. Back is nontender and there is no CVA tenderness. Lungs are clear without rales, wheezes, or rhonchi. Chest is nontender. Heart has regular rate and rhythm without murmur. Abdomen is soft, flat, nontender without masses or hepatosplenomegaly and peristalsis is normoactive. Extremities have no cyanosis or edema, full range of motion is present.  No swelling or tenderness identified in the left foot. Skin is warm and dry without rash. Neurologic: Somewhat somnolent but arousable, cranial nerves are intact, there are no motor or sensory  deficits.  ED Results / Procedures / Treatments    Radiology DG Foot Complete Left  Result Date: 02/13/2020 CLINICAL DATA:  Left foot pain began today, improved with medication EXAM: LEFT FOOT - COMPLETE 3+ VIEW COMPARISON:  Left foot radiograph 02/05/2019 FINDINGS: The osseous structures appear diffusely demineralized which may limit detection of small or nondisplaced fractures. No acute bony abnormality. Specifically, no fracture, subluxation, or dislocation. Mild degenerative changes throughout the foot. Corticated os peroneum is noted. Vascular calcium noted in the soft tissues. Diffuse mild edematous soft tissue thickening. IMPRESSION: No acute osseous abnormality. Diffuse osseous demineralization may limit detection of small or nondisplaced fractures. Diffuse mild lower extremity edema. Electronically Signed   By: Lovena Le M.D.   On: 02/13/2020 01:52    Procedures Procedures   Medications Ordered in ED Medications - No data to display  ED Course  I have reviewed the triage vital signs and the nursing notes.  Pertinent imaging results that were available during my care of the patient were  reviewed by me and considered in my medical decision making (see chart for details).  MDM Rules/Calculators/A&P Left foot pain without any physical findings to suggest cause.  Will check x-ray.  Old records are reviewed, and she has no relevant past visits.  X-ray shows no evidence of fracture.  Patient continues to be sleeping comfortably.  She is discharged with instructions to continue using over-the-counter analgesics as needed for pain.  Final Clinical Impression(s) / ED Diagnoses Final diagnoses:  Foot pain, left    Rx / DC Orders ED Discharge Orders    None       Delora Fuel, MD [phone]

## 2020-02-13 NOTE — Discharge Instructions (Addendum)
Take ibuprofen or acetaminophen as needed.

## 2020-02-13 NOTE — ED Notes (Signed)
Ems here to transport pt

## 2020-02-13 NOTE — ED Notes (Signed)
RN spoke with Letta Kocher at [phone] who advised that she would have her daughter meet ems at the residence, RN also spoke with Juliann Pulse who was Hospice RN on call for tonight and they were not aware of pt

## 2020-02-18 ENCOUNTER — Other Ambulatory Visit: Payer: Self-pay | Admitting: Family Medicine

## 2020-02-18 DIAGNOSIS — F339 Major depressive disorder, recurrent, unspecified: Secondary | ICD-10-CM

## 2020-02-18 DIAGNOSIS — F411 Generalized anxiety disorder: Secondary | ICD-10-CM

## 2020-02-20 ENCOUNTER — Telehealth: Payer: Self-pay | Admitting: Infectious Diseases

## 2020-02-20 NOTE — Telephone Encounter (Signed)
I called the patient on home number and the provided contact Sharyn Lull - left a voicemail requesting a call back to discuss opportunity to receive Moderna vaccine in the home if she has not yet been vaccinated against [place].   Will try back.    Janene Madeira, MSN, NP-C [facility] [email or web address] Pager: [phone] Office: [phone] [place]: [phone]

## 2020-02-22 ENCOUNTER — Telehealth: Payer: Self-pay | Admitting: Adult Health

## 2020-02-22 NOTE — Telephone Encounter (Signed)
Called and talked to patient about COVID19 vaccine.  Patient let me know that she was instructed by her health care provider that she cannot take the injection because of her heart condition and heart medications.  I offered to reach out to her health care provider to discuss with them, however she told me that she didn't want me to.  Wilber Bihari, NP

## 2020-03-01 ENCOUNTER — Ambulatory Visit: Payer: Medicare Other

## 2020-03-02 DIAGNOSIS — M19031 Primary osteoarthritis, right wrist: Secondary | ICD-10-CM | POA: Diagnosis not present

## 2020-03-02 DIAGNOSIS — R29898 Other symptoms and signs involving the musculoskeletal system: Secondary | ICD-10-CM | POA: Diagnosis not present

## 2020-03-02 DIAGNOSIS — M79601 Pain in right arm: Secondary | ICD-10-CM | POA: Diagnosis not present

## 2020-03-02 DIAGNOSIS — M25512 Pain in left shoulder: Secondary | ICD-10-CM | POA: Diagnosis not present

## 2020-03-02 DIAGNOSIS — M159 Polyosteoarthritis, unspecified: Secondary | ICD-10-CM | POA: Diagnosis not present

## 2020-03-02 DIAGNOSIS — Z7401 Bed confinement status: Secondary | ICD-10-CM | POA: Diagnosis not present

## 2020-03-02 DIAGNOSIS — I959 Hypotension, unspecified: Secondary | ICD-10-CM | POA: Diagnosis not present

## 2020-03-02 DIAGNOSIS — M19012 Primary osteoarthritis, left shoulder: Secondary | ICD-10-CM | POA: Diagnosis not present

## 2020-03-02 DIAGNOSIS — M79602 Pain in left arm: Secondary | ICD-10-CM | POA: Diagnosis not present

## 2020-03-02 DIAGNOSIS — M19032 Primary osteoarthritis, left wrist: Secondary | ICD-10-CM | POA: Diagnosis not present

## 2020-03-02 DIAGNOSIS — M79632 Pain in left forearm: Secondary | ICD-10-CM | POA: Diagnosis not present

## 2020-03-02 DIAGNOSIS — R6889 Other general symptoms and signs: Secondary | ICD-10-CM | POA: Diagnosis not present

## 2020-03-02 DIAGNOSIS — M79603 Pain in arm, unspecified: Secondary | ICD-10-CM | POA: Diagnosis not present

## 2020-03-02 DIAGNOSIS — I10 Essential (primary) hypertension: Secondary | ICD-10-CM | POA: Diagnosis not present

## 2020-03-02 DIAGNOSIS — W19XXXA Unspecified fall, initial encounter: Secondary | ICD-10-CM | POA: Diagnosis not present

## 2020-03-02 DIAGNOSIS — Z743 Need for continuous supervision: Secondary | ICD-10-CM | POA: Diagnosis not present

## 2020-03-04 ENCOUNTER — Telehealth: Payer: Self-pay | Admitting: *Deleted

## 2020-03-04 NOTE — Telephone Encounter (Addendum)
LMOVM to D/C both medications

## 2020-03-04 NOTE — Telephone Encounter (Signed)
Leann w/ Hospice Visit today, was filling med box Pt did not have Vit D 2 Qam & Requip 1-2 QHS prn Please advise if pt is still on these medications

## 2020-03-04 NOTE — Telephone Encounter (Signed)
Dc both

## 2020-03-08 ENCOUNTER — Other Ambulatory Visit: Payer: Self-pay | Admitting: *Deleted

## 2020-03-15 DIAGNOSIS — I509 Heart failure, unspecified: Secondary | ICD-10-CM | POA: Diagnosis not present

## 2020-03-15 DIAGNOSIS — I4891 Unspecified atrial fibrillation: Secondary | ICD-10-CM | POA: Diagnosis not present

## 2020-03-15 DIAGNOSIS — M1990 Unspecified osteoarthritis, unspecified site: Secondary | ICD-10-CM | POA: Diagnosis not present

## 2020-03-15 DIAGNOSIS — K219 Gastro-esophageal reflux disease without esophagitis: Secondary | ICD-10-CM | POA: Diagnosis not present

## 2020-03-15 DIAGNOSIS — J449 Chronic obstructive pulmonary disease, unspecified: Secondary | ICD-10-CM | POA: Diagnosis not present

## 2020-03-23 DIAGNOSIS — H5789 Other specified disorders of eye and adnexa: Secondary | ICD-10-CM | POA: Diagnosis not present

## 2020-03-23 DIAGNOSIS — I4891 Unspecified atrial fibrillation: Secondary | ICD-10-CM | POA: Diagnosis not present

## 2020-03-23 DIAGNOSIS — I509 Heart failure, unspecified: Secondary | ICD-10-CM | POA: Diagnosis not present

## 2020-03-23 DIAGNOSIS — J449 Chronic obstructive pulmonary disease, unspecified: Secondary | ICD-10-CM | POA: Diagnosis not present

## 2020-03-29 ENCOUNTER — Ambulatory Visit: Payer: Medicare Other

## 2020-04-04 DIAGNOSIS — I509 Heart failure, unspecified: Secondary | ICD-10-CM | POA: Diagnosis not present

## 2020-04-21 ENCOUNTER — Emergency Department (HOSPITAL_COMMUNITY)

## 2020-04-21 ENCOUNTER — Encounter (HOSPITAL_COMMUNITY): Payer: Self-pay

## 2020-04-21 ENCOUNTER — Inpatient Hospital Stay (HOSPITAL_COMMUNITY)
Admission: EM | Admit: 2020-04-21 | Discharge: 2020-04-23 | DRG: 439 | Disposition: A | Discharge status: Skilled Nursing Facility | Source: Skilled Nursing Facility | Attending: Internal Medicine | Admitting: Internal Medicine

## 2020-04-21 ENCOUNTER — Other Ambulatory Visit: Payer: Self-pay

## 2020-04-21 DIAGNOSIS — I110 Hypertensive heart disease with heart failure: Secondary | ICD-10-CM | POA: Diagnosis present

## 2020-04-21 DIAGNOSIS — I4891 Unspecified atrial fibrillation: Secondary | ICD-10-CM | POA: Diagnosis not present

## 2020-04-21 DIAGNOSIS — Z79899 Other long term (current) drug therapy: Secondary | ICD-10-CM | POA: Diagnosis not present

## 2020-04-21 DIAGNOSIS — B9620 Unspecified Escherichia coli [E. coli] as the cause of diseases classified elsewhere: Secondary | ICD-10-CM | POA: Diagnosis not present

## 2020-04-21 DIAGNOSIS — R112 Nausea with vomiting, unspecified: Secondary | ICD-10-CM

## 2020-04-21 DIAGNOSIS — N200 Calculus of kidney: Secondary | ICD-10-CM | POA: Diagnosis not present

## 2020-04-21 DIAGNOSIS — R109 Unspecified abdominal pain: Secondary | ICD-10-CM

## 2020-04-21 DIAGNOSIS — Z9049 Acquired absence of other specified parts of digestive tract: Secondary | ICD-10-CM

## 2020-04-21 DIAGNOSIS — R1084 Generalized abdominal pain: Secondary | ICD-10-CM | POA: Diagnosis not present

## 2020-04-21 DIAGNOSIS — Z86718 Personal history of other venous thrombosis and embolism: Secondary | ICD-10-CM

## 2020-04-21 DIAGNOSIS — D539 Nutritional anemia, unspecified: Secondary | ICD-10-CM | POA: Diagnosis not present

## 2020-04-21 DIAGNOSIS — J449 Chronic obstructive pulmonary disease, unspecified: Secondary | ICD-10-CM | POA: Diagnosis present

## 2020-04-21 DIAGNOSIS — Z7901 Long term (current) use of anticoagulants: Secondary | ICD-10-CM

## 2020-04-21 DIAGNOSIS — E876 Hypokalemia: Secondary | ICD-10-CM | POA: Diagnosis present

## 2020-04-21 DIAGNOSIS — E538 Deficiency of other specified B group vitamins: Secondary | ICD-10-CM | POA: Diagnosis present

## 2020-04-21 DIAGNOSIS — Z96653 Presence of artificial knee joint, bilateral: Secondary | ICD-10-CM | POA: Diagnosis not present

## 2020-04-21 DIAGNOSIS — Z9981 Dependence on supplemental oxygen: Secondary | ICD-10-CM | POA: Diagnosis not present

## 2020-04-21 DIAGNOSIS — J8489 Other specified interstitial pulmonary diseases: Secondary | ICD-10-CM | POA: Diagnosis not present

## 2020-04-21 DIAGNOSIS — R0902 Hypoxemia: Secondary | ICD-10-CM | POA: Diagnosis not present

## 2020-04-21 DIAGNOSIS — N390 Urinary tract infection, site not specified: Secondary | ICD-10-CM | POA: Diagnosis present

## 2020-04-21 DIAGNOSIS — I5022 Chronic systolic (congestive) heart failure: Secondary | ICD-10-CM | POA: Diagnosis not present

## 2020-04-21 DIAGNOSIS — I10 Essential (primary) hypertension: Secondary | ICD-10-CM | POA: Diagnosis not present

## 2020-04-21 DIAGNOSIS — R197 Diarrhea, unspecified: Secondary | ICD-10-CM

## 2020-04-21 DIAGNOSIS — Z66 Do not resuscitate: Secondary | ICD-10-CM | POA: Diagnosis present

## 2020-04-21 DIAGNOSIS — D649 Anemia, unspecified: Secondary | ICD-10-CM | POA: Diagnosis present

## 2020-04-21 DIAGNOSIS — I700 Atherosclerosis of aorta: Secondary | ICD-10-CM | POA: Diagnosis not present

## 2020-04-21 DIAGNOSIS — R52 Pain, unspecified: Secondary | ICD-10-CM | POA: Diagnosis not present

## 2020-04-21 DIAGNOSIS — K219 Gastro-esophageal reflux disease without esophagitis: Secondary | ICD-10-CM | POA: Diagnosis present

## 2020-04-21 DIAGNOSIS — K859 Acute pancreatitis without necrosis or infection, unspecified: Principal | ICD-10-CM | POA: Diagnosis present

## 2020-04-21 DIAGNOSIS — Z515 Encounter for palliative care: Secondary | ICD-10-CM | POA: Diagnosis present

## 2020-04-21 DIAGNOSIS — I2510 Atherosclerotic heart disease of native coronary artery without angina pectoris: Secondary | ICD-10-CM | POA: Diagnosis not present

## 2020-04-21 DIAGNOSIS — Z85818 Personal history of malignant neoplasm of other sites of lip, oral cavity, and pharynx: Secondary | ICD-10-CM

## 2020-04-21 DIAGNOSIS — B952 Enterococcus as the cause of diseases classified elsewhere: Secondary | ICD-10-CM | POA: Diagnosis present

## 2020-04-21 DIAGNOSIS — Z96643 Presence of artificial hip joint, bilateral: Secondary | ICD-10-CM | POA: Diagnosis present

## 2020-04-21 DIAGNOSIS — Z90710 Acquired absence of both cervix and uterus: Secondary | ICD-10-CM

## 2020-04-21 DIAGNOSIS — K3189 Other diseases of stomach and duodenum: Secondary | ICD-10-CM | POA: Diagnosis not present

## 2020-04-21 DIAGNOSIS — Z20822 Contact with and (suspected) exposure to covid-19: Secondary | ICD-10-CM | POA: Diagnosis not present

## 2020-04-21 LAB — COMPREHENSIVE METABOLIC PANEL
ALT: 21 U/L (ref 0–44)
AST: 34 U/L (ref 15–41)
Albumin: 3.1 g/dL — ABNORMAL LOW (ref 3.5–5.0)
Alkaline Phosphatase: 180 U/L — ABNORMAL HIGH (ref 38–126)
Anion gap: 11 (ref 5–15)
BUN: 16 mg/dL (ref 8–23)
CO2: 27 mmol/L (ref 22–32)
Calcium: 8.4 mg/dL — ABNORMAL LOW (ref 8.9–10.3)
Chloride: 101 mmol/L (ref 98–111)
Creatinine, Ser: 0.82 mg/dL (ref 0.44–1.00)
GFR calc Af Amer: >60 mL/min (ref >60)
GFR calc non Af Amer: >60 mL/min (ref >60)
Glucose, Bld: 109 mg/dL — ABNORMAL HIGH (ref 70–99)
Potassium: 3.5 mmol/L (ref 3.5–5.1)
Sodium: 139 mmol/L (ref 135–145)
Total Bilirubin: 0.6 mg/dL (ref 0.3–1.2)
Total Protein: 6.5 g/dL (ref 6.5–8.1)

## 2020-04-21 LAB — CBC WITH DIFFERENTIAL/PLATELET
Abs Immature Granulocytes: 0.02 10*3/uL (ref 0.00–0.07)
Basophils Absolute: 0 10*3/uL (ref 0.0–0.1)
Basophils Relative: 0 %
Eosinophils Absolute: 0.2 10*3/uL (ref 0.0–0.5)
Eosinophils Relative: 2 %
HCT: 34.1 % — ABNORMAL LOW (ref 36.0–46.0)
Hemoglobin: 11 g/dL — ABNORMAL LOW (ref 12.0–15.0)
Immature Granulocytes: 0 %
Lymphocytes Relative: 10 %
Lymphs Abs: 0.8 10*3/uL (ref 0.7–4.0)
MCH: 32.5 pg (ref 26.0–34.0)
MCHC: 32.3 g/dL (ref 30.0–36.0)
MCV: 100.9 fL — ABNORMAL HIGH (ref 80.0–100.0)
Monocytes Absolute: 0.9 10*3/uL (ref 0.1–1.0)
Monocytes Relative: 11 %
Neutro Abs: 6.5 10*3/uL (ref 1.7–7.7)
Neutrophils Relative %: 77 %
Platelets: 259 10*3/uL (ref 150–400)
RBC: 3.38 MIL/uL — ABNORMAL LOW (ref 3.87–5.11)
RDW: 13.2 % (ref 11.5–15.5)
WBC: 8.5 10*3/uL (ref 4.0–10.5)
nRBC: 0 % (ref 0.0–0.2)

## 2020-04-21 LAB — URINALYSIS, ROUTINE W REFLEX MICROSCOPIC
Bilirubin Urine: NEGATIVE
Glucose, UA: NEGATIVE mg/dL
Ketones, ur: NEGATIVE mg/dL
Nitrite: NEGATIVE
Protein, ur: 100 mg/dL — AB
RBC / HPF: >50 RBC/hpf — ABNORMAL HIGH (ref 0–5)
Specific Gravity, Urine: 1.011 (ref 1.005–1.030)
WBC, UA: >50 WBC/hpf — ABNORMAL HIGH (ref 0–5)
pH: 6 (ref 5.0–8.0)

## 2020-04-21 LAB — LIPASE, BLOOD: Lipase: 3168 U/L — ABNORMAL HIGH (ref 11–51)

## 2020-04-21 LAB — LACTIC ACID, PLASMA: Lactic Acid, Venous: 2 mmol/L (ref 0.5–1.9)

## 2020-04-21 MED ORDER — ONDANSETRON HCL 4 MG/2ML IJ SOLN
4.0000 mg | Freq: Once | INTRAMUSCULAR | Status: AC
  Administered 2020-04-21: 4 mg via INTRAVENOUS
  Filled 2020-04-21: qty 2

## 2020-04-21 MED ORDER — SODIUM CHLORIDE 0.9 % IV SOLN
1.0000 g | Freq: Once | INTRAVENOUS | Status: AC
  Administered 2020-04-22: 1 g via INTRAVENOUS
  Filled 2020-04-21: qty 10

## 2020-04-21 MED ORDER — DIPHENHYDRAMINE HCL 25 MG PO CAPS: 50.0000 mg | ORAL_CAPSULE | Freq: Once | ORAL | Status: AC

## 2020-04-21 MED ORDER — FENTANYL CITRATE (PF) 100 MCG/2ML IJ SOLN
50.0000 ug | Freq: Once | INTRAMUSCULAR | Status: AC
  Administered 2020-04-21: 50 ug via INTRAVENOUS
  Filled 2020-04-21: qty 2

## 2020-04-21 MED ORDER — HYDROCORTISONE NA SUCCINATE PF 250 MG IJ SOLR
200.0000 mg | Freq: Once | INTRAMUSCULAR | Status: AC
  Administered 2020-04-21: 200 mg via INTRAVENOUS
  Filled 2020-04-21: qty 200

## 2020-04-21 MED ORDER — DIPHENHYDRAMINE HCL 50 MG/ML IJ SOLN
50.0000 mg | Freq: Once | INTRAMUSCULAR | Status: AC
  Administered 2020-04-22: 50 mg via INTRAVENOUS
  Filled 2020-04-21: qty 1

## 2020-04-21 NOTE — ED Triage Notes (Addendum)
Pt is resident of [facility], today with onset of abd pain and projectile vomiting.  Pt received 4 mg zofran iv per ems.

## 2020-04-21 NOTE — ED Provider Notes (Signed)
Care assumed from Dr. Kathrynn Humble, patient with abdominal pain and markedly elevated lipase pending CT abdomen and pelvis.  Also, urinary tract infection, getting started on antibiotics.  CT scan shows evidence of pancreatitis.  Case is discussed with Dr. Josephine Cables of [facility] hospitalist, who agrees to admit the patient.  Results for orders placed or performed during the hospital encounter of 04/21/20  SARS Coronavirus 2 by RT PCR (hospital order, performed in [facility] hospital lab) Nasopharyngeal Nasopharyngeal Swab   Specimen: Nasopharyngeal Swab  Result Value Ref Range   SARS Coronavirus 2 NEGATIVE NEGATIVE  Comprehensive metabolic panel  Result Value Ref Range   Sodium 139 135 - 145 mmol/L   Potassium 3.5 3.5 - 5.1 mmol/L   Chloride 101 98 - 111 mmol/L   CO2 27 22 - 32 mmol/L   Glucose, Bld 109 (H) 70 - 99 mg/dL   BUN 16 8 - 23 mg/dL   Creatinine, Ser 0.82 0.44 - 1.00 mg/dL   Calcium 8.4 (L) 8.9 - 10.3 mg/dL   Total Protein 6.5 6.5 - 8.1 g/dL   Albumin 3.1 (L) 3.5 - 5.0 g/dL   AST 34 15 - 41 U/L   ALT 21 0 - 44 U/L   Alkaline Phosphatase 180 (H) 38 - 126 U/L   Total Bilirubin 0.6 0.3 - 1.2 mg/dL   GFR calc non Af Amer >60 >60 mL/min   GFR calc Af Amer >60 >60 mL/min   Anion gap 11 5 - 15  Lipase, blood  Result Value Ref Range   Lipase 3,168 (H) 11 - 51 U/L  CBC with Diff  Result Value Ref Range   WBC 8.5 4.0 - 10.5 K/uL   RBC 3.38 (L) 3.87 - 5.11 MIL/uL   Hemoglobin 11.0 (L) 12.0 - 15.0 g/dL   HCT 34.1 (L) 36 - 46 %   MCV 100.9 (H) 80.0 - 100.0 fL   MCH 32.5 26.0 - 34.0 pg   MCHC 32.3 30.0 - 36.0 g/dL   RDW 13.2 11.5 - 15.5 %   Platelets 259 150 - 400 K/uL   nRBC 0.0 0.0 - 0.2 %   Neutrophils Relative % 77 %   Neutro Abs 6.5 1.7 - 7.7 K/uL   Lymphocytes Relative 10 %   Lymphs Abs 0.8 0.7 - 4.0 K/uL   Monocytes Relative 11 %   Monocytes Absolute 0.9 0 - 1 K/uL   Eosinophils Relative 2 %   Eosinophils Absolute 0.2 0 - 0 K/uL   Basophils Relative 0 %   Basophils  Absolute 0.0 0 - 0 K/uL   Immature Granulocytes 0 %   Abs Immature Granulocytes 0.02 0.00 - 0.07 K/uL  Urinalysis, Routine w reflex microscopic  Result Value Ref Range   Color, Urine YELLOW YELLOW   APPearance TURBID (A) CLEAR   Specific Gravity, Urine 1.011 1.005 - 1.030   pH 6.0 5.0 - 8.0   Glucose, UA NEGATIVE NEGATIVE mg/dL   Hgb urine dipstick LARGE (A) NEGATIVE   Bilirubin Urine NEGATIVE NEGATIVE   Ketones, ur NEGATIVE NEGATIVE mg/dL   Protein, ur 100 (A) NEGATIVE mg/dL   Nitrite NEGATIVE NEGATIVE   Leukocytes,Ua MODERATE (A) NEGATIVE   RBC / HPF >50 (H) 0 - 5 RBC/hpf   WBC, UA >50 (H) 0 - 5 WBC/hpf   Bacteria, UA MANY (A) NONE SEEN   Squamous Epithelial / LPF 6-10 0 - 5   WBC Clumps PRESENT    Mucus PRESENT    Ca Oxalate Crys, UA  PRESENT    Non Squamous Epithelial 0-5 (A) NONE SEEN  Lactic acid, plasma  Result Value Ref Range   Lactic Acid, Venous 2.0 (HH) 0.5 - 1.9 mmol/L  Lactic acid, plasma  Result Value Ref Range   Lactic Acid, Venous 1.8 0.5 - 1.9 mmol/L  Lactate dehydrogenase  Result Value Ref Range   LDH 173 98 - 192 U/L   CT ABDOMEN PELVIS W CONTRAST  Result Date: 04/22/2020 CLINICAL DATA:  Epigastric pain for 1 day EXAM: CT ABDOMEN AND PELVIS WITH CONTRAST TECHNIQUE: Multidetector CT imaging of the abdomen and pelvis was performed using the standard protocol following bolus administration of intravenous contrast. CONTRAST:  156mL OMNIPAQUE IOHEXOL 300 MG/ML  SOLN COMPARISON:  11/28/2007, plain film from previous day. FINDINGS: Lower chest: No acute abnormality. Hepatobiliary: No focal liver abnormality is seen. Status post cholecystectomy. Dilatation of the common bile duct is noted consistent with the post cholecystectomy state. Pancreas: Pancreas demonstrates some peripancreatic inflammatory change in the region of the pancreatic head consistent with focal pancreatitis. This is new from the prior study. Spleen: Normal in size without focal abnormality.  Adrenals/Urinary Tract: Renal calculi are noted bilaterally without obstructive change. Right-sided renal cyst is seen. No obstructive changes are noted. The bladder is partially distended. Air is noted bladder recent instrumentation. Stomach/Bowel: Colon shows no obstructive or inflammatory changes. The appendix is not well visualized although no inflammatory changes to suggest appendicitis are seen. Stomach and small bowel appear within normal limits. Vascular/Lymphatic: Aortic atherosclerosis. No enlarged abdominal or pelvic lymph nodes. Reproductive: Status post hysterectomy. No adnexal masses. Other: No abdominal wall hernia or abnormality. No abdominopelvic ascites. Musculoskeletal: Bilateral hip replacements are noted. Degenerative changes of lumbar spine are seen. IMPRESSION: Changes of mild pancreatitis in the pancreatic head. Bilateral renal calculi without obstructive change. Electronically Signed   By: Inez Catalina M.D.   On: 04/22/2020 02:24   DG ABD ACUTE 2+V W 1V CHEST  Result Date: 04/21/2020 CLINICAL DATA:  Abdominal pain and nausea. EXAM: DG ABDOMEN ACUTE W/ 1V CHEST COMPARISON:  None. FINDINGS: Lung volumes are low. Mild cardiomegaly. Aortic atherosclerosis. Interstitial coarsening which is likely chronic. No focal airspace disease or pleural fluid. Air-filled bowel in the upper abdomen likely represent significant gaseous distention of stomach. No other bowel dilatation. Air in stool throughout the colon in a nonobstructive pattern. There is no evidence of free air. IVC filter in place. There are vascular calcifications. Surgical clips in the right upper quadrant typical of cholecystectomy. Advanced degenerative change in the shoulders. Bilateral hip arthroplasties. Scoliotic curvature in the spine. IMPRESSION: 1. Gaseous distention of stomach. No other bowel dilatation to suggest obstruction. 2. Low lung volumes with mild cardiomegaly and chronic interstitial coarsening. Electronically  Signed   By: Keith Rake M.D.   On: 62/56/[id number] 73:42      Delora Fuel, MD 87/68/11 0300

## 2020-04-21 NOTE — ED Provider Notes (Signed)
[facility] EMERGENCY DEPARTMENT Provider Note   CSN: [id number] Arrival date & time: 04/21/20  2041     History Chief Complaint  Patient presents with  . Abdominal Pain    [name] Walker is a 86 y.o. female.  HPI    86 year old female comes in a chief complaint of abdominal pain.  Patient resides at a nursing facility.  She has history of ischemic colitis, hypertension, COPD, DVT and GERD.  Patient reports that she started having abdominal pain earlier today.  The pain is generalized and severe.  She had multiple episodes of emesis prior to ED arrival.  She also reports that she had 3 or 4 large bowel movements within the last 24 hours.  She does not think there was any blood in her emesis or her stools.  Patient has no recollection of similar pain in the past.  She denies any major abdominal surgeries.  She does not recall being given the diagnosis of ischemic colitis.  Past Medical History:  Diagnosis Date  . Cataract   . COPD (chronic obstructive pulmonary disease) ([place])   . Depression   . DVT (deep venous thrombosis) ([place])   . Gastric erosion   . GERD (gastroesophageal reflux disease)   . Hypertension   . Insomnia   . Ischemic colitis ([employer])   . Osteoarthritis   . Parotid mass 2016  . 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 Hospice care patient 09/03/2019  . Chronic systolic (congestive) heart failure ([place]) 02/09/2019  . Hypertensive heart disease with chronic systolic congestive heart failure ([place]) 02/09/2019  . Vascular dementia with behavior disturbance ([place]) 02/09/2019  . Neuropathy, peripheral, idiopathic 02/09/2019  . Pressure injury of skin 11/22/2018  . Bacteremia due to Gram-negative bacteria 11/20/2018  . AKI (acute kidney injury) ([place]) 11/20/2018  . Sepsis due to urinary tract infection ([place]) 11/19/2018  . Primary osteoarthritis of right shoulder 07/10/2018  .  Primary osteoarthritis involving multiple joints 03/03/2018  . Imbalance 03/03/2018  . Frequent falls 03/03/2018  . Supplemental oxygen dependent 07/14/2016  . C5 vertebral fracture ([place]) 05/25/2016  . Hypokalemia 05/23/2016  . B12 deficiency 08/24/2015  . Insomnia 08/15/2015  . 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 Atrial fibrillation with RVR ([place]) 07/13/2015  . H/O parotidectomy 05/17/2015  . Osteopenia 05/13/2015  . Anemia 01/06/2009  . Depression, recurrent ([place]) 01/06/2009  . Essential hypertension 01/06/2009  . COPD (chronic obstructive pulmonary disease) ([place]) 01/06/2009  . GERD 01/06/2009  . IRRITABLE BOWEL SYNDROME 01/06/2009    Past Surgical History:  Procedure Laterality Date  . ABDOMINAL HYSTERECTOMY    . BACK SURGERY    . Cataracts    . CHOLECYSTECTOMY    . COLONOSCOPY    . ESOPHAGOGASTRODUODENOSCOPY  10/17/2007   erosion   . KNEE ARTHROPLASTY     bilateral  . PAROTIDECTOMY Left 05/17/2015   Procedure: PAROTIDECTOMY- LEFT TOTAL;  Surgeon: Leta Baptist, MD;  Location: [facility];  Service: ENT;  Laterality: Left;  . TOTAL HIP ARTHROPLASTY     bilateral     OB History   No obstetric history on file.     Family History  Problem Relation Age of Onset  . Rheum arthritis Mother   . Ulcers Mother        on foot  . Ulcers Father   . Cancer Sister  uterine    Social History   Tobacco Use  . Smoking status: Never Smoker  . Smokeless tobacco: Never Used  Vaping Use  . Vaping Use: Never used  Substance Use Topics  . Alcohol use: No  . Drug use: No    Home Medications Prior to Admission medications   Medication Sig Start Date End Date Taking? Authorizing Provider  amitriptyline (ELAVIL) 150 MG tablet TAKE (1) TABLET BY MOUTH AT BEDTIME. Patient taking differently: Take 150 mg by mouth at bedtime.  02/19/20  Yes Stacks, Cletus Gash, MD  apixaban (ELIQUIS) 2.5 MG TABS tablet Take 2.5 mg by mouth 2 (two) times  daily.   Yes [provider]  carvedilol (COREG) 3.125 MG tablet TAKE (1) TABLET BY MOUTH TWICE DAILY. Patient taking differently: Take 3.125 mg by mouth 2 (two) times daily with a meal.  12/01/19  Yes Stacks, Cletus Gash, MD  cetirizine (ZYRTEC) 10 MG tablet Take 10 mg by mouth daily.   Yes [provider]  donepezil (ARICEPT) 10 MG tablet TAKE ONE TABLET AT BEDTIME Patient taking differently: Take 10 mg by mouth at bedtime.  06/19/19  Yes Stacks, Cletus Gash, MD  Emollient (EUCERIN) lotion Apply topically every 12 (twelve) hours as needed for dry skin.   Yes [provider]  furosemide (LASIX) 40 MG tablet TAKE (2) TABLETS DAILY Patient taking differently: Take 40 mg by mouth 2 (two) times daily.  06/15/19  Yes Stacks, Cletus Gash, MD  gabapentin (NEURONTIN) 300 MG capsule TAKE  (1)  CAPSULE  TWICE DAILY. Patient taking differently: Take 300 mg by mouth 2 (two) times daily.  06/22/19  Yes Stacks, Cletus Gash, MD  LORazepam (ATIVAN) 0.5 MG tablet Take 1 tablet (0.5 mg total) by mouth at bedtime. Prn sleep, nerves Patient taking differently: Take 0.5 mg by mouth at bedtime.  07/23/19  Yes Stacks, Cletus Gash, MD  Menthol-Zinc Oxide (CALMOSEPTINE) 0.44-20.6 % OINT Apply to buttocks TID Patient taking differently: Apply 1 application topically 3 (three) times daily as needed (to buttocks area). Apply to buttocks TID 05/01/19  Yes Hawks, Christy A, FNP  omeprazole (PRILOSEC) 20 MG capsule TAKE 2 CAPSULES DAILY AS NEEDED FOR REFLUX Patient taking differently: Take 20 mg by mouth 2 (two) times daily before a meal.  06/05/19  Yes Stacks, Cletus Gash, MD  potassium chloride (KLOR-CON) 10 MEQ tablet TAKE 2 TABLETS BY MOUTH DAILY. Patient taking differently: Take 20 mEq by mouth daily.  02/19/20  Yes Stacks, Cletus Gash, MD  senna (SENOKOT) 8.6 MG tablet Take 1 tablet by mouth in the morning and at bedtime.   Yes [provider]  verapamil (CALAN-SR) 180 MG CR tablet Take 1 tablet (180 mg total) by mouth at  bedtime. Patient taking differently: Take 180 mg by mouth daily.  07/01/19  Yes StacksCletus Gash, MD  OXYGEN Inhale 2 L/min into the lungs continuous.  02/06/19   [provider]    Allergies    Diltiazem, Iohexol, Latex, Penicillins, and Shellfish allergy  Review of Systems   Review of Systems  Constitutional: Positive for activity change.  Respiratory: Negative for shortness of breath.   Cardiovascular: Negative for chest pain.  Gastrointestinal: Positive for abdominal pain, diarrhea, nausea and vomiting.  All other systems reviewed and are negative.   Physical Exam Updated Vital Signs BP (!) 147/59   Pulse 68   Resp 14   Ht 5' (1.524 m)   Wt 72 kg   SpO2 98%   BMI 31.00 kg/m   Physical Exam Vitals and nursing  note reviewed.  Constitutional:      Appearance: She is well-developed.  HENT:     Head: Normocephalic and atraumatic.  Cardiovascular:     Rate and Rhythm: Normal rate.  Pulmonary:     Effort: Pulmonary effort is normal.  Abdominal:     General: Bowel sounds are normal.     Tenderness: There is generalized abdominal tenderness and tenderness in the right upper quadrant, epigastric area and periumbilical area. There is guarding. There is no rebound.  Musculoskeletal:     Cervical back: Normal range of motion and neck supple.  Skin:    General: Skin is warm and dry.  Neurological:     Mental Status: She is alert and oriented to person, place, and time.     ED Results / Procedures / Treatments   Labs (all labs ordered are listed, but only abnormal results are displayed) Labs Reviewed  COMPREHENSIVE METABOLIC PANEL - Abnormal; Notable for the following components:      Result Value   Glucose, Bld 109 (*)    Calcium 8.4 (*)    Albumin 3.1 (*)    Alkaline Phosphatase 180 (*)    All other components within normal limits  LIPASE, BLOOD - Abnormal; Notable for the following components:   Lipase 3,168 (*)    All other components within normal limits    CBC WITH DIFFERENTIAL/PLATELET - Abnormal; Notable for the following components:   RBC 3.38 (*)    Hemoglobin 11.0 (*)    HCT 34.1 (*)    MCV 100.9 (*)    All other components within normal limits  URINALYSIS, ROUTINE W REFLEX MICROSCOPIC - Abnormal; Notable for the following components:   APPearance TURBID (*)    Hgb urine dipstick LARGE (*)    Protein, ur 100 (*)    Leukocytes,Ua MODERATE (*)    RBC / HPF >50 (*)    WBC, UA >50 (*)    Bacteria, UA MANY (*)    Non Squamous Epithelial 0-5 (*)    All other components within normal limits  LACTIC ACID, PLASMA - Abnormal; Notable for the following components:   Lactic Acid, Venous 2.0 (*)    All other components within normal limits  URINE CULTURE  LACTIC ACID, PLASMA    EKG None  Radiology DG ABD ACUTE 2+V W 1V CHEST  Result Date: 04/21/2020 CLINICAL DATA:  Abdominal pain and nausea. EXAM: DG ABDOMEN ACUTE W/ 1V CHEST COMPARISON:  None. FINDINGS: Lung volumes are low. Mild cardiomegaly. Aortic atherosclerosis. Interstitial coarsening which is likely chronic. No focal airspace disease or pleural fluid. Air-filled bowel in the upper abdomen likely represent significant gaseous distention of stomach. No other bowel dilatation. Air in stool throughout the colon in a nonobstructive pattern. There is no evidence of free air. IVC filter in place. There are vascular calcifications. Surgical clips in the right upper quadrant typical of cholecystectomy. Advanced degenerative change in the shoulders. Bilateral hip arthroplasties. Scoliotic curvature in the spine. IMPRESSION: 1. Gaseous distention of stomach. No other bowel dilatation to suggest obstruction. 2. Low lung volumes with mild cardiomegaly and chronic interstitial coarsening. Electronically Signed   By: Keith Rake M.D.   On: 04/21/2020 22:13    Procedures Procedures (including critical care time)  Medications Ordered in ED Medications  diphenhydrAMINE (BENADRYL) capsule 50  mg (has no administration in time range)    Or  diphenhydrAMINE (BENADRYL) injection 50 mg (has no administration in time range)  cefTRIAXone (ROCEPHIN) 1 g in sodium  chloride 0.9 % 100 mL IVPB (has no administration in time range)  fentaNYL (SUBLIMAZE) injection 50 mcg (50 mcg Intravenous Given 04/21/20 2127)  ondansetron (ZOFRAN) injection 4 mg (4 mg Intravenous Given 04/21/20 2127)  hydrocortisone sodium succinate (SOLU-CORTEF) injection 200 mg (200 mg Intravenous Given 04/21/20 2204)    ED Course  I have reviewed the triage vital signs and the nursing notes.  Pertinent labs & imaging results that were available during my care of the patient were reviewed by me and considered in my medical decision making (see chart for details).    MDM Rules/Calculators/A&P                          86 year old female comes in with chief complaint of severe abdominal pain.  She is also complaining of nausea, vomiting and perhaps some loose bowel movements.  On exam she is noted to have peritoneal findings in the upper abdominal exam.   Clinical concerns are for small bowel obstruction, ischemic colitis, pancreatitis, perforated viscus.  Initial lab work-up reveals significantly elevated lipase and alk phos.  CT scan of the abdomen has been ordered, unfortunately it will be delayed because of known history of allergy to contrast.  Patient will likely need admission.  Additionally, UA is also showing pyuria, bacteriuria, will start antibiotics.  Dr. Roxanne Mins will take over the care.  Final Clinical Impression(s) / ED Diagnoses Final diagnoses:  Acute pancreatitis, unspecified complication status, unspecified pancreatitis type  Lower urinary tract infectious disease    Rx / DC Orders ED Discharge Orders    None       Varney Biles, MD 04/22/20 0003

## 2020-04-21 NOTE — ED Notes (Signed)
Called [facility] for solucortef ( not in the pyxis)

## 2020-04-21 NOTE — ED Notes (Signed)
Date and time results received: 04/21/20 2224 (use smartphrase ".now" to insert current time)  Test: Lactic Critical Value: 2.0  Name of Provider Notified: Dr Kathrynn Humble  Orders Received? Or Actions Taken?:

## 2020-04-22 ENCOUNTER — Emergency Department (HOSPITAL_COMMUNITY)

## 2020-04-22 ENCOUNTER — Inpatient Hospital Stay (HOSPITAL_COMMUNITY)

## 2020-04-22 ENCOUNTER — Encounter (HOSPITAL_COMMUNITY): Payer: Self-pay | Admitting: Radiology

## 2020-04-22 DIAGNOSIS — I4891 Unspecified atrial fibrillation: Secondary | ICD-10-CM | POA: Diagnosis present

## 2020-04-22 DIAGNOSIS — D649 Anemia, unspecified: Secondary | ICD-10-CM

## 2020-04-22 DIAGNOSIS — I110 Hypertensive heart disease with heart failure: Secondary | ICD-10-CM | POA: Diagnosis present

## 2020-04-22 DIAGNOSIS — R109 Unspecified abdominal pain: Secondary | ICD-10-CM

## 2020-04-22 DIAGNOSIS — Z86718 Personal history of other venous thrombosis and embolism: Secondary | ICD-10-CM | POA: Diagnosis not present

## 2020-04-22 DIAGNOSIS — N390 Urinary tract infection, site not specified: Secondary | ICD-10-CM | POA: Diagnosis present

## 2020-04-22 DIAGNOSIS — J449 Chronic obstructive pulmonary disease, unspecified: Secondary | ICD-10-CM | POA: Diagnosis not present

## 2020-04-22 DIAGNOSIS — B9620 Unspecified Escherichia coli [E. coli] as the cause of diseases classified elsewhere: Secondary | ICD-10-CM | POA: Diagnosis present

## 2020-04-22 DIAGNOSIS — Z96653 Presence of artificial knee joint, bilateral: Secondary | ICD-10-CM | POA: Diagnosis present

## 2020-04-22 DIAGNOSIS — E876 Hypokalemia: Secondary | ICD-10-CM | POA: Diagnosis present

## 2020-04-22 DIAGNOSIS — I5022 Chronic systolic (congestive) heart failure: Secondary | ICD-10-CM | POA: Diagnosis present

## 2020-04-22 DIAGNOSIS — R197 Diarrhea, unspecified: Secondary | ICD-10-CM

## 2020-04-22 DIAGNOSIS — Z79899 Other long term (current) drug therapy: Secondary | ICD-10-CM | POA: Diagnosis not present

## 2020-04-22 DIAGNOSIS — D539 Nutritional anemia, unspecified: Secondary | ICD-10-CM | POA: Diagnosis present

## 2020-04-22 DIAGNOSIS — Z66 Do not resuscitate: Secondary | ICD-10-CM | POA: Diagnosis present

## 2020-04-22 DIAGNOSIS — Z515 Encounter for palliative care: Secondary | ICD-10-CM | POA: Diagnosis present

## 2020-04-22 DIAGNOSIS — K649 Unspecified hemorrhoids: Secondary | ICD-10-CM | POA: Diagnosis not present

## 2020-04-22 DIAGNOSIS — Z9049 Acquired absence of other specified parts of digestive tract: Secondary | ICD-10-CM | POA: Diagnosis not present

## 2020-04-22 DIAGNOSIS — R112 Nausea with vomiting, unspecified: Secondary | ICD-10-CM

## 2020-04-22 DIAGNOSIS — Z7901 Long term (current) use of anticoagulants: Secondary | ICD-10-CM | POA: Diagnosis not present

## 2020-04-22 DIAGNOSIS — R7989 Other specified abnormal findings of blood chemistry: Secondary | ICD-10-CM | POA: Diagnosis not present

## 2020-04-22 DIAGNOSIS — B952 Enterococcus as the cause of diseases classified elsewhere: Secondary | ICD-10-CM | POA: Diagnosis present

## 2020-04-22 DIAGNOSIS — Z90710 Acquired absence of both cervix and uterus: Secondary | ICD-10-CM | POA: Diagnosis not present

## 2020-04-22 DIAGNOSIS — K859 Acute pancreatitis without necrosis or infection, unspecified: Secondary | ICD-10-CM | POA: Diagnosis not present

## 2020-04-22 DIAGNOSIS — N200 Calculus of kidney: Secondary | ICD-10-CM | POA: Diagnosis not present

## 2020-04-22 DIAGNOSIS — K219 Gastro-esophageal reflux disease without esophagitis: Secondary | ICD-10-CM | POA: Diagnosis not present

## 2020-04-22 DIAGNOSIS — E538 Deficiency of other specified B group vitamins: Secondary | ICD-10-CM | POA: Diagnosis present

## 2020-04-22 DIAGNOSIS — I10 Essential (primary) hypertension: Secondary | ICD-10-CM | POA: Diagnosis not present

## 2020-04-22 DIAGNOSIS — Z9981 Dependence on supplemental oxygen: Secondary | ICD-10-CM | POA: Diagnosis not present

## 2020-04-22 DIAGNOSIS — Z20822 Contact with and (suspected) exposure to covid-19: Secondary | ICD-10-CM | POA: Diagnosis present

## 2020-04-22 DIAGNOSIS — Z85818 Personal history of malignant neoplasm of other sites of lip, oral cavity, and pharynx: Secondary | ICD-10-CM | POA: Diagnosis not present

## 2020-04-22 LAB — LIPID PANEL
Cholesterol: 156 mg/dL (ref 0–200)
HDL: 62 mg/dL (ref >40)
LDL Cholesterol: 87 mg/dL (ref 0–99)
Total CHOL/HDL Ratio: 2.5 RATIO
Triglycerides: 36 mg/dL (ref <150)
VLDL: 7 mg/dL (ref 0–40)

## 2020-04-22 LAB — CBC
HCT: 35.1 % — ABNORMAL LOW (ref 36.0–46.0)
Hemoglobin: 11.3 g/dL — ABNORMAL LOW (ref 12.0–15.0)
MCH: 32.4 pg (ref 26.0–34.0)
MCHC: 32.2 g/dL (ref 30.0–36.0)
MCV: 100.6 fL — ABNORMAL HIGH (ref 80.0–100.0)
Platelets: 245 10*3/uL (ref 150–400)
RBC: 3.49 MIL/uL — ABNORMAL LOW (ref 3.87–5.11)
RDW: 13 % (ref 11.5–15.5)
WBC: 6.4 10*3/uL (ref 4.0–10.5)
nRBC: 0 % (ref 0.0–0.2)

## 2020-04-22 LAB — COMPREHENSIVE METABOLIC PANEL
ALT: 20 U/L (ref 0–44)
AST: 30 U/L (ref 15–41)
Albumin: 3.1 g/dL — ABNORMAL LOW (ref 3.5–5.0)
Alkaline Phosphatase: 177 U/L — ABNORMAL HIGH (ref 38–126)
Anion gap: 8 (ref 5–15)
BUN: 13 mg/dL (ref 8–23)
CO2: 30 mmol/L (ref 22–32)
Calcium: 8.6 mg/dL — ABNORMAL LOW (ref 8.9–10.3)
Chloride: 100 mmol/L (ref 98–111)
Creatinine, Ser: 0.69 mg/dL (ref 0.44–1.00)
GFR calc Af Amer: >60 mL/min (ref >60)
GFR calc non Af Amer: >60 mL/min (ref >60)
Glucose, Bld: 152 mg/dL — ABNORMAL HIGH (ref 70–99)
Potassium: 3.4 mmol/L — ABNORMAL LOW (ref 3.5–5.1)
Sodium: 138 mmol/L (ref 135–145)
Total Bilirubin: 0.5 mg/dL (ref 0.3–1.2)
Total Protein: 6.7 g/dL (ref 6.5–8.1)

## 2020-04-22 LAB — PROTIME-INR
INR: 1.2 (ref 0.8–1.2)
Prothrombin Time: 14.7 seconds (ref 11.4–15.2)

## 2020-04-22 LAB — SARS CORONAVIRUS 2 BY RT PCR (HOSPITAL ORDER, PERFORMED IN ~~LOC~~ HOSPITAL LAB): SARS Coronavirus 2: NEGATIVE

## 2020-04-22 LAB — LACTIC ACID, PLASMA: Lactic Acid, Venous: 1.8 mmol/L (ref 0.5–1.9)

## 2020-04-22 LAB — LACTATE DEHYDROGENASE: LDH: 173 U/L (ref 98–192)

## 2020-04-22 LAB — PHOSPHORUS: Phosphorus: 3.7 mg/dL (ref 2.5–4.6)

## 2020-04-22 LAB — APTT: aPTT: 30 seconds (ref 24–36)

## 2020-04-22 LAB — MAGNESIUM: Magnesium: 1.7 mg/dL (ref 1.7–2.4)

## 2020-04-22 MED ORDER — MORPHINE SULFATE (PF) 4 MG/ML IV SOLN
4.0000 mg | Freq: Once | INTRAVENOUS | Status: AC
  Administered 2020-04-22: 4 mg via INTRAVENOUS
  Filled 2020-04-22: qty 1

## 2020-04-22 MED ORDER — APIXABAN 2.5 MG PO TABS
2.5000 mg | ORAL_TABLET | Freq: Two times a day (BID) | ORAL | Status: DC
  Administered 2020-04-22 – 2020-04-23 (×3): 2.5 mg via ORAL
  Filled 2020-04-22 (×3): qty 1

## 2020-04-22 MED ORDER — ONDANSETRON HCL 4 MG PO TABS: 4.0000 mg | ORAL_TABLET | Freq: Four times a day (QID) | ORAL | Status: DC | PRN

## 2020-04-22 MED ORDER — DONEPEZIL HCL 5 MG PO TABS
10.0000 mg | ORAL_TABLET | Freq: Every day | ORAL | Status: DC
  Administered 2020-04-22: 10 mg via ORAL
  Filled 2020-04-22 (×3): qty 1
  Filled 2020-04-22: qty 2

## 2020-04-22 MED ORDER — LACTATED RINGERS IV SOLN: INTRAVENOUS | Status: AC

## 2020-04-22 MED ORDER — PANTOPRAZOLE SODIUM 40 MG PO TBEC
40.0000 mg | DELAYED_RELEASE_TABLET | Freq: Every day | ORAL | Status: DC
  Administered 2020-04-22 – 2020-04-23 (×2): 40 mg via ORAL
  Filled 2020-04-22 (×2): qty 1

## 2020-04-22 MED ORDER — LACTATED RINGERS IV SOLN: Freq: Once | INTRAVENOUS | Status: AC

## 2020-04-22 MED ORDER — FUROSEMIDE 40 MG PO TABS: 40.0000 mg | ORAL_TABLET | Freq: Two times a day (BID) | ORAL | Status: DC

## 2020-04-22 MED ORDER — SODIUM CHLORIDE 0.9 % IV SOLN
1.0000 g | INTRAVENOUS | Status: DC
  Administered 2020-04-23: 1 g via INTRAVENOUS
  Filled 2020-04-22: qty 10

## 2020-04-22 MED ORDER — IOHEXOL 300 MG/ML  SOLN
100.0000 mL | Freq: Once | INTRAMUSCULAR | Status: AC | PRN
  Administered 2020-04-22: 100 mL via INTRAVENOUS

## 2020-04-22 MED ORDER — MORPHINE SULFATE (PF) 2 MG/ML IV SOLN: 2.0000 mg | INTRAVENOUS | Status: DC | PRN

## 2020-04-22 MED ORDER — CARVEDILOL 3.125 MG PO TABS
3.1250 mg | ORAL_TABLET | Freq: Two times a day (BID) | ORAL | Status: DC
  Administered 2020-04-22 – 2020-04-23 (×4): 3.125 mg via ORAL
  Filled 2020-04-22 (×10): qty 1

## 2020-04-22 MED ORDER — ONDANSETRON HCL 4 MG/2ML IJ SOLN: 4.0000 mg | Freq: Four times a day (QID) | INTRAMUSCULAR | Status: DC | PRN

## 2020-04-22 MED ORDER — ENOXAPARIN SODIUM 40 MG/0.4ML ~~LOC~~ SOLN: 40.0000 mg | SUBCUTANEOUS | Status: DC

## 2020-04-22 MED ORDER — LACTATED RINGERS IV BOLUS
1000.0000 mL | Freq: Once | INTRAVENOUS | Status: AC
  Administered 2020-04-22: 1000 mL via INTRAVENOUS

## 2020-04-22 MED ORDER — VERAPAMIL HCL ER 180 MG PO TBCR
180.0000 mg | EXTENDED_RELEASE_TABLET | Freq: Every day | ORAL | Status: DC
  Administered 2020-04-22 – 2020-04-23 (×2): 180 mg via ORAL
  Filled 2020-04-22 (×5): qty 1

## 2020-04-22 MED ORDER — ACETAMINOPHEN 325 MG PO TABS
650.0000 mg | ORAL_TABLET | Freq: Four times a day (QID) | ORAL | Status: DC | PRN
  Administered 2020-04-22: 650 mg via ORAL
  Filled 2020-04-22: qty 2

## 2020-04-22 NOTE — ED Notes (Signed)
Hospice nurse contactOrvil Feil [phone]

## 2020-04-22 NOTE — H&P (Signed)
History and Physical  [name] Walker LPF:[id number] DOB: 1925/05/13 DOA: 04/21/2020  Referring physician: Delora Fuel, MD PCP: Claretta Fraise, MD  Patient coming from: Nursing facility  Chief Complaint: Abdominal pain  HPI: [name] Walker is a 86 y.o. female with medical history significant for hypertension, COPD, DVT, GERD, ischemic colitis who presents to the emergency department due to abdominal pain that started yesterday.  Pain was diffuse and severe in nature, she endorsed several episodes of emesis prior to arrival to the ED and also reported 3/4 large bowel movements within 24 hours prior to presenting to the emergency department.  She denies any alleviating or aggravating factor and denies fever, chills, shortness of breath or chest pain.    ED Course:  In the emergency department, she was hemodynamically stable.  Work-up in the ED showed macrocytic anemia, lactic acid 2.0> 1.8, lipase 3,168.  Urinalysis showed proteinuria, moderate leukocytes, RBC and WBC > 50 and many bacteria (patient with no complaint of irritative bladder symptoms).  SARS coronavirus 2 was negative.  CT abdomen pelvis showed changes of mild pancreatitis in the pancreatic head.  IV hydration 1 L LR was given, IV fentanyl and morphine due to pain given.  IV Solu-Cortef 200 mg x 1 and IV Benadryl 50 mg x 2 were given.  Patient was empirically started on IV ceftriaxone due to presumed UTI.  Hospitalist was asked to admit patient for further evaluation and management.  Review of Systems: Constitutional: Negative for chills and fever.  HENT: Negative for ear pain and sore throat.   Eyes: Negative for pain and visual disturbance.  Respiratory: Negative for cough, chest tightness and shortness of breath.   Cardiovascular: Negative for chest pain and palpitations.  Gastrointestinal: Positive for abdominal pain, nausea, diarrhea and vomiting.  Endocrine: Negative for polyphagia and polyuria.  Genitourinary:  Negative for decreased urine volume, dysuria Musculoskeletal: Negative for arthralgias and back pain.  Skin: Negative for color change and rash.  Allergic/Immunologic: Negative for immunocompromised state.  Neurological: Negative for tremors, syncope, speech difficulty, weakness, light-headedness and headaches.  Hematological: Does not bruise/bleed easily.  All other systems reviewed and are negative  Stroke work-up Past Medical History:  Diagnosis Date  . Cataract   . COPD (chronic obstructive pulmonary disease) ([place])   . Depression   . DVT (deep venous thrombosis) ([place])   . Gastric erosion   . GERD (gastroesophageal reflux disease)   . Hypertension   . Insomnia   . Ischemic colitis ([place])   . Osteoarthritis   . Parotid mass 2016  . 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 ABDOMINAL HYSTERECTOMY    . BACK SURGERY    . Cataracts    . CHOLECYSTECTOMY    . COLONOSCOPY    . ESOPHAGOGASTRODUODENOSCOPY  10/17/2007   erosion   . KNEE ARTHROPLASTY     bilateral  . PAROTIDECTOMY Left 05/17/2015   Procedure: PAROTIDECTOMY- LEFT TOTAL;  Surgeon: Leta Baptist, MD;  Location: [facility];  Service: ENT;  Laterality: Left;  . TOTAL HIP ARTHROPLASTY     bilateral    Social History:  reports that she has never smoked. She has never used smokeless tobacco. She reports that she does not drink alcohol and does not use drugs.   Allergies  Allergen Reactions  . Diltiazem Anxiety and Other (See Comments)    Sleep disturbance  . Iohexol      Desc: THROAT  SWELLING-NOTED IN CHART AFTER IVC PLACEMENT-ARS 12-02-05   . Latex Other (See Comments)    unknown  . Penicillins Swelling    Patient was given zosyn this admission and tolerated. After d/w MD and pt, she had swelling in arm only, so possible just infiltration and not allergic. ABx changed to Cefazolin and is tolerating  . Shellfish Allergy Other  (See Comments)    Reaction unknown    Family History  Problem Relation Age of Onset  . Rheum arthritis Mother   . Ulcers Mother        on foot  . Ulcers Father   . Cancer Sister        uterine    Prior to Admission medications   Medication Sig Start Date End Date Taking? Authorizing Provider  amitriptyline (ELAVIL) 150 MG tablet TAKE (1) TABLET BY MOUTH AT BEDTIME. Patient taking differently: Take 150 mg by mouth at bedtime.  02/19/20  Yes Stacks, Cletus Gash, MD  apixaban (ELIQUIS) 2.5 MG TABS tablet Take 2.5 mg by mouth 2 (two) times daily.   Yes [provider]  carvedilol (COREG) 3.125 MG tablet TAKE (1) TABLET BY MOUTH TWICE DAILY. Patient taking differently: Take 3.125 mg by mouth 2 (two) times daily with a meal.  12/01/19  Yes Stacks, Cletus Gash, MD  cetirizine (ZYRTEC) 10 MG tablet Take 10 mg by mouth daily.   Yes [provider]  donepezil (ARICEPT) 10 MG tablet TAKE ONE TABLET AT BEDTIME Patient taking differently: Take 10 mg by mouth at bedtime.  06/19/19  Yes Stacks, Cletus Gash, MD  Emollient (EUCERIN) lotion Apply topically every 12 (twelve) hours as needed for dry skin.   Yes [provider]  furosemide (LASIX) 40 MG tablet TAKE (2) TABLETS DAILY Patient taking differently: Take 40 mg by mouth 2 (two) times daily.  06/15/19  Yes Stacks, Cletus Gash, MD  gabapentin (NEURONTIN) 300 MG capsule TAKE  (1)  CAPSULE  TWICE DAILY. Patient taking differently: Take 300 mg by mouth 2 (two) times daily.  06/22/19  Yes Stacks, Cletus Gash, MD  LORazepam (ATIVAN) 0.5 MG tablet Take 1 tablet (0.5 mg total) by mouth at bedtime. Prn sleep, nerves Patient taking differently: Take 0.5 mg by mouth at bedtime.  07/23/19  Yes Stacks, Cletus Gash, MD  Menthol-Zinc Oxide (CALMOSEPTINE) 0.44-20.6 % OINT Apply to buttocks TID Patient taking differently: Apply 1 application topically 3 (three) times daily as needed (to buttocks area). Apply to buttocks TID 05/01/19  Yes Hawks, Christy A, FNP  omeprazole  (PRILOSEC) 20 MG capsule TAKE 2 CAPSULES DAILY AS NEEDED FOR REFLUX Patient taking differently: Take 20 mg by mouth 2 (two) times daily before a meal.  06/05/19  Yes Stacks, Cletus Gash, MD  potassium chloride (KLOR-CON) 10 MEQ tablet TAKE 2 TABLETS BY MOUTH DAILY. Patient taking differently: Take 20 mEq by mouth daily.  02/19/20  Yes Stacks, Cletus Gash, MD  senna (SENOKOT) 8.6 MG tablet Take 1 tablet by mouth in the morning and at bedtime.   Yes [provider]  verapamil (CALAN-SR) 180 MG CR tablet Take 1 tablet (180 mg total) by mouth at bedtime. Patient taking differently: Take 180 mg by mouth daily.  07/01/19  Yes StacksCletus Gash, MD  OXYGEN Inhale 2 L/min into the lungs continuous.  02/06/19   [provider]    Physical Exam: BP 112/71   Pulse 76   Resp 18   Ht 5' (1.524 m)   Wt 72 kg   SpO2 97%   BMI 31.00 kg/m   .  General: 86 y.o. year-old female well developed well nourished in no acute distress.  Alert and oriented x3. Marland Kitchen HEENT: NCAT, EOMI . Neck: Supple, trachea midline . Cardiovascular: Regular rate and rhythm with no rubs or gallops.  No thyromegaly or JVD noted.  No lower extremity edema. 2/4 pulses in all 4 extremities. Marland Kitchen Respiratory: Clear to auscultation with no wheezes or rales. Good inspiratory effort. . Abdomen: Soft, diffuse tenderness to palpation more noted in epigastric, RUQ and periumbilical area. . Muskuloskeletal: No cyanosis, clubbing or edema noted bilaterally . Neuro: CN II-XII intact, strength, sensation, reflexes . Skin: No ulcerative lesions noted or rashes . Psychiatry: Judgement and insight appear normal. Mood is appropriate for condition and setting          Labs on Admission:  Basic Metabolic Panel: Recent Labs  Lab 04/21/20 2137  NA 139  K 3.5  CL 101  CO2 27  GLUCOSE 109*  BUN 16  CREATININE 0.82  CALCIUM 8.4*   Liver Function Tests: Recent Labs  Lab 04/21/20 2137  AST 34  ALT 21  ALKPHOS 180*  BILITOT 0.6  PROT 6.5    ALBUMIN 3.1*   Recent Labs  Lab 04/21/20 2137  LIPASE 3,168*   No results for input(s): AMMONIA in the last 168 hours. CBC: Recent Labs  Lab 04/21/20 2137  WBC 8.5  NEUTROABS 6.5  HGB 11.0*  HCT 34.1*  MCV 100.9*  PLT 259   Cardiac Enzymes: No results for input(s): CKTOTAL, CKMB, CKMBINDEX, TROPONINI in the last 168 hours.  BNP (last 3 results) No results for input(s): BNP in the last 8760 hours.  ProBNP (last 3 results) No results for input(s): PROBNP in the last 8760 hours.  CBG: No results for input(s): GLUCAP in the last 168 hours.  Radiological Exams on Admission: CT ABDOMEN PELVIS W CONTRAST  Result Date: 04/22/2020 CLINICAL DATA:  Epigastric pain for 1 day EXAM: CT ABDOMEN AND PELVIS WITH CONTRAST TECHNIQUE: Multidetector CT imaging of the abdomen and pelvis was performed using the standard protocol following bolus administration of intravenous contrast. CONTRAST:  125mL OMNIPAQUE IOHEXOL 300 MG/ML  SOLN COMPARISON:  11/28/2007, plain film from previous day. FINDINGS: Lower chest: No acute abnormality. Hepatobiliary: No focal liver abnormality is seen. Status post cholecystectomy. Dilatation of the common bile duct is noted consistent with the post cholecystectomy state. Pancreas: Pancreas demonstrates some peripancreatic inflammatory change in the region of the pancreatic head consistent with focal pancreatitis. This is new from the prior study. Spleen: Normal in size without focal abnormality. Adrenals/Urinary Tract: Renal calculi are noted bilaterally without obstructive change. Right-sided renal cyst is seen. No obstructive changes are noted. The bladder is partially distended. Air is noted bladder recent instrumentation. Stomach/Bowel: Colon shows no obstructive or inflammatory changes. The appendix is not well visualized although no inflammatory changes to suggest appendicitis are seen. Stomach and small bowel appear within normal limits. Vascular/Lymphatic: Aortic  atherosclerosis. No enlarged abdominal or pelvic lymph nodes. Reproductive: Status post hysterectomy. No adnexal masses. Other: No abdominal wall hernia or abnormality. No abdominopelvic ascites. Musculoskeletal: Bilateral hip replacements are noted. Degenerative changes of lumbar spine are seen. IMPRESSION: Changes of mild pancreatitis in the pancreatic head. Bilateral renal calculi without obstructive change. Electronically Signed   By: Inez Catalina M.D.   On: 04/22/2020 02:24   DG ABD ACUTE 2+V W 1V CHEST  Result Date: 04/21/2020 CLINICAL DATA:  Abdominal pain and nausea. EXAM: DG ABDOMEN ACUTE W/ 1V CHEST COMPARISON:  None. FINDINGS: Lung volumes  are low. Mild cardiomegaly. Aortic atherosclerosis. Interstitial coarsening which is likely chronic. No focal airspace disease or pleural fluid. Air-filled bowel in the upper abdomen likely represent significant gaseous distention of stomach. No other bowel dilatation. Air in stool throughout the colon in a nonobstructive pattern. There is no evidence of free air. IVC filter in place. There are vascular calcifications. Surgical clips in the right upper quadrant typical of cholecystectomy. Advanced degenerative change in the shoulders. Bilateral hip arthroplasties. Scoliotic curvature in the spine. IMPRESSION: 1. Gaseous distention of stomach. No other bowel dilatation to suggest obstruction. 2. Low lung volumes with mild cardiomegaly and chronic interstitial coarsening. Electronically Signed   By: Keith Rake M.D.   On: 04/21/2020 22:13    EKG: I independently viewed the EKG done and my findings are as followed: EKG was not done in the ED  Assessment/Plan Present on Admission: . Acute pancreatitis . Essential hypertension . Anemia . COPD (chronic obstructive pulmonary disease) ([place]) . GERD . B12 deficiency . Chronic systolic (congestive) heart failure ([place]) . Atrial fibrillation with RVR ([facility])  Principal Problem:   Acute pancreatitis Active  Problems:   Anemia   Essential hypertension   COPD (chronic obstructive pulmonary disease) ([facility])   GERD   Atrial fibrillation with RVR ([facility])   B12 deficiency   Chronic systolic (congestive) heart failure ([facility])   Abdominal pain   Nausea and vomiting   Diarrhea   Abdominal pain, nausea and vomiting secondary to acute pancreatitis Lipase level was 3168 CT abdomen pelvis showed changes of mild pancreatitis in the pancreatic head. Continue IV Zofran p.r.n Continue IV morphine 2 mg every 4 hours as needed p.r.n for pain Continue Protonix Continue IV LR at 29ml/Hr Continue full liquid diet with plan to advance diet as tolerated RUQ U/S in the morning to investigate biliary etiology (gallstone and bile duct dilatation)  Acute diarrhea-improved Patient endorsed 3-4 bowel movements with pain 24 hours PTA to the ED, she has not had any bowel movement since arrival at the ED. Continue to monitor with plan for further work-up if patient continues to have diarrhea.  Macrocytic anemia with history of vitamin B12 deficiency H/H 11/34.1, MCV 100.9 It was noted that she has history of vitamin B-12 deficiency and is on vitamin B12 injection per med rec.  GERD Continue Protonix  COPD (not in acute exacerbation) Continue to monitor and treat accordingly  Chronic systolic CHF/Essential hypertension Continue home meds when med rec is updated  History of A. Fib CHA2DS2-VASc score is 4 points which is equal to 4.8% annual risk of stroke HAS-BLED score = 2 (moderate risk of major bleeding) Continue home Coreg, verapamil and Eliquis when med rec sedated   DVT prophylaxis: Eliquis  Code Status: DNR  Family Communication: None at bedside  Disposition Plan:  Patient is from:                        home Anticipated DC to:                   SNF or family members home Anticipated DC date:               2-3 days Anticipated DC barriers:           Unstable to discharge at this time due to  abdominal pain secondary to acute pancreatitis    Consults called: None  Admission status: Inpatient    Bernadette Hoit MD [facility] Pager [phone]  If 7PM-7AM, please contact night-coverage [email or web address] Password [facility]  04/22/2020, 5:43 AM

## 2020-04-22 NOTE — ED Notes (Signed)
Hospice called to check on pt status.

## 2020-04-22 NOTE — ED Notes (Signed)
ED TO INPATIENT HANDOFF REPORT  ED Nurse Name and Phone #: [phone]  S Name/Age/Gender [name] Walker 86 y.o. female Room/Bed: APA11/APA11  Code Status   Code Status: DNR  Home/SNF/Other Nursing Home Patient oriented to: self Is this baseline? Yes   Triage Complete: Triage complete  Chief Complaint Acute pancreatitis [K85.90]  Triage Note Pt is resident of [facility], today with onset of abd pain and projectile vomiting.  Pt received 4 mg zofran iv per ems.    Allergies Allergies  Allergen Reactions  . Diltiazem Anxiety and Other (See Comments)    Sleep disturbance  . Iohexol      Desc: THROAT SWELLING-NOTED IN CHART AFTER IVC PLACEMENT-ARS 12-02-05   . Latex Other (See Comments)    unknown  . Penicillins Swelling    Patient was given zosyn this admission and tolerated. After d/w MD and pt, she had swelling in arm only, so possible just infiltration and not allergic. ABx changed to Cefazolin and is tolerating  . Shellfish Allergy Other (See Comments)    Reaction unknown    Level of Care/Admitting Diagnosis ED Disposition    ED Disposition Condition Comment   Admit  Hospital Area: [facility] [[id number]]  Level of Care: Med-Surg [16]  Covid Evaluation: Confirmed COVID Negative  Diagnosis: Acute pancreatitis [577.0.ICD-9-CM]  Admitting Physician: Bernadette Hoit [[id number]]  Attending Physician: Bernadette Hoit [[id number]]  Estimated length of stay: past midnight tomorrow  Certification:: I certify this patient will need inpatient services for at least 2 midnights       B Medical/Surgery History Past Medical History:  Diagnosis Date  . Cataract   . COPD (chronic obstructive pulmonary disease) ([place])   . Depression   . DVT (deep venous thrombosis) ([place])   . Gastric erosion   . GERD (gastroesophageal reflux disease)   . Hypertension   . Insomnia   . Ischemic colitis ([place])   . Osteoarthritis   . Parotid mass 2016  . 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 ABDOMINAL HYSTERECTOMY    . BACK SURGERY    . Cataracts    . CHOLECYSTECTOMY    . COLONOSCOPY    . ESOPHAGOGASTRODUODENOSCOPY  10/17/2007   erosion   . KNEE ARTHROPLASTY     bilateral  . PAROTIDECTOMY Left 05/17/2015   Procedure: PAROTIDECTOMY- LEFT TOTAL;  Surgeon: Leta Baptist, MD;  Location: [facility];  Service: ENT;  Laterality: Left;  . TOTAL HIP ARTHROPLASTY     bilateral     A IV Location/Drains/Wounds Patient Lines/Drains/Airways Status    Active Line/Drains/Airways    Name Placement date Placement time Site Days   Peripheral IV 04/21/20 Right Wrist 04/21/20  2051  Wrist  1   Peripheral IV 04/22/20 Left;Posterior Forearm 04/22/20  0016  Forearm  less than 1   External Urinary Catheter 04/22/20  0325  --  less than 1   Pressure Injury 11/20/18 Deep Tissue Injury - Purple or maroon localized area of discolored intact skin or blood-filled blister due to damage of underlying soft tissue from pressure and/or shear. 11/20/18  0345   519          Intake/Output Last 24 hours  Intake/Output Summary (Last 24 hours) at 04/22/2020 1407 Last data filed at 04/22/2020 1338 Gross per 24 hour  Intake 439.78 ml  Output 1800 ml  Net -1360.22 ml    Labs/Imaging Results for orders placed  or performed during the hospital encounter of 04/21/20 (from the past 48 hour(s))  Urinalysis, Routine w reflex microscopic     Status: Abnormal   Collection Time: 04/21/20  9:19 PM  Result Value Ref Range   Color, Urine YELLOW YELLOW   APPearance TURBID (A) CLEAR   Specific Gravity, Urine 1.011 1.005 - 1.030   pH 6.0 5.0 - 8.0   Glucose, UA NEGATIVE NEGATIVE mg/dL   Hgb urine dipstick LARGE (A) NEGATIVE   Bilirubin Urine NEGATIVE NEGATIVE   Ketones, ur NEGATIVE NEGATIVE mg/dL   Protein, ur 100 (A) NEGATIVE mg/dL   Nitrite NEGATIVE NEGATIVE   Leukocytes,Ua MODERATE (A) NEGATIVE   RBC  / HPF >50 (H) 0 - 5 RBC/hpf   WBC, UA >50 (H) 0 - 5 WBC/hpf   Bacteria, UA MANY (A) NONE SEEN   Squamous Epithelial / LPF 6-10 0 - 5   WBC Clumps PRESENT    Mucus PRESENT    Ca Oxalate Crys, UA PRESENT    Non Squamous Epithelial 0-5 (A) NONE SEEN    Comment: Performed at [facility], [address] [place], [place] [address]  Comprehensive metabolic panel     Status: Abnormal   Collection Time: 04/21/20  9:37 PM  Result Value Ref Range   Sodium 139 135 - 145 mmol/L   Potassium 3.5 3.5 - 5.1 mmol/L   Chloride 101 98 - 111 mmol/L   CO2 27 22 - 32 mmol/L   Glucose, Bld 109 (H) 70 - 99 mg/dL    Comment: Glucose reference range applies only to samples taken after fasting for at least 8 hours.   BUN 16 8 - 23 mg/dL   Creatinine, Ser 0.82 0.44 - 1.00 mg/dL   Calcium 8.4 (L) 8.9 - 10.3 mg/dL   Total Protein 6.5 6.5 - 8.1 g/dL   Albumin 3.1 (L) 3.5 - 5.0 g/dL   AST 34 15 - 41 U/L   ALT 21 0 - 44 U/L   Alkaline Phosphatase 180 (H) 38 - 126 U/L   Total Bilirubin 0.6 0.3 - 1.2 mg/dL   GFR calc non Af Amer >60 >60 mL/min   GFR calc Af Amer >60 >60 mL/min   Anion gap 11 5 - 15    Comment: Performed at [facility], [address], [place], [place] [address]  Lipase, blood     Status: Abnormal   Collection Time: 04/21/20  9:37 PM  Result Value Ref Range   Lipase 3,168 (H) 11 - 51 U/L    Comment: RESULTS CONFIRMED BY MANUAL DILUTION Performed at [facility], [address] [place], [place] [address]   CBC with Diff     Status: Abnormal   Collection Time: 04/21/20  9:37 PM  Result Value Ref Range   WBC 8.5 4.0 - 10.5 K/uL   RBC 3.38 (L) 3.87 - 5.11 MIL/uL   Hemoglobin 11.0 (L) 12.0 - 15.0 g/dL   HCT 34.1 (L) 36 - 46 %   MCV 100.9 (H) 80.0 - 100.0 fL   MCH 32.5 26.0 - 34.0 pg   MCHC 32.3 30.0 - 36.0 g/dL   RDW 13.2 11.5 - 15.5 %   Platelets 259 150 - 400 K/uL   nRBC 0.0 0.0 - 0.2 %   Neutrophils Relative % 77 %   Neutro Abs 6.5 1.7 - 7.7 K/uL   Lymphocytes Relative 10 %   Lymphs  Abs 0.8 0.7 - 4.0 K/uL   Monocytes Relative 11 %  Monocytes Absolute 0.9 0 - 1 K/uL   Eosinophils Relative 2 %   Eosinophils Absolute 0.2 0 - 0 K/uL   Basophils Relative 0 %   Basophils Absolute 0.0 0 - 0 K/uL   Immature Granulocytes 0 %   Abs Immature Granulocytes 0.02 0.00 - 0.07 K/uL    Comment: Performed at [facility], [address], [place], [place] [address]  Lactic acid, plasma     Status: Abnormal   Collection Time: 04/21/20  9:37 PM  Result Value Ref Range   Lactic Acid, Venous 2.0 (HH) 0.5 - 1.9 mmol/L    Comment: CRITICAL RESULT CALLED TO, READ BACK BY AND VERIFIED WITH: NORMAN,B AT 2225 ON 7.29.21 BY ISLEY,B Performed at [facility], [address], [place], [place] [address]   Lactic acid, plasma     Status: None   Collection Time: 04/21/20 11:24 PM  Result Value Ref Range   Lactic Acid, Venous 1.8 0.5 - 1.9 mmol/L    Comment: Performed at [facility], [address] [place], [place] [address]  Lactate dehydrogenase     Status: None   Collection Time: 04/21/20 11:24 PM  Result Value Ref Range   LDH 173 98 - 192 U/L    Comment: Performed at [facility], [address] [place], [place] [address]  SARS Coronavirus 2 by RT PCR (hospital order, performed in [place] hospital lab) Nasopharyngeal Nasopharyngeal Swab     Status: None   Collection Time: 04/22/20 12:55 AM   Specimen: Nasopharyngeal Swab  Result Value Ref Range   SARS Coronavirus 2 NEGATIVE NEGATIVE    Comment: (NOTE) SARS-CoV-2 target nucleic acids are NOT DETECTED.  The SARS-CoV-2 RNA is generally detectable in upper and lower respiratory specimens during the acute phase of infection. The lowest concentration of SARS-CoV-2 viral copies this assay can detect is 250 copies / mL. A negative result does not preclude SARS-CoV-2 infection and should not be used as the sole basis for treatment or other patient management decisions.  A negative result may occur with improper specimen collection /  handling, submission of specimen other than nasopharyngeal swab, presence of viral mutation(s) within the areas targeted by this assay, and inadequate number of viral copies (<250 copies / mL). A negative result must be combined with clinical observations, patient history, and epidemiological information.  Fact Sheet for Patients:   StrictlyIdeas.no  Fact Sheet for Healthcare Providers: BankingDealers.co.za  This test is not yet approved or  cleared by the [place] FDA and has been authorized for detection and/or diagnosis of SARS-CoV-2 by FDA under an Emergency Use Authorization (EUA).  This EUA will remain in effect (meaning this test can be used) for the duration of the COVID-19 declaration under Section 564(b)(1) of the Act, 21 U.S.C. section 360bbb-3(b)(1), unless the authorization is terminated or revoked sooner.  Performed at [facility], [address], [place], [place] [address]   Comprehensive metabolic panel     Status: Abnormal   Collection Time: 04/22/20  5:26 AM  Result Value Ref Range   Sodium 138 135 - 145 mmol/L   Potassium 3.4 (L) 3.5 - 5.1 mmol/L   Chloride 100 98 - 111 mmol/L   CO2 30 22 - 32 mmol/L   Glucose, Bld 152 (H) 70 - 99 mg/dL    Comment: Glucose reference range applies only to samples taken after fasting for at least 8 hours.   BUN 13 8 - 23 mg/dL   Creatinine, Ser 0.69 0.44 - 1.00  mg/dL   Calcium 8.6 (L) 8.9 - 10.3 mg/dL   Total Protein 6.7 6.5 - 8.1 g/dL   Albumin 3.1 (L) 3.5 - 5.0 g/dL   AST 30 15 - 41 U/L   ALT 20 0 - 44 U/L   Alkaline Phosphatase 177 (H) 38 - 126 U/L   Total Bilirubin 0.5 0.3 - 1.2 mg/dL   GFR calc non Af Amer >60 >60 mL/min   GFR calc Af Amer >60 >60 mL/min   Anion gap 8 5 - 15    Comment: Performed at [facility], [address] [place], [place] [address]  CBC     Status: Abnormal   Collection Time: 04/22/20  5:26 AM  Result Value Ref Range   WBC 6.4 4.0 - 10.5 K/uL    RBC 3.49 (L) 3.87 - 5.11 MIL/uL   Hemoglobin 11.3 (L) 12.0 - 15.0 g/dL   HCT 35.1 (L) 36 - 46 %   MCV 100.6 (H) 80.0 - 100.0 fL   MCH 32.4 26.0 - 34.0 pg   MCHC 32.2 30.0 - 36.0 g/dL   RDW 13.0 11.5 - 15.5 %   Platelets 245 150 - 400 K/uL   nRBC 0.0 0.0 - 0.2 %    Comment: Performed at [facility], [address], [place], [place] [address]  Protime-INR     Status: None   Collection Time: 04/22/20  5:26 AM  Result Value Ref Range   Prothrombin Time 14.7 11.4 - 15.2 seconds   INR 1.2 0.8 - 1.2    Comment: (NOTE) INR goal varies based on device and disease states. Performed at [facility], [address] [place], [place] [address]   APTT     Status: None   Collection Time: 04/22/20  5:26 AM  Result Value Ref Range   aPTT 30 24 - 36 seconds    Comment: Performed at [facility], [address] [place], [place] [address]  Magnesium     Status: None   Collection Time: 04/22/20  5:26 AM  Result Value Ref Range   Magnesium 1.7 1.7 - 2.4 mg/dL    Comment: Performed at [facility], [address], [place], [place] [address]  Phosphorus     Status: None   Collection Time: 04/22/20  5:26 AM  Result Value Ref Range   Phosphorus 3.7 2.5 - 4.6 mg/dL    Comment: Performed at [facility], [address], [place],  [place] [address]  Lipid panel     Status: None   Collection Time: 04/22/20  5:26 AM  Result Value Ref Range   Cholesterol 156 0 - 200 mg/dL   Triglycerides 36 <150 mg/dL   HDL 62 >40 mg/dL   Total CHOL/HDL Ratio 2.5 RATIO   VLDL 7 0 - 40 mg/dL   LDL Cholesterol 87 0 - 99 mg/dL    Comment:        Total Cholesterol/HDL:CHD Risk Coronary Heart Disease Risk Table                     Men   Women  1/2 Average Risk   3.4   3.3  Average Risk       5.0   4.4  2 X Average Risk   9.6   7.1  3 X Average Risk  23.4   11.0        Use the calculated Patient Ratio above and the CHD Risk Table to determine the patient's CHD Risk.  ATP III CLASSIFICATION (LDL):  <100      mg/dL   Optimal  100-129  mg/dL   Near or Above                    Optimal  130-159  mg/dL   Borderline  160-189  mg/dL   High  >190     mg/dL   Very High Performed at [facility] [place], [place] [address]    CT ABDOMEN PELVIS W CONTRAST  Result Date: 04/22/2020 CLINICAL DATA:  Epigastric pain for 1 day EXAM: CT ABDOMEN AND PELVIS WITH CONTRAST TECHNIQUE: Multidetector CT imaging of the abdomen and pelvis was performed using the standard protocol following bolus administration of intravenous contrast. CONTRAST:  154mL OMNIPAQUE IOHEXOL 300 MG/ML  SOLN COMPARISON:  11/28/2007, plain film from previous day. FINDINGS: Lower chest: No acute abnormality. Hepatobiliary: No focal liver abnormality is seen. Status post cholecystectomy. Dilatation of the common bile duct is noted consistent with the post cholecystectomy state. Pancreas: Pancreas demonstrates some peripancreatic inflammatory change in the region of the pancreatic head consistent with focal pancreatitis. This is new from the prior study. Spleen: Normal in size without focal abnormality. Adrenals/Urinary Tract: Renal calculi are noted bilaterally without obstructive change. Right-sided renal cyst is seen. No obstructive changes are noted. The bladder is partially distended. Air is noted bladder recent instrumentation. Stomach/Bowel: Colon shows no obstructive or inflammatory changes. The appendix is not well visualized although no inflammatory changes to suggest appendicitis are seen. Stomach and small bowel appear within normal limits. Vascular/Lymphatic: Aortic atherosclerosis. No enlarged abdominal or pelvic lymph nodes. Reproductive: Status post hysterectomy. No adnexal masses. Other: No abdominal wall hernia or abnormality. No abdominopelvic ascites. Musculoskeletal: Bilateral hip replacements are noted. Degenerative changes of lumbar spine are seen. IMPRESSION: Changes of mild pancreatitis in the pancreatic head. Bilateral  renal calculi without obstructive change. Electronically Signed   By: Inez Catalina M.D.   On: 04/22/2020 02:24   DG ABD ACUTE 2+V W 1V CHEST  Result Date: 04/21/2020 CLINICAL DATA:  Abdominal pain and nausea. EXAM: DG ABDOMEN ACUTE W/ 1V CHEST COMPARISON:  None. FINDINGS: Lung volumes are low. Mild cardiomegaly. Aortic atherosclerosis. Interstitial coarsening which is likely chronic. No focal airspace disease or pleural fluid. Air-filled bowel in the upper abdomen likely represent significant gaseous distention of stomach. No other bowel dilatation. Air in stool throughout the colon in a nonobstructive pattern. There is no evidence of free air. IVC filter in place. There are vascular calcifications. Surgical clips in the right upper quadrant typical of cholecystectomy. Advanced degenerative change in the shoulders. Bilateral hip arthroplasties. Scoliotic curvature in the spine. IMPRESSION: 1. Gaseous distention of stomach. No other bowel dilatation to suggest obstruction. 2. Low lung volumes with mild cardiomegaly and chronic interstitial coarsening. Electronically Signed   By: Keith Rake M.D.   On: 04/21/2020 22:13   US Abdomen Limited RUQ  Result Date: 04/22/2020 CLINICAL DATA:  Abdominal pain.  Elevated LFTs EXAM: ULTRASOUND ABDOMEN LIMITED RIGHT UPPER QUADRANT COMPARISON:  CT today FINDINGS: Gallbladder: Prior cholecystectomy Common bile duct: Diameter: Difficult to visualize due to overlying bowel gas, dilated on CT. See CT report. Liver: No focal lesion identified. Within normal limits in parenchymal echogenicity. Portal vein is patent on color Doppler imaging with normal direction of blood flow towards the liver. Other: None IMPRESSION: Limited study due to overlying bowel gas. Prior cholecystectomy. See CT report from today for further evaluation Electronically Signed   By: Lennette Bihari  [place] M.D.   On: 04/22/2020 11:18    Pending Labs Unresulted Labs (From admission, onward) Comment           Start     Ordered   04/23/20 0500  CBC  Tomorrow morning,   R        04/22/20 1328   04/23/20 0500  Comprehensive metabolic panel  Tomorrow morning,   R        04/22/20 1328   04/23/20 0500  Lipase, blood  Tomorrow morning,   R        04/22/20 1328   04/21/20 2239  Urine culture  ONCE - STAT,   STAT        04/21/20 2238          Vitals/Pain Today's Vitals   04/22/20 1054 04/22/20 1056 04/22/20 1330 04/22/20 1331  BP:   (!) 111/86   Pulse: 91   78  Resp:   23 17  SpO2:    98%  Weight:      Height:      PainSc:  5       Isolation Precautions No active isolations  Medications Medications  lactated ringers infusion ( Intravenous Stopped 04/22/20 0745)  ondansetron (ZOFRAN) tablet 4 mg (has no administration in time range)    Or  ondansetron (ZOFRAN) injection 4 mg (has no administration in time range)  morphine 2 MG/ML injection 2 mg (has no administration in time range)  carvedilol (COREG) tablet 3.125 mg (3.125 mg Oral Given 04/22/20 1054)  verapamil (CALAN-SR) CR tablet 180 mg (180 mg Oral Given 04/22/20 1054)  donepezil (ARICEPT) tablet 10 mg (has no administration in time range)  pantoprazole (PROTONIX) EC tablet 40 mg (40 mg Oral Given 04/22/20 1054)  apixaban (ELIQUIS) tablet 2.5 mg (2.5 mg Oral Given 04/22/20 1054)  lactated ringers infusion ( Intravenous New Bag/Given 04/22/20 1338)  fentaNYL (SUBLIMAZE) injection 50 mcg (50 mcg Intravenous Given 04/21/20 2127)  ondansetron (ZOFRAN) injection 4 mg (4 mg Intravenous Given 04/21/20 2127)  hydrocortisone sodium succinate (SOLU-CORTEF) injection 200 mg (200 mg Intravenous Given 04/21/20 2204)  diphenhydrAMINE (BENADRYL) capsule 50 mg ( Oral See Alternative 04/22/20 0057)    Or  diphenhydrAMINE (BENADRYL) injection 50 mg (50 mg Intravenous Given 04/22/20 0057)  cefTRIAXone (ROCEPHIN) 1 g in sodium chloride 0.9 % 100 mL IVPB (0 g Intravenous Stopped 04/22/20 0057)  lactated ringers bolus 1,000 mL (0 mLs Intravenous Stopped 04/22/20  0324)  iohexol (OMNIPAQUE) 300 MG/ML solution 100 mL (100 mLs Intravenous Contrast Given 04/22/20 0159)  morphine 4 MG/ML injection 4 mg (4 mg Intravenous Given 04/22/20 0308)  lactated ringers infusion ( Intravenous Stopped 04/22/20 1338)    Mobility non-ambulatory Moderate fall risk   Focused Assessments    R Recommendations: See Admitting Provider Note  Report given to:   Additional Notes:

## 2020-04-22 NOTE — Progress Notes (Signed)
Per HPI: [name] Walker is a 86 y.o. female with medical history significant for hypertension, COPD, DVT, GERD, ischemic colitis who presents to the emergency department due to abdominal pain that started yesterday.  Pain was diffuse and severe in nature, she endorsed several episodes of emesis prior to arrival to the ED and also reported 3/4 large bowel movements within 24 hours prior to presenting to the emergency department.  She denies any alleviating or aggravating factor and denies fever, chills, shortness of breath or chest pain.    ED Course:  In the emergency department, she was hemodynamically stable.  Work-up in the ED showed macrocytic anemia, lactic acid 2.0> 1.8, lipase 3,168.  Urinalysis showed proteinuria, moderate leukocytes, RBC and WBC > 50 and many bacteria (patient with no complaint of irritative bladder symptoms).  SARS coronavirus 2 was negative.  CT abdomen pelvis showed changes of mild pancreatitis in the pancreatic head.  IV hydration 1 L LR was given, IV fentanyl and morphine due to pain given.  IV Solu-Cortef 200 mg x 1 and IV Benadryl 50 mg x 2 were given.  Patient was empirically started on IV ceftriaxone due to presumed UTI.  Hospitalist was asked to admit patient for further evaluation and management.  -Patient admitted with pancreatitis of unknown source.  She does not have elevated triglycerides and right upper quadrant ultrasound without any significant findings.  No significant findings noted on CT abdomen either.  GI has evaluated patient with increasing IV fluid rate for now.  There appear to be no medications that may be causing this and patient denies any new medicines that have been initiated.  Plan to continue to follow labs in a.m.  If LFTs are increased she may need MRCP.  Continue Rocephin for UTI.  Total CARE time: 30 minutes.

## 2020-04-22 NOTE — Consult Note (Addendum)
Referring Provider: No ref. provider found Primary Care Physician:  Claretta Fraise, MD Primary Gastroenterologist:  Dr. Laurence Spates [facility])  Date of Admission: 04/21/20 Date of Consultation: 04/22/20  Reason for Consultation:  Acute pancreatitis  HPI:  [name] Walker is a 86 y.o. 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  Of note, no apparent history of chronic kidney disease or heart disease.  Patient's last echocardiogram completed in 2020 with mildly reduced EF of 45 to 50%.  The patient has not been seen by gastroenterology that we can see in our system since 2012.  Patient currently resides in a nursing facility.  Apparently has home hospice, as a home hospice nurse came to see her in the emergency department when I was seeing her.  The patient presented with a chief complaint of abdominal pain.  She states the pain started initially about a week ago, became severe this morning associated with vomiting prior to emergency department arrival.  Denies any obvious recent hematochezia or melena.  Denies alcohol use, smoking, recent changes to her medication.  She did have a cholecystectomy "years ago" and, that she is aware of, has never had an issue with obstructing gallstones.  She was hemodynamically stable in the emergency department.  Noted macrocytic anemia.  SARS-CoV-2 was negative.  Lipase significantly elevated at 3168.  CMP with mildly elevated alkaline phosphatase at 180 (appears stable compared to elevation since 02/05/2019).  Remotely she has had a normal alkaline phosphatase.  However, her transaminases are normal and her bilirubin is normal.  CT of the abdomen and pelvis demonstrated some peripancreatic inflammatory changes in the region of the pancreatic head consistent with focal pancreatitis.  Hepatobiliary with no focal liver abnormality, status post  cholecystectomy and dilation of the common bile duct consistent with postcholecystectomy state.  Dr. Laural Golden and myself reviewed the images and per his measurement CBD appears greater than 10 mm.  Plans to review with the radiologist.  Follow-up ultrasound this morning a technically difficult study (per discussion with the ultrasound technologist) and results noted limited study, prior cholecystectomy.  Prior discussion she estimated the CBD at 5 mm, but again notes to technical limitations.  Review of labs this morning show persistently elevated alk phos at 177, transaminases and bilirubin still remain normal.  Creatinine normal at 0.69, hypocalcemia at 8.6, white blood cell count normal at 6.4, stable hemoglobin at 11.3 and hematocrit also mildly depressed at 35.1.  Lipid panel today shows normal triglycerides at 36.  Today she states her pain was quite severe this morning and associated with vomiting.  She has never had pain this bad before.  No nausea or vomiting since emergency department evaluation.  Still with discomfort, especially on palpation.  Abdominal pain is more right upper quadrant rather than periumbilical.  Again denies any recent hematochezia or melena, no overt GERD symptoms.  Again confirms she does not drink, does not smoke, does not use any drugs.  No other overt GI complaints.  She is amenable to admission for treatment.  We discussed the routine course of pancreatitis treatment including fluids, medications, n.p.o. status until improvement.  Past Medical History:  Diagnosis Date  . Cataract   . COPD (chronic obstructive pulmonary disease) ([place])   . Depression   . DVT (deep venous thrombosis) ([place])   . Gastric erosion   . GERD (gastroesophageal reflux disease)   . Hypertension   .  Insomnia   . Ischemic colitis ([place])   . Osteoarthritis   . Parotid mass 2016  . Primary squamous cell carcinoma of parotid gland ([place]) 07/25/2015    Past Surgical History:  Procedure  Laterality Date  . ABDOMINAL HYSTERECTOMY    . BACK SURGERY    . Cataracts    . CHOLECYSTECTOMY    . COLONOSCOPY    . ESOPHAGOGASTRODUODENOSCOPY  10/17/2007   erosion   . KNEE ARTHROPLASTY     bilateral  . PAROTIDECTOMY Left 05/17/2015   Procedure: PAROTIDECTOMY- LEFT TOTAL;  Surgeon: Leta Baptist, MD;  Location: [facility];  Service: ENT;  Laterality: Left;  . TOTAL HIP ARTHROPLASTY     bilateral    Prior to Admission medications   Medication Sig Start Date End Date Taking? Authorizing Provider  amitriptyline (ELAVIL) 150 MG tablet TAKE (1) TABLET BY MOUTH AT BEDTIME. Patient taking differently: Take 150 mg by mouth at bedtime.  02/19/20  Yes Stacks, Cletus Gash, MD  apixaban (ELIQUIS) 2.5 MG TABS tablet Take 2.5 mg by mouth 2 (two) times daily.   Yes [provider]  carvedilol (COREG) 3.125 MG tablet TAKE (1) TABLET BY MOUTH TWICE DAILY. Patient taking differently: Take 3.125 mg by mouth 2 (two) times daily with a meal.  12/01/19  Yes Stacks, Cletus Gash, MD  cetirizine (ZYRTEC) 10 MG tablet Take 10 mg by mouth daily.   Yes [provider]  donepezil (ARICEPT) 10 MG tablet TAKE ONE TABLET AT BEDTIME Patient taking differently: Take 10 mg by mouth at bedtime.  06/19/19  Yes Stacks, Cletus Gash, MD  Emollient (EUCERIN) lotion Apply topically every 12 (twelve) hours as needed for dry skin.   Yes [provider]  furosemide (LASIX) 40 MG tablet TAKE (2) TABLETS DAILY Patient taking differently: Take 40 mg by mouth 2 (two) times daily.  06/15/19  Yes Stacks, Cletus Gash, MD  gabapentin (NEURONTIN) 300 MG capsule TAKE  (1)  CAPSULE  TWICE DAILY. Patient taking differently: Take 300 mg by mouth 2 (two) times daily.  06/22/19  Yes Stacks, Cletus Gash, MD  LORazepam (ATIVAN) 0.5 MG tablet Take 1 tablet (0.5 mg total) by mouth at bedtime. Prn sleep, nerves Patient taking differently: Take 0.5 mg by mouth at bedtime.  07/23/19  Yes Stacks, Cletus Gash, MD  Menthol-Zinc Oxide (CALMOSEPTINE)  0.44-20.6 % OINT Apply to buttocks TID Patient taking differently: Apply 1 application topically 3 (three) times daily as needed (to buttocks area). Apply to buttocks TID 05/01/19  Yes Hawks, Christy A, FNP  omeprazole (PRILOSEC) 20 MG capsule TAKE 2 CAPSULES DAILY AS NEEDED FOR REFLUX Patient taking differently: Take 20 mg by mouth 2 (two) times daily before a meal.  06/05/19  Yes Stacks, Cletus Gash, MD  potassium chloride (KLOR-CON) 10 MEQ tablet TAKE 2 TABLETS BY MOUTH DAILY. Patient taking differently: Take 20 mEq by mouth daily.  02/19/20  Yes Stacks, Cletus Gash, MD  senna (SENOKOT) 8.6 MG tablet Take 1 tablet by mouth in the morning and at bedtime.   Yes [provider]  verapamil (CALAN-SR) 180 MG CR tablet Take 1 tablet (180 mg total) by mouth at bedtime. Patient taking differently: Take 180 mg by mouth daily.  07/01/19  Yes StacksCletus Gash, MD  OXYGEN Inhale 2 L/min into the lungs continuous.  02/06/19   [provider]    Current Facility-Administered Medications  Medication Dose Route Frequency Provider Last Rate Last Admin  . apixaban (ELIQUIS) tablet 2.5 mg  2.5 mg Oral BID Bernadette Hoit, DO  2.5 mg at 04/22/20 1054  . carvedilol (COREG) tablet 3.125 mg  3.125 mg Oral BID WC Adefeso, Oladapo, DO   3.125 mg at 04/22/20 1054  . cyanocobalamin ((VITAMIN B-12)) injection 1,000 mcg  1,000 mcg Intramuscular Q30 days Claretta Fraise, MD   1,000 mcg at 04/24/18 1154  . donepezil (ARICEPT) tablet 10 mg  10 mg Oral QHS Adefeso, Oladapo, DO      . morphine 2 MG/ML injection 2 mg  2 mg Intravenous Q4H PRN Adefeso, Oladapo, DO      . ondansetron (ZOFRAN) tablet 4 mg  4 mg Oral Q6H PRN Adefeso, Oladapo, DO       Or  . ondansetron (ZOFRAN) injection 4 mg  4 mg Intravenous Q6H PRN Adefeso, Oladapo, DO      . pantoprazole (PROTONIX) EC tablet 40 mg  40 mg Oral Daily Adefeso, Oladapo, DO   40 mg at 04/22/20 1054  . verapamil (CALAN-SR) CR tablet 180 mg  180 mg Oral Daily Adefeso, Oladapo, DO    180 mg at 04/22/20 1054   Current Outpatient Medications  Medication Sig Dispense Refill  . amitriptyline (ELAVIL) 150 MG tablet TAKE (1) TABLET BY MOUTH AT BEDTIME. (Patient taking differently: Take 150 mg by mouth at bedtime. ) 15 tablet 0  . apixaban (ELIQUIS) 2.5 MG TABS tablet Take 2.5 mg by mouth 2 (two) times daily.    . carvedilol (COREG) 3.125 MG tablet TAKE (1) TABLET BY MOUTH TWICE DAILY. (Patient taking differently: Take 3.125 mg by mouth 2 (two) times daily with a meal. ) 30 tablet 0  . cetirizine (ZYRTEC) 10 MG tablet Take 10 mg by mouth daily.    Marland Kitchen donepezil (ARICEPT) 10 MG tablet TAKE ONE TABLET AT BEDTIME (Patient taking differently: Take 10 mg by mouth at bedtime. ) 30 tablet 0  . Emollient (EUCERIN) lotion Apply topically every 12 (twelve) hours as needed for dry skin.    . furosemide (LASIX) 40 MG tablet TAKE (2) TABLETS DAILY (Patient taking differently: Take 40 mg by mouth 2 (two) times daily. ) 60 tablet 0  . gabapentin (NEURONTIN) 300 MG capsule TAKE  (1)  CAPSULE  TWICE DAILY. (Patient taking differently: Take 300 mg by mouth 2 (two) times daily. ) 60 capsule 0  . LORazepam (ATIVAN) 0.5 MG tablet Take 1 tablet (0.5 mg total) by mouth at bedtime. Prn sleep, nerves (Patient taking differently: Take 0.5 mg by mouth at bedtime. ) 30 tablet 1  . Menthol-Zinc Oxide (CALMOSEPTINE) 0.44-20.6 % OINT Apply to buttocks TID (Patient taking differently: Apply 1 application topically 3 (three) times daily as needed (to buttocks area). Apply to buttocks TID) 113 g 2  . omeprazole (PRILOSEC) 20 MG capsule TAKE 2 CAPSULES DAILY AS NEEDED FOR REFLUX (Patient taking differently: Take 20 mg by mouth 2 (two) times daily before a meal. ) 180 capsule 0  . potassium chloride (KLOR-CON) 10 MEQ tablet TAKE 2 TABLETS BY MOUTH DAILY. (Patient taking differently: Take 20 mEq by mouth daily. ) 30 tablet 0  . senna (SENOKOT) 8.6 MG tablet Take 1 tablet by mouth in the morning and at bedtime.    .  verapamil (CALAN-SR) 180 MG CR tablet Take 1 tablet (180 mg total) by mouth at bedtime. (Patient taking differently: Take 180 mg by mouth daily. ) 90 tablet 0  . OXYGEN Inhale 2 L/min into the lungs continuous.       Allergies as of 04/21/2020 - Review Complete 04/21/2020  Allergen Reaction Noted  .  Diltiazem Anxiety and Other (See Comments) 04/06/2016  . Iohexol  12/02/2005  . Latex Other (See Comments) 01/10/2009  . Penicillins Swelling 01/10/2009  . Shellfish allergy Other (See Comments) 11/28/2012    Family History  Problem Relation Age of Onset  . Rheum arthritis Mother   . Ulcers Mother        on foot  . Ulcers Father   . Cancer Sister        uterine    Social History   Socioeconomic History  . Marital status: Widowed    Spouse name: Not on file  . Number of children: Not on file  . Years of education: Not on file  . Highest education level: Not on file  Occupational History  . Not on file  Tobacco Use  . Smoking status: Never Smoker  . Smokeless tobacco: Never Used  Vaping Use  . Vaping Use: Never used  Substance and Sexual Activity  . Alcohol use: No  . Drug use: No  . Sexual activity: Never    Birth control/protection: Surgical, Post-menopausal  Other Topics Concern  . Not on file  Social History Narrative   Lives home alone. Neighbor and daughter check in on patient    Social Determinants of Health   Financial Resource Strain:   . Difficulty of Paying Living Expenses:   Food Insecurity:   . Worried About Charity fundraiser in the Last Year:   . Arboriculturist in the Last Year:   Transportation Needs:   . Film/video editor (Medical):   Marland Kitchen Lack of Transportation (Non-Medical):   Physical Activity:   . Days of Exercise per Week:   . Minutes of Exercise per Session:   Stress:   . Feeling of Stress :   Social Connections:   . Frequency of Communication with Friends and Family:   . Frequency of Social Gatherings with Friends and Family:   .  Attends Religious Services:   . Active Member of Clubs or Organizations:   . Attends Archivist Meetings:   Marland Kitchen Marital Status:   Intimate Partner Violence:   . Fear of Current or Ex-Partner:   . Emotionally Abused:   Marland Kitchen Physically Abused:   . Sexually Abused:     Review of Systems: General: Negative for anorexia, weight loss, fever, chills, fatigue, weakness. ENT: Negative for hoarseness, difficulty swallowing CV: Negative for chest pain, angina, palpitations, peripheral edema.  Respiratory: Negative for dyspnea at rest,cough, sputum, wheezing.  GI: See history of present illness. Endo: Negative for unusual weight change.  Heme: Negative for bruising or bleeding. Allergy: Negative for rash or hives.  Physical Exam: Vital signs in last 24 hours: Pulse Rate:  [41-91] 91 (07/30 1054) Resp:  [14-19] 14 (07/30 0800) BP: (112-147)/(57-74) 127/72 (07/30 0800) SpO2:  [95 %-98 %] 97 % (07/30 0800) Weight:  [72 kg] 72 kg (07/29 2045)   General:   Alert,  Well-developed, well-nourished, pleasant and cooperative in NAD Head:  Normocephalic and atraumatic. Ears:  Normal auditory acuity. Nose:  No deformity, discharge, or lesions. Lungs:  Clear throughout to auscultation. No wheezes, crackles, or rhonchi. No acute distress. Heart:  Regular rate and rhythm; no murmurs, clicks, rubs,  or gallops. Abdomen:  Soft and nondistended. Significant TTP noted. No masses, hepatosplenomegaly or hernias noted. Normal bowel sounds, without guarding, and without rebound.   Rectal:  Deferred.   Msk:  Symmetrical without gross deformities. Pulses:  Normal bilateral DP pulses noted. Extremities:  Without clubbing or edema. Neurologic:  Alert and  oriented x4;  grossly normal neurologically. Psych:  Alert and cooperative. Normal mood and affect.  Intake/Output from previous day: No intake/output data recorded. Intake/Output this shift: Total I/O In: -  Out: 1800 [Urine:1800]  Lab  Results: Recent Labs    04/21/20 2137 04/22/20 0526  WBC 8.5 6.4  HGB 11.0* 11.3*  HCT 34.1* 35.1*  PLT 259 245   BMET Recent Labs    04/21/20 2137 04/22/20 0526  NA 139 138  K 3.5 3.4*  CL 101 100  CO2 27 30  GLUCOSE 109* 152*  BUN 16 13  CREATININE 0.82 0.69  CALCIUM 8.4* 8.6*   LFT Recent Labs    04/21/20 2137 04/22/20 0526  PROT 6.5 6.7  ALBUMIN 3.1* 3.1*  AST 34 30  ALT 21 20  ALKPHOS 180* 177*  BILITOT 0.6 0.5   PT/INR Recent Labs    04/22/20 0526  LABPROT 14.7  INR 1.2   Hepatitis Panel No results for input(s): HEPBSAG, HCVAB, HEPAIGM, HEPBIGM in the last 72 hours. C-Diff No results for input(s): CDIFFTOX in the last 72 hours.  Studies/Results: CT ABDOMEN PELVIS W CONTRAST  Result Date: 04/22/2020 CLINICAL DATA:  Epigastric pain for 1 day EXAM: CT ABDOMEN AND PELVIS WITH CONTRAST TECHNIQUE: Multidetector CT imaging of the abdomen and pelvis was performed using the standard protocol following bolus administration of intravenous contrast. CONTRAST:  137m OMNIPAQUE IOHEXOL 300 MG/ML  SOLN COMPARISON:  11/28/2007, plain film from previous day. FINDINGS: Lower chest: No acute abnormality. Hepatobiliary: No focal liver abnormality is seen. Status post cholecystectomy. Dilatation of the common bile duct is noted consistent with the post cholecystectomy state. Pancreas: Pancreas demonstrates some peripancreatic inflammatory change in the region of the pancreatic head consistent with focal pancreatitis. This is new from the prior study. Spleen: Normal in size without focal abnormality. Adrenals/Urinary Tract: Renal calculi are noted bilaterally without obstructive change. Right-sided renal cyst is seen. No obstructive changes are noted. The bladder is partially distended. Air is noted bladder recent instrumentation. Stomach/Bowel: Colon shows no obstructive or inflammatory changes. The appendix is not well visualized although no inflammatory changes to suggest  appendicitis are seen. Stomach and small bowel appear within normal limits. Vascular/Lymphatic: Aortic atherosclerosis. No enlarged abdominal or pelvic lymph nodes. Reproductive: Status post hysterectomy. No adnexal masses. Other: No abdominal wall hernia or abnormality. No abdominopelvic ascites. Musculoskeletal: Bilateral hip replacements are noted. Degenerative changes of lumbar spine are seen. IMPRESSION: Changes of mild pancreatitis in the pancreatic head. Bilateral renal calculi without obstructive change. Electronically Signed   By: MInez CatalinaM.D.   On: 04/22/2020 02:24   DG ABD ACUTE 2+V W 1V CHEST  Result Date: 04/21/2020 CLINICAL DATA:  Abdominal pain and nausea. EXAM: DG ABDOMEN ACUTE W/ 1V CHEST COMPARISON:  None. FINDINGS: Lung volumes are low. Mild cardiomegaly. Aortic atherosclerosis. Interstitial coarsening which is likely chronic. No focal airspace disease or pleural fluid. Air-filled bowel in the upper abdomen likely represent significant gaseous distention of stomach. No other bowel dilatation. Air in stool throughout the colon in a nonobstructive pattern. There is no evidence of free air. IVC filter in place. There are vascular calcifications. Surgical clips in the right upper quadrant typical of cholecystectomy. Advanced degenerative change in the shoulders. Bilateral hip arthroplasties. Scoliotic curvature in the spine. IMPRESSION: 1. Gaseous distention of stomach. No other bowel dilatation to suggest obstruction. 2. Low lung volumes with mild cardiomegaly and chronic interstitial coarsening. Electronically Signed  By: Keith Rake M.D.   On: 04/21/2020 22:13   US Abdomen Limited RUQ  Result Date: 04/22/2020 CLINICAL DATA:  Abdominal pain.  Elevated LFTs EXAM: ULTRASOUND ABDOMEN LIMITED RIGHT UPPER QUADRANT COMPARISON:  CT today FINDINGS: Gallbladder: Prior cholecystectomy Common bile duct: Diameter: Difficult to visualize due to overlying bowel gas, dilated on CT. See CT  report. Liver: No focal lesion identified. Within normal limits in parenchymal echogenicity. Portal vein is patent on color Doppler imaging with normal direction of blood flow towards the liver. Other: None IMPRESSION: Limited study due to overlying bowel gas. Prior cholecystectomy. See CT report from today for further evaluation Electronically Signed   By: Rolm Baptise M.D.   On: 04/22/2020 11:18    Impression: Very pleasant 86 year old female who presented from her nursing facility with acute onset abdominal pain, nausea, vomiting.  Overall work-up consistent with acute pancreatitis including lipase greater than 3000.    Acute pancreatitis- Status post cholecystectomy state with some mild dilation of the CBD, exact extent of dilation uncertain and will be further cleared up with review with the radiologist.  Has never had acute pancreatitis before that she is aware of.  She has had some mild stable alkaline phosphatase elevation over the past year.  When considering differentials should be noted she does not smoke, drink, do drugs.  No recent significant medication changes that she is aware of.  Given her acute pancreatitis and possible CBD dilation with mild alk phos elevation over the past year small pancreatic malignancy cannot be excluded.  Less likely stone obstruction given no stones evident, normal transaminases with still persistently elevated lipase.  Triglycerides are normal.  Possible idiopathic.  First episode of acute pancreatitis is late in life with some dilation of CBD is somewhat concerning.  Further evaluation will be needed.  Research does recommend aggressive hydration as much as 5 to 10 mL/kg/h (approximarly 350-700 mL/hr) with caution/contraindication in known heart and kidney disorders (? Mild CHF but EF 45-50%) to reduce mortality.  While I feel she should have some significant hydration, monitored by hematocrit and BUN as typical, given her age I am hesitant to go to high.  I will  discuss specifics with Dr. Laural Golden to settle on a reasonable rate (consider 150-200 mL/hour).  Plan: 1. NPO for now 2. Pain and nausea management per hosptialist 3. Will need aggressive hydration, exact rate to be decided shortly 4. Supportive measures 5. Monitor of any worsening   Thank you for allowing [place] to participate in the care of [facility]  Walden Field, DNP, AGNP-C Adult & Gerontological Nurse Practitioner [facility]    LOS: 0 days     04/22/2020, 12:03 PM

## 2020-04-23 DIAGNOSIS — K219 Gastro-esophageal reflux disease without esophagitis: Secondary | ICD-10-CM

## 2020-04-23 DIAGNOSIS — E538 Deficiency of other specified B group vitamins: Secondary | ICD-10-CM

## 2020-04-23 DIAGNOSIS — I4891 Unspecified atrial fibrillation: Secondary | ICD-10-CM

## 2020-04-23 DIAGNOSIS — K649 Unspecified hemorrhoids: Secondary | ICD-10-CM

## 2020-04-23 DIAGNOSIS — I5022 Chronic systolic (congestive) heart failure: Secondary | ICD-10-CM

## 2020-04-23 DIAGNOSIS — K859 Acute pancreatitis without necrosis or infection, unspecified: Secondary | ICD-10-CM

## 2020-04-23 DIAGNOSIS — J449 Chronic obstructive pulmonary disease, unspecified: Secondary | ICD-10-CM

## 2020-04-23 DIAGNOSIS — I10 Essential (primary) hypertension: Secondary | ICD-10-CM

## 2020-04-23 LAB — CBC
HCT: 32.7 % — ABNORMAL LOW (ref 36.0–46.0)
Hemoglobin: 10.5 g/dL — ABNORMAL LOW (ref 12.0–15.0)
MCH: 31.9 pg (ref 26.0–34.0)
MCHC: 32.1 g/dL (ref 30.0–36.0)
MCV: 99.4 fL (ref 80.0–100.0)
Platelets: 235 10*3/uL (ref 150–400)
RBC: 3.29 MIL/uL — ABNORMAL LOW (ref 3.87–5.11)
RDW: 13 % (ref 11.5–15.5)
WBC: 7.4 10*3/uL (ref 4.0–10.5)
nRBC: 0 % (ref 0.0–0.2)

## 2020-04-23 LAB — COMPREHENSIVE METABOLIC PANEL
ALT: 16 U/L (ref 0–44)
AST: 24 U/L (ref 15–41)
Albumin: 2.9 g/dL — ABNORMAL LOW (ref 3.5–5.0)
Alkaline Phosphatase: 145 U/L — ABNORMAL HIGH (ref 38–126)
Anion gap: 10 (ref 5–15)
BUN: 12 mg/dL (ref 8–23)
CO2: 32 mmol/L (ref 22–32)
Calcium: 8.8 mg/dL — ABNORMAL LOW (ref 8.9–10.3)
Chloride: 100 mmol/L (ref 98–111)
Creatinine, Ser: 0.68 mg/dL (ref 0.44–1.00)
GFR calc Af Amer: >60 mL/min (ref >60)
GFR calc non Af Amer: >60 mL/min (ref >60)
Glucose, Bld: 91 mg/dL (ref 70–99)
Potassium: 3.1 mmol/L — ABNORMAL LOW (ref 3.5–5.1)
Sodium: 142 mmol/L (ref 135–145)
Total Bilirubin: 0.7 mg/dL (ref 0.3–1.2)
Total Protein: 6 g/dL — ABNORMAL LOW (ref 6.5–8.1)

## 2020-04-23 LAB — LIPASE, BLOOD: Lipase: 117 U/L — ABNORMAL HIGH (ref 11–51)

## 2020-04-23 MED ORDER — POTASSIUM CHLORIDE CRYS ER 20 MEQ PO TBCR
40.0000 meq | EXTENDED_RELEASE_TABLET | Freq: Two times a day (BID) | ORAL | Status: DC
  Administered 2020-04-23: 40 meq via ORAL
  Filled 2020-04-23: qty 2

## 2020-04-23 MED ORDER — POTASSIUM CHLORIDE CRYS ER 20 MEQ PO TBCR: 40.0000 meq | EXTENDED_RELEASE_TABLET | Freq: Once | ORAL | Status: DC

## 2020-04-23 MED ORDER — HALOPERIDOL LACTATE 5 MG/ML IJ SOLN: 1.0000 mg | Freq: Four times a day (QID) | INTRAMUSCULAR | Status: DC | PRN

## 2020-04-23 MED ORDER — LORAZEPAM 0.5 MG PO TABS: 0.5000 mg | ORAL_TABLET | Freq: Every day | ORAL | 0 refills | Status: AC

## 2020-04-23 MED ORDER — LACTATED RINGERS IV SOLN: INTRAVENOUS | Status: DC

## 2020-04-23 MED ORDER — GABAPENTIN 300 MG PO CAPS: 300.0000 mg | ORAL_CAPSULE | Freq: Two times a day (BID) | ORAL | 0 refills | Status: AC

## 2020-04-23 MED ORDER — ALPRAZOLAM 0.25 MG PO TABS
0.2500 mg | ORAL_TABLET | Freq: Three times a day (TID) | ORAL | Status: DC | PRN
  Administered 2020-04-23: 0.25 mg via ORAL
  Filled 2020-04-23: qty 1

## 2020-04-23 MED ORDER — CEFDINIR 300 MG PO CAPS
300.0000 mg | ORAL_CAPSULE | Freq: Two times a day (BID) | ORAL | 0 refills | Status: AC
Start: 2020-04-23 — End: 2020-04-26

## 2020-04-23 NOTE — Progress Notes (Addendum)
[name] Walker, M.D. Gastroenterology & Hepatology [place], [place] [address]  Interval History:  The patient reports feeling well today, states that she is hungry. Reports that she does not have any abdominal pain.  Has not requested any pain medication since yesterday in the early morning.  She states that she wants to go home soon as possible.  Denies any nausea, vomiting, fever, chills, abdominal distention or change in her bowel movements. Liver ultrasound yesterday was limited due to overlying bowel gas.  Inpatient Medications:  Current Facility-Administered Medications:  .  acetaminophen (TYLENOL) tablet 650 mg, 650 mg, Oral, Q6H PRN, Manuella Ghazi, Pratik D, DO, 650 mg at 04/22/20 1851 .  apixaban (ELIQUIS) tablet 2.5 mg, 2.5 mg, Oral, BID, Adefeso, Oladapo, DO, 2.5 mg at 04/22/20 2226 .  carvedilol (COREG) tablet 3.125 mg, 3.125 mg, Oral, BID WC, Adefeso, Oladapo, DO, 3.125 mg at 04/22/20 1851 .  cefTRIAXone (ROCEPHIN) 1 g in sodium chloride 0.9 % 100 mL IVPB, 1 g, Intravenous, Q24H, Shah, Pratik D, DO, Last Rate: 200 mL/hr at 04/23/20 0055, 1 g at 04/23/20 0055 .  donepezil (ARICEPT) tablet 10 mg, 10 mg, Oral, QHS, Adefeso, Oladapo, DO, 10 mg at 04/22/20 2226 .  morphine 2 MG/ML injection 2 mg, 2 mg, Intravenous, Q4H PRN, Adefeso, Oladapo, DO .  ondansetron (ZOFRAN) tablet 4 mg, 4 mg, Oral, Q6H PRN **OR** ondansetron (ZOFRAN) injection 4 mg, 4 mg, Intravenous, Q6H PRN, Adefeso, Oladapo, DO .  pantoprazole (PROTONIX) EC tablet 40 mg, 40 mg, Oral, Daily, Adefeso, Oladapo, DO, 40 mg at 04/22/20 1054 .  verapamil (CALAN-SR) CR tablet 180 mg, 180 mg, Oral, Daily, Adefeso, Oladapo, DO, 180 mg at 04/22/20 1054   I/O    Intake/Output Summary (Last 24 hours) at 04/23/2020 1006 Last data filed at 04/23/2020 0300 Gross per 24 hour  Intake 539.78 ml  Output --  Net 539.78 ml     Physical Exam: Temp:  [98 F (36.7 C)-98.8 F (37.1 C)] 98.8 F (37.1 C) (07/31 0425) Pulse Rate:  [30-91] 75 (07/31  0425) Resp:  [15-23] 16 (07/31 0425) BP: (111-122)/(49-87) 115/82 (07/31 0425) SpO2:  [96 %-100 %] 100 % (07/31 0425)  Temp (24hrs), Avg:98.5 F (36.9 C), Min:98 F (36.7 C), Max:98.8 F (37.1 C) GENERAL: The patient is AO x3, in no acute distress. Elder and frail. HEENT: Head is normocephalic and atraumatic. EOMI are intact. Mouth - patient does not have teeth. NECK: Supple. No masses LUNGS: Clear to auscultation. No presence of rhonchi/wheezing/rales. Adequate chest expansion HEART: RRR, normal s1 and s2. ABDOMEN: Soft, nontender, no guarding, no peritoneal signs, and nondistended. BS +. No masses. EXTREMITIES: Without any cyanosis, clubbing, rash, lesions or edema. Has multiple bruises. NEUROLOGIC: AOx3, no focal motor deficit. SKIN: no jaundice, no rashes  Laboratory Data: CBC:     Component Value Date/Time   WBC 7.4 04/23/2020 0619   RBC 3.29 (L) 04/23/2020 0619   HGB 10.5 (L) 04/23/2020 0619   HGB 11.4 03/03/2018 1357   HCT 32.7 (L) 04/23/2020 0619   HCT 33.3 (L) 03/03/2018 1357   PLT 235 04/23/2020 0619   PLT 274 03/03/2018 1357   MCV 99.4 04/23/2020 0619   MCV 92 03/03/2018 1357   MCH 31.9 04/23/2020 0619   MCHC 32.1 04/23/2020 0619   RDW 13.0 04/23/2020 0619   RDW 14.9 03/03/2018 1357   LYMPHSABS 0.8 04/21/2020 2137   LYMPHSABS 1.2 03/03/2018 1357   MONOABS 0.9 04/21/2020 2137   EOSABS 0.2 04/21/2020 2137   EOSABS 0.2 03/03/2018  1357   BASOSABS 0.0 04/21/2020 2137   BASOSABS 0.0 03/03/2018 1357   COAG:  Lab Results  Component Value Date   INR 1.2 04/22/2020   INR 1.2 (H) 07/31/2016   INR 1.3 07/25/2016   PROTIME 39.7 (A) 05/30/2016    BMP:  BMP Latest Ref Rng & Units 04/23/2020 04/22/2020 04/21/2020  Glucose 70 - 99 mg/dL 91 152(H) 109(H)  BUN 8 - 23 mg/dL _0 Creatinine 0.44 - 1.00 mg/dL 0.68 0.69 0.82  BUN/Creat Ratio 12 - 28 - - -  Sodium 135 - 145 mmol/L 142 138 139  Potassium 3.5 - 5.1 mmol/L 3.1(L) 3.4(L) 3.5  Chloride 98 - 111 mmol/L 100  100 101  CO2 22 - 32 mmol/L 32 30 27  Calcium 8.9 - 10.3 mg/dL 8.8(L) 8.6(L) 8.4(L)    HEPATIC:  Hepatic Function Latest Ref Rng & Units 04/23/2020 04/22/2020 04/21/2020  Total Protein 6.5 - 8.1 g/dL 6.0(L) 6.7 6.5  Albumin 3.5 - 5.0 g/dL 2.9(L) 3.1(L) 3.1(L)  AST 15 - 41 U/L 24 30 34  ALT 0 - 44 U/L _1 Alk Phosphatase 38 - 126 U/L 145(H) 177(H) 180(H)  Total Bilirubin 0.3 - 1.2 mg/dL 0.7 0.5 0.6  Bilirubin, Direct - - - -    CARDIAC:  Lab Results  Component Value Date   CKTOTAL 172 05/23/2016   CKMB 2.3 06/22/2008   TROPONINI <0.03 03/24/2018      Imaging: I personally reviewed and interpreted the available labs, imaging and endoscopic files.   Assessment/Plan: 86 y.o. 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  The patient was initially given IV fluids with lactated ringer and she was given pain control with improvement of her symptomatology in the last 24 hours.  She has not presented any requirement of pain medications and has had an adequate urine output.  Her liver enzymes have remained stable in an adequate range and her BUN has not increased, without presence of hemoconcentration based on her most recent hemoglobin. BISAP score of 1, low mortality risk.  The patient had normal triglycerides, calcium level.  She has not been drinking alcohol and did not have any evidence of biliary stones.  Nevertheless, there was significant CBD dilation in her CT scan of the abdomen up to 16 mm, ?pancreatic body atrophy but no PD dilation.  This raises concern for possible underlying malignancy vs microlithiasis as etiology of her pancreatitis which will need to be further evaluated with a MRCP pancreas in 4 weeks once her  pancreatitis has resolved.  Importantly, discussion should be held with the family members as if she is found to have malignancy she is not a surgical candidate but decompression after assessing the risks given her age and comorbidities.  She is also hospice patient so this will need to be clarified.  Finally, another possibility for her pancreatitis will be related to her furosemide but this seems less likely as she has been taking the medication in the past. At this moment, it would be important to advance her diet to a low-fat diet as tolerated with use of antinausea medication if needed and pain control, but minimize the use of opiates if not required.   Recs - Advance diet to low fat  diet as tolerated - Decrease LR to 75 cc/h - No need to trend lipase - Zofran PRN for nausea/vomiting - Morphine 1 mg as needed for pain control - Consider switching Lasix to other diuretic - Trend LFTs tomorrow - Discussion with family regarding goals of care and interest in extensive vs conservative investigation and treatment of underlying cause of pancreatitis as described above  - If pursuing further workup, may require a MRCP pancreas protocol in 4 weeks  [name] Peppers, MD Gastroenterology and Hepatology [facility]  Note: Occasional unusual wording and randomly placed punctuation marks may result from the use of speech recognition technology to transcribe this document

## 2020-04-23 NOTE — Progress Notes (Signed)
PROGRESS NOTE    [name] Walker  XBJ:[id number] DOB: Mar 10, 1925 DOA: 04/21/2020 PCP: Claretta Fraise, MD   Brief Narrative:   Per HPI: [name] Walker a 86 y.o.femalewith medical history significantfor hypertension, COPD, DVT, GERD, ischemic colitis who presents to the emergency department due to abdominal pain that started yesterday. Pain was diffuse and severe in nature, she endorsed several episodes of emesis prior to arrival to the ED and also reported 3/4 large bowel movementswithin 24 hours prior to presenting to the emergency department. She denies any alleviating or aggravating factor and denies fever, chills, shortness of breath or chest pain.   -Patient has been admitted with pancreatitis that appears to be improving.  Plan to advance diet per GI recommendations and decrease IV fluid rate.  Patient does have UTI with E. coli and Enterococcus faecalis with sensitivities pending.  Continue Rocephin.  Patient is under hospice care and family members do not want to pursue any further aggressive evaluation of causes of pancreatitis.  Assessment & Plan:   Principal Problem:   Acute pancreatitis Active Problems:   Anemia   Essential hypertension   COPD (chronic obstructive pulmonary disease) (HCC)   GERD   Atrial fibrillation with RVR (HCC)   B12 deficiency   Chronic systolic (congestive) heart failure (HCC)   Abdominal pain   Nausea and vomiting   Diarrhea   Abdominal pain, nausea and vomiting secondary to acute pancreatitis Lipase level was 3168 CT abdomen pelvis showed changes of mild pancreatitis in the pancreatic head. Appreciate GI evaluation recommendations with fat-free diet initiated today and gentle IV fluid -She may have possible pancreatic cancer, but family does not want to pursue any further evaluation as she is under hospice care Continue IV Zofran p.r.n Continue IV morphine 2 mg every 4 hours as needed p.r.n for pain Continue Protonix Continue  IV LR at 83ml/Hr Advance to fat-free diet Anticipate discharge in a.m. if further improved  E. coli and Enterococcus faecalis UTI Continue on Rocephin Sensitivities pending  Hypokalemia Replete and reevaluate in a.m.  Acute diarrhea-improved Patient endorsed 3-4 bowel movements with pain 24 hours PTA to the ED, she has not had any bowel movement since arrival at the ED. Continue to monitor with plan for further work-up if patient continues to have diarrhea.  Macrocytic anemia with history of vitamin B12 deficiency H/H 11/34.1, MCV 100.9 It was noted that she has history of vitamin B-12 deficiency and is on vitamin B12 injection per med rec.  GERD Continue Protonix  COPD (not in acute exacerbation) Continue to monitor and treat accordingly  Chronic systolic CHF/Essential hypertension Continue home meds when med rec is updated  History of A. Fib CHA2DS2-VASc score is 4 points which is equal to 4.8% annual risk of stroke HAS-BLED score = 2 (moderate risk of major bleeding) Continue home Coreg, verapamil and Eliquis when med rec sedated    DVT prophylaxis: Eliquis Code Status: DNR Family Communication: Discussed with granddaughter in phone 7/31 Disposition Plan:   Status is: Inpatient  Remains inpatient appropriate because:IV treatments appropriate due to intensity of illness or inability to take PO and Inpatient level of care appropriate due to severity of illness   Dispo: The patient is from: SNF              Anticipated d/c is to: SNF              Anticipated d/c date is: 1 day  Patient currently is not medically stable to d/c.   Consultants:   GI  Procedures:   See below  Antimicrobials:  Anti-infectives (From admission, onward)   Start     Dose/Rate Route Frequency Ordered Stop   04/23/20 0000  cefTRIAXone (ROCEPHIN) 1 g in sodium chloride 0.9 % 100 mL IVPB     Discontinue     1 g 200 mL/hr over 30 Minutes Intravenous Every 24 hours  04/22/20 1706     04/22/20 0000  cefTRIAXone (ROCEPHIN) 1 g in sodium chloride 0.9 % 100 mL IVPB        1 g 200 mL/hr over 30 Minutes Intravenous  Once 04/21/20 2353 04/22/20 0057       Subjective: Patient seen and evaluated today with no new acute complaints or concerns. No acute concerns or events noted overnight.  She states that she is hungry and would like to eat.  Denies any abdominal pain, nausea, or vomiting.  Objective: Vitals:   04/22/20 2011 04/22/20 2020 04/23/20 0025 04/23/20 0425  BP:  116/84 (!) 118/86 115/82  Pulse:  72 74 75  Resp:  18 16 16   Temp:  98.4 F (36.9 C) 98.6 F (37 C) 98.8 F (37.1 C)  TempSrc:  Oral Oral Oral  SpO2: 100% 100% 100% 100%  Weight:      Height:        Intake/Output Summary (Last 24 hours) at 04/23/2020 1242 Last data filed at 04/23/2020 0300 Gross per 24 hour  Intake 539.78 ml  Output --  Net 539.78 ml   Filed Weights   04/21/20 2045  Weight: 72 kg    Examination:  General exam: Appears calm and comfortable  Respiratory system: Clear to auscultation. Respiratory effort normal.  Currently on 3 L nasal cannula oxygen Cardiovascular system: S1 & S2 heard, RRR.  Gastrointestinal system: Abdomen is soft Central nervous system: Alert and awake Extremities: No edema Skin: No rashes, lesions or ulcers Psychiatry: Flat affect    Data Reviewed: I have personally reviewed following labs and imaging studies  CBC: Recent Labs  Lab 04/21/20 2137 04/22/20 0526 04/23/20 0619  WBC 8.5 6.4 7.4  NEUTROABS 6.5  --   --   HGB 11.0* 11.3* 10.5*  HCT 34.1* 35.1* 32.7*  MCV 100.9* 100.6* 99.4  PLT 259 245 850   Basic Metabolic Panel: Recent Labs  Lab 04/21/20 2137 04/22/20 0526 04/23/20 0619  NA 139 138 142  K 3.5 3.4* 3.1*  CL 101 100 100  CO2 27 30 32  GLUCOSE 109* 152* 91  BUN 16 13 12   CREATININE 0.82 0.69 0.68  CALCIUM 8.4* 8.6* 8.8*  MG  --  1.7  --   PHOS  --  3.7  --    GFR: Estimated Creatinine Clearance:  39.7 mL/min (by C-G formula based on SCr of 0.68 mg/dL). Liver Function Tests: Recent Labs  Lab 04/21/20 2137 04/22/20 0526 04/23/20 0619  AST 34 30 24  ALT 21 20 16   ALKPHOS 180* 177* 145*  BILITOT 0.6 0.5 0.7  PROT 6.5 6.7 6.0*  ALBUMIN 3.1* 3.1* 2.9*   Recent Labs  Lab 04/21/20 2137 04/23/20 0619  LIPASE 3,168* 117*   No results for input(s): AMMONIA in the last 168 hours. Coagulation Profile: Recent Labs  Lab 04/22/20 0526  INR 1.2   Cardiac Enzymes: No results for input(s): CKTOTAL, CKMB, CKMBINDEX, TROPONINI in the last 168 hours. BNP (last 3 results) No results for input(s): PROBNP in the last 8760  hours. HbA1C: No results for input(s): HGBA1C in the last 72 hours. CBG: No results for input(s): GLUCAP in the last 168 hours. Lipid Profile: Recent Labs    04/22/20 0526  CHOL 156  HDL 62  LDLCALC 87  TRIG 36  CHOLHDL 2.5   Thyroid Function Tests: No results for input(s): TSH, T4TOTAL, FREET4, T3FREE, THYROIDAB in the last 72 hours. Anemia Panel: No results for input(s): VITAMINB12, FOLATE, FERRITIN, TIBC, IRON, RETICCTPCT in the last 72 hours. Sepsis Labs: Recent Labs  Lab 04/21/20 2137 04/21/20 2324  LATICACIDVEN 2.0* 1.8    Recent Results (from the past 240 hour(s))  Urine culture     Status: Abnormal (Preliminary result)   Collection Time: 04/21/20 10:39 PM   Specimen: Urine, Catheterized  Result Value Ref Range Status   Specimen Description   Final    URINE, CATHETERIZED Performed at [facility], [address], [place], [place] [address]    Special Requests   Final    NONE Performed at [facility], [address] [place], [place] [address]    Culture (A)  Final    >=100,000 COLONIES/mL ESCHERICHIA COLI 50,000 COLONIES/mL ENTEROCOCCUS FAECALIS    Report Status PENDING  Incomplete  SARS Coronavirus 2 by RT PCR (hospital order, performed in [facility] hospital lab) Nasopharyngeal Nasopharyngeal Swab     Status: None   Collection  Time: 04/22/20 12:55 AM   Specimen: Nasopharyngeal Swab  Result Value Ref Range Status   SARS Coronavirus 2 NEGATIVE NEGATIVE Final    Comment: (NOTE) SARS-CoV-2 target nucleic acids are NOT DETECTED.  The SARS-CoV-2 RNA is generally detectable in upper and lower respiratory specimens during the acute phase of infection. The lowest concentration of SARS-CoV-2 viral copies this assay can detect is 250 copies / mL. A negative result does not preclude SARS-CoV-2 infection and should not be used as the sole basis for treatment or other patient management decisions.  A negative result may occur with improper specimen collection / handling, submission of specimen other than nasopharyngeal swab, presence of viral mutation(s) within the areas targeted by this assay, and inadequate number of viral copies (<250 copies / mL). A negative result must be combined with clinical observations, patient history, and epidemiological information.  Fact Sheet for Patients:   StrictlyIdeas.no  Fact Sheet for Healthcare Providers: BankingDealers.co.za  This test is not yet approved or  cleared by the [place] FDA and has been authorized for detection and/or diagnosis of SARS-CoV-2 by FDA under an Emergency Use Authorization (EUA).  This EUA will remain in effect (meaning this test can be used) for the duration of the COVID-19 declaration under Section 564(b)(1) of the Act, 21 U.S.C. section 360bbb-3(b)(1), unless the authorization is terminated or revoked sooner.  Performed at [facility], [address] [place], [place] [address]          Radiology Studies: CT ABDOMEN PELVIS W CONTRAST  Result Date: 04/22/2020 CLINICAL DATA:  Epigastric pain for 1 day EXAM: CT ABDOMEN AND PELVIS WITH CONTRAST TECHNIQUE: Multidetector CT imaging of the abdomen and pelvis was performed using the standard protocol following bolus administration of intravenous  contrast. CONTRAST:  137mL OMNIPAQUE IOHEXOL 300 MG/ML  SOLN COMPARISON:  11/28/2007, plain film from previous day. FINDINGS: Lower chest: No acute abnormality. Hepatobiliary: No focal liver abnormality is seen. Status post cholecystectomy. Dilatation of the common bile duct is noted consistent with the post cholecystectomy state. Pancreas: Pancreas demonstrates some peripancreatic inflammatory change in the region of the pancreatic head  consistent with focal pancreatitis. This is new from the prior study. Spleen: Normal in size without focal abnormality. Adrenals/Urinary Tract: Renal calculi are noted bilaterally without obstructive change. Right-sided renal cyst is seen. No obstructive changes are noted. The bladder is partially distended. Air is noted bladder recent instrumentation. Stomach/Bowel: Colon shows no obstructive or inflammatory changes. The appendix is not well visualized although no inflammatory changes to suggest appendicitis are seen. Stomach and small bowel appear within normal limits. Vascular/Lymphatic: Aortic atherosclerosis. No enlarged abdominal or pelvic lymph nodes. Reproductive: Status post hysterectomy. No adnexal masses. Other: No abdominal wall hernia or abnormality. No abdominopelvic ascites. Musculoskeletal: Bilateral hip replacements are noted. Degenerative changes of lumbar spine are seen. IMPRESSION: Changes of mild pancreatitis in the pancreatic head. Bilateral renal calculi without obstructive change. Electronically Signed   By: Inez Catalina M.D.   On: 04/22/2020 02:24   DG ABD ACUTE 2+V W 1V CHEST  Result Date: 04/21/2020 CLINICAL DATA:  Abdominal pain and nausea. EXAM: DG ABDOMEN ACUTE W/ 1V CHEST COMPARISON:  None. FINDINGS: Lung volumes are low. Mild cardiomegaly. Aortic atherosclerosis. Interstitial coarsening which is likely chronic. No focal airspace disease or pleural fluid. Air-filled bowel in the upper abdomen likely represent significant gaseous distention of  stomach. No other bowel dilatation. Air in stool throughout the colon in a nonobstructive pattern. There is no evidence of free air. IVC filter in place. There are vascular calcifications. Surgical clips in the right upper quadrant typical of cholecystectomy. Advanced degenerative change in the shoulders. Bilateral hip arthroplasties. Scoliotic curvature in the spine. IMPRESSION: 1. Gaseous distention of stomach. No other bowel dilatation to suggest obstruction. 2. Low lung volumes with mild cardiomegaly and chronic interstitial coarsening. Electronically Signed   By: Keith Rake M.D.   On: 04/21/2020 22:13   US Abdomen Limited RUQ  Result Date: 04/22/2020 CLINICAL DATA:  Abdominal pain.  Elevated LFTs EXAM: ULTRASOUND ABDOMEN LIMITED RIGHT UPPER QUADRANT COMPARISON:  CT today FINDINGS: Gallbladder: Prior cholecystectomy Common bile duct: Diameter: Difficult to visualize due to overlying bowel gas, dilated on CT. See CT report. Liver: No focal lesion identified. Within normal limits in parenchymal echogenicity. Portal vein is patent on color Doppler imaging with normal direction of blood flow towards the liver. Other: None IMPRESSION: Limited study due to overlying bowel gas. Prior cholecystectomy. See CT report from today for further evaluation Electronically Signed   By: Rolm Baptise M.D.   On: 04/22/2020 11:18        Scheduled Meds: . apixaban  2.5 mg Oral BID  . carvedilol  3.125 mg Oral BID WC  . donepezil  10 mg Oral QHS  . pantoprazole  40 mg Oral Daily  . potassium chloride  40 mEq Oral BID  . verapamil  180 mg Oral Daily   Continuous Infusions: . cefTRIAXone (ROCEPHIN)  IV 1 g (04/23/20 0055)  . lactated ringers 75 mL/hr at 04/23/20 1226     LOS: 1 day    Time spent: 30 minutes    Desere Gwin Darleen Crocker, DO [facility]  If 7PM-7AM, please contact night-coverage [email or web address] 04/23/2020, 12:42 PM

## 2020-04-23 NOTE — TOC Transition Note (Addendum)
Transition of Care [facility]) - CM/SW Discharge Note   Patient Details  Name: [name] Walker MRN: [id number] Date of Birth: 1925/04/05  Transition of Care [facility]) CM/SW Contact:  Natasha Bence, LCSW Phone Number: 04/23/2020, 4:28 PM   Clinical Narrative:    CSW contacted [facility] to confirm if they were able to receive returning patient on 04/23/2020. [facility] is agreeable to take patient. CSW provided [facility] with patient's discharge summary. No response from family contact when called to notify them of patient's discharge. CSW has completed med transport form and contacted [facility] for transport. Nurse agreeable to call report. TOC signing off.    Final next level of care: Assisted Living Barriers to Discharge: Barriers Resolved   Patient Goals and CMS Choice Patient states their goals for this hospitalization and ongoing recovery are:: Return to ALF   Choice offered to / list presented to : Patient  Discharge Placement                Patient to be transferred to facility by: [facility] Name of family member notified: Letta Kocher Patient and family notified of of transfer: 04/23/20  Discharge Plan and Services                                     Social Determinants of Health (SDOH) Interventions     Readmission Risk Interventions No flowsheet data found.

## 2020-04-23 NOTE — Discharge Summary (Signed)
Physician Discharge Summary  [name] Walker:[id number] DOB: 03/23/1925 DOA: 04/21/2020  PCP: Claretta Fraise, MD  Admit date: 04/21/2020  Discharge date: 04/23/2020  Admitted From:SNF  Disposition:  SNF  Recommendations for Outpatient Follow-up:  1. Follow up with PCP in 1-2 weeks 2. Please obtain BMP/CBC in one week, monitor potassium levels closely. 3. Continue on medications as prior 4. Continue on cefdinir as prescribed for 3 more days for treatment of UTI and follow-up urine culture sensitivity  Home Health: None  Equipment/Devices: None  Discharge Condition: Stable  CODE STATUS: DNR  Diet recommendation: Heart Healthy/fat-free  Brief/Interim Summary: Per HPI: [name] Walker a 86 y.o.femalewith medical history significantfor hypertension, COPD, DVT, GERD, ischemic colitis who presents to the emergency department due to abdominal pain that started yesterday. Pain was diffuse and severe in nature, she endorsed several episodes of emesis prior to arrival to the ED and also reported 3/4 large bowel movementswithin 24 hours prior to presenting to the emergency department. She denies any alleviating or aggravating factor and denies fever, chills, shortness of breath or chest pain.   -Patient has been admitted with pancreatitis she is doing much better this morning and is tolerating diet.  Plan was originally to keep patient until tomorrow to see if there were any further issues with abdominal pain, nausea, or vomiting, however patient has become quite agitated and would like to leave the hospital if possible.  I have discussed the case with GI and they are in agreement that this would be a sound decision.  Family members and patient do not want to pursue any further aggressive evaluation and/or treatment thereafter.  It is currently felt that her pancreatitis may be a result of possible development of pancreatic cancer.  She is currently under hospice care.  She has  been treated with Rocephin for urinary tract infection while here and is noted to have culture growth of E. coli as well as Enterococcus faecalis, but sensitivities are pending.  She will be discharged on cefdinir for 3 more days to complete course of treatment.  Discharge Diagnoses:  Principal Problem:   Acute pancreatitis Active Problems:   Anemia   Essential hypertension   COPD (chronic obstructive pulmonary disease) (HCC)   GERD   Atrial fibrillation with RVR (HCC)   B12 deficiency   Chronic systolic (congestive) heart failure (HCC)   Abdominal pain   Nausea and vomiting   Diarrhea  Principal discharge diagnosis: Acute pancreatitis-resolved.  UTI with E. coli and Enterococcus faecalis.  Discharge Instructions   Allergies as of 04/23/2020      Reactions   Diltiazem Anxiety, Other (See Comments)   Sleep disturbance   Iohexol     Desc: THROAT SWELLING-NOTED IN CHART AFTER IVC PLACEMENT-ARS 12-02-05   Latex Other (See Comments)   unknown   Penicillins Swelling   Patient was given zosyn this admission and tolerated. After d/w MD and pt, she had swelling in arm only, so possible just infiltration and not allergic. ABx changed to Cefazolin and is tolerating   Shellfish Allergy Other (See Comments)   Reaction unknown      Medication List    TAKE these medications   amitriptyline 150 MG tablet Commonly known as: ELAVIL TAKE (1) TABLET BY MOUTH AT BEDTIME. What changed: See the new instructions.   Calmoseptine 0.44-20.6 % Oint Generic drug: Menthol-Zinc Oxide Apply to buttocks TID What changed:   how much to take  how to take this  when to take this  reasons to take this   carvedilol 3.125 MG tablet Commonly known as: COREG TAKE (1) TABLET BY MOUTH TWICE DAILY. What changed: See the new instructions.   cefdinir 300 MG capsule Commonly known as: OMNICEF Take 1 capsule (300 mg total) by mouth 2 (two) times daily for 3 days.   cetirizine 10 MG tablet Commonly  known as: ZYRTEC Take 10 mg by mouth daily.   donepezil 10 MG tablet Commonly known as: ARICEPT TAKE ONE TABLET AT BEDTIME   Eliquis 2.5 MG Tabs tablet Generic drug: apixaban Take 2.5 mg by mouth 2 (two) times daily.   eucerin lotion Apply topically every 12 (twelve) hours as needed for dry skin.   furosemide 40 MG tablet Commonly known as: LASIX TAKE (2) TABLETS DAILY What changed: See the new instructions.   gabapentin 300 MG capsule Commonly known as: NEURONTIN Take 1 capsule (300 mg total) by mouth 2 (two) times daily. What changed: See the new instructions.   LORazepam 0.5 MG tablet Commonly known as: ATIVAN Take 1 tablet (0.5 mg total) by mouth at bedtime. Prn sleep, nerves What changed: additional instructions   omeprazole 20 MG capsule Commonly known as: PRILOSEC TAKE 2 CAPSULES DAILY AS NEEDED FOR REFLUX What changed: See the new instructions.   OXYGEN Inhale 2 L/min into the lungs continuous.   potassium chloride 10 MEQ tablet Commonly known as: KLOR-CON TAKE 2 TABLETS BY MOUTH DAILY.   senna 8.6 MG tablet Commonly known as: SENOKOT Take 1 tablet by mouth in the morning and at bedtime.   verapamil 180 MG CR tablet Commonly known as: CALAN-SR Take 1 tablet (180 mg total) by mouth at bedtime. What changed:   how much to take  how to take this  when to take this  additional instructions       Follow-up Information    Claretta Fraise, MD Follow up in 1 week(s).   Specialty: Family Medicine Contact information: [place] [address] [phone]              Allergies  Allergen Reactions   Diltiazem Anxiety and Other (See Comments)    Sleep disturbance   Iohexol      Desc: THROAT SWELLING-NOTED IN CHART AFTER IVC PLACEMENT-ARS 12-02-05    Latex Other (See Comments)    unknown   Penicillins Swelling    Patient was given zosyn this admission and tolerated. After d/w MD and pt, she had swelling in arm only, so  possible just infiltration and not allergic. ABx changed to Cefazolin and is tolerating   Shellfish Allergy Other (See Comments)    Reaction unknown    Consultations:  GI   Procedures/Studies: CT ABDOMEN PELVIS W CONTRAST  Result Date: 04/22/2020 CLINICAL DATA:  Epigastric pain for 1 day EXAM: CT ABDOMEN AND PELVIS WITH CONTRAST TECHNIQUE: Multidetector CT imaging of the abdomen and pelvis was performed using the standard protocol following bolus administration of intravenous contrast. CONTRAST:  148mL OMNIPAQUE IOHEXOL 300 MG/ML  SOLN COMPARISON:  11/28/2007, plain film from previous day. FINDINGS: Lower chest: No acute abnormality. Hepatobiliary: No focal liver abnormality is seen. Status post cholecystectomy. Dilatation of the common bile duct is noted consistent with the post cholecystectomy state. Pancreas: Pancreas demonstrates some peripancreatic inflammatory change in the region of the pancreatic head consistent with focal pancreatitis. This is new from the prior study. Spleen: Normal in size without focal abnormality. Adrenals/Urinary Tract: Renal calculi are noted bilaterally without obstructive change. Right-sided renal cyst is seen.  No obstructive changes are noted. The bladder is partially distended. Air is noted bladder recent instrumentation. Stomach/Bowel: Colon shows no obstructive or inflammatory changes. The appendix is not well visualized although no inflammatory changes to suggest appendicitis are seen. Stomach and small bowel appear within normal limits. Vascular/Lymphatic: Aortic atherosclerosis. No enlarged abdominal or pelvic lymph nodes. Reproductive: Status post hysterectomy. No adnexal masses. Other: No abdominal wall hernia or abnormality. No abdominopelvic ascites. Musculoskeletal: Bilateral hip replacements are noted. Degenerative changes of lumbar spine are seen. IMPRESSION: Changes of mild pancreatitis in the pancreatic head. Bilateral renal calculi without obstructive  change. Electronically Signed   By: Inez Catalina M.D.   On: 04/22/2020 02:24   DG ABD ACUTE 2+V W 1V CHEST  Result Date: 04/21/2020 CLINICAL DATA:  Abdominal pain and nausea. EXAM: DG ABDOMEN ACUTE W/ 1V CHEST COMPARISON:  None. FINDINGS: Lung volumes are low. Mild cardiomegaly. Aortic atherosclerosis. Interstitial coarsening which is likely chronic. No focal airspace disease or pleural fluid. Air-filled bowel in the upper abdomen likely represent significant gaseous distention of stomach. No other bowel dilatation. Air in stool throughout the colon in a nonobstructive pattern. There is no evidence of free air. IVC filter in place. There are vascular calcifications. Surgical clips in the right upper quadrant typical of cholecystectomy. Advanced degenerative change in the shoulders. Bilateral hip arthroplasties. Scoliotic curvature in the spine. IMPRESSION: 1. Gaseous distention of stomach. No other bowel dilatation to suggest obstruction. 2. Low lung volumes with mild cardiomegaly and chronic interstitial coarsening. Electronically Signed   By: Keith Rake M.D.   On: 04/21/2020 22:13   US Abdomen Limited RUQ  Result Date: 04/22/2020 CLINICAL DATA:  Abdominal pain.  Elevated LFTs EXAM: ULTRASOUND ABDOMEN LIMITED RIGHT UPPER QUADRANT COMPARISON:  CT today FINDINGS: Gallbladder: Prior cholecystectomy Common bile duct: Diameter: Difficult to visualize due to overlying bowel gas, dilated on CT. See CT report. Liver: No focal lesion identified. Within normal limits in parenchymal echogenicity. Portal vein is patent on color Doppler imaging with normal direction of blood flow towards the liver. Other: None IMPRESSION: Limited study due to overlying bowel gas. Prior cholecystectomy. See CT report from today for further evaluation Electronically Signed   By: Rolm Baptise M.D.   On: 04/22/2020 11:18     Discharge Exam: Vitals:   04/23/20 0425 04/23/20 1454  BP: 115/82 101/66  Pulse: 75 76  Resp: 16 16   Temp: 98.8 F (37.1 C) 98.8 F (37.1 C)  SpO2: 100% 100%   Vitals:   04/22/20 2020 04/23/20 0025 04/23/20 0425 04/23/20 1454  BP: 116/84 (!) 118/86 115/82 101/66  Pulse: 72 74 75 76  Resp: 18 16 16 16   Temp: 98.4 F (36.9 C) 98.6 F (37 C) 98.8 F (37.1 C) 98.8 F (37.1 C)  TempSrc: Oral Oral Oral Oral  SpO2: 100% 100% 100% 100%  Weight:      Height:        General: Pt is alert, awake, not in acute distress Cardiovascular: RRR, S1/S2 +, no rubs, no gallops Respiratory: CTA bilaterally, no wheezing, no rhonchi, currently on 3 L nasal cannula oxygen Abdominal: Soft, NT, ND, bowel sounds + Extremities: no edema, no cyanosis    The results of significant diagnostics from this hospitalization (including imaging, microbiology, ancillary and laboratory) are listed below for reference.     Microbiology: Recent Results (from the past 240 hour(s))  Urine culture     Status: Abnormal (Preliminary result)   Collection Time: 04/21/20 10:39 PM  Specimen: Urine, Catheterized  Result Value Ref Range Status   Specimen Description   Final    URINE, CATHETERIZED Performed at [facility], [address] [place], [place] [address]    Special Requests   Final    NONE Performed at [facility], [address] [place], [place] [address]    Culture (A)  Final    >=100,000 COLONIES/mL ESCHERICHIA COLI 50,000 COLONIES/mL ENTEROCOCCUS FAECALIS    Report Status PENDING  Incomplete  SARS Coronavirus 2 by RT PCR (hospital order, performed in [place] hospital lab) Nasopharyngeal Nasopharyngeal Swab     Status: None   Collection Time: 04/22/20 12:55 AM   Specimen: Nasopharyngeal Swab  Result Value Ref Range Status   SARS Coronavirus 2 NEGATIVE NEGATIVE Final    Comment: (NOTE) SARS-CoV-2 target nucleic acids are NOT DETECTED.  The SARS-CoV-2 RNA is generally detectable in upper and lower respiratory specimens during the acute phase of infection. The lowest concentration of  SARS-CoV-2 viral copies this assay can detect is 250 copies / mL. A negative result does not preclude SARS-CoV-2 infection and should not be used as the sole basis for treatment or other patient management decisions.  A negative result may occur with improper specimen collection / handling, submission of specimen other than nasopharyngeal swab, presence of viral mutation(s) within the areas targeted by this assay, and inadequate number of viral copies (<250 copies / mL). A negative result must be combined with clinical observations, patient history, and epidemiological information.  Fact Sheet for Patients:   StrictlyIdeas.no  Fact Sheet for Healthcare Providers: BankingDealers.co.za  This test is not yet approved or  cleared by the [place] FDA and has been authorized for detection and/or diagnosis of SARS-CoV-2 by FDA under an Emergency Use Authorization (EUA).  This EUA will remain in effect (meaning this test can be used) for the duration of the COVID-19 declaration under Section 564(b)(1) of the Act, 21 U.S.C. section 360bbb-3(b)(1), unless the authorization is terminated or revoked sooner.  Performed at [facility], [address] [place], [place] [address]      Labs: BNP (last 3 results) No results for input(s): BNP in the last 8760 hours. Basic Metabolic Panel: Recent Labs  Lab 04/21/20 2137 04/22/20 0526 04/23/20 0619  NA 139 138 142  K 3.5 3.4* 3.1*  CL 101 100 100  CO2 27 30 32  GLUCOSE 109* 152* 91  BUN 16 13 12   CREATININE 0.82 0.69 0.68  CALCIUM 8.4* 8.6* 8.8*  MG  --  1.7  --   PHOS  --  3.7  --    Liver Function Tests: Recent Labs  Lab 04/21/20 2137 04/22/20 0526 04/23/20 0619  AST 34 30 24  ALT 21 20 16   ALKPHOS 180* 177* 145*  BILITOT 0.6 0.5 0.7  PROT 6.5 6.7 6.0*  ALBUMIN 3.1* 3.1* 2.9*   Recent Labs  Lab 04/21/20 2137 04/23/20 0619  LIPASE 3,168* 117*   No results for input(s):  AMMONIA in the last 168 hours. CBC: Recent Labs  Lab 04/21/20 2137 04/22/20 0526 04/23/20 0619  WBC 8.5 6.4 7.4  NEUTROABS 6.5  --   --   HGB 11.0* 11.3* 10.5*  HCT 34.1* 35.1* 32.7*  MCV 100.9* 100.6* 99.4  PLT 259 245 235   Cardiac Enzymes: No results for input(s): CKTOTAL, CKMB, CKMBINDEX, TROPONINI in the last 168 hours. BNP: Invalid input(s): POCBNP CBG: No results for input(s): GLUCAP in the last 168 hours. D-Dimer No results for input(s):  DDIMER in the last 72 hours. Hgb A1c No results for input(s): HGBA1C in the last 72 hours. Lipid Profile Recent Labs    04/22/20 0526  CHOL 156  HDL 62  LDLCALC 87  TRIG 36  CHOLHDL 2.5   Thyroid function studies No results for input(s): TSH, T4TOTAL, T3FREE, THYROIDAB in the last 72 hours.  Invalid input(s): FREET3 Anemia work up No results for input(s): VITAMINB12, FOLATE, FERRITIN, TIBC, IRON, RETICCTPCT in the last 72 hours. Urinalysis    Component Value Date/Time   COLORURINE YELLOW 04/21/2020 2119   APPEARANCEUR TURBID (A) 04/21/2020 2119   LABSPEC 1.011 04/21/2020 2119   PHURINE 6.0 04/21/2020 2119   GLUCOSEU NEGATIVE 04/21/2020 2119   HGBUR LARGE (A) 04/21/2020 2119   BILIRUBINUR NEGATIVE 04/21/2020 2119   [place] NEGATIVE 04/21/2020 2119   PROTEINUR 100 (A) 04/21/2020 2119   UROBILINOGEN 0.2 01/19/2013 0040   NITRITE NEGATIVE 04/21/2020 2119   LEUKOCYTESUR MODERATE (A) 04/21/2020 2119   Sepsis Labs Invalid input(s): PROCALCITONIN,  WBC,  LACTICIDVEN Microbiology Recent Results (from the past 240 hour(s))  Urine culture     Status: Abnormal (Preliminary result)   Collection Time: 04/21/20 10:39 PM   Specimen: Urine, Catheterized  Result Value Ref Range Status   Specimen Description   Final    URINE, CATHETERIZED Performed at [facility], [address] [place], [place] [address]    Special Requests   Final    NONE Performed at [facility], [address], [place], [place] [address]    Culture  (A)  Final    >=100,000 COLONIES/mL ESCHERICHIA COLI 50,000 COLONIES/mL ENTEROCOCCUS FAECALIS    Report Status PENDING  Incomplete  SARS Coronavirus 2 by RT PCR (hospital order, performed in [place] hospital lab) Nasopharyngeal Nasopharyngeal Swab     Status: None   Collection Time: 04/22/20 12:55 AM   Specimen: Nasopharyngeal Swab  Result Value Ref Range Status   SARS Coronavirus 2 NEGATIVE NEGATIVE Final    Comment: (NOTE) SARS-CoV-2 target nucleic acids are NOT DETECTED.  The SARS-CoV-2 RNA is generally detectable in upper and lower respiratory specimens during the acute phase of infection. The lowest concentration of SARS-CoV-2 viral copies this assay can detect is 250 copies / mL. A negative result does not preclude SARS-CoV-2 infection and should not be used as the sole basis for treatment or other patient management decisions.  A negative result may occur with improper specimen collection / handling, submission of specimen other than nasopharyngeal swab, presence of viral mutation(s) within the areas targeted by this assay, and inadequate number of viral copies (<250 copies / mL). A negative result must be combined with clinical observations, patient history, and epidemiological information.  Fact Sheet for Patients:   StrictlyIdeas.no  Fact Sheet for Healthcare Providers: BankingDealers.co.za  This test is not yet approved or  cleared by the [place] FDA and has been authorized for detection and/or diagnosis of SARS-CoV-2 by FDA under an Emergency Use Authorization (EUA).  This EUA will remain in effect (meaning this test can be used) for the duration of the COVID-19 declaration under Section 564(b)(1) of the Act, 21 U.S.C. section 360bbb-3(b)(1), unless the authorization is terminated or revoked sooner.  Performed at [facility], [address] [place], [place] [address]      Time coordinating discharge: 35  minutes  SIGNED:   Rodena Goldmann, DO [facility] 04/23/2020, 3:19 PM  If 7PM-7AM, please contact night-coverage [email or web address]

## 2020-04-25 LAB — URINE CULTURE: Culture: >=100000 — AB

## 2020-06-10 ENCOUNTER — Other Ambulatory Visit: Payer: Self-pay

## 2020-06-10 ENCOUNTER — Ambulatory Visit (INDEPENDENT_AMBULATORY_CARE_PROVIDER_SITE_OTHER): Payer: Medicare Other

## 2020-06-10 DIAGNOSIS — I110 Hypertensive heart disease with heart failure: Secondary | ICD-10-CM | POA: Diagnosis not present

## 2020-06-10 DIAGNOSIS — M6281 Muscle weakness (generalized): Secondary | ICD-10-CM | POA: Diagnosis not present

## 2020-06-10 DIAGNOSIS — Z9981 Dependence on supplemental oxygen: Secondary | ICD-10-CM

## 2020-06-10 DIAGNOSIS — I4891 Unspecified atrial fibrillation: Secondary | ICD-10-CM

## 2020-06-10 DIAGNOSIS — I509 Heart failure, unspecified: Secondary | ICD-10-CM | POA: Diagnosis not present

## 2020-06-10 DIAGNOSIS — Z7409 Other reduced mobility: Secondary | ICD-10-CM

## 2020-06-10 DIAGNOSIS — R1310 Dysphagia, unspecified: Secondary | ICD-10-CM

## 2020-06-10 DIAGNOSIS — Z9181 History of falling: Secondary | ICD-10-CM

## 2020-06-10 DIAGNOSIS — J449 Chronic obstructive pulmonary disease, unspecified: Secondary | ICD-10-CM

## 2020-06-10 DIAGNOSIS — Z7901 Long term (current) use of anticoagulants: Secondary | ICD-10-CM

## 2020-07-19 DIAGNOSIS — R531 Weakness: Secondary | ICD-10-CM | POA: Diagnosis not present

## 2020-07-19 DIAGNOSIS — G8929 Other chronic pain: Secondary | ICD-10-CM | POA: Diagnosis not present

## 2020-07-19 DIAGNOSIS — R52 Pain, unspecified: Secondary | ICD-10-CM | POA: Diagnosis not present

## 2020-08-28 DIAGNOSIS — R0989 Other specified symptoms and signs involving the circulatory and respiratory systems: Secondary | ICD-10-CM | POA: Diagnosis not present

## 2020-08-31 DIAGNOSIS — K5900 Constipation, unspecified: Secondary | ICD-10-CM | POA: Diagnosis not present

## 2020-08-31 DIAGNOSIS — R059 Cough, unspecified: Secondary | ICD-10-CM | POA: Diagnosis not present

## 2020-08-31 DIAGNOSIS — G8929 Other chronic pain: Secondary | ICD-10-CM | POA: Diagnosis not present

## 2020-08-31 DIAGNOSIS — J449 Chronic obstructive pulmonary disease, unspecified: Secondary | ICD-10-CM | POA: Diagnosis not present

## 2020-10-18 DIAGNOSIS — I10 Essential (primary) hypertension: Secondary | ICD-10-CM | POA: Diagnosis not present

## 2020-10-18 DIAGNOSIS — I4891 Unspecified atrial fibrillation: Secondary | ICD-10-CM | POA: Diagnosis not present

## 2020-10-18 DIAGNOSIS — R52 Pain, unspecified: Secondary | ICD-10-CM | POA: Diagnosis not present

## 2020-10-18 DIAGNOSIS — I509 Heart failure, unspecified: Secondary | ICD-10-CM | POA: Diagnosis not present

## 2020-11-16 DIAGNOSIS — I5020 Unspecified systolic (congestive) heart failure: Secondary | ICD-10-CM | POA: Diagnosis not present

## 2020-11-16 DIAGNOSIS — J449 Chronic obstructive pulmonary disease, unspecified: Secondary | ICD-10-CM | POA: Diagnosis not present

## 2020-11-16 DIAGNOSIS — R531 Weakness: Secondary | ICD-10-CM | POA: Diagnosis not present

## 2020-11-16 DIAGNOSIS — G8929 Other chronic pain: Secondary | ICD-10-CM | POA: Diagnosis not present

## 2020-11-22 DIAGNOSIS — I10 Essential (primary) hypertension: Secondary | ICD-10-CM | POA: Diagnosis not present

## 2020-11-22 DIAGNOSIS — I509 Heart failure, unspecified: Secondary | ICD-10-CM | POA: Diagnosis not present

## 2020-11-22 DIAGNOSIS — R52 Pain, unspecified: Secondary | ICD-10-CM | POA: Diagnosis not present

## 2020-11-22 DIAGNOSIS — I4891 Unspecified atrial fibrillation: Secondary | ICD-10-CM | POA: Diagnosis not present

## 2020-12-27 DIAGNOSIS — I509 Heart failure, unspecified: Secondary | ICD-10-CM | POA: Diagnosis not present

## 2020-12-27 DIAGNOSIS — I4891 Unspecified atrial fibrillation: Secondary | ICD-10-CM | POA: Diagnosis not present

## 2020-12-27 DIAGNOSIS — N390 Urinary tract infection, site not specified: Secondary | ICD-10-CM | POA: Diagnosis not present

## 2020-12-27 DIAGNOSIS — I10 Essential (primary) hypertension: Secondary | ICD-10-CM | POA: Diagnosis not present

## 2021-02-01 DIAGNOSIS — J449 Chronic obstructive pulmonary disease, unspecified: Secondary | ICD-10-CM | POA: Diagnosis not present

## 2021-02-01 DIAGNOSIS — I4891 Unspecified atrial fibrillation: Secondary | ICD-10-CM | POA: Diagnosis not present

## 2021-03-02 DIAGNOSIS — I4891 Unspecified atrial fibrillation: Secondary | ICD-10-CM | POA: Diagnosis not present

## 2021-03-02 DIAGNOSIS — J449 Chronic obstructive pulmonary disease, unspecified: Secondary | ICD-10-CM | POA: Diagnosis not present

## 2021-04-05 DIAGNOSIS — I4891 Unspecified atrial fibrillation: Secondary | ICD-10-CM | POA: Diagnosis not present

## 2021-04-05 DIAGNOSIS — J449 Chronic obstructive pulmonary disease, unspecified: Secondary | ICD-10-CM | POA: Diagnosis not present

## 2021-04-20 DIAGNOSIS — Z515 Encounter for palliative care: Secondary | ICD-10-CM | POA: Diagnosis not present

## 2021-05-02 DIAGNOSIS — I4891 Unspecified atrial fibrillation: Secondary | ICD-10-CM | POA: Diagnosis not present

## 2021-05-02 DIAGNOSIS — I509 Heart failure, unspecified: Secondary | ICD-10-CM | POA: Diagnosis not present

## 2021-05-02 DIAGNOSIS — I10 Essential (primary) hypertension: Secondary | ICD-10-CM | POA: Diagnosis not present

## 2021-05-17 DIAGNOSIS — Z515 Encounter for palliative care: Secondary | ICD-10-CM | POA: Diagnosis not present

## 2021-05-17 DIAGNOSIS — K5900 Constipation, unspecified: Secondary | ICD-10-CM | POA: Diagnosis not present

## 2021-05-23 DIAGNOSIS — Z515 Encounter for palliative care: Secondary | ICD-10-CM | POA: Diagnosis not present

## 2021-06-13 DIAGNOSIS — Z79899 Other long term (current) drug therapy: Secondary | ICD-10-CM | POA: Diagnosis not present

## 2021-06-13 DIAGNOSIS — R531 Weakness: Secondary | ICD-10-CM | POA: Diagnosis not present

## 2021-06-13 DIAGNOSIS — Z515 Encounter for palliative care: Secondary | ICD-10-CM | POA: Diagnosis not present

## 2021-06-13 DIAGNOSIS — K5900 Constipation, unspecified: Secondary | ICD-10-CM | POA: Diagnosis not present

## 2021-07-10 DIAGNOSIS — F03918 Unspecified dementia, unspecified severity, with other behavioral disturbance: Secondary | ICD-10-CM | POA: Diagnosis not present

## 2021-07-10 DIAGNOSIS — K5900 Constipation, unspecified: Secondary | ICD-10-CM | POA: Diagnosis not present

## 2021-07-10 DIAGNOSIS — Z79899 Other long term (current) drug therapy: Secondary | ICD-10-CM | POA: Diagnosis not present

## 2021-07-10 DIAGNOSIS — I10 Essential (primary) hypertension: Secondary | ICD-10-CM | POA: Diagnosis not present

## 2021-07-31 DIAGNOSIS — Z79899 Other long term (current) drug therapy: Secondary | ICD-10-CM | POA: Diagnosis not present

## 2021-07-31 DIAGNOSIS — R531 Weakness: Secondary | ICD-10-CM | POA: Diagnosis not present

## 2021-07-31 DIAGNOSIS — F03918 Unspecified dementia, unspecified severity, with other behavioral disturbance: Secondary | ICD-10-CM | POA: Diagnosis not present

## 2021-08-30 DIAGNOSIS — I509 Heart failure, unspecified: Secondary | ICD-10-CM | POA: Diagnosis not present

## 2021-08-30 DIAGNOSIS — Z515 Encounter for palliative care: Secondary | ICD-10-CM | POA: Diagnosis not present

## 2021-08-30 DIAGNOSIS — J449 Chronic obstructive pulmonary disease, unspecified: Secondary | ICD-10-CM | POA: Diagnosis not present

## 2021-09-18 DIAGNOSIS — M791 Myalgia, unspecified site: Secondary | ICD-10-CM | POA: Diagnosis not present

## 2021-09-18 DIAGNOSIS — Z79899 Other long term (current) drug therapy: Secondary | ICD-10-CM | POA: Diagnosis not present

## 2021-09-25 DIAGNOSIS — Z79899 Other long term (current) drug therapy: Secondary | ICD-10-CM | POA: Diagnosis not present

## 2021-09-25 DIAGNOSIS — H109 Unspecified conjunctivitis: Secondary | ICD-10-CM | POA: Diagnosis not present

## 2021-10-24 DIAGNOSIS — Z79899 Other long term (current) drug therapy: Secondary | ICD-10-CM | POA: Diagnosis not present

## 2021-10-24 DIAGNOSIS — R451 Restlessness and agitation: Secondary | ICD-10-CM | POA: Diagnosis not present

## 2021-11-02 DIAGNOSIS — R450 Nervousness: Secondary | ICD-10-CM | POA: Diagnosis not present

## 2021-11-02 DIAGNOSIS — R451 Restlessness and agitation: Secondary | ICD-10-CM | POA: Diagnosis not present

## 2021-11-02 DIAGNOSIS — Z79899 Other long term (current) drug therapy: Secondary | ICD-10-CM | POA: Diagnosis not present

## 2021-11-02 DIAGNOSIS — R531 Weakness: Secondary | ICD-10-CM | POA: Diagnosis not present

## 2021-11-29 DIAGNOSIS — Z515 Encounter for palliative care: Secondary | ICD-10-CM | POA: Diagnosis not present

## 2021-11-29 DIAGNOSIS — I5020 Unspecified systolic (congestive) heart failure: Secondary | ICD-10-CM | POA: Diagnosis not present

## 2021-11-29 DIAGNOSIS — R451 Restlessness and agitation: Secondary | ICD-10-CM | POA: Diagnosis not present

## 2021-12-28 DIAGNOSIS — H5213 Myopia, bilateral: Secondary | ICD-10-CM | POA: Diagnosis not present

## 2022-01-08 DIAGNOSIS — R451 Restlessness and agitation: Secondary | ICD-10-CM | POA: Diagnosis not present

## 2022-01-08 DIAGNOSIS — R0602 Shortness of breath: Secondary | ICD-10-CM | POA: Diagnosis not present

## 2022-01-08 DIAGNOSIS — Z79899 Other long term (current) drug therapy: Secondary | ICD-10-CM | POA: Diagnosis not present

## 2022-01-08 DIAGNOSIS — K5900 Constipation, unspecified: Secondary | ICD-10-CM | POA: Diagnosis not present

## 2022-02-22 DEATH — deceased
